# Patient Record
Sex: Female | Born: 1960 | Race: Black or African American | Hispanic: No | Marital: Married | Smoking: Current some day smoker
Health system: Southern US, Community
[De-identification: ages and names within clinical notes are randomized; demographics above are authoritative.]

## PROBLEM LIST (undated history)

## (undated) DIAGNOSIS — N200 Calculus of kidney: Secondary | ICD-10-CM

## (undated) DIAGNOSIS — I639 Cerebral infarction, unspecified: Secondary | ICD-10-CM

## (undated) DIAGNOSIS — E039 Hypothyroidism, unspecified: Secondary | ICD-10-CM

## (undated) DIAGNOSIS — I509 Heart failure, unspecified: Secondary | ICD-10-CM

## (undated) DIAGNOSIS — Z8489 Family history of other specified conditions: Secondary | ICD-10-CM

## (undated) DIAGNOSIS — F329 Major depressive disorder, single episode, unspecified: Secondary | ICD-10-CM

## (undated) DIAGNOSIS — G43909 Migraine, unspecified, not intractable, without status migrainosus: Secondary | ICD-10-CM

## (undated) DIAGNOSIS — F3289 Other specified depressive episodes: Secondary | ICD-10-CM

## (undated) DIAGNOSIS — M329 Systemic lupus erythematosus, unspecified: Secondary | ICD-10-CM

## (undated) DIAGNOSIS — E7800 Pure hypercholesterolemia, unspecified: Secondary | ICD-10-CM

## (undated) DIAGNOSIS — I10 Essential (primary) hypertension: Secondary | ICD-10-CM

## (undated) HISTORY — DX: Pure hypercholesterolemia, unspecified: E78.00

## (undated) HISTORY — DX: Migraine, unspecified, not intractable, without status migrainosus: G43.909

## (undated) HISTORY — DX: Essential (primary) hypertension: I10

## (undated) HISTORY — DX: Hypothyroidism, unspecified: E03.9

## (undated) HISTORY — DX: Other specified depressive episodes: F32.89

## (undated) HISTORY — DX: Systemic lupus erythematosus, unspecified: M32.9

## (undated) HISTORY — DX: Calculus of kidney: N20.0

## (undated) HISTORY — PX: JOINT REPLACEMENT: SHX530

## (undated) HISTORY — DX: Cerebral infarction, unspecified: I63.9

## (undated) HISTORY — PX: ECTOPIC PREGNANCY SURGERY: SHX613

## (undated) HISTORY — PX: LAPAROSCOPY: SHX197

## (undated) HISTORY — DX: Major depressive disorder, single episode, unspecified: F32.9

## (undated) HISTORY — PX: TONSILLECTOMY: SUR1361

---

## 1983-07-08 HISTORY — PX: TUBAL LIGATION: SHX77

## 1999-01-25 ENCOUNTER — Ambulatory Visit (HOSPITAL_COMMUNITY): Admission: RE | Admit: 1999-01-25 | Discharge: 1999-01-25 | Payer: Self-pay | Admitting: *Deleted

## 1999-09-09 ENCOUNTER — Ambulatory Visit (HOSPITAL_COMMUNITY): Admission: RE | Admit: 1999-09-09 | Discharge: 1999-09-09 | Payer: Self-pay | Admitting: Family Medicine

## 1999-10-06 ENCOUNTER — Emergency Department (HOSPITAL_COMMUNITY): Admission: EM | Admit: 1999-10-06 | Discharge: 1999-10-06 | Payer: Self-pay | Admitting: Emergency Medicine

## 2000-04-27 ENCOUNTER — Encounter: Payer: Self-pay | Admitting: Family Medicine

## 2000-04-27 ENCOUNTER — Ambulatory Visit (HOSPITAL_COMMUNITY): Admission: RE | Admit: 2000-04-27 | Discharge: 2000-04-27 | Payer: Self-pay | Admitting: Family Medicine

## 2000-08-14 ENCOUNTER — Emergency Department (HOSPITAL_COMMUNITY): Admission: EM | Admit: 2000-08-14 | Discharge: 2000-08-14 | Payer: Self-pay | Admitting: Emergency Medicine

## 2000-09-22 ENCOUNTER — Observation Stay (HOSPITAL_COMMUNITY): Admission: RE | Admit: 2000-09-22 | Discharge: 2000-09-22 | Payer: Self-pay | Admitting: Neurological Surgery

## 2000-09-22 ENCOUNTER — Encounter: Payer: Self-pay | Admitting: Neurological Surgery

## 2001-02-23 ENCOUNTER — Encounter: Payer: Self-pay | Admitting: Allergy and Immunology

## 2001-02-23 ENCOUNTER — Encounter: Admission: RE | Admit: 2001-02-23 | Discharge: 2001-02-23 | Payer: Self-pay | Admitting: *Deleted

## 2001-06-25 ENCOUNTER — Other Ambulatory Visit: Admission: RE | Admit: 2001-06-25 | Discharge: 2001-06-25 | Payer: Self-pay | Admitting: Gynecology

## 2001-07-07 HISTORY — PX: LUMBAR DISC SURGERY: SHX700

## 2001-07-09 ENCOUNTER — Encounter: Admission: RE | Admit: 2001-07-09 | Discharge: 2001-07-09 | Payer: Self-pay | Admitting: Endocrinology

## 2001-07-09 ENCOUNTER — Encounter: Payer: Self-pay | Admitting: Endocrinology

## 2002-06-27 ENCOUNTER — Other Ambulatory Visit: Admission: RE | Admit: 2002-06-27 | Discharge: 2002-06-27 | Payer: Self-pay | Admitting: Gynecology

## 2002-07-07 HISTORY — PX: TOTAL ABDOMINAL HYSTERECTOMY: SHX209

## 2002-07-18 ENCOUNTER — Inpatient Hospital Stay (HOSPITAL_COMMUNITY): Admission: RE | Admit: 2002-07-18 | Discharge: 2002-07-20 | Payer: Self-pay | Admitting: Gynecology

## 2002-07-18 ENCOUNTER — Encounter (INDEPENDENT_AMBULATORY_CARE_PROVIDER_SITE_OTHER): Payer: Self-pay | Admitting: Specialist

## 2003-01-08 ENCOUNTER — Emergency Department (HOSPITAL_COMMUNITY): Admission: EM | Admit: 2003-01-08 | Discharge: 2003-01-08 | Payer: Self-pay | Admitting: Emergency Medicine

## 2004-01-11 ENCOUNTER — Emergency Department (HOSPITAL_COMMUNITY): Admission: EM | Admit: 2004-01-11 | Discharge: 2004-01-11 | Payer: Self-pay | Admitting: Emergency Medicine

## 2004-01-17 ENCOUNTER — Encounter: Admission: RE | Admit: 2004-01-17 | Discharge: 2004-04-16 | Payer: Self-pay | Admitting: Neurology

## 2005-11-04 ENCOUNTER — Other Ambulatory Visit: Admission: RE | Admit: 2005-11-04 | Discharge: 2005-11-04 | Payer: Self-pay | Admitting: Family Medicine

## 2006-01-15 ENCOUNTER — Ambulatory Visit: Payer: Self-pay | Admitting: Family Medicine

## 2006-02-02 ENCOUNTER — Ambulatory Visit: Payer: Self-pay | Admitting: Family Medicine

## 2006-03-12 ENCOUNTER — Ambulatory Visit: Payer: Self-pay | Admitting: Family Medicine

## 2006-04-10 ENCOUNTER — Ambulatory Visit: Payer: Self-pay | Admitting: Family Medicine

## 2006-04-23 ENCOUNTER — Ambulatory Visit: Payer: Self-pay | Admitting: Family Medicine

## 2006-04-27 ENCOUNTER — Encounter: Admission: RE | Admit: 2006-04-27 | Discharge: 2006-04-27 | Payer: Self-pay | Admitting: Family Medicine

## 2006-05-27 ENCOUNTER — Encounter (INDEPENDENT_AMBULATORY_CARE_PROVIDER_SITE_OTHER): Payer: Self-pay | Admitting: Specialist

## 2006-05-27 ENCOUNTER — Ambulatory Visit (HOSPITAL_COMMUNITY): Admission: RE | Admit: 2006-05-27 | Discharge: 2006-05-27 | Payer: Self-pay | Admitting: *Deleted

## 2006-06-02 ENCOUNTER — Ambulatory Visit: Payer: Self-pay | Admitting: Family Medicine

## 2006-06-09 ENCOUNTER — Ambulatory Visit: Payer: Self-pay | Admitting: Family Medicine

## 2006-07-20 ENCOUNTER — Encounter: Admission: RE | Admit: 2006-07-20 | Discharge: 2006-07-20 | Payer: Self-pay | Admitting: Family Medicine

## 2006-07-20 ENCOUNTER — Ambulatory Visit: Payer: Self-pay | Admitting: Family Medicine

## 2006-07-28 ENCOUNTER — Ambulatory Visit: Payer: Self-pay | Admitting: Family Medicine

## 2006-08-06 ENCOUNTER — Ambulatory Visit: Payer: Self-pay | Admitting: Family Medicine

## 2006-09-22 ENCOUNTER — Ambulatory Visit: Payer: Self-pay | Admitting: Family Medicine

## 2006-10-01 ENCOUNTER — Ambulatory Visit: Payer: Self-pay | Admitting: Family Medicine

## 2006-11-19 ENCOUNTER — Ambulatory Visit: Payer: Self-pay | Admitting: Family Medicine

## 2007-03-04 ENCOUNTER — Emergency Department (HOSPITAL_COMMUNITY): Admission: EM | Admit: 2007-03-04 | Discharge: 2007-03-04 | Payer: Self-pay | Admitting: Emergency Medicine

## 2007-03-09 ENCOUNTER — Ambulatory Visit: Payer: Self-pay | Admitting: Family Medicine

## 2007-04-13 ENCOUNTER — Ambulatory Visit: Payer: Self-pay | Admitting: Family Medicine

## 2007-04-14 ENCOUNTER — Encounter: Admission: RE | Admit: 2007-04-14 | Discharge: 2007-04-14 | Payer: Self-pay | Admitting: Family Medicine

## 2007-05-31 ENCOUNTER — Ambulatory Visit: Payer: Self-pay | Admitting: Family Medicine

## 2007-10-06 ENCOUNTER — Ambulatory Visit: Payer: Self-pay | Admitting: Family Medicine

## 2007-11-22 ENCOUNTER — Emergency Department (HOSPITAL_COMMUNITY): Admission: EM | Admit: 2007-11-22 | Discharge: 2007-11-23 | Payer: Self-pay | Admitting: Emergency Medicine

## 2007-12-06 ENCOUNTER — Ambulatory Visit: Payer: Self-pay | Admitting: Family Medicine

## 2008-02-15 ENCOUNTER — Ambulatory Visit: Payer: Self-pay | Admitting: Family Medicine

## 2008-05-01 ENCOUNTER — Ambulatory Visit: Payer: Self-pay | Admitting: Family Medicine

## 2008-05-31 ENCOUNTER — Other Ambulatory Visit: Admission: RE | Admit: 2008-05-31 | Discharge: 2008-05-31 | Payer: Self-pay | Admitting: Obstetrics and Gynecology

## 2008-06-09 ENCOUNTER — Encounter: Admission: RE | Admit: 2008-06-09 | Discharge: 2008-06-09 | Payer: Self-pay | Admitting: Obstetrics and Gynecology

## 2008-08-08 ENCOUNTER — Ambulatory Visit: Payer: Self-pay | Admitting: Family Medicine

## 2008-10-03 ENCOUNTER — Ambulatory Visit: Payer: Self-pay | Admitting: Family Medicine

## 2008-10-31 ENCOUNTER — Ambulatory Visit: Payer: Self-pay | Admitting: Family Medicine

## 2009-03-20 ENCOUNTER — Ambulatory Visit: Payer: Self-pay | Admitting: Family Medicine

## 2009-03-22 ENCOUNTER — Encounter: Admission: RE | Admit: 2009-03-22 | Discharge: 2009-03-22 | Payer: Self-pay | Admitting: Family Medicine

## 2009-03-27 LAB — HM COLONOSCOPY: HM Colonoscopy: NEGATIVE

## 2009-04-02 ENCOUNTER — Encounter: Admission: RE | Admit: 2009-04-02 | Discharge: 2009-04-02 | Payer: Self-pay | Admitting: Endocrinology

## 2009-04-17 ENCOUNTER — Ambulatory Visit: Payer: Self-pay | Admitting: Family Medicine

## 2009-04-24 ENCOUNTER — Ambulatory Visit: Payer: Self-pay | Admitting: Family Medicine

## 2009-05-30 ENCOUNTER — Ambulatory Visit: Payer: Self-pay | Admitting: Family Medicine

## 2009-06-11 ENCOUNTER — Encounter: Admission: RE | Admit: 2009-06-11 | Discharge: 2009-06-11 | Payer: Self-pay | Admitting: Endocrinology

## 2009-06-21 ENCOUNTER — Ambulatory Visit (HOSPITAL_COMMUNITY): Admission: RE | Admit: 2009-06-21 | Discharge: 2009-06-21 | Payer: Self-pay | Admitting: Endocrinology

## 2009-07-16 ENCOUNTER — Encounter: Admission: RE | Admit: 2009-07-16 | Discharge: 2009-07-16 | Payer: Self-pay | Admitting: Obstetrics and Gynecology

## 2009-07-16 LAB — HM MAMMOGRAPHY: HM Mammogram: NEGATIVE

## 2009-09-15 ENCOUNTER — Emergency Department (HOSPITAL_COMMUNITY): Admission: EM | Admit: 2009-09-15 | Discharge: 2009-09-16 | Payer: Self-pay | Admitting: Emergency Medicine

## 2010-05-02 ENCOUNTER — Ambulatory Visit: Payer: Self-pay | Admitting: Family Medicine

## 2010-07-19 ENCOUNTER — Encounter
Admission: RE | Admit: 2010-07-19 | Discharge: 2010-07-19 | Payer: Self-pay | Source: Home / Self Care | Attending: Endocrinology | Admitting: Endocrinology

## 2010-10-26 ENCOUNTER — Encounter: Payer: Self-pay | Admitting: Family Medicine

## 2010-11-22 NOTE — H&P (Signed)
pNAMEANIAS, CUTCHIN                         ACCOUNT NO.:  [id number]   MEDICAL RECORD NO.:  [id number]                   PATIENT TYPE:   LOCATION:                                       FACILITY:  [facility]   PHYSICIAN:  Juan H. Toney Rakes, M.D.             DATE OF BIRTH:   DATE OF ADMISSION:  07/18/2002  DATE OF DISCHARGE:                                HISTORY & PHYSICAL   CHIEF COMPLAINT:  Menorrhagia and pelvic pain.   HISTORY:  The patient is a 50 year old gravida 61, para 1, Ab3, who was seen  in the office for annual gynecological examination on December 22nd, but she  had been complaining of and seen before on numerous occasions secondary to  complaints of longstanding history of dysmenorrhea and menorrhagia with  periods lasting up to 10 days.  She has had a trial of different  nonsteroidals in the past as well as narcotics without resolution of her  symptoms.  She frequently feels bloated and at times, she has complained of  dyspareunia, but no postcoital bleeding was reported.  She has had one  cesarean section and has had two ectopic pregnancies and one D&C; one of the  ectopics had resulted in a right cornual resection with salpingectomy and  she had a sterilization procedure of the left remaining tube.  She had a  recent endometrial biopsy with no evidence of hyperplasia and she has been  under the care of Dr. Viona Gilmore. Fabiola Backer for her hypothyroidism.  When she was seen  on December 22nd, she was complaining of yellowish-green discharge which  turned out to be trichomoniasis and subsequently was treated with Flagyl;  she also had GC and Chlamydia cultures which were negative.  On followup  visit for her preop appointment on January 5th, she had a test of cure with  a wet prep essentially unremarkable.   ALLERGIES:  She denies any allergies with the exception of questionable IVP  DYE, BETADINE.   PAST MEDICAL HISTORY:  She has had two ectopic pregnancies, both on the  right,  and had one D&C.  One of the ectopics resulted in the right cornual  resection with salpingectomy.  She also has had a tubal sterilization on the  left.  She has had a prior cesarean section.  She has history of  hypothyroidism for which she is under the care Dr. Wilson Singer.  She is on  Synthroid 0.1 mg q.d., recently tested by Dr. Wilson Singer; according to patient,  it was normal.  She also had an HIV and RPR at the time that she had the  positive Trichomonas and they were negative.   REVIEW OF SYSTEMS:  Nothing unusual with the exception of the items that  were described above.  She did have some form of back surgery in 2001.   PHYSICAL EXAMINATION:  VITAL SIGNS:  The patient weighs 154 pounds.  Blood  pressure 120/76.  HEENT:  Unremarkable.  NECK:  Neck supple.  Trachea midline.  No carotid bruits.  No thyromegaly.  LUNGS:  Lungs are clear to auscultation without rhonchi or wheezes.  HEART:  Regular rate and rhythm.  No murmurs or gallops.  BREASTS:  Exam done at the time of the annual exam was reported to be  normal.  ABDOMEN:  Abdomen was soft and nontender without rebound or guarding.  PELVIC:  Bartholin's, urethra and Skene glands are within normal limits.  Vagina and cervix:  No gross lesions on inspection.  On followup visit,  uterus upper limits of normal, approximately 10 weeks' size.  No palpable  adnexal masses.  RECTAL:  Patient flat out refused.   ACCESSORY CLINICAL DATA:  Last Pap smear in December of 2003 was reported to  be normal.  The patient has been very adamant on screening mammogram.   ASSESSMENT:  [age]year-old gravida 4, para 1, abortus 3, with  progressive dysmenorrhea and menorrhagia unresponsive to medical therapy,  requesting permanent corrective treatment.  We discussed abdominal  hysterectomy with ovarian conservation.  This approach was due to the fact  that she has had a cornual resection in the past and also due to her two  ectopic pregnancies and  cesarean section.  Risks, benefits, pros and cons of  the operation discussed are the following:  The risk of infection for which  she will receive prophylactic antibiotic, the risk of hemorrhage with  possible need for blood and blood products with its potential risk of  anaphylactic reaction, hepatitis and acquired immunodeficiency syndrome were  discussed, also a potential risk of deep venous thrombosis and a subsequent  pulmonary embolism; also intraoperatively, complications such as bladder  laceration, trauma to internal organs or blood vessels with need for  corrective surgery were discussed.  In the event of any suspicious lesion or  involvement of an adnexal structure, we may remove one or both ovaries,  depending on findings at time of surgery.  In the event of a malignancy, a  staging procedure may need to be performed at a later date.  All of this  above were discussed with the patient.  She is also aware that although the  dysmenorrhea and menorrhagia may improve, the lower abdominal bloating and  pain may still persist despite her surgery.  She is fully aware of this and  consciously wants to proceed with the above procedure.  All risks were  outlined and all questions were answered.   PLAN:  Patient is scheduled for total abdominal hysterectomy on Monday,  January 12th, at 7:30 a.m. at [facility].  Please have  history and physical available.                                               Juan H. Toney Rakes, M.D.    JHF/MEDQ  D:  07/17/2002  T:  07/18/2002  Job:  [id number]

## 2010-11-22 NOTE — Op Note (Signed)
[place]  Patient:    Anna [name], Anna [name]                       MRN: [id number] Proc. Date: 09/22/00 Adm. Date:  [id number] Attending:  Clearnce Sorrel                           Operative Report  PREOPERATIVE DIAGNOSIS:  Herniated nucleus pulposus at L4-5 right with right lumbar radiculopathy.  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  PROCEDURE:  Right lumbar microendoscopic diskectomy, L4-5, with operating microscope and microdissection technique.  SURGEON:  Earleen Newport, M.D.  FIRST ASSISTANT:  Hosie Spangle, M.D.  ANESTHESIA:  General endotracheal.  INDICATIONS:  The patient is a 50 year old individual who has had significant back and right lower extremity pain.  She has an extruded fragment of disk on the right side at L4-5 that has been refractory to conservative management for a period of six weeks.  She was advised regarding surgical intervention.  DESCRIPTION OF PROCEDURE:  The patient was brought to the operating room supine on the stretcher.  After the smooth induction of general endotracheal anesthesia, she was placed prone and the back was shaved, prepped with Duraprep, and draped in a sterile fashion.  The fluoroscopy unit was then brought into the field to mark the L4-5 interspace in the AP plane, and then the lateral projection a similar space was identified.  The area of the skin over the right side was infiltrated with 1% lidocaine and epinephrine for a total of 3 cc.  A small vertical incision was made in this area measuring 15 mm in length and then a K-wire was passed through the laminar arch of L4. Then using a wanding technique, a series of dilators was placed over the K-wire to the 18 mm diameter.  An 18 mm x 5 cm deep cannula was then inserted over the L4-5 interspace on the right side and locked to the table frame.  The operating microscope was  then draped and brought into the field, and the soft tissues overlying the laminar arch were cleared with the monopolar cautery and Anspach drill, and a 2.8 mm dissecting tool was used to remove the inferior margin of the lamina of L4 out to the mesial wall of the facet.  Redundant yellow ligament in this area was taken up with 2 and 3 mm Kerrison punch.  The common dural tube was exposed, and the takeoff of the L5 nerve root was noted to be bowed dorsally.  The epidural veins in this area were dissected and cauterized using microdissection technique and then by gently mobilizing the sac and the L5 nerve root, the underlying mass was uncovered.  This was found to be a rather large, singular extruded fragment of disk, which was removed. Once this was removed, there was immediate decompression of the area of the nerve root.  The nerve was sounded out distally out into the foramen, and no other fragments of disk were identified.  The annular ligament was felt to be intact, and there was a small rent medially.  This was not explored, and the area around this area was cauterized with the bipolar cautery.  The decompression thus being completed, the area was irrigated copiously with antibiotic irrigating solution.  Then the endoscopic cannula was removed and fascia was closed with 3-0 Vicryl in interrupted fashion,  with 3-0 Vicryl in the subcuticular and skin.  The patient tolerated the procedure well and returned to the recovery room in stable condition. DD:  09/22/00 TD:  09/22/00 Job: [id number]

## 2010-11-22 NOTE — Op Note (Signed)
Anna [name], Anna [name]                        ACCOUNT NO.:  [id number]   MEDICAL RECORD NO.:  [id number]                   PATIENT TYPE:  INP   LOCATION:  9399                                 FACILITY:  [place]   PHYSICIAN:  [place]. Toney Rakes, M.D.             DATE OF BIRTH:  September 04, 1960   DATE OF PROCEDURE:  07/18/2002  DATE OF DISCHARGE:                                 OPERATIVE REPORT   INDICATIONS FOR PROCEDURE:  A 50 year old female with previous history of  two ectopic pregnancies resulting in formal resection of the right fallopian  tube and also a transection of the left fallopian tube, previous cesarean  section and one D&C. A patient with chronic dysmenorrhea, menorrhagia and  dyspareunia.   PREOPERATIVE DIAGNOSES:  1. Chronic pelvic pain.  2. Dysmenorrhea.  3. Menorrhagia.   POSTOPERATIVE DIAGNOSES:  1. Chronic pelvic pain.  2. Dysmenorrhea.  3. Menorrhagia.   ANESTHESIA:  General endotracheal anesthesia.   SURGEON:  Juan H. Toney Rakes, M.D.   FIRST ASSISTANT:  Rodolph Bong, M.D.   PROCEDURE:  Total abdominal hysterectomy with lysis of pelvic adhesions.   FINDINGS:  1. Previous right salpingectomy.  2. Previous segmental resection of left fallopian tube.  3. Normal appearing ovaries.  4. Pelvic adhesions.   DESCRIPTION OF PROCEDURE:  After the patient was adequately counseled, she  was taken to the operating room where she underwent a successful general  endotracheal anesthesia. Her abdomen and vagina were prepped and draped in  the usual sterile fashion. The patient received a gram of Cefotan  prophylactically and she had pneumatic compression stockings for DVT  prophylaxis. After the abdomen was prepped and draped in the usual sterile  fashion and the drapes were in place, a Pfannenstiel skin incision was made  over the area of previous Pfannenstiel incision. The incision was carried  down from the skin and subcutaneous tissue down to the rectus fascia  and  divided in the midline. A nick was made, the midline raphe was entered and  the peritoneal cavity was entered cautiously. The patient was placed in  slight Trendelenburg. O'Connor-O'Sullivan retractors were in place. Pelvic  inspection demonstrated some pelvic adhesions from the previous right  salpingectomy which were meticulously lysed otherwise no evidence of  endometriosis or any other abnormality. The right round ligament was  identified and was suture ligated with #0 Vicryl suture at its distal  portion and it was transected whereby the anterior broad ligament was  incised to the level of the anterior cervical os. The right ureter was  identified with the surgeon's finger and posterior broad ligament was  penetrated and the utero-ovarian ligament was clamped, cut and suture  ligated with #0 suture thus leaving the right ovary in place. After  meticulous dissection of the perimetrium a similar procedure was carried on  the contralateral side. The cardinal and broad ligament was serially  clamped, cut and suture ligated with #  0 Vicryl suture to the level both  lateral fornices which were cut and suture ligated with #0 Vicryl suture and  the angles were kept under tension with Kelly clamps. Compression with  hemostats. The remainder of the cervix was then removed from the vagina and  passed off the operative fields, weight was 200 grams.  Both angles were  secured with #0 Vicryl suture and the remaining cuff was secured with a  transfixation #0 suture. Both ovaries were then suspended with #0 Vicryl  sutures to the round ligament. The pelvic cavity was copiously irrigated  with normal saline solution. The sponge, needle and instrument counts were  correct. The O'Connor-O'Sullivan retractors removed. The visceral peritoneum  was not reapproximated and the rectus fascia was closed with #0 Vicryl  suture. The subcutaneous bleeders were both cauterized and the skin was  reapproximated  with staples. A Xeroform gauze was placed and the patient was  extubated and transferred to the recovery room with stable vital signs.  Blood loss for the procedure was 20 cc, IV fluid was 2 liters of lactated  Ringer's, urine output was 120 cc.                                               Juan H. Toney Rakes, M.D.    JHF/MEDQ  D:  07/18/2002  T:  07/18/2002  Job:  [id number]

## 2010-11-22 NOTE — Discharge Summary (Signed)
   Anna [name], Anna [name]                        ACCOUNT NO.:  [id number]   MEDICAL RECORD NO.:  [id number]                   PATIENT TYPE:  INP   LOCATION:  9309                                 FACILITY:  [place]   PHYSICIAN:  Arne Cleveland, P.A.              DATE OF BIRTH:  Aug 17, 1960   DATE OF ADMISSION:  07/18/2002  DATE OF DISCHARGE:  07/20/2002                                 DISCHARGE SUMMARY   DISCHARGE DIAGNOSES:  1. Chronic pelvic pain.  2. Dysmenorrhea.  3. Menorrhagia.  4. Status post total abdominal hysterectomy with lysis of pelvic adhesions     by Dr. Uvaldo Rising on July 18, 2002.   HISTORY:  A 41-years-of-age female gravida 4 para 1 aborta 3 who complained  of increasing amount of chronic pelvic pain, dysmenorrhea, and menorrhagia.  She had had a trial of different nonsteroidals in the past as well as  narcotics without resolution of her symptoms.  She had a history of one  prior cesarean section, two ectopic pregnancies, and one D&C, and a prior  tubal ligation.   HOSPITAL COURSE:  On July 18, 2002 the patient was admitted and underwent  a total abdominal hysterectomy with lysis of adhesions by Dr. Uvaldo Rising.  There were no complications.  Postoperatively the patient  remained afebrile, voiding, in stable condition, and on July 20, 2002 was  felt in satisfactory condition and was discharged home.   DISCHARGE INSTRUCTIONS:  Given [facility] Gynecology instructions.   ACCESSORY CLINICAL FINDINGS/LABORATORY DATA:  On July 19, 2002 hemoglobin  11.4.   DISPOSITION:  The patient is discharged to home on July 20, 2002.   MEDICATIONS:  Prescription for Tylox p.r.n. pain.   FOLLOW-UP:  She was to follow up in the next week to remove her staples.  If  she had any problem prior to that time to be seen in the office.                                               Arne Cleveland, P.A.    TSG/MEDQ  D:  08/16/2002  T:  08/16/2002  Job:   [id number]

## 2010-11-22 NOTE — Op Note (Signed)
NAMETEE, NEVE              ACCOUNT NO.:  [id number]   MEDICAL RECORD NO.:  [id number]          PATIENT TYPE:  AMB   LOCATION:  ENDO                         FACILITY:  [place]   PHYSICIAN:  Waverly Ferrari, M.D.    DATE OF BIRTH:  May 08, 1961   DATE OF PROCEDURE:  DATE OF DISCHARGE:                                 OPERATIVE REPORT   PROCEDURE:  Upper endoscopy.   INDICATIONS:  See previous clinical notes dictated.   ANESTHESIA:  Fentanyl 75 mcg, Versed 6 mg.   PROCEDURE:  With the patient mildly sedated in the left lateral decubitus  position, the Olympus videoscopic endoscope was inserted in the mouth and  passed under direct vision through the esophagus which appeared normal.  There was no evidence of Barrett's esophagus.  We entered into the stomach,  and the fundus, body, antrum, duodenal bulb, second portion of the duodenum  were visualized.  From this point the endoscope was slowly withdrawn taking  circumferential views of duodenal mucosa until the endoscope had been pulled  back into the stomach,  placed in retroflexion to view the stomach from  below.  The endoscope was then straightened and withdrawn taking  circumferential views of the remaining gastric and esophageal mucosa,  stopping in the antrum where a fairly mild minimal erythema was seen,  photographed and biopsied.  The patient's vital signs, pulse oximetry  remained stable.  The patient tolerated the procedure well without apparent  complications.   FINDINGS:  Minimal erythema of antrum, biopsied.  Await biopsy report.  The  patient will call me for results and follow-up with me as an outpatient.   ASSESSMENT:  Assessment at this point this is essentially a very benign-  appearing endoscopic examination with certainly no evidence of cause for the  patient to have hematemesis as the patient reports and clearly and no  evidence of any disabling illness.           ______________________________  Waverly Ferrari, M.D.     GMO/MEDQ  D:  05/27/2006  T:  05/27/2006  Job:  [id number]   cc:   Jill Alexanders, M.D.

## 2010-11-27 ENCOUNTER — Encounter: Payer: Self-pay | Admitting: Family Medicine

## 2010-11-27 ENCOUNTER — Ambulatory Visit (INDEPENDENT_AMBULATORY_CARE_PROVIDER_SITE_OTHER): Payer: BC Managed Care – PPO | Admitting: Family Medicine

## 2010-11-27 VITALS — BP 110/70 | HR 72 | Wt 158.0 lb

## 2010-11-27 DIAGNOSIS — E039 Hypothyroidism, unspecified: Secondary | ICD-10-CM | POA: Insufficient documentation

## 2010-11-27 DIAGNOSIS — M329 Systemic lupus erythematosus, unspecified: Secondary | ICD-10-CM | POA: Insufficient documentation

## 2010-11-27 DIAGNOSIS — F32A Depression, unspecified: Secondary | ICD-10-CM | POA: Insufficient documentation

## 2010-11-27 DIAGNOSIS — E785 Hyperlipidemia, unspecified: Secondary | ICD-10-CM | POA: Insufficient documentation

## 2010-11-27 DIAGNOSIS — Z79899 Other long term (current) drug therapy: Secondary | ICD-10-CM | POA: Insufficient documentation

## 2010-11-27 DIAGNOSIS — F329 Major depressive disorder, single episode, unspecified: Secondary | ICD-10-CM

## 2010-11-27 LAB — CBC WITH DIFFERENTIAL/PLATELET
Basophils Absolute: 0 10*3/uL (ref 0.0–0.1)
Eosinophils Relative: 2 % (ref 0–5)
Lymphocytes Relative: 44 % (ref 12–46)
Neutro Abs: 2.1 10*3/uL (ref 1.7–7.7)
Neutrophils Relative %: 44 % (ref 43–77)
Platelets: 329 10*3/uL (ref 150–400)
RDW: 15.3 % (ref 11.5–15.5)
WBC: 4.7 10*3/uL (ref 4.0–10.5)

## 2010-11-27 LAB — COMPREHENSIVE METABOLIC PANEL
CO2: 22 mEq/L (ref 19–32)
Glucose, Bld: 92 mg/dL (ref 70–99)
Sodium: 141 mEq/L (ref 135–145)
Total Bilirubin: 0.2 mg/dL — ABNORMAL LOW (ref 0.3–1.2)
Total Protein: 6.6 g/dL (ref 6.0–8.3)

## 2010-11-27 LAB — LIPID PANEL
Cholesterol: 200 mg/dL (ref 0–200)
HDL: 41 mg/dL (ref >39)
Triglycerides: 90 mg/dL (ref <150)

## 2010-11-27 NOTE — Patient Instructions (Addendum)
Call your rheumatologist concerning your lupus flare. We'll work on smoking the next time you come in. Stay on your other medications

## 2010-11-27 NOTE — Progress Notes (Signed)
  Subjective:    Patient ID: Anna [name], female    DOB: 1960/11/25, 50 y.o.   MRN: [id number]  HPI she is here for a recheck. She continues to be followed by her psychiatrist, Dr. Candis Schatz who is monitoring her psychotropic medications. She has had difficulty recently with SLE and presently is on 5 mg of prednisone. She still is having some swelling especially in her face. She continues on her thyroid medication. She has been under a lot of stress dealing with her underlying medical conditions as well as trying to apply for disability.  Her husband also recently lost his job. She does continue to smoke and is not interested in quitting.    Review of Systems Negative except as above    Objective:   Physical Exam alert and in no distress but tearful. The left side of her face just lateral to the eye is slightly swollen.        Assessment & Plan:  See chronic problem list Encouraged her to call her rheumatologist concerning her SLE. She will continue to be followed by her psychiatrist. Routine blood screening. Followup here in several months.

## 2010-11-28 ENCOUNTER — Telehealth: Payer: Self-pay

## 2010-11-28 LAB — TSH: TSH: 0.732 u[IU]/mL (ref 0.350–4.500)

## 2010-11-28 NOTE — Telephone Encounter (Signed)
Called pt to let her know THS is good but cholesterol isnt sending diet info

## 2010-12-12 ENCOUNTER — Emergency Department (HOSPITAL_COMMUNITY): Payer: BC Managed Care – PPO

## 2010-12-12 ENCOUNTER — Emergency Department (HOSPITAL_COMMUNITY)
Admission: EM | Admit: 2010-12-12 | Discharge: 2010-12-12 | Disposition: A | Discharge status: Home / Self Care | Payer: BC Managed Care – PPO | Attending: Emergency Medicine | Admitting: Emergency Medicine

## 2010-12-12 DIAGNOSIS — R300 Dysuria: Secondary | ICD-10-CM | POA: Insufficient documentation

## 2010-12-12 DIAGNOSIS — R4182 Altered mental status, unspecified: Secondary | ICD-10-CM | POA: Insufficient documentation

## 2010-12-12 DIAGNOSIS — I10 Essential (primary) hypertension: Secondary | ICD-10-CM | POA: Insufficient documentation

## 2010-12-12 DIAGNOSIS — R109 Unspecified abdominal pain: Secondary | ICD-10-CM | POA: Insufficient documentation

## 2010-12-12 DIAGNOSIS — Z8673 Personal history of transient ischemic attack (TIA), and cerebral infarction without residual deficits: Secondary | ICD-10-CM | POA: Insufficient documentation

## 2010-12-12 DIAGNOSIS — R404 Transient alteration of awareness: Secondary | ICD-10-CM | POA: Insufficient documentation

## 2010-12-12 DIAGNOSIS — Z79899 Other long term (current) drug therapy: Secondary | ICD-10-CM | POA: Insufficient documentation

## 2010-12-12 DIAGNOSIS — M329 Systemic lupus erythematosus, unspecified: Secondary | ICD-10-CM | POA: Insufficient documentation

## 2010-12-12 LAB — POCT I-STAT, CHEM 8
BUN: 18 mg/dL (ref 6–23)
Potassium: 4.2 mEq/L (ref 3.5–5.1)
Sodium: 138 mEq/L (ref 135–145)
TCO2: 24 mmol/L (ref 0–100)

## 2010-12-12 LAB — ACETAMINOPHEN LEVEL: Acetaminophen (Tylenol), Serum: <15 ug/mL (ref 10–30)

## 2010-12-12 LAB — DIFFERENTIAL
Basophils Absolute: 0 10*3/uL (ref 0.0–0.1)
Basophils Relative: 0 % (ref 0–1)
Lymphocytes Relative: 21 % (ref 12–46)
Monocytes Relative: 7 % (ref 3–12)
Neutro Abs: 6.7 10*3/uL (ref 1.7–7.7)
Neutrophils Relative %: 71 % (ref 43–77)

## 2010-12-12 LAB — SALICYLATE LEVEL
Salicylate Lvl: <2 mg/dL — ABNORMAL LOW (ref 2.8–20.0)
Salicylate Lvl: <2 mg/dL — ABNORMAL LOW (ref 2.8–20.0)

## 2010-12-12 LAB — RAPID URINE DRUG SCREEN, HOSP PERFORMED: Barbiturates: NOT DETECTED

## 2010-12-12 LAB — URINALYSIS, ROUTINE W REFLEX MICROSCOPIC
Glucose, UA: NEGATIVE mg/dL
pH: 5.5 (ref 5.0–8.0)

## 2010-12-12 LAB — CBC
Hemoglobin: 14.7 g/dL (ref 12.0–15.0)
MCH: 34.9 pg — ABNORMAL HIGH (ref 26.0–34.0)
RBC: 4.21 MIL/uL (ref 3.87–5.11)

## 2010-12-13 LAB — URINE CULTURE
Colony Count: NO GROWTH
Culture  Setup Time: 201206071228

## 2010-12-25 ENCOUNTER — Ambulatory Visit (INDEPENDENT_AMBULATORY_CARE_PROVIDER_SITE_OTHER): Payer: BC Managed Care – PPO | Admitting: Family Medicine

## 2010-12-25 ENCOUNTER — Encounter: Payer: Self-pay | Admitting: Family Medicine

## 2010-12-25 VITALS — BP 104/70 | HR 84 | Ht 66.5 in | Wt 156.0 lb

## 2010-12-25 DIAGNOSIS — R42 Dizziness and giddiness: Secondary | ICD-10-CM

## 2010-12-25 DIAGNOSIS — I10 Essential (primary) hypertension: Secondary | ICD-10-CM | POA: Insufficient documentation

## 2010-12-25 NOTE — Patient Instructions (Signed)
Decrease your Benicar HCT to 1/2 tablet every day (instead of taking a full tablet).  Make sure you are drinking at least 8 eight ounce glasses of fluid (ie water) daily.  Try and check your blood pressure at pharmacy a few times between now and your next visit (in 2 weeks)

## 2010-12-25 NOTE — Progress Notes (Signed)
Subjective:    Patient ID: Anna [name], female    DOB: October 07, 1960, 50 y.o.   MRN: [id number]  HPI Patient walks in today with BP concerns.  Has been feeling foggy, woozy, like she's going to pass out.  Did faint a few weeks ago while in [employer].  She had ER visit a couple of weeks ago related to somnolence (noted at her rheumatologist visit).  Work-up was normal (other than benzo's on tox screen, she takes chronically), and she was sent home.  Patient states that BP was low (80/50).  She stopped taking the Benicar HCT for 2 days after ER visit, and when she checked BP at pharmacy she recalls that the diastolic was [id number].  She restarted the Benicar HCT at that time, realizing that her BP was too high off the medication.  BP at Dr. Trudie Reed office this morning was 98/66.  She comes directly from Dr. Trudie Reed office.  She states that perhaps she isn't drinking enough fluids.  She's been sweating heavily due to hot flashes/menopausal symptoms.  Past Medical History  Diagnosis Date  . Hypertension   . Hypothyroid   . Lupus (systemic lupus erythematosus)   . Depressive disorder, not elsewhere classified   . Pure hypercholesterolemia   . Kidney stone     Past Surgical History  Procedure Date  . Abdominal hysterectomy   . Cesarean section   . Tonsillectomy   . Lumbar disc surgery 2003  . Ectopic pregnancy surgery     History   Social History  . Marital Status: Married    Spouse Name: N/A    Number of Children: N/A  . Years of Education: N/A   Occupational History  . Not on file.   Social History Main Topics  . Smoking status: Current Everyday Smoker -- 0.5 packs/day for 35 years    Types: Cigarettes  . Smokeless tobacco: Never Used  . Alcohol Use: Yes     maybe 5 drinks per year  . Drug Use: No  . Sexually Active: Not on file   Other Topics Concern  . Not on file   Social History Narrative  . No narrative on file    Family History  Problem Relation Age of Onset  .  Autoimmune disease Neg Hx     Current outpatient prescriptions:carbamazepine (TEGRETOL) 200 MG tablet, Take 200 mg by mouth 3 (three) times daily.  , Disp: , Rfl: ;  diazepam (VALIUM) 5 MG tablet, Take 5 mg by mouth every 6 (six) hours as needed.  , Disp: , Rfl: ;  escitalopram (LEXAPRO) 20 MG tablet, Take 20 mg by mouth daily.  , Disp: , Rfl: ;  hydroxychloroquine (PLAQUENIL) 200 MG tablet, Take 200 mg by mouth 2 (two) times daily.  , Disp: , Rfl:  hydrOXYzine (ATARAX) 10 MG tablet, Take 10 mg by mouth 3 (three) times daily as needed. Once in the am 2 pills at night, Disp: , Rfl: ;  levothyroxine (SYNTHROID, LEVOTHROID) 50 MCG tablet, Take 75 mcg by mouth daily. , Disp: , Rfl: ;  olmesartan-hydrochlorothiazide (BENICAR HCT) 20-12.5 MG per tablet, Take 1 tablet by mouth daily.  , Disp: , Rfl: ;  predniSONE (DELTASONE) 5 MG tablet, Take 5 mg by mouth daily.  , Disp: , Rfl:  QUEtiapine (SEROQUEL) 100 MG tablet, Take 100-150 mg by mouth at bedtime.  , Disp: , Rfl:   No Known Allergies  Review of Systems + hot flashes.  Denies fevers.  Denies URI symptoms,  slight cough from smoking.  Denies any feet swelling.  No chest pain or shortness of breath    Objective:   Physical Exam BP 104/70  Pulse 84  Ht 5' 6.5" (1.689 m)  Wt 156 lb (70.761 kg)  BMI 24.80 kg/m2 Well developed African American female in no distress HEENT: PERRL, EOMI, mucus membranes moist, but lips appear dry Neck:  No lymphadenopathy or masses Heart:  Regular rate and rhythm without murmurs Lungs:  Clear bilaterally Extremities:  No clubbing, cyanosis or edema     Assessment & Plan:   1. Essential hypertension, benign   2. Dizziness    Her symptoms are likely related to low blood pressures, partially related to mild dehydration.  This may be related to the heat of summer, plus her menopausal hot flashes, along with inadequate fluid intake.  She drank 2 large glasses of water prior to leaving the office and felt much  better.  She is to decrease her Benicar HCT to 1/2 tablet daily, increase her fluid intake, and follow-up in 2 weeks for re-check of blood pressure.

## 2011-01-09 ENCOUNTER — Encounter: Payer: Self-pay | Admitting: Family Medicine

## 2011-01-09 ENCOUNTER — Ambulatory Visit (INDEPENDENT_AMBULATORY_CARE_PROVIDER_SITE_OTHER): Payer: BC Managed Care – PPO | Admitting: Family Medicine

## 2011-01-09 VITALS — BP 112/74 | HR 76 | Ht 66.6 in | Wt 159.0 lb

## 2011-01-09 DIAGNOSIS — I10 Essential (primary) hypertension: Secondary | ICD-10-CM

## 2011-01-09 NOTE — Progress Notes (Signed)
Subjective:    Patient ID: Anna [name], female    DOB: 1960/12/24, 50 y.o.   MRN: [id number]  HPI Patient presents for follow up on her blood pressure.  At last visit, she was complaining of a lot of dizziness, and syncope.  We decreased her Benicar HCT to just 1/2 pill daily.  She doesn't check her blood pressure elsewhere, but feels like her blood pressure is doing better.  She still feels "foggy", lightheaded.  She recently saw Dr. Tressia Danas assistant (PA? NP?), and was started on Neurontin. She was told this might help with her hot flashes. She feels like all of her symptoms are related to her menopause.  Has been drinking a lot of water.  Suffers from constipation, but finally had a bowel movement, which wore her out (needed MOM, prune juice--is out of Amitiza until she gets her check)  Has a lot of stress--son's illness (in hospital in [place], with no money for gas to go visit him), financial and marital stress, along with the 100 degree weather and hot flashes.  Drinking plenty of fluids.  Past Medical History  Diagnosis Date  . Hypertension   . Hypothyroid   . Lupus (systemic lupus erythematosus)   . Depressive disorder, not elsewhere classified   . Pure hypercholesterolemia   . Kidney stone     Past Surgical History  Procedure Date  . Abdominal hysterectomy   . Cesarean section   . Tonsillectomy   . Lumbar disc surgery 2003  . Ectopic pregnancy surgery     History   Social History  . Marital Status: Married    Spouse Name: N/A    Number of Children: N/A  . Years of Education: N/A   Occupational History  . Not on file.   Social History Main Topics  . Smoking status: Current Everyday Smoker -- 0.5 packs/day for 35 years    Types: Cigarettes  . Smokeless tobacco: Never Used  . Alcohol Use: Yes     maybe 5 drinks per year  . Drug Use: No  . Sexually Active: Not on file   Other Topics Concern  . Not on file   Social History Narrative  . No narrative on file     Family History  Problem Relation Age of Onset  . Autoimmune disease Neg Hx     Current outpatient prescriptions:AMITIZA 24 MCG capsule, Take 48 mcg by mouth daily with breakfast. , Disp: , Rfl: ;  carbamazepine (TEGRETOL) 200 MG tablet, Take 200 mg by mouth 3 (three) times daily.  , Disp: , Rfl: ;  diazepam (VALIUM) 5 MG tablet, Take 5 mg by mouth every 6 (six) hours as needed.  , Disp: , Rfl: ;  escitalopram (LEXAPRO) 20 MG tablet, Take 20 mg by mouth daily.  , Disp: , Rfl:  gabapentin (NEURONTIN) 300 MG capsule, Take 300 mg by mouth at bedtime. , Disp: , Rfl: ;  hydroxychloroquine (PLAQUENIL) 200 MG tablet, Take 200 mg by mouth 2 (two) times daily.  , Disp: , Rfl: ;  levothyroxine (SYNTHROID) 75 MCG tablet, Take 75 mcg by mouth daily.  , Disp: , Rfl: ;  olmesartan-hydrochlorothiazide (BENICAR HCT) 20-12.5 MG per tablet, Take 0.5 tablets by mouth daily. , Disp: , Rfl:  predniSONE (DELTASONE) 5 MG tablet, Take 5 mg by mouth daily.  , Disp: , Rfl: ;  QUEtiapine (SEROQUEL) 100 MG tablet, Take 100-150 mg by mouth at bedtime.  , Disp: , Rfl: ;  DISCONTD: levothyroxine (SYNTHROID, LEVOTHROID)  50 MCG tablet, Take 75 mcg by mouth daily. , Disp: , Rfl: ;  hydrOXYzine (ATARAX) 10 MG tablet, Take 10 mg by mouth 3 (three) times daily as needed. Once in the am 2 pills at night, Disp: , Rfl:   No Known Allergies  Review of Systems Goiter--sometimes has choking, but doing better as long as she drinks slower.  Has appt scheduled with Dr. Wilson Singer next week.  Slight headache today.  Denies URI symptoms, just slight nasal congestion.  +smoker's cough.  Denies swelling, rash, or other complaints    Objective:   Physical Exam BP 112/74  Pulse 76  Ht 5' 6.6" (1.692 m)  Wt 159 lb (72.122 kg)  BMI 25.20 kg/m2 Well developed, pleasant african [place] female who is clearly distraught today.  She unloaded about all of her stressors and what's going on in her life, and stated she felt better after speaking with me.   She appears somewhat down, but exhibits full range of affect today as well.  Normal eye contact, speech. Neck: Goiter (diffusely, mildly enlarged thyroid without discrete mass), no lymphadenopathy Heart:  Regular rate and rhythm without murmur Lungs: clear bilaterally Extremities: no edema Skin: no rash       Assessment & Plan:   1. Essential hypertension, benign    Continue Benicar HCT at 1/2 tablet.  Encouraged to quit smoking  Continue with counseling/seeing psychiatrist.  Has f/u with Dr. Wilson Singer 7/10 to f/u goiter.  F/u here in 6 months on HTN, sooner prn

## 2011-01-09 NOTE — Patient Instructions (Addendum)
Make sure to let your psychiatrist know that you have been started on the new medication from your neurologist (the Gabapentin).  Continue taking Benicar HCT at HALF-TABLET (not whole pill).  Your blood pressure was very good today.  Continue to drink lots of fluids.   Return here in 6 months, sooner if you continue to have problems/concerns

## 2011-01-14 ENCOUNTER — Other Ambulatory Visit: Payer: Self-pay | Admitting: Endocrinology

## 2011-01-14 DIAGNOSIS — E042 Nontoxic multinodular goiter: Secondary | ICD-10-CM

## 2011-01-18 ENCOUNTER — Other Ambulatory Visit: Payer: Self-pay | Admitting: Family Medicine

## 2011-04-02 LAB — CBC
MCHC: 34.4
MCV: 96.6
Platelets: 280

## 2011-04-02 LAB — POCT I-STAT, CHEM 8
BUN: 14
Creatinine, Ser: 1.1
Glucose, Bld: 98
Hemoglobin: 12.6
Potassium: 3.6

## 2011-04-02 LAB — POCT CARDIAC MARKERS: Myoglobin, poc: 35.7

## 2011-04-02 LAB — DIFFERENTIAL
Basophils Relative: 1
Eosinophils Absolute: 0.1
Monocytes Relative: 7
Neutrophils Relative %: 58

## 2011-04-15 DIAGNOSIS — Z0271 Encounter for disability determination: Secondary | ICD-10-CM

## 2011-04-17 ENCOUNTER — Encounter: Payer: Self-pay | Admitting: Family Medicine

## 2011-04-17 ENCOUNTER — Ambulatory Visit (INDEPENDENT_AMBULATORY_CARE_PROVIDER_SITE_OTHER): Payer: BC Managed Care – PPO | Admitting: Family Medicine

## 2011-04-17 VITALS — BP 132/86 | HR 72 | Temp 97.7°F | Ht 66.0 in | Wt 168.0 lb

## 2011-04-17 DIAGNOSIS — I10 Essential (primary) hypertension: Secondary | ICD-10-CM

## 2011-04-17 DIAGNOSIS — M329 Systemic lupus erythematosus, unspecified: Secondary | ICD-10-CM

## 2011-04-17 DIAGNOSIS — J309 Allergic rhinitis, unspecified: Secondary | ICD-10-CM

## 2011-04-17 DIAGNOSIS — J329 Chronic sinusitis, unspecified: Secondary | ICD-10-CM

## 2011-04-17 MED ORDER — FLUTICASONE PROPIONATE 50 MCG/ACT NA SUSP: 2.0000 | Freq: Every day | NASAL | Status: DC

## 2011-04-17 MED ORDER — OLMESARTAN MEDOXOMIL-HCTZ 20-12.5 MG PO TABS: ORAL_TABLET | ORAL | Status: DC

## 2011-04-17 MED ORDER — AMOXICILLIN 875 MG PO TABS: 875.0000 mg | ORAL_TABLET | Freq: Two times a day (BID) | ORAL | Status: AC

## 2011-04-17 NOTE — Patient Instructions (Signed)
Take all the antibiotics for your sinus infection. Quitting smoking will help prevent future sinus infections Sinus rinses will help flush the sinuses Restart nasal steroids to treat allergies (new prescription sent for Flonase)  Cut back on alcohol intake Start walking every day Talk with Dr. Trudie Reed about pain control

## 2011-04-17 NOTE — Progress Notes (Signed)
Patient presents for f/u hypertension.  BP's at [facility] running 140's/90's.  Was even higher at [facility].  Wondering if machines are accurate. Feeling "fuzzy".  Denies headaches or chest pain.  Has metallic taste in her mouth and coughing up some yellow phlegm for a few weeks.  Some sinus pressure (frontal) at night, and also some postnasal drip. + sick contacts.  Using Robitussin DM with some improvement.  She continues to smoke.  Increased alcohol intake since the weather changed, about 2-3 weeks ago.  Admits to having 2 drinks once daily or every other day, depending on who visits.  Seems to help keep her joint pain under control  Got flu shot at [facility] Son is doing much better Had corneal abrasion and infection (cut self with fingernail)--treated by Dr. Zenia Resides partner, and infection resolved.  Past Medical History  Diagnosis Date  . Hypertension   . Hypothyroid   . Lupus (systemic lupus erythematosus)   . Depressive disorder, not elsewhere classified   . Pure hypercholesterolemia   . Kidney stone     Past Surgical History  Procedure Date  . Abdominal hysterectomy   . Cesarean section   . Tonsillectomy   . Lumbar disc surgery 2003  . Ectopic pregnancy surgery     History   Social History  . Marital Status: Married    Spouse Name: N/A    Number of Children: N/A  . Years of Education: N/A   Occupational History  . Not on file.   Social History Main Topics  . Smoking status: Current Everyday Smoker -- 0.5 packs/day for 35 years    Types: Cigarettes  . Smokeless tobacco: Never Used  . Alcohol Use: Yes     2 drinks, every day or every other day--admits to using to help with her pain  . Drug Use: No  . Sexually Active: Not on file   Other Topics Concern  . Not on file   Social History Narrative  . No narrative on file    Family History  Problem Relation Age of Onset  . Autoimmune disease Neg Hx    Current Outpatient Prescriptions on File Prior to Visit  Medication  Sig Dispense Refill  . BENICAR HCT 20-12.5 MG per tablet TAKE 1 TABLET EVERY DAY  30 tablet  1  . carbamazepine (TEGRETOL) 200 MG tablet Take 200 mg by mouth 3 (three) times daily.        . diazepam (VALIUM) 5 MG tablet Take 5 mg by mouth every 6 (six) hours as needed.        . gabapentin (NEURONTIN) 300 MG capsule Take 300 mg by mouth at bedtime.       . hydroxychloroquine (PLAQUENIL) 200 MG tablet Take 200 mg by mouth 2 (two) times daily.        [name] levothyroxine (SYNTHROID) 75 MCG tablet Take 75 mcg by mouth daily.        . predniSONE (DELTASONE) 5 MG tablet Take 5 mg by mouth daily.        . QUEtiapine (SEROQUEL) 100 MG tablet Take 100-150 mg by mouth at bedtime.        . AMITIZA 24 MCG capsule Take 48 mcg by mouth daily with breakfast.       . hydrOXYzine (ATARAX) 10 MG tablet Take 10 mg by mouth 3 (three) times daily as needed. Once in the am 2 pills at night       No Known Allergies  ROS:  +headaches (just  the sinus, with weather changes); no fevers.  No GI complaints, numbness, tingling, weakness. +joint aches with colder weather.  Denies chest pain, shortness of breath.  9 pound weight gain Having some thyroid issues--is having studies done in December (ultrasound?)  PHYSICAL EXAM: BP 132/86  Pulse 72  Temp(Src) 97.7 F (36.5 C) (Oral)  Ht 5\' 6"  (1.676 m)  Wt 168 lb (76.204 kg)  BMI 27.12 kg/m2 Well developed, pleasant female, in no distress HEENT: PERRL, EOMI, conjunctiva clear.  Mod-severe nasal mucosa edema, with + erythema and yellow crusting.  Sinuses nontender.  Op clear. TM's normal Neck: +goiter, no lymphadenopathy Heart: regular rate and rhythm Lungs: clear bilaterally Abdomen: soft, nontender, no organomegaly or mass Extremities: no edema Skin: no rash  ASSESSMENT/PLAN:  1. Sinusitis  amoxicillin (AMOXIL) 875 MG tablet  2. Allergic rhinitis, cause unspecified  fluticasone (FLONASE) 50 MCG/ACT nasal spray  3. Essential hypertension, benign    4. SLE (systemic  lupus erythematosus)     Sinusitis--encouraged her to quit smoking.  Discussed sinus rinses. HTN--adequately controlled on current regimen Weight gain--Start walking, cut back on alcohol Increasing alcohol use as way of self medicating her increased joint pains related to colder weather.  Discussed risks of smoking, and encouraged her to cut back. F/u with Dr. Trudie Reed next week, and discuss pain control with her

## 2011-04-18 LAB — CBC
HCT: 37.2
MCHC: 34.5
MCV: 96.4
Platelets: 247
RDW: 14.6 — ABNORMAL HIGH

## 2011-04-18 LAB — BASIC METABOLIC PANEL
BUN: 11
CO2: 26
Chloride: 105
GFR calc non Af Amer: >60
Glucose, Bld: 124 — ABNORMAL HIGH
Potassium: 3.4 — ABNORMAL LOW

## 2011-04-18 LAB — URINALYSIS, ROUTINE W REFLEX MICROSCOPIC
Bilirubin Urine: NEGATIVE
Ketones, ur: 15 — AB
Leukocytes, UA: NEGATIVE
Nitrite: NEGATIVE
Protein, ur: 30 — AB

## 2011-04-18 LAB — DIFFERENTIAL
Basophils Absolute: 0
Basophils Relative: 0
Eosinophils Absolute: 0
Eosinophils Relative: 0
Monocytes Absolute: 0.2

## 2011-05-30 ENCOUNTER — Ambulatory Visit: Payer: BC Managed Care – PPO | Admitting: Family Medicine

## 2011-06-04 ENCOUNTER — Telehealth: Payer: Self-pay | Admitting: Family Medicine

## 2011-06-04 NOTE — Telephone Encounter (Signed)
FAXED

## 2011-07-04 ENCOUNTER — Ambulatory Visit
Admission: RE | Admit: 2011-07-04 | Discharge: 2011-07-04 | Disposition: A | Discharge status: Home / Self Care | Payer: BC Managed Care – PPO | Source: Ambulatory Visit | Attending: Endocrinology | Admitting: Endocrinology

## 2011-07-04 DIAGNOSIS — E042 Nontoxic multinodular goiter: Secondary | ICD-10-CM

## 2011-07-14 ENCOUNTER — Ambulatory Visit: Payer: BC Managed Care – PPO | Admitting: Family Medicine

## 2011-07-15 ENCOUNTER — Other Ambulatory Visit: Payer: Self-pay | Admitting: Obstetrics and Gynecology

## 2011-07-15 DIAGNOSIS — N63 Unspecified lump in unspecified breast: Secondary | ICD-10-CM

## 2011-07-25 ENCOUNTER — Other Ambulatory Visit: Payer: BC Managed Care – PPO

## 2011-07-31 ENCOUNTER — Other Ambulatory Visit: Payer: BC Managed Care – PPO

## 2011-08-08 ENCOUNTER — Ambulatory Visit
Admission: RE | Admit: 2011-08-08 | Discharge: 2011-08-08 | Disposition: A | Discharge status: Home / Self Care | Payer: BC Managed Care – PPO | Source: Ambulatory Visit | Attending: Obstetrics and Gynecology | Admitting: Obstetrics and Gynecology

## 2011-08-08 DIAGNOSIS — N63 Unspecified lump in unspecified breast: Secondary | ICD-10-CM

## 2011-08-14 ENCOUNTER — Encounter: Payer: Self-pay | Admitting: Internal Medicine

## 2011-08-20 ENCOUNTER — Encounter: Payer: Self-pay | Admitting: Family Medicine

## 2011-08-20 ENCOUNTER — Ambulatory Visit (INDEPENDENT_AMBULATORY_CARE_PROVIDER_SITE_OTHER): Payer: BC Managed Care – PPO | Admitting: Family Medicine

## 2011-08-20 DIAGNOSIS — M329 Systemic lupus erythematosus, unspecified: Secondary | ICD-10-CM

## 2011-08-20 DIAGNOSIS — I10 Essential (primary) hypertension: Secondary | ICD-10-CM

## 2011-08-20 DIAGNOSIS — E042 Nontoxic multinodular goiter: Secondary | ICD-10-CM

## 2011-08-20 DIAGNOSIS — Z79899 Other long term (current) drug therapy: Secondary | ICD-10-CM

## 2011-08-20 NOTE — Patient Instructions (Signed)
Use Tylenol 2 pills 4 times a day for the pain.

## 2011-08-20 NOTE — Progress Notes (Signed)
  Subjective:    Patient ID: Anna [name], female    DOB: 1961-05-21, 51 y.o.   MRN: [id number]  HPI She is here for an interval evaluation. She saw her rheumatologist today because of increased difficulty from her SLE causing various aches and pains specifically in her shoulders and neck area. Her prednisone was increased. She has been using 2 Tylenol twice per day for the pain and is asking for more pain medication. She continues on other medications listed in the chart. Earlier this year she did have an ultrasound done which did show stable multinodular goiter. She continues on Synthroid. She was seen by neurology in October. That note was reviewed. She continues to be followed by her psychiatrist. She states she has cut back on her alcohol consumption having only one drink per month. She continues to smoke.   Review of Systems Negative except as above    Objective:   Physical Exam alert and in no distress. Tympanic membranes and canals are normal. Throat is clear. Tonsils are normal. Neck is supple without adenopathy or thyromegaly. Cardiac exam shows a regular sinus rhythm without murmurs or gallops. Lungs are clear to auscultation. DTRs are normal.       Assessment & Plan:   1. SLE (systemic lupus erythematosus)   2. Essential hypertension, benign   3. Encounter for long-term (current) use of other medications   4. Multinodular goiter    encouraged her to increase her Tylenol to 2 tablets 4 times per day and continued difficulty with pain, call her rheumatologist. I will order TSH as the other blood work was ordered by her rheumatologist

## 2011-08-21 LAB — TSH: TSH: 0.278 u[IU]/mL — ABNORMAL LOW (ref 0.350–4.500)

## 2011-11-04 ENCOUNTER — Other Ambulatory Visit: Payer: Self-pay | Admitting: Family Medicine

## 2011-12-03 ENCOUNTER — Ambulatory Visit (INDEPENDENT_AMBULATORY_CARE_PROVIDER_SITE_OTHER): Payer: BC Managed Care – PPO | Admitting: Family Medicine

## 2011-12-03 ENCOUNTER — Encounter: Payer: Self-pay | Admitting: Family Medicine

## 2011-12-03 VITALS — BP 122/76 | HR 72 | Temp 98.4°F | Ht 66.0 in | Wt 169.0 lb

## 2011-12-03 DIAGNOSIS — M79602 Pain in left arm: Secondary | ICD-10-CM

## 2011-12-03 DIAGNOSIS — W19XXXA Unspecified fall, initial encounter: Secondary | ICD-10-CM

## 2011-12-03 DIAGNOSIS — M79609 Pain in unspecified limb: Secondary | ICD-10-CM

## 2011-12-03 NOTE — Progress Notes (Signed)
Chief Complaint  Patient presents with  . Arm Pain    fell in yard yesterday while watering rose bushes and trying to spray a feral cat. She fell and rolled down a very steep hill and her left arm from shoulder to wrist is injured. It "clicks" when she tries to turn it.    HPI:  See above--she rolled down the hill and landed on top of L arm.  Has pain shooting down from the L elbow all the way to the hand.  She reports that the forearm felt very warm yesterday.  She made a makeshift sling which she used yesterday.  Has a pulling sensation down the radial aspect of her arm from elbow.  Denies any numbness or tingling.  Denies weakness, just pain.  Took Bayer back and body yesterday and this morning, but it didn't help.  Past Medical History  Diagnosis Date  . Hypertension   . Hypothyroid   . Lupus (systemic lupus erythematosus)   . Depressive disorder, not elsewhere classified   . Pure hypercholesterolemia   . Kidney stone    Past Surgical History  Procedure Date  . Abdominal hysterectomy   . Cesarean section   . Tonsillectomy   . Lumbar disc surgery 2003  . Ectopic pregnancy surgery    History   Social History  . Marital Status: Married    Spouse Name: N/A    Number of Children: N/A  . Years of Education: N/A   Occupational History  . Not on file.   Social History Main Topics  . Smoking status: Current Everyday Smoker -- 1.0 packs/day for 35 years    Types: Cigarettes  . Smokeless tobacco: Never Used  . Alcohol Use: Yes     wine once a month.  . Drug Use: No  . Sexually Active: Not on file   Other Topics Concern  . Not on file   Social History Narrative  . No narrative on file   Current Outpatient Prescriptions on File Prior to Visit  Medication Sig Dispense Refill  . BENICAR HCT 20-12.5 MG per tablet TAKE 1 TABLET EVERY DAY  30 tablet  prn  . carbamazepine (TEGRETOL) 200 MG tablet Take 200 mg by mouth 3 (three) times daily.        . hydroxychloroquine  (PLAQUENIL) 200 MG tablet Take 200 mg by mouth 2 (two) times daily.        . hydrOXYzine (ATARAX) 10 MG tablet Take 10 mg by mouth 3 (three) times daily as needed. Once in the am 2 pills at night      . levothyroxine (SYNTHROID) 75 MCG tablet Take 75 mcg by mouth daily.        . predniSONE (DELTASONE) 5 MG tablet Take 10 mg by mouth daily.       . QUEtiapine (SEROQUEL) 100 MG tablet Take 100-150 mg by mouth at bedtime.        . gabapentin (NEURONTIN) 300 MG capsule Take 300 mg by mouth at bedtime.       Also takes another medication--she didn't bring it and doesn't remember the name, takes it for pain, three times daily.  No Known Allergies  ROS:  Denies fevers, bleeding/bruising, other pain or injuries.  PHYSICAL EXAM: BP 122/76  Pulse 72  Temp(Src) 98.4 F (36.9 C) (Oral)  Ht 5\' 6"  (1.676 m)  Wt 169 lb (76.658 kg)  BMI 27.28 kg/m2 Patient appears in moderate discomfort, holding her left arm with her right.  Very  dramatic and in pain during exam Affect is unusual (using a lot of sarcasm).  Very tender at distal L radius.  No swelling, warmth.  Some pain in medial forearm. nontender at elbow.  Limited ROM of pronation supination, mainly due to pain. 2+ pulse, brisk capillary refill  X-rays--normal without evidence of fracture.  ASSESSMENT/PLAN: 1. Fall  DG Forearm Left  2. Arm pain, left  DG Forearm Left   L wrist and arm pain s/p fall yesterday. Negative x-ray, so likely just bruised/contusion.  Recommend sling--for just short-term use. Follow up here or with ortho if ongoing/worsening pain.  Pt states she has a pain med at home (not on her med list), which she hasn't been able to take today (couldn't open bottle).  Take for pain, and if ineffective, call with name of medication so we can determine what else to use for pain.  Encouraged to quit smoking.

## 2011-12-03 NOTE — Patient Instructions (Signed)
Your x-ray does not show any fracture of your forearm or wrist. I recommend using ice and/or heat to the arm over the next few days.  Don't use anti-inflammatories since you are on prednisone (the combination could give you ulcers).  Tylenol products are okay.  You said you had another pain medication at home.  If that isn't working, call us with the name, and if needed we can change your pain medications.  You may use a sling--but do not use it all day long.  Try and do some range of motion exercises to prevent stiffness.

## 2011-12-12 ENCOUNTER — Ambulatory Visit (INDEPENDENT_AMBULATORY_CARE_PROVIDER_SITE_OTHER): Payer: BC Managed Care – PPO | Admitting: Family Medicine

## 2011-12-12 ENCOUNTER — Encounter: Payer: Self-pay | Admitting: Family Medicine

## 2011-12-12 VITALS — BP 124/80 | HR 81 | Wt 168.0 lb

## 2011-12-12 DIAGNOSIS — M25539 Pain in unspecified wrist: Secondary | ICD-10-CM

## 2011-12-12 DIAGNOSIS — M25531 Pain in right wrist: Secondary | ICD-10-CM

## 2011-12-12 NOTE — Progress Notes (Signed)
  Subjective:    Patient ID: Anna [name], female    DOB: 05/01/1961, 51 y.o.   MRN: [id number]  HPI For recheck. She fell several days ago and continues to have difficulty with left hand discomfort. She states that she has difficulty turning a a steering wheel as well as with wrist rotation. Difficult to get a good history from her.   Review of Systems     Objective:   Physical Exam Exam of the left wrist shows full motion of the wrist. No swelling or deformity noted of the fingers. Good strength. She does complain of pain with palpation between the third and fourth MCP but no lesions were palpable.      Assessment & Plan:  Wrist pain. Recommend Tylenol regularly and is still having difficulty in one or 2 weeks, call me.

## 2012-02-24 ENCOUNTER — Other Ambulatory Visit: Payer: Self-pay | Admitting: Endocrinology

## 2012-02-24 DIAGNOSIS — E041 Nontoxic single thyroid nodule: Secondary | ICD-10-CM

## 2012-03-25 ENCOUNTER — Telehealth: Payer: Self-pay | Admitting: Family Medicine

## 2012-03-25 ENCOUNTER — Other Ambulatory Visit: Payer: Self-pay | Admitting: *Deleted

## 2012-03-25 DIAGNOSIS — E039 Hypothyroidism, unspecified: Secondary | ICD-10-CM

## 2012-03-25 DIAGNOSIS — Z79899 Other long term (current) drug therapy: Secondary | ICD-10-CM

## 2012-03-25 DIAGNOSIS — I10 Essential (primary) hypertension: Secondary | ICD-10-CM

## 2012-03-25 DIAGNOSIS — E785 Hyperlipidemia, unspecified: Secondary | ICD-10-CM

## 2012-03-25 MED ORDER — OLMESARTAN MEDOXOMIL-HCTZ 20-12.5 MG PO TABS: 1.0000 | ORAL_TABLET | Freq: Every day | ORAL | Status: DC

## 2012-03-25 NOTE — Telephone Encounter (Signed)
Left message for patient letting her know that her samples are up front and ready for pick up. Also asked her to please call office and scheduled fasting med check or med check with labs prior(cmet,lipids,TSH) she can schedule with either Dr.Knapp or Dr.Lalonde.

## 2012-03-25 NOTE — Telephone Encounter (Signed)
Okay for samples. Needs to schedule fasting med check (or med check and come for labs prior)--due for c-met, lipids, TSH (labs last done 11/2010). Looks like she has seen both Dr. Redmond School and myself for med checks; can schedule with either

## 2012-03-26 ENCOUNTER — Other Ambulatory Visit: Payer: BC Managed Care – PPO

## 2012-03-26 DIAGNOSIS — I10 Essential (primary) hypertension: Secondary | ICD-10-CM

## 2012-03-26 DIAGNOSIS — E785 Hyperlipidemia, unspecified: Secondary | ICD-10-CM

## 2012-03-26 DIAGNOSIS — E039 Hypothyroidism, unspecified: Secondary | ICD-10-CM

## 2012-03-26 DIAGNOSIS — Z79899 Other long term (current) drug therapy: Secondary | ICD-10-CM

## 2012-03-26 LAB — LIPID PANEL
Cholesterol: 233 mg/dL — ABNORMAL HIGH (ref 0–200)
Total CHOL/HDL Ratio: 7.3 Ratio
VLDL: 25 mg/dL (ref 0–40)

## 2012-03-26 LAB — COMPREHENSIVE METABOLIC PANEL
ALT: 10 U/L (ref 0–35)
AST: 14 U/L (ref 0–37)
Creat: 1.06 mg/dL (ref 0.50–1.10)
Total Bilirubin: 0.4 mg/dL (ref 0.3–1.2)

## 2012-04-05 ENCOUNTER — Ambulatory Visit (INDEPENDENT_AMBULATORY_CARE_PROVIDER_SITE_OTHER): Payer: BC Managed Care – PPO | Admitting: Family Medicine

## 2012-04-05 VITALS — BP 110/72 | HR 84 | Temp 97.9°F | Ht 66.0 in | Wt 165.0 lb

## 2012-04-05 DIAGNOSIS — E039 Hypothyroidism, unspecified: Secondary | ICD-10-CM

## 2012-04-05 DIAGNOSIS — H6120 Impacted cerumen, unspecified ear: Secondary | ICD-10-CM

## 2012-04-05 DIAGNOSIS — I10 Essential (primary) hypertension: Secondary | ICD-10-CM

## 2012-04-05 DIAGNOSIS — E785 Hyperlipidemia, unspecified: Secondary | ICD-10-CM

## 2012-04-05 DIAGNOSIS — Z23 Encounter for immunization: Secondary | ICD-10-CM

## 2012-04-05 NOTE — Patient Instructions (Addendum)
For constipation, I recommend that you take Miralax every day.  Once you start having regular bowel movements, especially if they become frequent and/or too loose, then cut back on the miralax to just 3x/week. Continue to take stool softeners daily (such as Colace, 2 tablets daily) Make sure you drink plenty of water every day (at least 6-8 glasses daily).  Cholesterol Control Diet Cholesterol levels in your body are determined significantly by your diet. Cholesterol levels may also be related to heart disease. The following material helps to explain this relationship and discusses what you can do to help keep your heart healthy. Not all cholesterol is bad. Low-density lipoprotein (LDL) cholesterol is the "bad" cholesterol. It may cause fatty deposits to build up inside your arteries. High-density lipoprotein (HDL) cholesterol is "good." It helps to remove the "bad" LDL cholesterol from your blood. Cholesterol is a very important risk factor for heart disease. Other risk factors are high blood pressure, smoking, stress, heredity, and weight. The heart muscle gets its supply of blood through the coronary arteries. If your LDL cholesterol is high and your HDL cholesterol is low, you are at risk for having fatty deposits build up in your coronary arteries. This leaves less room through which blood can flow. Without sufficient blood and oxygen, the heart muscle cannot function properly and you may feel chest pains (angina pectoris). When a coronary artery closes up entirely, a part of the heart muscle may die, causing a heart attack (myocardial infarction). CHECKING CHOLESTEROL When your caregiver sends your blood to a lab to be analyzed for cholesterol, a complete lipid (fat) profile may be done. With this test, the total amount of cholesterol and levels of LDL and HDL are determined. Triglycerides are a type of fat that circulates in the blood and can also be used to determine heart disease risk. The list  below describes what the numbers should be: Test: Total Cholesterol.  Less than 200 mg/dl.  Test: LDL "bad cholesterol."  Less than 100 mg/dl.   Less than 70 mg/dl if you are at very high risk of a heart attack or sudden cardiac death.  Test: HDL "good cholesterol."  Greater than 50 mg/dl for women.   Greater than 40 mg/dl for men.  Test: Triglycerides.  Less than 150 mg/dl.  CONTROLLING CHOLESTEROL WITH DIET Although exercise and lifestyle factors are important, your diet is key. That is because certain foods are known to raise cholesterol and others to lower it. The goal is to balance foods for their effect on cholesterol and more importantly, to replace saturated and trans fat with other types of fat, such as monounsaturated fat, polyunsaturated fat, and omega-3 fatty acids. On average, a person should consume no more than 15 to 17 g of saturated fat daily. Saturated and trans fats are considered "bad" fats, and they will raise LDL cholesterol. Saturated fats are primarily found in animal products such as meats, butter, and cream. However, that does not mean you need to sacrifice all your favorite foods. Today, there are good tasting, low-fat, low-cholesterol substitutes for most of the things you like to eat. Choose low-fat or nonfat alternatives. Choose round or loin cuts of red meat, since these types of cuts are lowest in fat and cholesterol. Chicken (without the skin), fish, veal, and ground [place] breast are excellent choices. Eliminate fatty meats, such as hot dogs and salami. Even shellfish have little or no saturated fat. Have a 3 oz (85 g) portion when you eat lean meat,  poultry, or fish. Trans fats are also called "partially hydrogenated oils." They are oils that have been scientifically manipulated so that they are solid at room temperature resulting in a longer shelf life and improved taste and texture of foods in which they are added. Trans fats are found in stick margarine, some  tub margarines, cookies, crackers, and baked goods.  When baking and cooking, oils are an excellent substitute for butter. The monounsaturated oils are especially beneficial since it is believed they lower LDL and raise HDL. The oils you should avoid entirely are saturated tropical oils, such as coconut and palm.  Remember to eat liberally from food groups that are naturally free of saturated and trans fat, including fish, fruit, vegetables, beans, grains (barley, rice, couscous, bulgur wheat), and pasta (without cream sauces).  IDENTIFYING FOODS THAT LOWER CHOLESTEROL  Soluble fiber may lower your cholesterol. This type of fiber is found in fruits such as apples, vegetables such as broccoli, potatoes, and carrots, legumes such as beans, peas, and lentils, and grains such as barley. Foods fortified with plant sterols (phytosterol) may also lower cholesterol. You should eat at least 2 g per day of these foods for a cholesterol lowering effect.  Read package labels to identify low-saturated fats, trans fats free, and low-fat foods at the supermarket. Select cheeses that have only 2 to 3 g saturated fat per ounce. Use a heart-healthy tub margarine that is free of trans fats or partially hydrogenated oil. When buying baked goods (cookies, crackers), avoid partially hydrogenated oils. Breads and muffins should be made from whole grains (whole-wheat or whole oat flour, instead of "flour" or "enriched flour"). Buy non-creamy canned soups with reduced salt and no added fats.  FOOD PREPARATION TECHNIQUES  Never deep-fry. If you must fry, either stir-fry, which uses very little fat, or use non-stick cooking sprays. When possible, broil, bake, or roast meats, and steam vegetables. Instead of dressing vegetables with butter or margarine, use lemon and herbs, applesauce and cinnamon (for squash and sweet potatoes), nonfat yogurt, salsa, and low-fat dressings for salads.  LOW-SATURATED FAT / LOW-FAT FOOD  SUBSTITUTES Meats / Saturated Fat (g)  Avoid: Steak, marbled (3 oz/85 g) / 11 g   Choose: Steak, lean (3 oz/85 g) / 4 g   Avoid: Hamburger (3 oz/85 g) / 7 g   Choose: Hamburger, lean (3 oz/85 g) / 5 g   Avoid: Ham (3 oz/85 g) / 6 g   Choose: Ham, lean cut (3 oz/85 g) / 2.4 g   Avoid: Chicken, with skin, dark meat (3 oz/85 g) / 4 g   Choose: Chicken, skin removed, dark meat (3 oz/85 g) / 2 g   Avoid: Chicken, with skin, light meat (3 oz/85 g) / 2.5 g   Choose: Chicken, skin removed, light meat (3 oz/85 g) / 1 g  Dairy / Saturated Fat (g)  Avoid: Whole milk (1 cup) / 5 g   Choose: Low-fat milk, 2% (1 cup) / 3 g   Choose: Low-fat milk, 1% (1 cup) / 1.5 g   Choose: Skim milk (1 cup) / 0.3 g   Avoid: Hard cheese (1 oz/28 g) / 6 g   Choose: Skim milk cheese (1 oz/28 g) / 2 to 3 g   Avoid: Cottage cheese, 4% fat (1 cup) / 6.5 g   Choose: Low-fat cottage cheese, 1% fat (1 cup) / 1.5 g   Avoid: Ice cream (1 cup) / 9 g   Choose: Sherbet (1 cup) /  2.5 g   Choose: Nonfat frozen yogurt (1 cup) / 0.3 g   Choose: Frozen fruit bar / trace   Avoid: Whipped cream (1 tbs) / 3.5 g   Choose: Nondairy whipped topping (1 tbs) / 1 g  Condiments / Saturated Fat (g)  Avoid: Mayonnaise (1 tbs) / 2 g   Choose: Low-fat mayonnaise (1 tbs) / 1 g   Avoid: Butter (1 tbs) / 7 g   Choose: Extra light margarine (1 tbs) / 1 g   Avoid: Coconut oil (1 tbs) / 11.8 g   Choose: Olive oil (1 tbs) / 1.8 g   Choose: Corn oil (1 tbs) / 1.7 g   Choose: Safflower oil (1 tbs) / 1.2 g   Choose: Sunflower oil (1 tbs) / 1.4 g   Choose: Soybean oil (1 tbs) / 2.4 g   Choose: Canola oil (1 tbs) / 1 g  Document Released: 06/23/2005 Document Revised: 06/12/2011 Document Reviewed: 12/12/2010 [facility] Patient Information 2012 [place], [place].  Quitting smoking and getting exercise daily will help raise the HDL  Try Claritin (loratidine) for your allergy symptoms--this might help with some of the  vertigo/dizziness.

## 2012-04-05 NOTE — Progress Notes (Signed)
Chief Complaint  Patient presents with  . Hypertension    med check-labs done last week.   HPI:  HTN:  Patient reports that her BP's have been running low.  It was low when she last saw Dr. Trudie Reed last week.  She reports being on 1/2 tablet of Benicar HCTZ ever since 12/2010 when she was seen for dizziness. (chart apparently never updated with this correction).  Having ongoing dizziness for about 2 weeks, which she describes as more like vertigo than light-headedness.  Has some swelling off and on in her feet.  Feels like something is stuck in her right ear.  Feels is popping.  Cleaned it with peroxide, wondering if there is wax in there. Some intermittent decreased hearing R ear.  Hyperlipidemia--eating a lot of ice cream, burgers  Past Medical History  Diagnosis Date  . Hypertension   . Hypothyroid   . Lupus (systemic lupus erythematosus)   . Depressive disorder, not elsewhere classified   . Pure hypercholesterolemia   . Kidney stone    Past Surgical History  Procedure Date  . Abdominal hysterectomy   . Cesarean section   . Tonsillectomy   . Lumbar disc surgery 2003  . Ectopic pregnancy surgery    History   Social History  . Marital Status: Married    Spouse Name: N/A    Number of Children: N/A  . Years of Education: N/A   Occupational History  . Not on file.   Social History Main Topics  . Smoking status: Current Every Day Smoker -- 1.0 packs/day for 35 years    Types: Cigarettes  . Smokeless tobacco: Never Used  . Alcohol Use: Yes     wine once a month.  . Drug Use: No  . Sexually Active: Not on file   Other Topics Concern  . Not on file   Social History Narrative  . No narrative on file   Current outpatient prescriptions:carbamazepine (TEGRETOL) 200 MG tablet, Take 200 mg by mouth 3 (three) times daily.  , Disp: , Rfl: ;  diazepam (VALIUM) 10 MG tablet, Take 10 mg by mouth 3 (three) times daily., Disp: , Rfl: ;  escitalopram (LEXAPRO) 20 MG tablet, Take 20  mg by mouth daily., Disp: , Rfl: ;  hydroxychloroquine (PLAQUENIL) 200 MG tablet, Take 200 mg by mouth 2 (two) times daily.  , Disp: , Rfl:  levothyroxine (SYNTHROID) 75 MCG tablet, Take 75 mcg by mouth daily. , Disp: , Rfl: ;  methocarbamol (ROBAXIN) 500 MG tablet, Take 500 mg by mouth 3 (three) times daily., Disp: , Rfl: ;  olmesartan-hydrochlorothiazide (BENICAR HCT) 20-12.5 MG per tablet, Take 1 tablet by mouth daily., Disp: 28 tablet, Rfl: 0;  predniSONE (DELTASONE) 5 MG tablet, Take 10 mg by mouth daily. , Disp: , Rfl:  QUEtiapine (SEROQUEL) 100 MG tablet, Take 100-150 mg by mouth at bedtime.  , Disp: , Rfl:   No Known Allergies  ROS:  Denies fevers, sore throat.  +sinus congestion, sneezing. + cough (smoker's cough), phlegm is sometimes clear, other times yellow.  Denies shortness of breath.  Denies nausea or vomiting.  "I can't poop", last about 4-5 days ago, hard stools, despite MOM, prune juice.  She recalls having pneumovax in 2008.  Declines today--will do next year.  Doesn't recall last tetanus, and declines today.  Willing to get flu shot today   PHYSICAL EXAM: BP 98/60  Pulse 84  Temp 97.9 F (36.6 C) (Oral)  Ht 5\' 6"  (1.676 m)  Wt 165 lb (74.844 kg)  BMI 26.63 kg/m2  118/76 RA 110/72 LA HEENT:  PERRL, EOMI, conjunctiva clear.   Cerumen impaction bilaterally--normal TM's after lavage, EAC's normal OP clear Neck: no lymphadenopathy. +thyromegaly Heart: regular rate and rhythm without murmur Lungs: clear bilaterally Abdomen: soft, nontender, no mass Extremities: No edema Skin: Dry skin throughout. No rash/lesions Psych: somewhat unusual behavior.  Normal mood; strange affect--sometimes joking, other times almost threatening  Recent labs:   Chemistry      Component Value Date/Time   NA 134* 03/26/2012 0823   K 4.5 03/26/2012 0823   CL 102 03/26/2012 0823   CO2 26 03/26/2012 0823   BUN 23 03/26/2012 0823   CREATININE 1.06 03/26/2012 0823   CREATININE 1.10 12/12/2010 1136        Component Value Date/Time   CALCIUM 9.5 03/26/2012 0823   ALKPHOS 82 03/26/2012 0823   AST 14 03/26/2012 0823   ALT 10 03/26/2012 0823   BILITOT 0.4 03/26/2012 0823     Glucose 80  Lab Results  Component Value Date   CHOL 233* 03/26/2012   HDL 32* 03/26/2012   LDLCALC 176* 03/26/2012   TRIG 123 03/26/2012   CHOLHDL 7.3 03/26/2012   Lab Results  Component Value Date   TSH 0.438 03/26/2012   ASSESSMENT/PLAN: 1. Need for prophylactic vaccination and inoculation against influenza  Flu vaccine greater than or equal to 3yo preservative free IM  2. Essential hypertension, benign    3. Hypothyroid    4. Hyperlipidemia    5. Cerumen impaction  Ear cerumen removal   bilateral   HTN--BP a little low, normal on repeat.  Not likely the cause of her symptoms, which seem to be more vertigo than lightheadedness. Dizziness--mostly related to vertigo, not hypotension.  I recommended changing over to Benicar (without the HCT) due to constipation and dry skin,(and slightly low sodium) but she reports intermittent swelling and declines changing medication. Repeat BP was okay--continue on 1/2 tablet daily.  Vertigo--having some allergy symptoms.  Recommended trial of claritin. Cerumen impaction bilaterally--s/p lavage with improvement in impaction.  Still had some decreased hearing related to residual fluid after procedure.  Hyperlipidemia--diet reviewed in detail--patient not very receptive to recommendations.  Discussed that dietary changes were just one way of lowering cholesterol, that medications could also be used, if she is unable to make these changes.  Dietary trial for 6 months.  If LDL significantly >130, consider statin.  Smoking--pt not interested in quitting  Hypothyroidism with goiter--adequately replaced.  Continue current meds  Constipation--increase fiber, fluids, use miralax and stool softeners daily   Denies needing any rx's today.  Recently picked up benicar HCT samples  F/u  6 months (fasting med check, or labs prior--lipids needed)

## 2012-06-02 ENCOUNTER — Other Ambulatory Visit: Payer: Self-pay | Admitting: *Deleted

## 2012-06-02 ENCOUNTER — Telehealth: Payer: Self-pay | Admitting: *Deleted

## 2012-06-02 NOTE — Telephone Encounter (Signed)
Dr.Knapp wrote letter and it was given to pt.

## 2012-06-02 NOTE — Telephone Encounter (Signed)
I cannot write a note stating this, as I haven't addressed her limitations from her lupus at any visit.  I can simply write a note stating that she is under the care of Dr. Trudie Reed for her SLE.  (who will be back Monday, to write note for her, if appropriate)

## 2012-06-02 NOTE — Telephone Encounter (Signed)
Patient called and stated that she needs you to write her a note stating that she cannot work due to her lupus. Her lawyer is requesting this letter by end of today. Her neurologist and her therapist, Dustin Flock have written her letters as well and she is picking up today. Her rheumatologist, Gavin Pound is out of the office until December 2nd, that is why she said she is asking you. She states that this letter is for her court date for her disability. I asked when the court date was and she could not give me an answer. I explained to her that you may not be able to provide this letter by the end of the day as she just called this morning and it is the day before a holiday. I told her I would call her and let her know. Thanks.

## 2012-06-16 ENCOUNTER — Ambulatory Visit
Admission: RE | Admit: 2012-06-16 | Discharge: 2012-06-16 | Disposition: A | Discharge status: Home / Self Care | Payer: BC Managed Care – PPO | Source: Ambulatory Visit | Attending: Endocrinology | Admitting: Endocrinology

## 2012-06-16 DIAGNOSIS — E041 Nontoxic single thyroid nodule: Secondary | ICD-10-CM

## 2012-07-16 ENCOUNTER — Other Ambulatory Visit: Payer: Self-pay | Admitting: Obstetrics and Gynecology

## 2012-07-16 DIAGNOSIS — N6320 Unspecified lump in the left breast, unspecified quadrant: Secondary | ICD-10-CM

## 2012-07-26 ENCOUNTER — Ambulatory Visit
Admission: RE | Admit: 2012-07-26 | Discharge: 2012-07-26 | Disposition: A | Discharge status: Home / Self Care | Payer: BC Managed Care – PPO | Source: Ambulatory Visit | Attending: Obstetrics and Gynecology | Admitting: Obstetrics and Gynecology

## 2012-07-26 DIAGNOSIS — N6320 Unspecified lump in the left breast, unspecified quadrant: Secondary | ICD-10-CM

## 2012-09-08 ENCOUNTER — Telehealth: Payer: Self-pay | Admitting: Internal Medicine

## 2012-09-08 NOTE — Telephone Encounter (Signed)
Faxed over medical records to office of disability  Adjudication and review [address]

## 2012-09-16 ENCOUNTER — Other Ambulatory Visit: Payer: Self-pay | Admitting: Family Medicine

## 2012-09-27 ENCOUNTER — Other Ambulatory Visit: Payer: BC Managed Care – PPO

## 2012-09-28 ENCOUNTER — Telehealth: Payer: Self-pay | Admitting: Family Medicine

## 2012-09-28 ENCOUNTER — Other Ambulatory Visit: Payer: BC Managed Care – PPO

## 2012-09-28 DIAGNOSIS — E785 Hyperlipidemia, unspecified: Secondary | ICD-10-CM

## 2012-09-28 NOTE — Telephone Encounter (Signed)
Dr. Tomi Bamberger this patient came in this morning for lab work and there was not any order's in the system. I had Denice Paradise draw the blood. She has extra tubes. There was a note in the computer about Lipids but I just want to make sure you didn't need any other labs on her. Thanks

## 2012-09-28 NOTE — Telephone Encounter (Signed)
Cheri also sent me messaage--just lipids are due.  Thanks for checking

## 2012-09-29 ENCOUNTER — Telehealth: Payer: Self-pay | Admitting: *Deleted

## 2012-09-29 LAB — LIPID PANEL
Cholesterol: 213 mg/dL — ABNORMAL HIGH (ref 0–200)
VLDL: 21 mg/dL (ref 0–40)

## 2012-09-30 ENCOUNTER — Ambulatory Visit (INDEPENDENT_AMBULATORY_CARE_PROVIDER_SITE_OTHER): Payer: BC Managed Care – PPO | Admitting: Family Medicine

## 2012-09-30 ENCOUNTER — Encounter: Payer: Self-pay | Admitting: Family Medicine

## 2012-09-30 VITALS — BP 138/88 | HR 84 | Ht 66.0 in | Wt 171.0 lb

## 2012-09-30 DIAGNOSIS — F17200 Nicotine dependence, unspecified, uncomplicated: Secondary | ICD-10-CM

## 2012-09-30 DIAGNOSIS — E785 Hyperlipidemia, unspecified: Secondary | ICD-10-CM

## 2012-09-30 DIAGNOSIS — E039 Hypothyroidism, unspecified: Secondary | ICD-10-CM

## 2012-09-30 DIAGNOSIS — Z9181 History of falling: Secondary | ICD-10-CM

## 2012-09-30 DIAGNOSIS — I10 Essential (primary) hypertension: Secondary | ICD-10-CM

## 2012-09-30 DIAGNOSIS — R296 Repeated falls: Secondary | ICD-10-CM

## 2012-09-30 NOTE — Patient Instructions (Addendum)
We are referring you to physical therapy to help with balance and reduce falls. I would like to refer you to nutritionist to help with your blood pressure and cholesterol.  Call and let me know if you change your mind--I STRONGLY encourage it.  Here is some information to help you make adjustments in your diet if you are trying it alone.  You need to cut back on fast food, red meats, creamy sauces, dressings, soups, ice cream, butter and cheese.  Eat more fish, chicken/poultry, cook with olive oil instead of butter.  Eat more vegetables.  Use lowfat or nonfat dairy products (milk); try sherbert instead of ice cream (or at least a low fat ice cream, and only small portions).  Cut back on the salt in your diet (sodium--in fast foods, canned foods, etc--see below).  This contributes to elevated blood pressure.  Check blood pressure at pharmacy, and keep a list--bring list to your next visit.  Fat and Cholesterol Control Diet Cholesterol levels in your body are determined significantly by your diet. Cholesterol levels may also be related to heart disease. The following material helps to explain this relationship and discusses what you can do to help keep your heart healthy. Not all cholesterol is bad. Low-density lipoprotein (LDL) cholesterol is the "bad" cholesterol. It may cause fatty deposits to build up inside your arteries. High-density lipoprotein (HDL) cholesterol is "good." It helps to remove the "bad" LDL cholesterol from your blood. Cholesterol is a very important risk factor for heart disease. Other risk factors are high blood pressure, smoking, stress, heredity, and weight. The heart muscle gets its supply of blood through the coronary arteries. If your LDL cholesterol is high and your HDL cholesterol is low, you are at risk for having fatty deposits build up in your coronary arteries. This leaves less room through which blood can flow. Without sufficient blood and oxygen, the heart muscle cannot  function properly and you may feel chest pains (angina pectoris). When a coronary artery closes up entirely, a part of the heart muscle may die causing a heart attack (myocardial infarction). CHECKING CHOLESTEROL When your caregiver sends your blood to a lab to be examined for cholesterol, a complete lipid (fat) profile may be done. With this test, the total amount of cholesterol and levels of LDL and HDL are determined. Triglycerides are a type of fat that circulates in the blood. They can also be used to determine heart disease risk. The list below describes what the numbers should be: Test: Total Cholesterol.  Less than 200 mg/dl. Test: LDL "bad cholesterol."  Less than 100 mg/dl.  Less than 70 mg/dl if you are at very high risk of a heart attack or sudden cardiac death. Test: HDL "good cholesterol."  Greater than 50 mg/dl for women.  Greater than 40 mg/dl for men. Test: Triglycerides.  Less than 150 mg/dl. CONTROLLING CHOLESTEROL WITH DIET Although exercise and lifestyle factors are important, your diet is key. That is because certain foods are known to raise cholesterol and others to lower it. The goal is to balance foods for their effect on cholesterol and more importantly, to replace saturated and trans fat with other types of fat, such as monounsaturated fat, polyunsaturated fat, and omega-3 fatty acids. On average, a person should consume no more than 15 to 17 g of saturated fat daily. Saturated and trans fats are considered "bad" fats, and they will raise LDL cholesterol. Saturated fats are primarily found in animal products such as meats, butter,  and cream. However, that does not mean you need to give up all your favorite foods. Today, there are good tasting, low-fat, low-cholesterol substitutes for most of the things you like to eat. Choose low-fat or nonfat alternatives. Choose round or loin cuts of red meat. These types of cuts are lowest in fat and cholesterol. Chicken (without  the skin), fish, veal, and ground [place] breast are great choices. Eliminate fatty meats, such as hot dogs and salami. Even shellfish have little or no saturated fat. Have a 3 oz (85 g) portion when you eat lean meat, poultry, or fish. Trans fats are also called "partially hydrogenated oils." They are oils that have been scientifically manipulated so that they are solid at room temperature resulting in a longer shelf life and improved taste and texture of foods in which they are added. Trans fats are found in stick margarine, some tub margarines, cookies, crackers, and baked goods.  When baking and cooking, oils are a great substitute for butter. The monounsaturated oils are especially beneficial since it is believed they lower LDL and raise HDL. The oils you should avoid entirely are saturated tropical oils, such as coconut and palm.  Remember to eat a lot from food groups that are naturally free of saturated and trans fat, including fish, fruit, vegetables, beans, grains (barley, rice, couscous, bulgur wheat), and pasta (without cream sauces).  IDENTIFYING FOODS THAT LOWER CHOLESTEROL  Soluble fiber may lower your cholesterol. This type of fiber is found in fruits such as apples, vegetables such as broccoli, potatoes, and carrots, legumes such as beans, peas, and lentils, and grains such as barley. Foods fortified with plant sterols (phytosterol) may also lower cholesterol. You should eat at least 2 g per day of these foods for a cholesterol lowering effect.  Read package labels to identify low-saturated fats, trans fat free, and low-fat foods at the supermarket. Select cheeses that have only 2 to 3 g saturated fat per ounce. Use a heart-healthy tub margarine that is free of trans fats or partially hydrogenated oil. When buying baked goods (cookies, crackers), avoid partially hydrogenated oils. Breads and muffins should be made from whole grains (whole-wheat or whole oat flour, instead of "flour" or  "enriched flour"). Buy non-creamy canned soups with reduced salt and no added fats.  FOOD PREPARATION TECHNIQUES  Never deep-fry. If you must fry, either stir-fry, which uses very little fat, or use non-stick cooking sprays. When possible, broil, bake, or roast meats, and steam vegetables. Instead of putting butter or margarine on vegetables, use lemon and herbs, applesauce, and cinnamon (for squash and sweet potatoes), nonfat yogurt, salsa, and low-fat dressings for salads.  LOW-SATURATED FAT / LOW-FAT FOOD SUBSTITUTES Meats / Saturated Fat (g)  Avoid: Steak, marbled (3 oz/85 g) / 11 g  Choose: Steak, lean (3 oz/85 g) / 4 g  Avoid: Hamburger (3 oz/85 g) / 7 g  Choose: Hamburger, lean (3 oz/85 g) / 5 g  Avoid: Ham (3 oz/85 g) / 6 g  Choose: Ham, lean cut (3 oz/85 g) / 2.4 g  Avoid: Chicken, with skin, dark meat (3 oz/85 g) / 4 g  Choose: Chicken, skin removed, dark meat (3 oz/85 g) / 2 g  Avoid: Chicken, with skin, light meat (3 oz/85 g) / 2.5 g  Choose: Chicken, skin removed, light meat (3 oz/85 g) / 1 g Dairy / Saturated Fat (g)  Avoid: Whole milk (1 cup) / 5 g  Choose: Low-fat milk, 2% (1 cup) / 3  g  Choose: Low-fat milk, 1% (1 cup) / 1.5 g  Choose: Skim milk (1 cup) / 0.3 g  Avoid: Hard cheese (1 oz/28 g) / 6 g  Choose: Skim milk cheese (1 oz/28 g) / 2 to 3 g  Avoid: Cottage cheese, 4% fat (1 cup) / 6.5 g  Choose: Low-fat cottage cheese, 1% fat (1 cup) / 1.5 g  Avoid: Ice cream (1 cup) / 9 g  Choose: Sherbet (1 cup) / 2.5 g  Choose: Nonfat frozen yogurt (1 cup) / 0.3 g  Choose: Frozen fruit bar / trace  Avoid: Whipped cream (1 tbs) / 3.5 g  Choose: Nondairy whipped topping (1 tbs) / 1 g Condiments / Saturated Fat (g)  Avoid: Mayonnaise (1 tbs) / 2 g  Choose: Low-fat mayonnaise (1 tbs) / 1 g  Avoid: Butter (1 tbs) / 7 g  Choose: Extra light margarine (1 tbs) / 1 g  Avoid: Coconut oil (1 tbs) / 11.8 g  Choose: Olive oil (1 tbs) / 1.8 g  Choose:  Corn oil (1 tbs) / 1.7 g  Choose: Safflower oil (1 tbs) / 1.2 g  Choose: Sunflower oil (1 tbs) / 1.4 g  Choose: Soybean oil (1 tbs) / 2.4 g  Choose: Canola oil (1 tbs) / 1 g Document Released: 06/23/2005 Document Revised: 09/15/2011 Document Reviewed: 12/12/2010 [facility] Patient Information 2013 [place], [place].  Sodium-Controlled Diet Sodium is a mineral. It is found in many foods. Sodium may be found naturally or added during the making of a food. The most common form of sodium is salt, which is made up of sodium and chloride. Reducing your sodium intake involves changing your eating habits. The following guidelines will help you reduce the sodium in your diet:  Stop using the salt shaker.  Use salt sparingly in cooking and baking.  Substitute with sodium-free seasonings and spices.  Do not use a salt substitute (potassium chloride) without your caregiver's permission.  Include a variety of fresh, unprocessed foods in your diet.  Limit the use of processed and convenience foods that are high in sodium. USE THE FOLLOWING FOODS SPARINGLY: Breads/Starches  Commercial bread stuffing, commercial pancake or waffle mixes, coating mixes. Waffles. Croutons. Prepared (boxed or frozen) potato, rice, or noodle mixes that contain salt or sodium. Salted [place] fries or hash browns. Salted popcorn, breads, crackers, chips, or snack foods. Vegetables  Vegetables canned with salt or prepared in cream, butter, or cheese sauces. Sauerkraut. Tomato or vegetable juices canned with salt.  Fresh vegetables are allowed if rinsed thoroughly. Fruit  Fruit is okay to eat. Meat and Meat Substitutes  Salted or smoked meats, such as bacon or Canadian bacon, chipped or corned beef, hot dogs, salt pork, luncheon meats, pastrami, ham, or sausage. Canned or smoked fish, poultry, or meat. Processed cheese or cheese spreads, blue or [place] cheese. Battered or frozen fish products. Prepared spaghetti sauce.  Baked beans. Reuben sandwiches. Salted nuts. Caviar. Milk  Limit buttermilk to 1 cup per week. Soups and Combination Foods  Bouillon cubes, canned or dried soups, broth, consomm. Convenience (frozen or packaged) dinners with more than 600 mg sodium. Pot pies, pizza, Asian food, fast food cheeseburgers, and specialty sandwiches. Desserts and Sweets  Regular (salted) desserts, pie, commercial fruit snack pies, commercial snack cakes, canned puddings.  Eat desserts and sweets in moderation. Fats and Oils  Gravy mixes or canned gravy. No more than 1 to 2 tbs of salad dressing. Chip dips.  Eat fats and oils in  moderation. Beverages  See those listed under the vegetables and milk groups. Condiments  Ketchup, mustard, meat sauces, salsa, regular (salted) and lite soy sauce or mustard. Dill pickles, olives, meat tenderizer. Prepared horseradish or pickle relish. Dutch-processed cocoa. Baking powder or baking soda used medicinally. Worcestershire sauce. "Light" salt. Salt substitute, unless approved by your caregiver. Document Released: 12/13/2001 Document Revised: 09/15/2011 Document Reviewed: 07/16/2009 [facility] Patient Information 2013 [place].   Please try and quit smoking--start thinking about why/when you smoke (habit, boredom, stress) in order to come up with effective strategies to cut back or quit. Available resources to help you quit include free counseling through [facility] ([facility]Quitline.[facility] or 1-800-QUITNOW), smoking cessation classes through [facility] (call to find out schedule), over-the-counter nicotine replacements, and e-cigarettes (although this may not help break the hand-mouth habit).  Many insurance companies also have smoking cessation programs (which may decrease the cost of patches, meds if enrolled).  If these methods are not effective for you, and you are motivated to quit, return to discuss the possibility of prescription medications.

## 2012-09-30 NOTE — Progress Notes (Signed)
Chief Complaint  Patient presents with  . Hypertension    med check, labs already done.   HTN:  She has only been taking 1/2 tablet daily since l6/2012.  Doesn't check blood pressure elsewhere.  +headaches.  Denies exertional chest pain.  She had some pain in chest which she felt was related to reflux, and zantac 150mg  helps.  Denies alcohol.  +caffeine 3-4/day or more. Drinks regular sodas, sweet tea.  Gaining weight.  Diet reviewed: Eats [employer] (quarter pounder with cheese and fries) 3-4x/week.  Uses ranch and thousand island dressings; butter on grits; +ice cream Not getting regular exercise, due to issues with falls.  Feels like her balance is off, feeling more wobbly, some vertigo, falling more.  Has a cane, but doesn't always prevent her from falling.  Hypothyroidism--reports compliance with taking meds.  She reports some chronic fatigue (no recent change), constipation (prune juice and colace helps).  Denies hair/skin changes. Some vertigo.  A lot of her meds say that can make her dizzy  Past Medical History  Diagnosis Date  . Hypertension   . Hypothyroid   . Lupus (systemic lupus erythematosus)   . Depressive disorder, not elsewhere classified   . Pure hypercholesterolemia   . Kidney stone    Past Surgical History  Procedure Laterality Date  . Abdominal hysterectomy    . Cesarean section    . Tonsillectomy    . Lumbar disc surgery  2003  . Ectopic pregnancy surgery     History   Social History  . Marital Status: Married    Spouse Name: N/A    Number of Children: N/A  . Years of Education: N/A   Occupational History  . Not on file.   Social History Main Topics  . Smoking status: Current Every Day Smoker -- 1.00 packs/day for 35 years    Types: Cigarettes  . Smokeless tobacco: Never Used  . Alcohol Use: No  . Drug Use: No  . Sexually Active: Not on file   Other Topics Concern  . Not on file   Social History Narrative  . No narrative on file   Current  outpatient prescriptions:BENICAR HCT 20-12.5 MG per tablet, TAKE 1 TABLET EVERY DAY, Disp: 30 tablet, Rfl: 5;  carbamazepine (TEGRETOL) 200 MG tablet, Take 200 mg by mouth 3 (three) times daily.  , Disp: , Rfl: ;  diazepam (VALIUM) 10 MG tablet, Take 10 mg by mouth 3 (three) times daily., Disp: , Rfl: ;  escitalopram (LEXAPRO) 20 MG tablet, Take 20 mg by mouth daily., Disp: , Rfl:  hydroxychloroquine (PLAQUENIL) 200 MG tablet, Take 200 mg by mouth 2 (two) times daily.  , Disp: , Rfl: ;  levothyroxine (SYNTHROID) 75 MCG tablet, Take 75 mcg by mouth daily. , Disp: , Rfl: ;  olmesartan-hydrochlorothiazide (BENICAR HCT) 20-12.5 MG per tablet, Take 1 tablet by mouth daily., Disp: 28 tablet, Rfl: 0;  QUEtiapine (SEROQUEL) 100 MG tablet, Take 100 mg by mouth 3 (three) times daily. , Disp: , Rfl:  methocarbamol (ROBAXIN) 500 MG tablet, Take 500 mg by mouth 3 (three) times daily., Disp: , Rfl: ;  predniSONE (DELTASONE) 5 MG tablet, Take 10 mg by mouth daily. , Disp: , Rfl:  No Known Allergies  ROS:  Denies fevers, URI symptoms, chest pain, shortness of breath, edema, skin rash, bleeding/bruising, nausea, vomiting. +constipation.  Occasional "pull" in her back. Meds have been adjusted for her depression recently, doing okay overall.  Reports not taking prednisone due to weight gain,  only uses as needed for flares of lupus.  Denies current flare  PHYSICAL EXAM: BP 140/100  Pulse 84  Ht 5\' 6"  (1.676 m)  Wt 171 lb (77.565 kg)  BMI 27.61 kg/m2 138/88 on repeat Well developed, pleasant female in no distress HEENT:  PERRL, conjunctiva clear. Neck: no lymphadenopathy. +thyromegaly  Heart: regular rate and rhythm without murmur  Lungs: clear bilaterally  Abdomen: soft, nontender, no mass  Extremities: No edema  Skin: Dry skin throughout. No rash/lesions Psych: normal mood, affect, hygiene, grooming, eye contact.  Lab Results  Component Value Date   CHOL 213* 09/28/2012   HDL 34* 09/28/2012   LDLCALC 158*  09/28/2012   TRIG 106 09/28/2012   CHOLHDL 6.3 09/28/2012   ASSESSMENT/PLAN:  Hyperlipidemia  Essential hypertension, benign  Tobacco use disorder  Falls frequently - feels off balance, some vertigo, and some back pain.  refer to PT - Plan: Ambulatory Referral to [facility]  Hyperlipidemia:  Poor diet. Encouraged nutrition referral. Briefly discussed meds--that they would be needed if significant changes in diet aren't made. She is hesitant about seeing nutritionist, and about meds, wants to "try it on her own".  That is what the last 6 months were about, and although numbers were improved, they are still far above goal.  She declines referral, but will call if she changes her mind.  Discussed the possibility of needing meds to lower cholesterol, discussed diet vs hereditary component (and that even with perfect diet, she may not achieve goal).  HTN--borderline BP today, has been normal on same dose in past.  High sodium in diet--encouraged cutting back on sodium in diet, daily exercise, weight loss.  Falls--willing to get PT  PT REFERRAL--to help with balance, strength, decrease falls. Some vertigo  Smoking--not interested in quitting. Resources provided.  F/u in 3 months with labs prior (lipid, also c-met and TSH, since will be due in September, will do a little early at next visit when fasting) F/u on lipids and blood pressure.  Adjust meds (vs refer to nutritionist if willing at that point) if still not at goal  Declines pneumovax (recommended due to smoking)--readdress at f/u.

## 2012-10-05 NOTE — Telephone Encounter (Signed)
Patient scheduled.

## 2012-12-01 ENCOUNTER — Ambulatory Visit: Payer: BC Managed Care – PPO | Attending: Family Medicine | Admitting: Physical Therapy

## 2012-12-01 ENCOUNTER — Emergency Department (HOSPITAL_COMMUNITY)
Admission: EM | Admit: 2012-12-01 | Discharge: 2012-12-01 | Disposition: A | Discharge status: Home / Self Care | Payer: BC Managed Care – PPO | Attending: Emergency Medicine | Admitting: Emergency Medicine

## 2012-12-01 ENCOUNTER — Telehealth: Payer: Self-pay | Admitting: Family Medicine

## 2012-12-01 ENCOUNTER — Emergency Department (HOSPITAL_COMMUNITY): Payer: BC Managed Care – PPO

## 2012-12-01 DIAGNOSIS — Z7982 Long term (current) use of aspirin: Secondary | ICD-10-CM | POA: Insufficient documentation

## 2012-12-01 DIAGNOSIS — F3289 Other specified depressive episodes: Secondary | ICD-10-CM | POA: Insufficient documentation

## 2012-12-01 DIAGNOSIS — F17200 Nicotine dependence, unspecified, uncomplicated: Secondary | ICD-10-CM | POA: Insufficient documentation

## 2012-12-01 DIAGNOSIS — Z862 Personal history of diseases of the blood and blood-forming organs and certain disorders involving the immune mechanism: Secondary | ICD-10-CM | POA: Insufficient documentation

## 2012-12-01 DIAGNOSIS — Z79899 Other long term (current) drug therapy: Secondary | ICD-10-CM | POA: Insufficient documentation

## 2012-12-01 DIAGNOSIS — Z8639 Personal history of other endocrine, nutritional and metabolic disease: Secondary | ICD-10-CM | POA: Insufficient documentation

## 2012-12-01 DIAGNOSIS — E039 Hypothyroidism, unspecified: Secondary | ICD-10-CM | POA: Insufficient documentation

## 2012-12-01 DIAGNOSIS — F329 Major depressive disorder, single episode, unspecified: Secondary | ICD-10-CM | POA: Insufficient documentation

## 2012-12-01 DIAGNOSIS — R42 Dizziness and giddiness: Secondary | ICD-10-CM | POA: Insufficient documentation

## 2012-12-01 LAB — RAPID URINE DRUG SCREEN, HOSP PERFORMED
Amphetamines: NOT DETECTED
Barbiturates: NOT DETECTED
Benzodiazepines: POSITIVE — AB
Cocaine: NOT DETECTED
Opiates: NOT DETECTED
Tetrahydrocannabinol: NOT DETECTED

## 2012-12-01 LAB — URINALYSIS, ROUTINE W REFLEX MICROSCOPIC
Bilirubin Urine: NEGATIVE
Glucose, UA: NEGATIVE mg/dL
Hgb urine dipstick: NEGATIVE
Ketones, ur: NEGATIVE mg/dL
Leukocytes, UA: NEGATIVE
Nitrite: NEGATIVE
Protein, ur: NEGATIVE mg/dL
Specific Gravity, Urine: 1.019 (ref 1.005–1.030)
Urobilinogen, UA: 0.2 mg/dL (ref 0.0–1.0)
pH: 5 (ref 5.0–8.0)

## 2012-12-01 LAB — CBC WITH DIFFERENTIAL/PLATELET
Basophils Absolute: 0 10*3/uL (ref 0.0–0.1)
Basophils Relative: 0 % (ref 0–1)
Eosinophils Absolute: 0.1 10*3/uL (ref 0.0–0.7)
Eosinophils Relative: 2 % (ref 0–5)
HCT: 33.5 % — ABNORMAL LOW (ref 36.0–46.0)
Hemoglobin: 12.1 g/dL (ref 12.0–15.0)
Lymphocytes Relative: 42 % (ref 12–46)
Lymphs Abs: 2.4 10*3/uL (ref 0.7–4.0)
MCH: 35 pg — ABNORMAL HIGH (ref 26.0–34.0)
MCHC: 36.1 g/dL — ABNORMAL HIGH (ref 30.0–36.0)
MCV: 96.8 fL (ref 78.0–100.0)
Monocytes Absolute: 0.5 10*3/uL (ref 0.1–1.0)
Monocytes Relative: 9 % (ref 3–12)
Neutro Abs: 2.6 10*3/uL (ref 1.7–7.7)
Neutrophils Relative %: 46 % (ref 43–77)
Platelets: 323 10*3/uL (ref 150–400)
RBC: 3.46 MIL/uL — ABNORMAL LOW (ref 3.87–5.11)
RDW: 16 % — ABNORMAL HIGH (ref 11.5–15.5)
WBC: 5.7 10*3/uL (ref 4.0–10.5)

## 2012-12-01 LAB — COMPREHENSIVE METABOLIC PANEL
ALT: 7 U/L (ref 0–35)
AST: 16 U/L (ref 0–37)
Albumin: 3.5 g/dL (ref 3.5–5.2)
Alkaline Phosphatase: 85 U/L (ref 39–117)
BUN: 20 mg/dL (ref 6–23)
CO2: 18 mEq/L — ABNORMAL LOW (ref 19–32)
Calcium: 9.1 mg/dL (ref 8.4–10.5)
Chloride: 106 mEq/L (ref 96–112)
Creatinine, Ser: 1.1 mg/dL (ref 0.50–1.10)
GFR calc Af Amer: 65 mL/min — ABNORMAL LOW (ref >90)
GFR calc non Af Amer: 56 mL/min — ABNORMAL LOW (ref >90)
Glucose, Bld: 84 mg/dL (ref 70–99)
Potassium: 4.1 mEq/L (ref 3.5–5.1)
Sodium: 138 mEq/L (ref 135–145)
Total Bilirubin: 0.1 mg/dL — ABNORMAL LOW (ref 0.3–1.2)
Total Protein: 7.2 g/dL (ref 6.0–8.3)

## 2012-12-01 LAB — CARBAMAZEPINE LEVEL, TOTAL: Carbamazepine Lvl: 7.5 ug/mL (ref 4.0–12.0)

## 2012-12-01 LAB — ETHANOL: Alcohol, Ethyl (B): <11 mg/dL (ref 0–11)

## 2012-12-01 MED ORDER — IBUPROFEN 800 MG PO TABS
800.0000 mg | ORAL_TABLET | Freq: Once | ORAL | Status: AC
  Administered 2012-12-01: 800 mg via ORAL
  Filled 2012-12-01: qty 1

## 2012-12-01 NOTE — ED Provider Notes (Signed)
Medical screening examination/treatment/procedure(s) were performed by non-physician practitioner and as supervising physician I was immediately available for consultation/collaboration.  Leota Jacobsen, MD 12/01/12 [id number]

## 2012-12-01 NOTE — ED Notes (Addendum)
Pt. Ambulated to bathroom and to nurses station by self with no problems. Talked with patient about discharge. Pt. Verbalized understanding. States "I just want to go home and get in my own bed. I feel better".  Pt. Alert and oriented x4. Answers questions appropriately.

## 2012-12-01 NOTE — ED Notes (Signed)
Spoke with rehab nurse from [facility] where pt was and was told that pt arrived at facility for physical therapy due to frequent falls and balance problems.  RN walked pt back who stated she was "feeling fuzzy but fine". RN checked pressure manually.  Initially was 150/104; 5 minutes later 159/114.  Pt suddenly became lethargic with slurred speech and reported seeing double.  Per facility pt has a baseline altered mental status.

## 2012-12-01 NOTE — ED Notes (Signed)
Spoke with Dr. 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  Per MD, pt "acts like she is bat sh*t crazy".  She has been seen at [facility] for her psych hx which includes bipolar disorder.

## 2012-12-01 NOTE — ED Notes (Signed)
Spoke with Hoyle Sauer, NP who is now seeing pt at [facility] after previous MD retired.  States pt "is crazy, does not take her medication for bipolar disorder, does not followup with recommendations regarding health treatment".  States pt is bipolar and is attempting to get disability.

## 2012-12-01 NOTE — ED Provider Notes (Signed)
History     CSN: [id number]  Arrival date & time 12/01/12  1558   First MD Initiated Contact with Patient 12/01/12 1609      Chief Complaint  Patient presents with  . Altered Mental Status    (Consider location/radiation/quality/duration/timing/severity/associated sxs/prior treatment) HPI Patient presents to the emergency department with altered mental status, and lightheadedness that began today. Patient states, that she did not actually pass out.  The patient denies chest pain, shortness of breath, nausea, vomiting, diarrhea, dysuria, fever, blurred vision weakness or neck pain.  The patient, states, that she is had similar episodes in the past.  Patient denies anything makes her condition, better or worse Past Medical History  Diagnosis Date  . Hypertension   . Hypothyroid   . Lupus (systemic lupus erythematosus)   . Depressive disorder, not elsewhere classified   . Pure hypercholesterolemia   . Kidney stone     Past Surgical History  Procedure Laterality Date  . Abdominal hysterectomy    . Cesarean section    . Tonsillectomy    . Lumbar disc surgery  2003  . Ectopic pregnancy surgery      Family History  Problem Relation Age of Onset  . Autoimmune disease Neg Hx     History  Substance Use Topics  . Smoking status: Current Every Day Smoker -- 1.00 packs/day for 35 years    Types: Cigarettes  . Smokeless tobacco: Never Used  . Alcohol Use: No    OB History   Grav Para Term Preterm Abortions TAB SAB Ect Mult Living                  Review of Systems All other systems negative except as documented in the HPI. All pertinent positives and negatives as reviewed in the HPI. Allergies  Review of patient's allergies indicates no known allergies.  Home Medications   Current Outpatient Rx  Name  Route  Sig  Dispense  Refill  . Aspirin-Acetaminophen-Caffeine (GOODYS EXTRA STRENGTH PO)   Oral   Take by mouth.         . carbamazepine (TEGRETOL) 200 MG  tablet   Oral   Take 200 mg by mouth 3 (three) times daily.           . diazepam (VALIUM) 10 MG tablet   Oral   Take 10 mg by mouth every 8 (eight) hours as needed for anxiety or sleep.          [name] docusate sodium (COLACE) 100 MG capsule   Oral   Take 100 mg by mouth 3 (three) times daily as needed for constipation.         [name] escitalopram (LEXAPRO) 20 MG tablet   Oral   Take 20 mg by mouth daily.         . hydroxychloroquine (PLAQUENIL) 200 MG tablet   Oral   Take 200 mg by mouth 2 (two) times daily.           [name] levothyroxine (SYNTHROID) 75 MCG tablet   Oral   Take 75 mcg by mouth daily.          . methocarbamol (ROBAXIN) 500 MG tablet   Oral   Take 500 mg by mouth 3 (three) times daily.         [name] olmesartan-hydrochlorothiazide (BENICAR HCT) 20-12.5 MG per tablet   Oral   Take 0.5 tablets by mouth daily.         . predniSONE (DELTASONE) 5 MG  tablet   Oral   Take 10 mg by mouth daily as needed (for flares).          . QUEtiapine (SEROQUEL) 100 MG tablet   Oral   Take 300 mg by mouth at bedtime.            BP 138/97  Pulse 82  Temp(Src) 98.4 F (36.9 C) (Oral)  Resp 13  SpO2 99%  Physical Exam  Nursing note and vitals reviewed. Constitutional: She appears well-developed and well-nourished. No distress.  HENT:  Head: Normocephalic and atraumatic.  Eyes: Pupils are equal, round, and reactive to light.  Neck: Normal range of motion. Neck supple. No JVD present. No tracheal deviation present. No thyromegaly present.  Cardiovascular: Normal rate, regular rhythm and normal heart sounds.  Exam reveals no gallop and no friction rub.   No murmur heard. Pulmonary/Chest: Effort normal and breath sounds normal. No stridor.  Lymphadenopathy:    She has no cervical adenopathy.  Neurological: She is alert. She has normal strength. No sensory deficit. She exhibits normal muscle tone. Coordination and gait normal. GCS eye subscore is 4. GCS verbal subscore  is 5. GCS motor subscore is 6.  Skin: Skin is warm and dry. No rash noted.    ED Course  Procedures (including critical care time)  Labs Reviewed  CBC WITH DIFFERENTIAL - Abnormal; Notable for the following:    RBC 3.46 (*)    HCT 33.5 (*)    MCH 35.0 (*)    MCHC 36.1 (*)    RDW 16.0 (*)    All other components within normal limits  COMPREHENSIVE METABOLIC PANEL - Abnormal; Notable for the following:    CO2 18 (*)    Total Bilirubin 0.1 (*)    GFR calc non Af Amer 56 (*)    GFR calc Af Amer 65 (*)    All other components within normal limits  URINE RAPID DRUG SCREEN (HOSP PERFORMED) - Abnormal; Notable for the following:    Benzodiazepines POSITIVE (*)    All other components within normal limits  URINALYSIS, ROUTINE W REFLEX MICROSCOPIC  ETHANOL  CARBAMAZEPINE LEVEL, TOTAL   Ct Head Wo Contrast  12/01/2012   *RADIOLOGY REPORT*  Clinical Data: Syncopal episode, altered mental status, history of lupus  CT HEAD WITHOUT CONTRAST  Technique:  Contiguous axial images were obtained from the base of the skull through the vertex without contrast.  Comparison: None.  Findings: The ventricular system is normal in size and configuration, and the septum is in a normal midline position.  The fourth ventricle and basilar cisterns are unremarkable.  No hemorrhage, mass lesion, or acute infarction is seen benign appearing basal ganglial calcifications are present bilaterally. On bone window images no calvarial abnormality is noted.  The paranasal sinuses that are visualized are clear.  IMPRESSION: Negative unenhanced CT of the brain.   Original Report Authenticated By: Ivar Drape, M.D.    We spoke with the patient's physician, who states, that the patient has had similar episodes due to her mental illness in the past.  He states, that she is been known to have these types of issues.  The patient does not take her medicines appropriately.  He states, that this does not seem any different than her  previous episodes.patient has ambulated to the bathroom without difficulty.  Patient, states she has not eaten much today. Patient is advised to return here for any worsening in her condition   MDM  MDM Reviewed: nursing note  and vitals Interpretation: CT scan and labs Consults: primary care provider            Brent General, PA-C 12/01/12 2035

## 2012-12-01 NOTE — Telephone Encounter (Signed)
Anna [name] with [facility] out pt called and states pt bp is 159/114, fuzzy headed and head ache.  I spoke with Dr. Tomi Bamberger and she inquired is pt have mental status changes more than usual.  Therapist said this was the first time she had ever met this patient.  I asked her to ask the patient and the pt said some, yes.  Also I could hear that the patients speech was slurred and the therapist said the pt speech is slurred.  I advised go to ER.

## 2012-12-01 NOTE — ED Notes (Signed)
Pt reports to the ED from [facility] and was lethargic and disoriented. Pt responsive to repeated verbal stimuli. Pt able to follow some but not all commands. Pt able to vebalized where she is usually stuck for IVs and remembered she brought her purse however she would not answer any questions. Pt reports generalized pain. PERRLA intact. Grips are equal and no drift or facial asymmetry noted. Pt has hx of DM and CBG 77 mg/dl. V/S stable en route. 12 lead showed NRS en route.

## 2012-12-08 ENCOUNTER — Encounter: Payer: Self-pay | Admitting: Family Medicine

## 2012-12-08 ENCOUNTER — Ambulatory Visit (INDEPENDENT_AMBULATORY_CARE_PROVIDER_SITE_OTHER): Payer: BC Managed Care – PPO | Admitting: Family Medicine

## 2012-12-08 VITALS — BP 130/100 | HR 80 | Ht 69.0 in | Wt 171.0 lb

## 2012-12-08 DIAGNOSIS — K5900 Constipation, unspecified: Secondary | ICD-10-CM

## 2012-12-08 DIAGNOSIS — R42 Dizziness and giddiness: Secondary | ICD-10-CM

## 2012-12-08 DIAGNOSIS — K219 Gastro-esophageal reflux disease without esophagitis: Secondary | ICD-10-CM

## 2012-12-08 DIAGNOSIS — E039 Hypothyroidism, unspecified: Secondary | ICD-10-CM

## 2012-12-08 DIAGNOSIS — I10 Essential (primary) hypertension: Secondary | ICD-10-CM

## 2012-12-08 NOTE — Progress Notes (Signed)
Chief Complaint  Patient presents with  . Hospitalization Follow-up    she was at the vestibular rehab and was feeling lethargic and lightheaded. Also states that her bp was high.    May 28th Stephanie with [facility] out[facility] called and stated pt bp was 159/114, pt felt fuzzy headed and had a headache. The therapist didn't know pt to know her baseline, but was concerned about her mental status.  Pt felt like it was different, and person taking telephone call here felt like her speech was slurred, so she was sent to ER.  In ER, BP wasn't as high (138/97).  Labs were done, and CT was negative; full ER note was reviewed.  She stayed fuzzyheaded for a couple of days after ER visit, then started feeling back to her normal self.  She has f/u scheduled tomorrow with neuro. Currently feels back to baseline, denying headache, mental status changes.    She has cut back on fast food and salt in diet.  She is eating healthier, grilling more, less canned foods.  She is complaining of constipation, and swelling in her feet.  She is currently on prednisone 10mg  daily for lupus flare. Last BM was 2 days ago, passing hard rocks.  Taking stool softeners TID, took some mineral oil.  She has been using Miralax daily since last visit. Having a lot of gas, but complaining of feeling bloated.  She had been eating more fruit, got weight down to 159.  Eating oranges, apples, puts bananas on her cheerios.  Eats bananas most days, to help with her potassium.  Vomits sometimes at night.  She reports coughing, but doesn't feel like she is gagging from coughing.  Has some heartburn, taking zantac once daily in the morning which helps.,   Past Medical History  Diagnosis Date  . Hypertension   . Hypothyroid   . Lupus (systemic lupus erythematosus)   . Depressive disorder, not elsewhere classified   . Pure hypercholesterolemia   . Kidney stone    Past Surgical History  Procedure Laterality Date  . Abdominal  hysterectomy    . Cesarean section    . Tonsillectomy    . Lumbar disc surgery  2003  . Ectopic pregnancy surgery     History   Social History  . Marital Status: Married    Spouse Name: N/A    Number of Children: N/A  . Years of Education: N/A   Occupational History  . Not on file.   Social History Main Topics  . Smoking status: Current Every Day Smoker -- 1.00 packs/day for 35 years    Types: Cigarettes  . Smokeless tobacco: Never Used  . Alcohol Use: No  . Drug Use: No  . Sexually Active: Not on file   Other Topics Concern  . Not on file   Social History Narrative  . No narrative on file   Current outpatient prescriptions:carbamazepine (TEGRETOL) 200 MG tablet, Take 200 mg by mouth 3 (three) times daily.  , Disp: , Rfl: ;  diazepam (VALIUM) 10 MG tablet, Take 10 mg by mouth every 8 (eight) hours as needed for anxiety or sleep. , Disp: , Rfl: ;  docusate sodium (COLACE) 100 MG capsule, Take 100 mg by mouth 3 (three) times daily as needed for constipation., Disp: , Rfl:  escitalopram (LEXAPRO) 20 MG tablet, Take 20 mg by mouth daily., Disp: , Rfl: ;  hydroxychloroquine (PLAQUENIL) 200 MG tablet, Take 200 mg by mouth 2 (two) times daily.  ,  Disp: , Rfl: ;  levothyroxine (SYNTHROID) 75 MCG tablet, Take 75 mcg by mouth daily. , Disp: , Rfl: ;  methocarbamol (ROBAXIN) 500 MG tablet, Take 500 mg by mouth 3 (three) times daily., Disp: , Rfl:  olmesartan-hydrochlorothiazide (BENICAR HCT) 20-12.5 MG per tablet, Take 0.5 tablets by mouth daily., Disp: , Rfl: ;  polyethylene glycol (MIRALAX / GLYCOLAX) packet, Take 17 g by mouth daily., Disp: , Rfl: ;  predniSONE (DELTASONE) 5 MG tablet, Take 10 mg by mouth daily as needed (for flares). , Disp: , Rfl: ;  QUEtiapine (SEROQUEL) 300 MG tablet, Take 300-600 mg by mouth at bedtime., Disp: , Rfl:  ranitidine (ZANTAC) 75 MG tablet, Take 75 mg by mouth 2 (two) times daily., Disp: , Rfl: ;  Aspirin-Acetaminophen-Caffeine (GOODYS EXTRA STRENGTH PO),  Take by mouth., Disp: , Rfl:  (was taking 1/2 tablet of Benicar HCT prior to appt)  No Known Allergies  ROS:  No fevers. Some congestion/sinus problems, mucus is clear, occasionally yellow.  No shortness of breath.  No blood in stool, skin rashes or other concerns except per HPI.  PHYSICAL EXAM: BP 130/100  Pulse 80  Ht 5\' 9"  (1.753 m)  Wt 171 lb (77.565 kg)  BMI 25.24 kg/m2 130/92 on repeat by MD, right arm Well developed female, somewhat unusual, slightly flattened affect, slow speech HEENT:  Conjunctiva clear. PERRL, OP clear, sinuses nontender Neck: no lymphadenopathy Heart: regular rate and rhythm without murmur Lungs: clear bilaterally Abdomen: soft, nontender Extremities: no edema Skin: no rash  ASSESSMENT/PLAN:  Essential hypertension, benign  GERD (gastroesophageal reflux disease)  Unspecified hypothyroidism  Unspecified constipation  Vertigo   Constipation: Discussed foods high in potassium; avoid bananas.  Continue Miralax--may increase to BID for a week.  If ongoing constipation, may need to try other medications.  Await labs.  Scheduled for later this month (including TSH). Increase fluid intake.  HTN:  Increase Benicar HCT to full tablet. Monitor BP's elsewhere.  Continue exercise, low sodium diet. ?altered mental status--?related to high BP's, resolved now Vertigo--has f/u with neuro tomorrow.  Only had that 1 session of vestibular rehab. Vomiting--most likely from reflux. Increase Zantac to BID dosing.  Can further increase dose or change to PPI if needed.  F/u as scheduled later this month, with labs prior.

## 2012-12-08 NOTE — Patient Instructions (Addendum)
Try and drink more water.  Cut back on fruit punches. Cut back on bananas; other fruits are fine to eat.  Continue to get potassium from your diet (orange juice, potatoes, etc).  You may increase miralax to twice daily for up to a week.  Your bloodwork scheduled for later this month will let us recheck your thyroid (if underactive, this contributes to constipation), and your potassium  Increase your benicar HCT to the full tablet, as your blood pressure has been running high. Continue low sodium, high fiber diet. If you buy a blood pressure monitor, check your blood pressure daily and write it down.  Bring your machine and your list of blood pressures to your next visit, later this month.  Let us know if your blood pressures drop below 100/50, or having more problems with feeling lightheaded (not vertigo/wooziness)   Increase your zantac to twice daily.

## 2012-12-09 ENCOUNTER — Encounter: Payer: Self-pay | Admitting: Nurse Practitioner

## 2012-12-09 ENCOUNTER — Ambulatory Visit (INDEPENDENT_AMBULATORY_CARE_PROVIDER_SITE_OTHER): Payer: BC Managed Care – PPO | Admitting: Nurse Practitioner

## 2012-12-09 VITALS — BP 140/91 | HR 110 | Ht 67.0 in | Wt 172.0 lb

## 2012-12-09 DIAGNOSIS — G43009 Migraine without aura, not intractable, without status migrainosus: Secondary | ICD-10-CM

## 2012-12-09 NOTE — Progress Notes (Signed)
I reviewed note and agree with plan.   Penni Bombard, MD [id number], [id number] PM Certified in Neurology, Neurophysiology and Neuroimaging  [facility] [address], [place], [place] [address] [phone]

## 2012-12-09 NOTE — Progress Notes (Signed)
HPI: Pt returns for followup. Originally evaluated by Dr. Erling Cruz 6/ 03/2004 for headaches, dizziness, and visual disturbance. MRI study of the brain 12/07/2003 and intracranial MRA 12/11/2003  were normal. She had a history of dizziness that sounded as if it was true vertigo. She had double vision on red lens testing  and was seen by Dr. Jolyn Nap at [facility] and Dr. 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 TSH was low at 0.012, sedimentation rate was 4, CPK 88, serum for acetylcholine receptor antibodies was normal, and hemoglobin A1c was 5.6. Dr Erling Cruz  saw her again 04/29/2007 for double vision. She was seen by Dr. Knute Neu and diagnosed with systemic lupus erythematosus in 2008. 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 She was seen again 08/12/2007 given a trial of clonazepam for convergence spasm. Her double vision  is side-by-side and intermittent. She complains of intermittent lightheaded  or dizziness worse with standing up or when walking lasting seconds to a few minutes. She has noted headaches that are vague in location. She states she has a lot of stress in her life.  Her MRI of the brain in December 2010  was normal.  TODAY: 12/09/12: Recent admission to the ER for dizziness and lightheadedness. Blood pressure noted to be extremely elevated. Her Benicar was increased by Dr. Tomi Bamberger.  No recent headaches.  No change in convergence spasm,  she says it is worse when she is stressed.  Has not followed up with Dr. Katy Fitch she was given  bifocal glasses but she does not wear them. She is trying to get disability. She has  Lupus. She recently was diagnosed with asthma. She sees Dr. Candis Schatz for bipolar disorder.She has been referred to vestibular rehab. She is driving.     ROS:  Follow up for headaches, lightheadedness  Physical Exam General: well developed, well nourished, seated, in no evident  distress Head: head normocephalic and atraumatic. Oropharynx benign Neck: supple with no carotid  bruits Cardiovascular: regular rate and rhythm, no murmurs  Neurologic Exam Mental Status: Awake and fully alert. Follows all commands. Speech and language normal.   Cranial Nerves: Fundoscopic exam reveals flat discs.Pupils equal, briskly reactive to light. Extraocular movements full without nystagmus. Visual fields full to confrontation. Hearing intact and symmetric to finger snap. Facial sensation intact. Face, tongue, palate move normally and symmetrically. Neck flexion and extension normal. Has evidence of accommodative  spasm. Motor: Normal bulk and tone. Normal strength in all tested extremity muscles.No focal weakness Sensory.: intact to touch and pinprick and vibratory.  Coordination: Rapid alternating movements normal in all extremities. Finger-to-nose and heel-to-shin performed accurately bilaterally. Gait and Station: Arises from chair without difficulty. Stance is normal. . Able to heel, toe and unsteady with tandem walk.   Reflexes: 2+ and symmetric. Toes downgoing.     ASSESSMENT: History of headaches that may represent migraines, patient is more concerned about her hypertension today. She also has a history of lupus,  bipolar disorder, and hyperlipidemia.Chronic constipation. Recent ER adm for elevated B/P and lightheadedness.     PLAN: Check with vestibular rehab to start back Keep B/P in good control todays reading 141/90 F/u in 6 months  For Constipation, try  1 cup of bran, 1 cup of applesauce in 1 cup of prune juice, mix together, makes a paste,  1 to 2 tsp daily 2.  Increase fiber intake (fruits,vegetables) 3.  Regular, moderate exercise can  be beneficial. 4.  Avoid medications causing constipation, such as medications like antacids with calcium or magnesium 5.  Laxative overuse should be avoided. 6.  Stool softeners (Colace) can help with chronic  constipation.   Dennie Bible, GNP-BC APRN

## 2012-12-09 NOTE — Patient Instructions (Addendum)
Check with vestibular rehab to start back Keep B/P in good control todays reading 141/90 F/u in 6 months Constipation 1 cup of bran, 1 cup of applesauce in 1 cup of prune juice, mix together, makes a paste 2.  Increase fiber intake (fruits,vegetables) 3.  Regular, moderate exercise can be beneficial. 4.  Avoid medications causing constipation, such as medications like antacids with calcium or magnesium 5.  Laxative overuse should be avoided. 6.  Stool softeners (Colace) can help with chronic constipation.

## 2012-12-10 ENCOUNTER — Ambulatory Visit: Payer: BC Managed Care – PPO | Attending: Family Medicine | Admitting: Physical Therapy

## 2012-12-10 ENCOUNTER — Telehealth: Payer: Self-pay | Admitting: Internal Medicine

## 2012-12-10 DIAGNOSIS — IMO0001 Reserved for inherently not codable concepts without codable children: Secondary | ICD-10-CM | POA: Insufficient documentation

## 2012-12-10 DIAGNOSIS — M6281 Muscle weakness (generalized): Secondary | ICD-10-CM | POA: Insufficient documentation

## 2012-12-10 NOTE — Telephone Encounter (Signed)
Refill request for benicar hct 20-12.5 #90 to [facility] [address]

## 2012-12-13 ENCOUNTER — Other Ambulatory Visit: Payer: Self-pay | Admitting: *Deleted

## 2012-12-13 DIAGNOSIS — I10 Essential (primary) hypertension: Secondary | ICD-10-CM

## 2012-12-13 MED ORDER — OLMESARTAN MEDOXOMIL-HCTZ 20-12.5 MG PO TABS: 1.0000 | ORAL_TABLET | Freq: Every day | ORAL | Status: DC

## 2012-12-13 NOTE — Telephone Encounter (Signed)
Done

## 2012-12-15 ENCOUNTER — Ambulatory Visit: Payer: BC Managed Care – PPO | Admitting: Physical Therapy

## 2012-12-17 ENCOUNTER — Ambulatory Visit: Payer: BC Managed Care – PPO | Admitting: Physical Therapy

## 2012-12-21 ENCOUNTER — Encounter: Payer: Self-pay | Admitting: Medical

## 2012-12-21 ENCOUNTER — Ambulatory Visit (INDEPENDENT_AMBULATORY_CARE_PROVIDER_SITE_OTHER): Payer: BC Managed Care – PPO | Admitting: Medical

## 2012-12-21 ENCOUNTER — Ambulatory Visit: Payer: BC Managed Care – PPO | Admitting: Physical Therapy

## 2012-12-21 VITALS — BP 138/88 | HR 88 | Temp 97.9°F | Resp 16 | Wt 173.0 lb

## 2012-12-21 DIAGNOSIS — F17200 Nicotine dependence, unspecified, uncomplicated: Secondary | ICD-10-CM

## 2012-12-21 DIAGNOSIS — W19XXXS Unspecified fall, sequela: Secondary | ICD-10-CM

## 2012-12-21 DIAGNOSIS — R42 Dizziness and giddiness: Secondary | ICD-10-CM

## 2012-12-21 DIAGNOSIS — I10 Essential (primary) hypertension: Secondary | ICD-10-CM

## 2012-12-21 NOTE — Patient Instructions (Signed)
Reasons to call 911 or go to the emergency department:  1 sided weakness, numbness, or tingling  Sudden loss of vision  Confusion, slurred speech  Facial muscles being abnormal, asymmetry to face  Chest pain  BP with systolic (top number) over [id number], or diastolic (bottom number) over 120  Not your usual self

## 2012-12-21 NOTE — Progress Notes (Signed)
Subjective: Here today, sent from rehab.   She normally sees Dr. Tomi Bamberger or Dr. Redmond School here.   She was seen recently by Dr. Tomi Bamberger for hypertension, falls, and has subsequently seen neurology for the falls and vertigo she has been having.   Was started on vestibular rehab.  Few weeks ago had confusion and elevated BP while in rehab, was sent to [facility] ED for evaluation. Had labs, CT head, other test, and by the time she left BP was fine.  She notes today was at rehab doing the usual exercises and after discussing her leg feeling like it was sliding this morning or weak, they checked her BP.  It was apparently high, but we are not sure what the measurement was.    She was told to come in here for evaluation.  She doesn't check her BPs in general.  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   Past Medical History  Diagnosis Date  . Hypertension   . Hypothyroid   . Lupus (systemic lupus erythematosus)   . Depressive disorder, not elsewhere classified   . Pure hypercholesterolemia   . Kidney stone    ROS as in subjective   Objective:   Physical Exam  Filed Vitals:   12/21/12 1136  BP: 138/88  Pulse: 88  Temp: 97.9 F (36.6 C)  Resp: 16    General appearance: alert, no distress, WD/WN, seated with walker HEENT: normocephalic, sclerae anicteric, PERRLA, EOMi, nares patent, no discharge or erythema, pharynx normal Oral cavity: MMM, no lesions Neck: supple, no lymphadenopathy, no thyromegaly, no masses, no bruits Heart: RRR, normal S1, S2, no murmurs Lungs: CTA bilaterally, no wheezes, rhonchi, or rales Extremities: no edema, no cyanosis, no clubbing Pulses: 2+ symmetric, upper and lower extremities, normal cap refill Neurological: alert, oriented x 3, eyes deviated with accomodation and EOMi, otherwise CN2-12 intact, strength normal upper  extremities and lower extremities, sensation normal throughout, DTRs 2+ throughout, no cerebellar signs Psychiatric: normal affect, behavior appropriate, pleasant    Assessment and Plan :     Encounter Diagnoses  Name Primary?  . Essential hypertension, benign Yes  . Falls, sequela   . Vertigo   . Tobacco use disorder    Reviewed recent ED visit, recent office notes from Dr. Tomi Bamberger, neurology, and discussed case with supervising physician Dr. Redmond School.   There is no worrisome symptoms at current, BP is ok here, and she has no current c/o.   At this point advised she f/u with neurology as scheduled, c/t vestibular rehab, c/t present BP medication.  Discussed signs/symptoms of TIA/CVA or acute coronary syndrome that would prompt 911 call.   Follow-up prn.

## 2012-12-23 ENCOUNTER — Ambulatory Visit: Payer: BC Managed Care – PPO | Admitting: Physical Therapy

## 2012-12-28 ENCOUNTER — Ambulatory Visit: Payer: BC Managed Care – PPO | Admitting: Physical Therapy

## 2012-12-30 ENCOUNTER — Ambulatory Visit: Payer: BC Managed Care – PPO | Admitting: Physical Therapy

## 2013-01-04 ENCOUNTER — Ambulatory Visit: Payer: BC Managed Care – PPO | Attending: Family Medicine | Admitting: Physical Therapy

## 2013-01-04 DIAGNOSIS — M6281 Muscle weakness (generalized): Secondary | ICD-10-CM | POA: Insufficient documentation

## 2013-01-04 DIAGNOSIS — IMO0001 Reserved for inherently not codable concepts without codable children: Secondary | ICD-10-CM | POA: Insufficient documentation

## 2013-01-06 ENCOUNTER — Ambulatory Visit: Payer: BC Managed Care – PPO | Admitting: Physical Therapy

## 2013-01-10 ENCOUNTER — Other Ambulatory Visit: Payer: BC Managed Care – PPO

## 2013-01-10 DIAGNOSIS — E039 Hypothyroidism, unspecified: Secondary | ICD-10-CM

## 2013-01-10 DIAGNOSIS — E785 Hyperlipidemia, unspecified: Secondary | ICD-10-CM

## 2013-01-10 DIAGNOSIS — I10 Essential (primary) hypertension: Secondary | ICD-10-CM

## 2013-01-10 LAB — LIPID PANEL
Cholesterol: 186 mg/dL (ref 0–200)
HDL: 35 mg/dL — ABNORMAL LOW (ref >39)
LDL Cholesterol: 112 mg/dL — ABNORMAL HIGH (ref 0–99)
Triglycerides: 194 mg/dL — ABNORMAL HIGH (ref <150)
VLDL: 39 mg/dL (ref 0–40)

## 2013-01-10 LAB — TSH: TSH: 0.729 u[IU]/mL (ref 0.350–4.500)

## 2013-01-10 LAB — COMPREHENSIVE METABOLIC PANEL
ALT: <8 U/L (ref 0–35)
AST: 14 U/L (ref 0–37)
BUN: 23 mg/dL (ref 6–23)
Calcium: 9 mg/dL (ref 8.4–10.5)
Creat: 1.17 mg/dL — ABNORMAL HIGH (ref 0.50–1.10)
Total Bilirubin: 0.2 mg/dL — ABNORMAL LOW (ref 0.3–1.2)

## 2013-01-11 ENCOUNTER — Ambulatory Visit: Payer: BC Managed Care – PPO | Admitting: Physical Therapy

## 2013-01-12 ENCOUNTER — Encounter: Payer: BC Managed Care – PPO | Admitting: Family Medicine

## 2013-01-14 ENCOUNTER — Ambulatory Visit: Payer: BC Managed Care – PPO | Admitting: Physical Therapy

## 2013-01-18 ENCOUNTER — Encounter: Payer: BC Managed Care – PPO | Admitting: Family Medicine

## 2013-01-18 ENCOUNTER — Ambulatory Visit: Payer: BC Managed Care – PPO | Admitting: Physical Therapy

## 2013-01-20 ENCOUNTER — Ambulatory Visit: Payer: BC Managed Care – PPO | Admitting: Physical Therapy

## 2013-01-25 ENCOUNTER — Ambulatory Visit: Payer: BC Managed Care – PPO | Admitting: Physical Therapy

## 2013-01-28 ENCOUNTER — Ambulatory Visit: Payer: BC Managed Care – PPO | Admitting: Physical Therapy

## 2013-02-01 ENCOUNTER — Ambulatory Visit: Payer: BC Managed Care – PPO | Admitting: Physical Therapy

## 2013-02-03 ENCOUNTER — Ambulatory Visit: Payer: BC Managed Care – PPO | Admitting: Physical Therapy

## 2013-02-08 ENCOUNTER — Telehealth: Payer: Self-pay | Admitting: Family Medicine

## 2013-02-08 NOTE — Telephone Encounter (Signed)
Please call Patient wants to know status of disability forms she left to be completed

## 2013-02-08 NOTE — Telephone Encounter (Signed)
Let pt know that I'm working on them and they will be ready when I return to the office tomorrow.  It also is a request for records to be sent

## 2013-02-21 ENCOUNTER — Telehealth: Payer: Self-pay | Admitting: Internal Medicine

## 2013-02-21 NOTE — Telephone Encounter (Signed)
Faxed over medical records to [employer] @ [phone]

## 2013-02-22 DIAGNOSIS — Z0289 Encounter for other administrative examinations: Secondary | ICD-10-CM

## 2013-03-10 ENCOUNTER — Ambulatory Visit (INDEPENDENT_AMBULATORY_CARE_PROVIDER_SITE_OTHER): Payer: BC Managed Care – PPO | Admitting: Family Medicine

## 2013-03-10 ENCOUNTER — Encounter: Payer: Self-pay | Admitting: Family Medicine

## 2013-03-10 VITALS — BP 112/70 | HR 80 | Ht 69.0 in | Wt 166.0 lb

## 2013-03-10 DIAGNOSIS — J309 Allergic rhinitis, unspecified: Secondary | ICD-10-CM

## 2013-03-10 DIAGNOSIS — F17200 Nicotine dependence, unspecified, uncomplicated: Secondary | ICD-10-CM

## 2013-03-10 DIAGNOSIS — Z23 Encounter for immunization: Secondary | ICD-10-CM

## 2013-03-10 DIAGNOSIS — E785 Hyperlipidemia, unspecified: Secondary | ICD-10-CM

## 2013-03-10 DIAGNOSIS — I10 Essential (primary) hypertension: Secondary | ICD-10-CM

## 2013-03-10 DIAGNOSIS — E039 Hypothyroidism, unspecified: Secondary | ICD-10-CM

## 2013-03-10 DIAGNOSIS — K5900 Constipation, unspecified: Secondary | ICD-10-CM

## 2013-03-10 MED ORDER — LOSARTAN POTASSIUM-HCTZ 100-12.5 MG PO TABS: 1.0000 | ORAL_TABLET | Freq: Every day | ORAL | Status: DC

## 2013-03-10 NOTE — Patient Instructions (Addendum)
Continue stool softener daily (colace, 2-3 tablets every day). Miralax--can increase to twice daily if once daily isn't enough. Use Ex-lax sparingly (2x/month, or less, if you are able to get by without it).  Start claritin once daily.  Start taking mucinex at bedtime.  Try using sinus rinses or neti-pot at  Bedtime (up to twice daily as needed, to help clear your sinuses so they don't bother you in the middle of the night).  Start taking omega-3 fish oil, 3000-4000 mg daily.  This will help lower the triglycerides, maybe help raise the HDL (good cholesterol).  Increasing exercise and quitting smoking also helps raise the HDL.  Lab Results  Component Value Date   CHOL 186 01/10/2013   HDL 35* 01/10/2013   LDLCALC 112* 01/10/2013   TRIG 194* 01/10/2013   CHOLHDL 5.3 01/10/2013   I'm sending Dr. Wilson Singer your lab results.  Here is the [facility] for your records.  Lab Results  Component Value Date   TSH 0.729 01/10/2013   We replaced your Benicar HCT with losartan HCTZ for cost purposes.  Continue to monitor your blood pressure at home.  Bring your machine to your next visit, along with your list of blood pressures.

## 2013-03-10 NOTE — Progress Notes (Signed)
Chief Complaint  Patient presents with  . Hypertension    follow up on bp.   Patient presents for follow up on hypertension.  She is also here to follow up on labs she had done about 2 months ago (she no-showed her appt).   She is noted to frequently have elevated BP's while at PT.  She went to [facility] on [facility] a few weeks ago, when BP was high at PT, and she doesn't feeling very well (headache, somewhat weak, weird).  She was started on amlodipine 5mg , as well as was given tramadol for pain, which she has been taking twice daily.  She reports she is due to follow up over there sometime next week.  Prior to this, her dose of Benicar HCT had been increased from 1/2 tablet to full tablet, due to her elevated BP's.  She denies any side effects from amlodipine.  She has some chronic constipation, not worse since starting the amlodipine.  She needs to use a laxative periodically, as stool softener and miralax weren't effective.  She last used laxative on Sunday, no stool since then.  She will have small stools with the stool softener/miralax regimen, but feels like she isn't emptying well, and that she empties much better when using stimulant laxative.  BP's at home have been running 138/90, since she has been on the amlodipine.  She has a monitor--didn't bring it to have it checked/verified.  She now reports that she is unable to continue to afford the Benicar HCT.    She has pain ranging from 8-9/10 in her shoulders and in her low back.  She stopped using tylenol, and is using Bayer back and body instead (instead of tylenol, as well as in place of [place] powder).  She was also put on Ultram by the docs at [facility], and that has been helpful, using it BID.  She is waking up 3 am with postnasal drainage and choking.  She has a lot of congestion and postnasal drip.  Drinking Simply Lemon helps it come up "in chunks".  Only has nasal congestion and sinus pain during the night; minimal symptoms during the day.   Occurs every night at 3 am.  She has been taking benadryl at bedtime, and sometimes mucinex, but doesn't seem to help.  She continues to smoke, and admits that she isn't ready at all to quit.  Past Medical History  Diagnosis Date  . Hypertension   . Hypothyroid   . Lupus (systemic lupus erythematosus)   . Depressive disorder, not elsewhere classified   . Pure hypercholesterolemia   . Kidney stone    Past Surgical History  Procedure Laterality Date  . Abdominal hysterectomy    . Cesarean section    . Tonsillectomy    . Lumbar disc surgery  2003  . Ectopic pregnancy surgery     History   Social History  . Marital Status: Married    Spouse Name: N/A    Number of Children: N/A  . Years of Education: N/A   Occupational History  . Not on file.   Social History Main Topics  . Smoking status: Current Every Day Smoker -- 1.00 packs/day for 35 years    Types: Cigarettes  . Smokeless tobacco: Never Used  . Alcohol Use: No  . Drug Use: No  . Sexual Activity: Not on file   Other Topics Concern  . Not on file   Social History Narrative  . No narrative on file  Current outpatient prescriptions:amLODipine (NORVASC) 5 MG tablet, Take 1 tablet by mouth daily., Disp: , Rfl: ;  Aspirin-Caffeine (BAYER BACK & BODY PAIN EX ST PO), Take 2 tablets by mouth 2 (two) times daily., Disp: , Rfl: ;  carbamazepine (TEGRETOL) 200 MG tablet, Take 200 mg by mouth 3 (three) times daily.  , Disp: , Rfl: ;  diazepam (VALIUM) 10 MG tablet, Take 10 mg by mouth every 8 (eight) hours as needed for anxiety or sleep. , Disp: , Rfl:  docusate sodium (COLACE) 100 MG capsule, Take 100 mg by mouth 3 (three) times daily as needed for constipation., Disp: , Rfl: ;  escitalopram (LEXAPRO) 20 MG tablet, Take 20 mg by mouth daily., Disp: , Rfl: ;  hydroxychloroquine (PLAQUENIL) 200 MG tablet, Take 200 mg by mouth 2 (two) times daily.  , Disp: , Rfl: ;  levothyroxine (SYNTHROID) 75 MCG tablet, Take 75 mcg by mouth daily.  , Disp: , Rfl:  QUEtiapine (SEROQUEL) 300 MG tablet, Take 300-600 mg by mouth at bedtime., Disp: , Rfl: ;  ranitidine (ZANTAC) 75 MG tablet, Take 75 mg by mouth 2 (two) times daily., Disp: , Rfl: ;  traMADol (ULTRAM) 50 MG tablet, Take 50 mg by mouth 2 (two) times daily., Disp: , Rfl: ;  Aspirin-Acetaminophen-Caffeine (GOODYS EXTRA STRENGTH PO), Take by mouth., Disp: , Rfl:  losartan-hydrochlorothiazide (HYZAAR) 100-12.5 MG per tablet, Take 1 tablet by mouth daily., Disp: 30 tablet, Rfl: 2;  methocarbamol (ROBAXIN) 500 MG tablet, Take 500 mg by mouth 3 (three) times daily., Disp: , Rfl: ;  predniSONE (DELTASONE) 5 MG tablet, Take 10 mg by mouth daily as needed (for flares). , Disp: , Rfl:   No Known Allergies  ROS:  Denies fevers, chills, nausea, vomiting (just gagging/choking related to cough in middle of the night). Currently doesn't have headache, denies dizziness.  +8/10 pain currently.  Denies bleeding/bruising, rashes, shortness of breath or other complaints currently, except those noted above.  PHYSICAL EXAM: BP 112/70  Pulse 80  Ht 5\' 9"  (1.753 m)  Wt 166 lb (75.297 kg)  BMI 24.5 kg/m2 112/70 Well developed, pleasant female, who is talkative, sometimes will jump from topics; intermittently slightly irritable Neck: no lymphadenopathy or mass Heart: regular rate and rhythm without murmur Lungs: clear bilaterally HEENT:  PERRL, EOMI, conjunctiva clear.  TM's and EAC's normal.  Nasal mucosa mildly edematous, no erythema or purulence.  Sinuses nontender. Abdomen: soft, nontender, no mass Extremities: no edema  Lab Results  Component Value Date   CHOL 186 01/10/2013   HDL 35* 01/10/2013   LDLCALC 112* 01/10/2013   TRIG 194* 01/10/2013   CHOLHDL 5.3 01/10/2013     Chemistry      Component Value Date/Time   NA 136 01/10/2013 1234   K 4.7 01/10/2013 1234   CL 106 01/10/2013 1234   CO2 25 01/10/2013 1234   BUN 23 01/10/2013 1234   CREATININE 1.17* 01/10/2013 1234   CREATININE 1.10 12/01/2012 1641       Component Value Date/Time   CALCIUM 9.0 01/10/2013 1234   ALKPHOS 83 01/10/2013 1234   AST 14 01/10/2013 1234   ALT <8 01/10/2013 1234   BILITOT 0.2* 01/10/2013 1234     Glucose 87  Lab Results  Component Value Date   TSH 0.729 01/10/2013     ASSESSMENT/PLAN:  Essential hypertension, benign - well controlled on Benicar HCT and amlodipine.  Can't afford Benicar HCT, change to generic Hyzaar.   Hyperlipidemia - improved with dietary changes.  HDL still low--encouraged daily exercise and quitting smoking.  - Plan: losartan-hydrochlorothiazide (HYZAAR) 100-12.5 MG per tablet  Unspecified hypothyroidism - adequately replaced.  will send copies of labs to Dr. Wilson Singer  Unspecified constipation - use stimulant laxatives sparingly.  increase miralax to BID if needed; continue stool softeners  Need for prophylactic vaccination against Streptococcus pneumoniae (pneumococcus) - Plan: Pneumococcal conjugate vaccine 13-valent less than 5yo IM  Tobacco use disorder - not interested in quitting  Need for prophylactic vaccination and inoculation against influenza - Plan: Flu Vaccine QUAD 36+ mos IM  Allergic rhinitis, cause unspecified - with postnasal drip and nocturnal symptoms.  no infection per exam.  at risk due to smoking  Continue stool softener daily (colace, 2-3 tablets every day). Miralax--can increase to twice daily if once daily isn't enough. Use Ex-lax sparingly (2x/month, or less, if you are able to get by without it).  Start claritin once daily.  Start taking mucinex at bedtime.  Try using sinus rinses or neti-pot at bedtime (up to twice daily as needed, to help clear your sinuses so they don't bother you in the middle of the night).  Lipids improved.  TG elevated, but HDL still low.  Advised to quit smoking.  Recommended omega-3 fish oil 3000mg  daily.  Send Dr. Wilson Singer copies of labs--she is due to see him for her thyroid.  prevnar 13 and flu shot today. Encouraged to quit  smoking--admits she isn't ready.  HTN--controlled on current regimen, but not affordable. Change to losartan HCT--will start with higher dose.  If BP's too low, can cut back some on amlodipine.  Continue to monitor BP's and keep list.  Bring list (and BP machine) to f/u appt. She also plans to f/u with [facility] on her BP, overlapping her care.  She is a tough historian, so I prefer her to NOT continue to see both of [place] for the same problem.  F/u 3 months, sooner prn (high or low BP's or other problems).

## 2013-03-28 ENCOUNTER — Telehealth: Payer: Self-pay | Admitting: Internal Medicine

## 2013-03-28 NOTE — Telephone Encounter (Signed)
i have faxed medical records to [facility] branch @ [phone]

## 2013-04-28 ENCOUNTER — Telehealth: Payer: Self-pay

## 2013-04-28 NOTE — Telephone Encounter (Signed)
Should this be with an MD?  Just change recall.

## 2013-04-28 NOTE — Telephone Encounter (Signed)
Pt was due to come in for 65mth recheck of breast that was due for 4/14. Pt never came in & never got screening mammo due 2/14. Pt states she didn't get mammo done because it hurts. Pt scheduled to come in on 05-17-13 for debbie to recheck her breast. Pt aware of importance of getting her mammo done

## 2013-04-28 NOTE — Telephone Encounter (Signed)
New recall for 05-21-13 entered.  Should we send letter anyway or just wait till patient sees Debbi on 05-17-13.

## 2013-05-16 ENCOUNTER — Encounter: Payer: Self-pay | Admitting: Obstetrics and Gynecology

## 2013-05-17 ENCOUNTER — Encounter: Payer: Self-pay | Admitting: Nurse Practitioner

## 2013-05-17 ENCOUNTER — Ambulatory Visit: Payer: BC Managed Care – PPO | Admitting: Certified Nurse Midwife

## 2013-05-17 ENCOUNTER — Other Ambulatory Visit: Payer: Self-pay | Admitting: Nurse Practitioner

## 2013-05-17 ENCOUNTER — Ambulatory Visit (INDEPENDENT_AMBULATORY_CARE_PROVIDER_SITE_OTHER): Payer: BC Managed Care – PPO | Admitting: Nurse Practitioner

## 2013-05-17 VITALS — BP 126/84 | HR 88 | Ht 66.5 in | Wt 167.0 lb

## 2013-05-17 DIAGNOSIS — N6320 Unspecified lump in the left breast, unspecified quadrant: Secondary | ICD-10-CM

## 2013-05-17 DIAGNOSIS — N6321 Unspecified lump in the left breast, upper outer quadrant: Secondary | ICD-10-CM

## 2013-05-17 DIAGNOSIS — N63 Unspecified lump in unspecified breast: Secondary | ICD-10-CM

## 2013-05-17 NOTE — Progress Notes (Signed)
Encounter reviewed by Dr. Brook Silva.  

## 2013-05-17 NOTE — Progress Notes (Signed)
Appointment made for Mammogram. Appointment made with patient in office for  11/28 at 10:00 am patient agreeable to time/date.

## 2013-05-17 NOTE — Progress Notes (Signed)
Subjective:     Patient ID: Anna [name], female   DOB: 02-09-1961, 52 y.o.   MRN: [id number]  HPI  This  52 yo AA M Fe presents for a breast recheck on left.  At last AEX 07/2012 she had a pea size mobile mass at 1:00 position on left breast 1 finger   She then went for a diagnostic Mammo and ultrasound on left and results showed no mass. She is due for AEX with MS. Hollice Espy in January and follow up screening mammogram in Feb. 2015. She continues to feel the same area and was to return for follow up to reexamine this area.  So comes back today for a recheck.   Review of Systems  Constitutional: Negative for fever, chills, diaphoresis and fatigue.  HENT: Negative.   Respiratory: Negative.        Continues to smoke.  Cardiovascular: Negative.   Gastrointestinal: Negative.   Genitourinary: Negative.        S/P hysterectomy and having vaso symptoms daily.  Musculoskeletal: Negative.   Skin: Negative.   Neurological: Negative.   Psychiatric/Behavioral: Positive for sleep disturbance, dysphoric mood and decreased concentration. The patient is nervous/anxious.        Some marital and emotional problems from discord.       Objective:   Physical Exam  Constitutional: She is oriented to person, place, and time. She appears well-developed and well-nourished. No distress.  Genitourinary:  Bilateral breast exam with FCB changes.  Again is noted to have a pea size mass left breast at 1:00 position at 1 finger breath from the areola.  No pain. No nodes or nipple discharge.  No skin lesions.  Neurological: She is alert and oriented to person, place, and time.  Psychiatric: She has a normal mood and affect. Her behavior is normal. Judgment and thought content normal.       Assessment:     Persistent mass left breast at 1:00 position without changes.    Plan:     Since patient original exam was with Ms. Hollice Espy she was asked to feel this area as well.  There was no change from original size but  still present.  Patient is rescheduled for a diagnostic Mammo and [place]. Will follow with results.   She requested not to see Dr. Melanee Spry whom she saw at the last time at the [place].

## 2013-05-17 NOTE — Patient Instructions (Signed)
To have diagnostic mammogram on 06/03/13

## 2013-05-30 NOTE — Telephone Encounter (Signed)
Patient had AEX with patty an 05-17-13 and is scheduled for MMG and ultrasound on 06-01-13. Ok to complete current recall and enter for 06-05-13?

## 2013-05-31 NOTE — Telephone Encounter (Signed)
[place] recall completed and new recall in EPIC for 06-05-13 entered.

## 2013-05-31 NOTE — Telephone Encounter (Signed)
yes

## 2013-06-01 ENCOUNTER — Ambulatory Visit
Admission: RE | Admit: 2013-06-01 | Discharge: 2013-06-01 | Disposition: A | Discharge status: Home / Self Care | Payer: BC Managed Care – PPO | Source: Ambulatory Visit | Attending: Nurse Practitioner | Admitting: Nurse Practitioner

## 2013-06-01 DIAGNOSIS — N6321 Unspecified lump in the left breast, upper outer quadrant: Secondary | ICD-10-CM

## 2013-06-01 DIAGNOSIS — N6320 Unspecified lump in the left breast, unspecified quadrant: Secondary | ICD-10-CM

## 2013-06-03 ENCOUNTER — Other Ambulatory Visit: Payer: BC Managed Care – PPO

## 2013-06-17 ENCOUNTER — Encounter: Payer: Self-pay | Admitting: Nurse Practitioner

## 2013-06-17 ENCOUNTER — Ambulatory Visit (INDEPENDENT_AMBULATORY_CARE_PROVIDER_SITE_OTHER): Payer: BC Managed Care – PPO | Admitting: Nurse Practitioner

## 2013-06-17 VITALS — BP 133/91 | HR 102 | Ht 66.5 in | Wt 174.0 lb

## 2013-06-17 DIAGNOSIS — G43009 Migraine without aura, not intractable, without status migrainosus: Secondary | ICD-10-CM

## 2013-06-17 NOTE — Patient Instructions (Signed)
Continue current meds Increase water intake F/U yearly and prn

## 2013-06-17 NOTE — Progress Notes (Signed)
[facility]  PATIENT: Anna [name] DOB: 11-18-1960   REASON FOR VISIT: Followup for migraine    HISTORY OF PRESENT ILLNESS: Anna [name] is a 52 year old black female returns for followup. She has a history of migraines and has been on gabapentin and Robaxin in the past both that were beneficial however she is no longer taking his medications. Her headaches seem to be in fairly good control. Her biggest complaint is constipation for which she takes Colace. Her hypertension is under better control with the change in her blood pressure medicines. She also has history of lupus, bipolar disorder and hyperlipidemia. She is stable from a neurologic standpoint.   HISTORY: headaches, dizziness, and visual disturbance. MRI study of the brain 12/07/2003 and intracranial MRA 12/11/2003 were normal. She had a history of dizziness that sounded as if it was true vertigo. She had double vision on red lens testing and was seen by Dr. Jolyn Nap at [facility] and Dr. 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 TSH was low at 0.012, sedimentation rate was 4, CPK 88, serum for acetylcholine receptor antibodies was normal, and hemoglobin A1c was 5.6. Dr Erling Cruz saw her again 04/29/2007 for double vision. She was seen by Dr. Knute Neu and diagnosed with systemic lupus erythematosus in 2008. 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 She was seen again 08/12/2007 given a trial of clonazepam for convergence spasm. Her double vision is side-by-side and intermittent. She complains of intermittent lightheaded or dizziness worse with standing up or when walking lasting seconds to a few minutes. She has noted headaches that are vague in location. She states she has a lot of stress in her life. Her MRI of the brain in December 2010 was normal.  TODAY: 12/09/12: Recent admission to the ER for dizziness and  lightheadedness. Blood pressure noted to be extremely elevated. Her Benicar was increased by Dr. Tomi Bamberger. No recent headaches. 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 Has not followed up with Dr. Katy Fitch she was given bifocal glasses but she does not wear them. She is trying to get disability. She has Lupus. She recently was diagnosed with asthma. She sees Dr. Candis Schatz for bipolar disorder.She has been referred to vestibular rehab. She is driving.    REVIEW OF SYSTEMS: Full 14 system review of systems performed and notable only for those listed, all others are neg:  Constitutional: N/A  Cardiovascular: N/A  Ear/Nose/Throat: N/A  Skin: N/A  Eyes: N/A  Respiratory: N/A  Gastroitestinal: N/A  Hematology/Lymphatic: N/A  Endocrine: N/A Musculoskeletal:N/A  Allergy/Immunology: N/A  Neurological: N/A Psychiatric: N/A   ALLERGIES: No Known Allergies  HOME MEDICATIONS: Outpatient Prescriptions Prior to Visit  Medication Sig Dispense Refill  . amLODipine (NORVASC) 5 MG tablet Take 1 tablet by mouth daily.      . Aspirin-Acetaminophen-Caffeine (GOODYS EXTRA STRENGTH PO) Take by mouth.      . Aspirin-Caffeine (BAYER BACK & BODY PAIN EX ST PO) Take 2 tablets by mouth 2 (two) times daily.      . carbamazepine (TEGRETOL) 200 MG tablet Take 200 mg by mouth 3 (three) times daily.        . diazepam (VALIUM) 10 MG tablet Take 10 mg by mouth every 8 (eight) hours as needed for anxiety or sleep.       [name] docusate sodium (COLACE) 100 MG capsule Take 100 mg by mouth  3 (three) times daily as needed for constipation.      [name] escitalopram (LEXAPRO) 20 MG tablet Take 20 mg by mouth daily.      . hydroxychloroquine (PLAQUENIL) 200 MG tablet Take 200 mg by mouth 2 (two) times daily.        [name] levothyroxine (SYNTHROID) 75 MCG tablet Take 75 mcg by mouth daily.       [name] losartan-hydrochlorothiazide (HYZAAR) 100-12.5 MG per tablet Take 1 tablet by mouth daily.  30 tablet  2  .  methocarbamol (ROBAXIN) 500 MG tablet Take 500 mg by mouth 3 (three) times daily.      . predniSONE (DELTASONE) 5 MG tablet Take 10 mg by mouth daily as needed (for flares).       . QUEtiapine (SEROQUEL) 300 MG tablet Take 300-600 mg by mouth at bedtime.      . ranitidine (ZANTAC) 75 MG tablet Take 75 mg by mouth 2 (two) times daily.      . traMADol (ULTRAM) 50 MG tablet Take 50 mg by mouth 2 (two) times daily.      . Vitamin D, Ergocalciferol, (DRISDOL) 50000 UNITS CAPS capsule Take 1 capsule by mouth once a week.       No facility-administered medications prior to visit.    PAST MEDICAL HISTORY: Past Medical History  Diagnosis Date  . Hypertension   . Hypothyroid   . Lupus (systemic lupus erythematosus)   . Depressive disorder, not elsewhere classified   . Pure hypercholesterolemia   . Kidney stone   . Migraine     PAST SURGICAL HISTORY: Past Surgical History  Procedure Laterality Date  . Total abdominal hysterectomy      ovaries retained, DUB  . Cesarean section    . Tonsillectomy    . Lumbar disc surgery  2003  . Ectopic pregnancy surgery      FAMILY HISTORY: Family History  Problem Relation Age of Onset  . Autoimmune disease Neg Hx     SOCIAL HISTORY: History   Social History  . Marital Status: Married    Spouse Name: Eddie    Number of Children: 1  . Years of Education: 15   Occupational History  .     Social History Main Topics  . Smoking status: Current Every Day Smoker -- 1.00 packs/day for 35 years    Types: Cigarettes  . Smokeless tobacco: Never Used  . Alcohol Use: No  . Drug Use: No  . Sexual Activity: Not on file   Other Topics Concern  . 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   Patient has one son, lives in [place]   Patient is disabled.   Patient has a college education.   Patient is right-handed.   Patient drinks some caffeine.     PHYSICAL EXAM  Filed Vitals:    06/17/13 1032  BP: 133/91  Pulse: 102  Height: 5' 6.5" (1.689 m)  Weight: 174 lb (78.926 kg)   Body mass index is 27.67 kg/(m^2).  Generalized: Well developed, in no acute distress   Neurological examination   Mentation: Alert oriented to time, place, history taking. Follows all commands speech and language fluent  Cranial nerve II-XII: Pupils were equal round reactive to light extraocular movements were full, visual field were full on confrontational test. Facial sensation and strength were normal. hearing was intact to finger rubbing bilaterally. Uvula tongue midline. head turning and shoulder  shrug were normal and symmetric.Tongue protrusion into cheek strength was normal.Has evidence of accommodative spasm.  Motor: normal bulk and tone, full strength in the BUE, BLE, fine finger movements normal, no pronator drift. No focal weakness Coordination: finger-nose-finger, heel-to-shin bilaterally, no dysmetria Reflexes: Brachioradialis 2/2, biceps 2/2, triceps 2/2, patellar 2/2, Achilles 2/2, plantar responses were flexor bilaterally. Gait and Station: Rising up from seated position without assistance, normal stance,  moderate stride, good arm swing, smooth turning, able to perform tiptoe, and heel walking without difficulty. Tandem gait is steady  DIAGNOSTIC DATA (LABS, IMAGING, TESTING) - I reviewed patient records, labs, notes, testing and imaging myself where available.  Lab Results  Component Value Date   WBC 5.7 12/01/2012   HGB 12.1 12/01/2012   HCT 33.5* 12/01/2012   MCV 96.8 12/01/2012   PLT 323 12/01/2012      Component Value Date/Time   NA 136 01/10/2013 1234   K 4.7 01/10/2013 1234   CL 106 01/10/2013 1234   CO2 25 01/10/2013 1234   GLUCOSE 87 01/10/2013 1234   BUN 23 01/10/2013 1234   CREATININE 1.17* 01/10/2013 1234   CREATININE 1.10 12/01/2012 1641   CALCIUM 9.0 01/10/2013 1234   PROT 6.9 01/10/2013 1234   ALBUMIN 4.1 01/10/2013 1234   AST 14 01/10/2013 1234   ALT <8 01/10/2013 1234    ALKPHOS 83 01/10/2013 1234   BILITOT 0.2* 01/10/2013 1234   GFRNONAA 56* 12/01/2012 1641   GFRAA 65* 12/01/2012 1641   Lab Results  Component Value Date   CHOL 186 01/10/2013   HDL 35* 01/10/2013   LDLCALC 112* 01/10/2013   TRIG 194* 01/10/2013   CHOLHDL 5.3 01/10/2013     Lab Results  Component Value Date   TSH 0.729 01/10/2013      ASSESSMENT AND PLAN  52 y.o. year old female  has a past medical history of Hypertension; Hypothyroid; Lupus (systemic lupus erythematosus); Depressive disorder, not elsewhere classified; Pure hypercholesterolemia; Kidney stone; and Migraine. here to followup. Her migraines are in good control and she is currently not taking any medication as she has stopped her Robaxin and her gabapentin in the past.  Continue current meds, none prescribed from this office.  Increase water intake F/U yearly and prn Dennie Bible, [facility], [facility], APRN  [facility] [address] [place], [place] [address] [phone]

## 2013-06-19 ENCOUNTER — Other Ambulatory Visit: Payer: Self-pay | Admitting: Family Medicine

## 2013-06-21 NOTE — Progress Notes (Signed)
I reviewed note and agree with plan.   Penni Bombard, MD [id number], [id number] AM Certified in Neurology, Neurophysiology and Neuroimaging  [facility] [address], [place], [place] [address] [phone]

## 2013-07-17 ENCOUNTER — Other Ambulatory Visit: Payer: Self-pay | Admitting: Family Medicine

## 2013-07-18 ENCOUNTER — Other Ambulatory Visit: Payer: Self-pay | Admitting: *Deleted

## 2013-07-18 MED ORDER — AMLODIPINE BESYLATE 5 MG PO TABS: 5.0000 mg | ORAL_TABLET | Freq: Every day | ORAL | Status: DC

## 2013-07-18 NOTE — Telephone Encounter (Signed)
Called patient to get med check scheduled within the next 30 days, she stated that she will call back when she gets home.

## 2013-07-20 ENCOUNTER — Encounter: Payer: Self-pay | Admitting: Certified Nurse Midwife

## 2013-07-20 ENCOUNTER — Ambulatory Visit (INDEPENDENT_AMBULATORY_CARE_PROVIDER_SITE_OTHER): Payer: BC Managed Care – PPO | Admitting: Certified Nurse Midwife

## 2013-07-20 VITALS — BP 104/64 | HR 68 | Resp 16 | Ht 66.25 in | Wt 171.0 lb

## 2013-07-20 DIAGNOSIS — Z01419 Encounter for gynecological examination (general) (routine) without abnormal findings: Secondary | ICD-10-CM

## 2013-07-20 DIAGNOSIS — Z0000 Encounter for general adult medical examination without abnormal findings: Secondary | ICD-10-CM

## 2013-07-20 LAB — POCT URINALYSIS DIPSTICK
Bilirubin, UA: NEGATIVE
Blood, UA: NEGATIVE
Glucose, UA: NEGATIVE
KETONES UA: NEGATIVE
LEUKOCYTES UA: NEGATIVE
Nitrite, UA: NEGATIVE
PH UA: 5
PROTEIN UA: NEGATIVE
Urobilinogen, UA: NEGATIVE

## 2013-07-20 NOTE — Progress Notes (Signed)
53 y.o. G68P1020 Married African American Fe here for annual exam. Menopausal no HRT. Denies vaginal bleeding. Patient having some vaginal dryness, had used vaseline, but stopped.Spouse supportive. Patient sees PCP for hypertension management and medication management.Sees Dr Lavone Neri now. No medication changes in past year. Has decreased smoking to less than 1/4 pack! Feels better, no coughing now. No health issues today.   Patient's last menstrual period was 07/07/2002.          Sexually active: yes  The current method of family planning is status post hysterectomy.    Exercising: yes  walking Smoker:  yes  Health Maintenance: Pap:  07-12-10 neg MMG:  05/17/13 & left breast u/s Colonoscopy: 2010 normal 10 years BMD:   2011 TDaP:  2007 Labs: Poct urine-neg Self breast exam: done monthly   reports that she has been smoking Cigarettes.  She has a 17.5 pack-year smoking history. She has never used smokeless tobacco. She reports that she does not drink alcohol or use illicit drugs.  Past Medical History  Diagnosis Date  . Hypertension   . Hypothyroid   . Lupus (systemic lupus erythematosus)   . Depressive disorder, not elsewhere classified   . Pure hypercholesterolemia   . Kidney stone   . Migraine   . Stroke     Past Surgical History  Procedure Laterality Date  . Cesarean section    . Tonsillectomy    . Lumbar disc surgery  2003  . Ectopic pregnancy surgery      times 2  . Total abdominal hysterectomy  2004    ovaries retained, DUB  . Laparoscopy      with rt salpingectomy  . Tubal ligation  1985    Current Outpatient Prescriptions  Medication Sig Dispense Refill  . amLODipine (NORVASC) 5 MG tablet Take 1 tablet (5 mg total) by mouth daily.  30 tablet  0  . Aspirin-Acetaminophen-Caffeine (GOODYS EXTRA STRENGTH PO) Take by mouth.      . Aspirin-Caffeine (BAYER BACK & BODY PAIN EX ST PO) Take 2 tablets by mouth 2 (two) times daily.      . carbamazepine (TEGRETOL) 200 MG  tablet Take 200 mg by mouth 3 (three) times daily.        . diazepam (VALIUM) 10 MG tablet Take 10 mg by mouth every 8 (eight) hours as needed for anxiety or sleep.       [name] escitalopram (LEXAPRO) 20 MG tablet Take 20 mg by mouth daily.      . hydroxychloroquine (PLAQUENIL) 200 MG tablet Take 200 mg by mouth 2 (two) times daily.        [name] levothyroxine (SYNTHROID) 75 MCG tablet Take 75 mcg by mouth daily.       [name] losartan-hydrochlorothiazide (HYZAAR) 100-12.5 MG per tablet TAKE 1 TABLET BY MOUTH DAILY.  30 tablet  0  . methocarbamol (ROBAXIN) 500 MG tablet Take 500 mg by mouth 3 (three) times daily.      . predniSONE (DELTASONE) 5 MG tablet Take 10 mg by mouth daily as needed (for flares).       . QUEtiapine (SEROQUEL) 300 MG tablet Take 300-600 mg by mouth at bedtime.      . ranitidine (ZANTAC) 75 MG tablet Take 75 mg by mouth 2 (two) times daily.      . traMADol (ULTRAM) 50 MG tablet Take 50 mg by mouth 2 (two) times daily.       No current facility-administered medications for this visit.    Family  History  Problem Relation Age of Onset  . Autoimmune disease Neg Hx   . Diabetes Mother   . Hypertension Sister   . Diabetes Maternal Grandmother     ROS:  Pertinent items are noted in HPI.  Otherwise, a comprehensive ROS was negative.  Exam:   BP 104/64  Pulse 68  Resp 16  Ht 5' 6.25" (1.683 m)  Wt 171 lb (77.565 kg)  BMI 27.38 kg/m2  LMP 07/07/2002 Height: 5' 6.25" (168.3 cm)  Ht Readings from Last 3 Encounters:  07/20/13 5' 6.25" (1.683 m)  06/17/13 5' 6.5" (1.689 m)  05/17/13 5' 6.5" (1.689 m)    General appearance: alert, cooperative and appears stated age Head: Normocephalic, without obvious abnormality, atraumatic Neck: no adenopathy, supple, symmetrical, trachea midline and thyroid normal to inspection and palpation Lungs: clear to auscultation bilaterally Breasts: normal appearance, no masses or tenderness, No nipple retraction or dimpling, No nipple discharge or  bleeding, No axillary or supraclavicular adenopathy Heart: regular rate and rhythm Abdomen: soft, non-tender; no masses,  no organomegaly Extremities: extremities normal, atraumatic, no cyanosis or edema Skin: Skin color, texture, turgor normal. No rashes or lesions Lymph nodes: Cervical, supraclavicular, and axillary nodes normal. No abnormal inguinal nodes palpated Neurologic: Grossly normal   Pelvic: External genitalia:  no lesions              Urethra:  normal appearing urethra with no masses, tenderness or lesions              Bartholin's and Skene's: normal                 Vagina: normal appearing vagina with normal color and discharge, no lesions              Cervix: absent              Pap taken: no Bimanual Exam:  Uterus:  uterus absent              Adnexa: normal adnexa and no mass, fullness, tenderness               Rectovaginal: Confirms               Anus:  normal sphincter tone, no lesions  A:  Well Woman with normal exam  Menopausal no HRT s/p TAH with ovaries retained due to DUB,chronic pain  Vaginal dryness  Hypertension/Hypothyroid/Depression stable on medication with PCP  Smoking cessation program in progress   P:   Reviewed health and wellness pertinent to exam  Discussed options for treatment, estrogen, OTC options. Patient feels Olive Oil good choice. Discussed should not use vaseline in vagina, but external use OK. Questions addressed.  Continue follow up with PCP.  Congratulated on her choice to stop smoking and encouraged to continue!  Pap smear as per guidelines   Mammogram yearly pap smear  Not taken today counseled on breast self exam, mammography screening, osteoporosis, adequate intake of calcium and vitamin D, diet and exercise, BMD due patient will call to schedule her preference.  return annually or prn  An After Visit Summary was printed and given to the patient.

## 2013-07-20 NOTE — Patient Instructions (Signed)

## 2013-07-22 NOTE — Progress Notes (Signed)
Reviewed personally.  M. Suzanne Aftin Lye, MD.  

## 2013-08-16 ENCOUNTER — Other Ambulatory Visit: Payer: Self-pay | Admitting: Family Medicine

## 2013-08-17 ENCOUNTER — Telehealth: Payer: Self-pay | Admitting: *Deleted

## 2013-08-17 NOTE — Telephone Encounter (Signed)
Called patient again this month as I did last month and asked her to schedule OV with Dr.Knapp as she was supposed to be seen in December for her 3 month follow up. I refilled her bp meds again for 30 days and told her she needs to schedule an appt within the next 30 days. She stated that she will call back.

## 2013-09-14 ENCOUNTER — Other Ambulatory Visit: Payer: Self-pay | Admitting: Family Medicine

## 2013-09-15 ENCOUNTER — Ambulatory Visit (INDEPENDENT_AMBULATORY_CARE_PROVIDER_SITE_OTHER): Payer: BC Managed Care – PPO | Admitting: Family Medicine

## 2013-09-15 ENCOUNTER — Encounter: Payer: Self-pay | Admitting: Family Medicine

## 2013-09-15 VITALS — BP 128/82 | HR 76 | Ht 66.5 in | Wt 168.0 lb

## 2013-09-15 DIAGNOSIS — I10 Essential (primary) hypertension: Secondary | ICD-10-CM

## 2013-09-15 NOTE — Progress Notes (Signed)
Chief Complaint  Patient presents with  . Hypertension    follow up, med check.   She has seen Dr. Lavone Neri (family med)--located off of [address] when her BP's were fluctuating, around the time she was having high BP's while at PT.  Dr. Lavone Neri changed her BP meds.  She changed her from Benicar to amlodipine and hyzaar. She was on this regimen at her last visit in September.  She transferred her records there, and plans to f/u there with Dr. Lavone Neri as her PCP.  She states that Dr. Lavone Neri told her she should have been sent to ER on another day that rehab called with high BP.   Her sister was upset that we didn't change her meds sooner or send her to the ER, and she wants patient to stay with Dr. Lavone Neri.  She is only here today, because apparently the pharmacy made an error and called [place] for BP med refills yesterday, rather than Dr. Lavone Neri.  Verdene Lennert tried to clarify this with the patient on the phone, but we refilled her med and asked her to come for med check (since she wasn't clear that she was no longer coming here or to be getting refills from [place]).  She states that overall she is doing just okay.  She has lupus and has pain in her arms, legs.  Has "good days and bad days".  She has been more depressed lately.  She has recently seen Dr. Candis Schatz for management of her depression  Past Medical History  Diagnosis Date  . Hypertension   . Hypothyroid     Dr. Wilson Singer  . Lupus (systemic lupus erythematosus)   . Depressive disorder, not elsewhere classified   . Pure hypercholesterolemia   . Kidney stone   . Migraine   . Stroke    Past Surgical History  Procedure Laterality Date  . Cesarean section    . Tonsillectomy    . Lumbar disc surgery  2003  . Ectopic pregnancy surgery      times 2  . Total abdominal hysterectomy  2004    ovaries retained, DUB  . Laparoscopy      with rt salpingectomy  . Tubal ligation  1985   History   Social History  . Marital Status: Married    Spouse Name: Eddie     Number of Children: 1  . Years of Education: 15   Occupational History  .     Social History Main Topics  . Smoking status: Current Every Day Smoker -- 0.50 packs/day for 35 years    Types: Cigarettes  . Smokeless tobacco: Never Used  . Alcohol Use: Yes     Comment: 1 drink every 3-4 months.  . Drug Use: No  . Sexual Activity: Yes    Partners: Male    Birth Control/ Protection: Surgical     Comment: TAH   Other Topics Concern  . 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   Patient has one son, lives in [place].   Patient is disabled.   Patient has a college education.   Patient is right-handed.   Patient drinks some caffeine.   Outpatient Encounter Prescriptions as of 09/15/2013  Medication Sig  . amLODipine (NORVASC) 5 MG tablet TAKE 1 TABLET (5 MG TOTAL) BY MOUTH DAILY.  . carbamazepine (TEGRETOL) 200 MG tablet Take 200 mg by mouth 3 (three) times daily.    [name]  diazepam (VALIUM) 10 MG tablet Take 10 mg by mouth every 8 (eight) hours as needed for anxiety or sleep.   [name] escitalopram (LEXAPRO) 20 MG tablet Take 20 mg by mouth daily.  . hydroxychloroquine (PLAQUENIL) 200 MG tablet Take 200 mg by mouth 2 (two) times daily.    [name] levothyroxine (SYNTHROID) 75 MCG tablet Take 75 mcg by mouth daily.   [name] losartan-hydrochlorothiazide (HYZAAR) 100-12.5 MG per tablet TAKE 1 TABLET BY MOUTH DAILY.  . methocarbamol (ROBAXIN) 500 MG tablet Take 500 mg by mouth as needed.   [name] QUEtiapine (SEROQUEL) 300 MG tablet Take 300-600 mg by mouth at bedtime.  . traMADol (ULTRAM) 50 MG tablet Take 50 mg by mouth 2 (two) times daily.  . Aspirin-Acetaminophen-Caffeine (GOODYS EXTRA STRENGTH PO) Take by mouth.  . Aspirin-Caffeine (BAYER BACK & BODY PAIN EX ST PO) Take 2 tablets by mouth 2 (two) times daily.  . predniSONE (DELTASONE) 5 MG tablet Take 10 mg by mouth daily as needed (for flares).   . ranitidine (ZANTAC) 75 MG tablet Take 75 mg  by mouth 2 (two) times daily.   No Known Allergies  ROS:  Denies fevers, chills, chest pain.  Denies headaches, occasionally feels dizzy.  No shortness of breath.  +Nausea, some vomiting.  +constipation--has been eating prunes.  +depression, +joint pains.  PHYSICAL EXAM: BP 128/82  Pulse 76  Ht 5' 6.5" (1.689 m)  Wt 168 lb (76.204 kg)  BMI 26.71 kg/m2  LMP 07/07/2002  Well developed female in no distress Normal, somewhat slowed speech.  Normal eye contact, hygiene and grooming Flat affect. Heart: regular rate and rhythm Lungs: clear  ASSESSMENT/PLAN:  Essential hypertension, benign - well controlled on current meds.  She prefers to see Dr. Lavone Neri as her PCP.  meds were r/f yest x 30 days.    I did review her vital sign flowsheet with her, showing her the fluctuations that are seen.  That her BP was fine in May, just prior to the BP lability, on the same meds, and she had recent higher/borderline BP (December) in the flowsheet on her current meds.  We reviewed causes of fluctuations in BP (pain, stress, salt intake, etc).  She (and her sister) feel more comfortable with Dr. Lavone Neri as her PCP.

## 2013-09-15 NOTE — Patient Instructions (Signed)
Your blood pressure medication was refilled yesterday for a 30 days supply of both medications. I will let Dr. Lavone Neri refill it in the future, and do all appropriate bloodwork, as she is now your primary care doctor.  You will need to be in touch with the pharmacy and let them know to ask Dr. Lavone Neri for all refills

## 2013-09-22 DIAGNOSIS — Z029 Encounter for administrative examinations, unspecified: Secondary | ICD-10-CM

## 2013-09-28 ENCOUNTER — Telehealth: Payer: Self-pay | Admitting: Nurse Practitioner

## 2013-09-28 MED ORDER — METHOCARBAMOL 500 MG PO TABS: ORAL_TABLET | ORAL | Status: DC

## 2013-09-28 NOTE — Telephone Encounter (Signed)
Patient walked in asking for a refill of her headache medicine

## 2013-09-28 NOTE — Telephone Encounter (Signed)
Last OV Says:  Her migraines are in good control and she is currently not taking any medication as she has stopped her Robaxin and her gabapentin in the past.  Continue current meds, none prescribed from this office.   I called the patient.  She says her headaches are back and she would like Hoyle Sauer to start prescribing Robaxin again.  Please advise.  Thank you.

## 2013-09-28 NOTE — Telephone Encounter (Signed)
OK with me, I think Dr. Erling Cruz had put her on that

## 2013-09-28 NOTE — Telephone Encounter (Signed)
Rx has been sent.  I called the patient.  She is aware.  

## 2013-09-28 NOTE — Telephone Encounter (Signed)
Patient walked into the lobby asking for refills of Robaxin - [facility] [address].

## 2013-10-11 ENCOUNTER — Other Ambulatory Visit: Payer: Self-pay | Admitting: Family Medicine

## 2014-01-09 ENCOUNTER — Telehealth: Payer: Self-pay | Admitting: Internal Medicine

## 2014-01-09 NOTE — Telephone Encounter (Signed)
Faxed over medical records to [facility] @ [phone]

## 2014-01-10 DIAGNOSIS — Z029 Encounter for administrative examinations, unspecified: Secondary | ICD-10-CM

## 2014-04-13 ENCOUNTER — Other Ambulatory Visit: Payer: Self-pay | Admitting: Family Medicine

## 2014-05-06 ENCOUNTER — Other Ambulatory Visit: Payer: Self-pay | Admitting: Family Medicine

## 2014-05-08 ENCOUNTER — Encounter: Payer: Self-pay | Admitting: Family Medicine

## 2014-05-15 ENCOUNTER — Other Ambulatory Visit: Payer: Self-pay | Admitting: Family Medicine

## 2014-05-19 ENCOUNTER — Other Ambulatory Visit: Payer: Self-pay | Admitting: Family Medicine

## 2014-05-26 ENCOUNTER — Telehealth: Payer: Self-pay | Admitting: *Deleted

## 2014-05-26 ENCOUNTER — Other Ambulatory Visit: Payer: Self-pay | Admitting: Nurse Practitioner

## 2014-05-26 DIAGNOSIS — R921 Mammographic calcification found on diagnostic imaging of breast: Secondary | ICD-10-CM

## 2014-05-26 NOTE — Telephone Encounter (Addendum)
Called pt to schedule MMG.  In Recall for: Palpable abnormality Left Breast. Last MMG 05/2013 BIRADS3: Probably Benign. RECOMMENDATION: A six-month followup diagnostic left mammogram with magnification views is recommended for the probably benign left breast Calcifications.  Patient agrees for me to schedule appt. [facility].  Patient prefers beginign of the year, any day around 10am.

## 2014-05-26 NOTE — Telephone Encounter (Signed)
[facility] Diagnostic Bilateral Appt Tuesday 07/11/2014 @10 :00 am. Please arrive 15 minutes early.  -Recall not in system. - New recall entered for 08/06/14. - LM for pt to call back and go over appt details.  - Routed to Dr. Sabra Heck for final review.  CC: Gay Filler

## 2014-05-27 NOTE — Telephone Encounter (Signed)
Agree.  Can close encounter closed.

## 2014-06-05 NOTE — Telephone Encounter (Signed)
Called pt was notified of appt info.

## 2014-06-19 ENCOUNTER — Encounter: Payer: Self-pay | Admitting: Diagnostic Neuroimaging

## 2014-06-19 ENCOUNTER — Ambulatory Visit (INDEPENDENT_AMBULATORY_CARE_PROVIDER_SITE_OTHER): Payer: BC Managed Care – PPO | Admitting: Diagnostic Neuroimaging

## 2014-06-19 VITALS — BP 153/103 | HR 86 | Ht 66.5 in | Wt 168.6 lb

## 2014-06-19 DIAGNOSIS — G43009 Migraine without aura, not intractable, without status migrainosus: Secondary | ICD-10-CM

## 2014-06-19 NOTE — Progress Notes (Signed)
[facility]  PATIENT: Anna [name] DOB: 03-Jun-1961   REASON FOR VISIT: Followup for migraine    HISTORY OF PRESENT ILLNESS:   UPDATE 06/19/14: Since last visit, has had more migraines: 2-3 per week, throbbing, with nausea, photo/phonophobia. She uses robaxin and goody's back/body powder for relief. Still with diffuse arthritis pain. Feels like her lupus is flaring up. Sees Dr. Trudie Reed for lupus. Sees Dr. Lavone Neri and Dr. Tomi Bamberger for PCP.   UPDATE 06/17/13 (CM): History of migraines and has been on gabapentin and Robaxin in the past both that were beneficial however she is no longer taking his medications. Her headaches seem to be in fairly good control. Her biggest complaint is constipation for which she takes Colace. Her hypertension is under better control with the change in her blood pressure medicines. She also has history of lupus, bipolar disorder and hyperlipidemia. She is stable from a neurologic standpoint.  UPDATE 12/09/12 (CM): Recent admission to the ER for dizziness and lightheadedness. Blood pressure noted to be extremely elevated. Her Benicar was increased by Dr. Tomi Bamberger. No recent headaches. 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 Has not followed up with Dr. Katy Fitch she was given bifocal glasses but she does not wear them. She is trying to get disability. She has Lupus. She recently was diagnosed with asthma. She sees Dr. Candis Schatz for bipolar disorder.She has been referred to vestibular rehab. She is driving.   PRIOR HPI (Dr. Erling Cruz): Long history of headaches, dizziness, and visual disturbance. MRI study of the brain 12/07/2003 and intracranial MRA 12/11/2003 were normal. She had a history of dizziness that sounded as if it was true vertigo. She had double vision on red lens testing and was seen by Dr. Jolyn Nap at [facility] and Dr. Philis Kendall in [place] with evidence of convergent spasm  on examination. TSH was low at 0.012, sedimentation rate was 4, CPK 88, serum for acetylcholine receptor antibodies was normal, and hemoglobin A1c was 5.6. Dr Erling Cruz saw her again 04/29/2007 for double vision. She was seen by Dr. Knute Neu and diagnosed with systemic lupus erythematosus in 2008. 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 She was seen again 08/12/2007 given a trial of clonazepam for convergence spasm. Her double vision is side-by-side and intermittent. She complains of intermittent lightheaded or dizziness worse with standing up or when walking lasting seconds to a few minutes. She has noted headaches that are vague in location. She states she has a lot of stress in her life. Her MRI of the brain in December 2010 was normal.     REVIEW OF SYSTEMS: Full 14 system review of systems performed and notable only for double vision, wheezing restless legs dizziness confusion numbness depression anxiety aching muscles joint pain constipation rash itching.     ALLERGIES: No Known Allergies  HOME MEDICATIONS: Outpatient Prescriptions Prior to Visit  Medication Sig Dispense Refill  . amLODipine (NORVASC) 5 MG tablet TAKE 1 TABLET (5 MG TOTAL) BY MOUTH DAILY. 30 tablet 5  . Aspirin-Acetaminophen-Caffeine (GOODYS EXTRA STRENGTH PO) Take by mouth.    . Aspirin-Caffeine (BAYER BACK & BODY PAIN EX ST PO) Take 2 tablets by mouth 2 (two) times daily.    . carbamazepine (TEGRETOL) 200 MG tablet Take 200 mg by mouth 3 (three) times daily.      . diazepam (VALIUM) 10 MG tablet Take 10 mg by mouth every 8 (eight)  hours as needed for anxiety or sleep.     [name] escitalopram (LEXAPRO) 20 MG tablet Take 20 mg by mouth daily.    . hydroxychloroquine (PLAQUENIL) 200 MG tablet Take 200 mg by mouth 2 (two) times daily.      [name] levothyroxine (SYNTHROID) 75 MCG tablet Take 75 mcg by mouth daily.     . methocarbamol (ROBAXIN) 500 MG tablet One tablet up to three times daily as  needed 90 tablet 6  . QUEtiapine (SEROQUEL) 300 MG tablet Take 300-600 mg by mouth at bedtime.    . traMADol (ULTRAM) 50 MG tablet Take 50 mg by mouth 2 (two) times daily.    [name] losartan-hydrochlorothiazide (HYZAAR) 100-12.5 MG per tablet TAKE 1 TABLET BY MOUTH DAILY. (Patient not taking: Reported on 06/19/2014) 30 tablet 5  . predniSONE (DELTASONE) 5 MG tablet Take 10 mg by mouth daily as needed (for flares).     . ranitidine (ZANTAC) 75 MG tablet Take 75 mg by mouth 2 (two) times daily.     No facility-administered medications prior to visit.    PAST MEDICAL HISTORY: Past Medical History  Diagnosis Date  . Hypertension   . Hypothyroid     Dr. Wilson Singer  . Lupus (systemic lupus erythematosus)   . Depressive disorder, not elsewhere classified   . Pure hypercholesterolemia   . Kidney stone   . Migraine   . Stroke     PAST SURGICAL HISTORY: Past Surgical History  Procedure Laterality Date  . Cesarean section    . Tonsillectomy    . Lumbar disc surgery  2003  . Ectopic pregnancy surgery      times 2  . Total abdominal hysterectomy  2004    ovaries retained, DUB  . Laparoscopy      with rt salpingectomy  . Tubal ligation  1985    FAMILY HISTORY: Family History  Problem Relation Age of Onset  . Autoimmune disease Neg Hx   . Diabetes Mother   . Hypertension Sister   . Diabetes Maternal Grandmother     SOCIAL HISTORY: History   Social History  . Marital Status: Married    Spouse Name: Eddie    Number of Children: 1  . Years of Education: 15   Occupational History  .     Social History Main Topics  . Smoking status: Current Every Day Smoker -- 0.50 packs/day for 35 years    Types: Cigarettes  . Smokeless tobacco: Never Used  . Alcohol Use: Yes     Comment: 1 drink every 3-4 months.  . Drug Use: No  . Sexual Activity:    Partners: Male    Birth Control/ Protection: Surgical     Comment: TAH   Other Topics Concern  . 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   Patient has one son, lives in [place].   Patient is disabled.   Patient has a college education.   Patient is right-handed.   Patient drinks some caffeine.     PHYSICAL EXAM  Filed Vitals:   06/19/14 0920  BP: 153/103  Pulse: 86  Height: 5' 6.5" (1.689 m)  Weight: 168 lb 9.6 oz (76.476 kg)   Body mass index is 26.81 kg/(m^2).  GENERAL EXAM: Patient is in MODERATE DISTRESS, EYES CLOSED, ROCKING, RESTLESS. 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 MOTOR: normal bulk and tone, DIFFUSE 4/5 STRENGHT, LIMITED BY PAIN SENSORY: normal and symmetric to light touch COORDINATION: finger-nose-finger, fine finger movements SLOW REFLEXES: deep tendon reflexes 1+ and symmetric GAIT/STATION: narrow based gait; ANTALGIC, SLOW    DIAGNOSTIC DATA (LABS, IMAGING, TESTING) - I reviewed patient records, labs, notes, testing and imaging myself where available.  Lab Results  Component Value Date   WBC 5.7 12/01/2012   HGB 12.1 12/01/2012   HCT 33.5* 12/01/2012   MCV 96.8 12/01/2012   PLT 323 12/01/2012      Component Value Date/Time   NA 136 01/10/2013 1234   K 4.7 01/10/2013 1234   CL 106 01/10/2013 1234   CO2 25 01/10/2013 1234   GLUCOSE 87 01/10/2013 1234   BUN 23 01/10/2013 1234   CREATININE 1.17* 01/10/2013 1234    CREATININE 1.10 12/01/2012 1641   CALCIUM 9.0 01/10/2013 1234   PROT 6.9 01/10/2013 1234   ALBUMIN 4.1 01/10/2013 1234   AST 14 01/10/2013 1234   ALT <8 01/10/2013 1234   ALKPHOS 83 01/10/2013 1234   BILITOT 0.2* 01/10/2013 1234   GFRNONAA 56* 12/01/2012 1641   GFRAA 65* 12/01/2012 1641   Lab Results  Component Value Date   CHOL 186 01/10/2013   HDL 35* 01/10/2013   LDLCALC 112* 01/10/2013   TRIG 194* 01/10/2013   CHOLHDL 5.3 01/10/2013     Lab Results  Component Value Date   TSH 0.729 01/10/2013      ASSESSMENT AND PLAN  53 y.o. year old female  has a past medical history of Hypertension; Hypothyroid; Lupus (systemic lupus erythematosus); Depressive disorder, not elsewhere classified; Pure hypercholesterolemia; Kidney stone; and Migraine. here to followup.   Her migraines are active again and has been using robaxin prn with fair results.   PLAN: - continue robaxin prn migraine - may need to establish with pain mgmt  Return in about 1 year (around 06/20/2015) for with C Hassell Done.   Penni Bombard, MD [id number], [id number] Certified in Neurology, Neurophysiology and Neuroimaging  [facility] [address], [place], [place] [address] [phone]

## 2014-06-19 NOTE — Patient Instructions (Signed)
Continue current medications. 

## 2014-07-11 ENCOUNTER — Ambulatory Visit
Admission: RE | Admit: 2014-07-11 | Discharge: 2014-07-11 | Disposition: A | Discharge status: Home / Self Care | Payer: BLUE CROSS/BLUE SHIELD | Source: Ambulatory Visit | Attending: Nurse Practitioner | Admitting: Nurse Practitioner

## 2014-07-11 DIAGNOSIS — R921 Mammographic calcification found on diagnostic imaging of breast: Secondary | ICD-10-CM

## 2014-07-14 ENCOUNTER — Telehealth: Payer: Self-pay

## 2014-07-14 NOTE — Telephone Encounter (Signed)
Rec'd from [facility] forward 12 pages to Historical Provider

## 2014-07-25 ENCOUNTER — Encounter: Payer: Self-pay | Admitting: Certified Nurse Midwife

## 2014-07-25 ENCOUNTER — Ambulatory Visit: Payer: BC Managed Care – PPO | Admitting: Certified Nurse Midwife

## 2015-01-30 ENCOUNTER — Encounter: Payer: Self-pay | Admitting: Certified Nurse Midwife

## 2015-05-11 ENCOUNTER — Encounter (HOSPITAL_COMMUNITY): Payer: Self-pay | Admitting: Emergency Medicine

## 2015-05-11 ENCOUNTER — Emergency Department (HOSPITAL_COMMUNITY)
Admission: EM | Admit: 2015-05-11 | Discharge: 2015-05-11 | Disposition: A | Discharge status: Home / Self Care | Payer: BLUE CROSS/BLUE SHIELD | Attending: Emergency Medicine | Admitting: Emergency Medicine

## 2015-05-11 ENCOUNTER — Emergency Department (HOSPITAL_COMMUNITY): Payer: BLUE CROSS/BLUE SHIELD

## 2015-05-11 DIAGNOSIS — Z87442 Personal history of urinary calculi: Secondary | ICD-10-CM | POA: Insufficient documentation

## 2015-05-11 DIAGNOSIS — E871 Hypo-osmolality and hyponatremia: Secondary | ICD-10-CM | POA: Insufficient documentation

## 2015-05-11 DIAGNOSIS — G43909 Migraine, unspecified, not intractable, without status migrainosus: Secondary | ICD-10-CM | POA: Insufficient documentation

## 2015-05-11 DIAGNOSIS — Z8673 Personal history of transient ischemic attack (TIA), and cerebral infarction without residual deficits: Secondary | ICD-10-CM | POA: Insufficient documentation

## 2015-05-11 DIAGNOSIS — D649 Anemia, unspecified: Secondary | ICD-10-CM | POA: Diagnosis not present

## 2015-05-11 DIAGNOSIS — Z720 Tobacco use: Secondary | ICD-10-CM | POA: Insufficient documentation

## 2015-05-11 DIAGNOSIS — E039 Hypothyroidism, unspecified: Secondary | ICD-10-CM | POA: Insufficient documentation

## 2015-05-11 DIAGNOSIS — M329 Systemic lupus erythematosus, unspecified: Secondary | ICD-10-CM | POA: Diagnosis not present

## 2015-05-11 DIAGNOSIS — R079 Chest pain, unspecified: Secondary | ICD-10-CM | POA: Diagnosis not present

## 2015-05-11 DIAGNOSIS — R05 Cough: Secondary | ICD-10-CM | POA: Diagnosis not present

## 2015-05-11 DIAGNOSIS — Z79899 Other long term (current) drug therapy: Secondary | ICD-10-CM | POA: Diagnosis not present

## 2015-05-11 DIAGNOSIS — I10 Essential (primary) hypertension: Secondary | ICD-10-CM | POA: Diagnosis present

## 2015-05-11 LAB — CBC
HCT: 32.5 % — ABNORMAL LOW (ref 36.0–46.0)
Hemoglobin: 11 g/dL — ABNORMAL LOW (ref 12.0–15.0)
MCH: 28.9 pg (ref 26.0–34.0)
MCHC: 33.8 g/dL (ref 30.0–36.0)
MCV: 85.5 fL (ref 78.0–100.0)
Platelets: 446 10*3/uL — ABNORMAL HIGH (ref 150–400)
RBC: 3.8 MIL/uL — ABNORMAL LOW (ref 3.87–5.11)
RDW: 16.8 % — ABNORMAL HIGH (ref 11.5–15.5)
WBC: 6.2 10*3/uL (ref 4.0–10.5)

## 2015-05-11 LAB — URINALYSIS, ROUTINE W REFLEX MICROSCOPIC
BILIRUBIN URINE: NEGATIVE
Glucose, UA: NEGATIVE mg/dL
HGB URINE DIPSTICK: NEGATIVE
Ketones, ur: NEGATIVE mg/dL
Leukocytes, UA: NEGATIVE
NITRITE: NEGATIVE
PROTEIN: 30 mg/dL — AB
Specific Gravity, Urine: 1.002 — ABNORMAL LOW (ref 1.005–1.030)
UROBILINOGEN UA: 0.2 mg/dL (ref 0.0–1.0)
pH: 6 (ref 5.0–8.0)

## 2015-05-11 LAB — URINE MICROSCOPIC-ADD ON

## 2015-05-11 LAB — I-STAT TROPONIN, ED: Troponin i, poc: 0 ng/mL (ref 0.00–0.08)

## 2015-05-11 LAB — BASIC METABOLIC PANEL
Anion gap: 9 (ref 5–15)
BUN: 9 mg/dL (ref 6–20)
CO2: 22 mmol/L (ref 22–32)
Calcium: 8.8 mg/dL — ABNORMAL LOW (ref 8.9–10.3)
Chloride: 99 mmol/L — ABNORMAL LOW (ref 101–111)
Creatinine, Ser: 1.01 mg/dL — ABNORMAL HIGH (ref 0.44–1.00)
GFR calc Af Amer: >60 mL/min (ref >60)
GFR calc non Af Amer: >60 mL/min (ref >60)
Glucose, Bld: 99 mg/dL (ref 65–99)
Potassium: 4.2 mmol/L (ref 3.5–5.1)
Sodium: 130 mmol/L — ABNORMAL LOW (ref 135–145)

## 2015-05-11 NOTE — ED Notes (Signed)
Notified RN,Anna, pt. Blood pressure elevated 202/104 and pt. States has headache.

## 2015-05-11 NOTE — ED Notes (Signed)
Per pt, states she saw PCP yesterday-states BP elevated, on prednisone

## 2015-05-11 NOTE — ED Notes (Signed)
Pt states she feels much better and she just wants to go home and get in her bed,

## 2015-05-11 NOTE — ED Notes (Signed)
No lab draw in triage, pt enroute to back room

## 2015-05-11 NOTE — ED Notes (Signed)
Nurse now starting IV

## 2015-05-11 NOTE — ED Provider Notes (Signed)
CSN: [id number]     Arrival date & time 05/11/15  1814 History   First MD Initiated Contact with Patient 05/11/15 1927     Chief Complaint  Patient presents with  . Hypertension     (Consider location/radiation/quality/duration/timing/severity/associated sxs/prior Treatment) HPI Comments: Patient seen at her PCP yesterday with hypertension. Lab work obtained in the office.  Patient contacted today and told she needed to come to the ED.  Patient states she is compliant with her anti-hypertensive, but has been under more stress recently.  Reporting intermittent chest pain, shortness of breath, visual disturbance.  Patient is a 54 y.o. female presenting with hypertension. The history is provided by the patient.  Hypertension This is a chronic problem. The current episode started in the past 7 days. Associated symptoms include chest pain, coughing, headaches and a visual change. Pertinent negatives include no nausea or numbness.    Past Medical History  Diagnosis Date  . Hypertension   . Hypothyroid     Dr. Wilson Singer  . Lupus (systemic lupus erythematosus) ([place])   . Depressive disorder, not elsewhere classified   . Pure hypercholesterolemia   . Kidney stone   . Migraine   . Stroke [facility])    Past Surgical History  Procedure Laterality Date  . Cesarean section    . Tonsillectomy    . Lumbar disc surgery  2003  . Ectopic pregnancy surgery      times 2  . Total abdominal hysterectomy  2004    ovaries retained, DUB  . Laparoscopy      with rt salpingectomy  . Tubal ligation  1985   Family History  Problem Relation Age of Onset  . Autoimmune disease Neg Hx   . Diabetes Mother   . Hypertension Sister   . Diabetes Maternal Grandmother    Social History  Substance Use Topics  . Smoking status: Current Every Day Smoker -- 0.50 packs/day for 35 years    Types: Cigarettes  . Smokeless tobacco: Never Used  . Alcohol Use: Yes     Comment: 1 drink every 3-4 months.   OB History    Gravida Para Term Preterm AB TAB SAB Ectopic Multiple Living   5 1 1  4 2  2  1      Review of Systems  Respiratory: Positive for cough.   Cardiovascular: Positive for chest pain.  Gastrointestinal: Negative for nausea.  Neurological: Positive for headaches. Negative for numbness.  All other systems reviewed and are negative.     Allergies  Review of patient's allergies indicates no known allergies.  Home Medications   Prior to Admission medications   Medication Sig Start Date End Date Taking? Authorizing Provider  amLODipine (NORVASC) 5 MG tablet TAKE 1 TABLET (5 MG TOTAL) BY MOUTH DAILY. 10/11/13   Rita Ohara, MD  Aspirin-Acetaminophen-Caffeine (GOODYS EXTRA STRENGTH PO) Take by mouth.    Historical Provider, MD  Aspirin-Caffeine (BAYER BACK & BODY PAIN EX ST PO) Take 2 tablets by mouth 2 (two) times daily.    Historical Provider, MD  carbamazepine (TEGRETOL) 200 MG tablet Take 200 mg by mouth 3 (three) times daily.      Historical Provider, MD  diazepam (VALIUM) 10 MG tablet Take 10 mg by mouth every 8 (eight) hours as needed for anxiety or sleep.     Historical Provider, MD  escitalopram (LEXAPRO) 20 MG tablet Take 20 mg by mouth daily.    Historical Provider, MD  hydroxychloroquine (PLAQUENIL) 200 MG tablet Take 200 mg  by mouth 2 (two) times daily.      Historical Provider, MD  levothyroxine (SYNTHROID) 75 MCG tablet Take 75 mcg by mouth daily.     Historical Provider, MD  losartan-hydrochlorothiazide (HYZAAR) 100-12.5 MG per tablet TAKE 1 TABLET BY MOUTH DAILY. Patient not taking: Reported on 06/19/2014 10/11/13   Rita Ohara, MD  methocarbamol (ROBAXIN) 500 MG tablet One tablet up to three times daily as needed 09/28/13   Dennie Bible, NP  predniSONE (DELTASONE) 5 MG tablet Take 10 mg by mouth daily as needed (for flares).     Historical Provider, MD  QUEtiapine (SEROQUEL) 300 MG tablet Take 300-600 mg by mouth at bedtime.    Historical Provider, MD  ranitidine (ZANTAC) 75 MG  tablet Take 75 mg by mouth 2 (two) times daily.    Historical Provider, MD  traMADol (ULTRAM) 50 MG tablet Take 50 mg by mouth 2 (two) times daily. 02/14/13   Historical Provider, MD   BP 189/121 mmHg  Pulse 88  Temp(Src) 98.2 F (36.8 C) (Oral)  Resp 16  SpO2 98%  LMP 07/07/2002 Physical Exam  Constitutional: She is oriented to person, place, and time. She appears well-developed and well-nourished.  HENT:  Head: Normocephalic.  Eyes: Right eye exhibits abnormal extraocular motion. Left eye exhibits abnormal extraocular motion.  Neck: Neck supple.  Cardiovascular: Normal rate and regular rhythm.   Pulmonary/Chest: Effort normal.  Abdominal: Soft.  Musculoskeletal: She exhibits no edema or tenderness.  Neurological: She is alert and oriented to person, place, and time. GCS eye subscore is 4. GCS verbal subscore is 5. GCS motor subscore is 6.  Upper extremity strength normal, no arm drift, no facial ptosis, symmetric smile.  Mild decrease in LE strength, R<L.     ED Course  Procedures (including critical care time) Labs Review Labs Reviewed  BASIC METABOLIC PANEL - Abnormal; Notable for the following:    Sodium 130 (*)    Chloride 99 (*)    Creatinine, Ser 1.01 (*)    Calcium 8.8 (*)    All other components within normal limits  CBC - Abnormal; Notable for the following:    RBC 3.80 (*)    Hemoglobin 11.0 (*)    HCT 32.5 (*)    RDW 16.8 (*)    Platelets 446 (*)    All other components within normal limits  URINALYSIS, ROUTINE W REFLEX MICROSCOPIC (NOT AT [facility]) - Abnormal; Notable for the following:    APPearance CLOUDY (*)    Specific Gravity, Urine 1.002 (*)    Protein, ur 30 (*)    All other components within normal limits  URINE MICROSCOPIC-ADD ON - Abnormal; Notable for the following:    Squamous Epithelial / LPF FEW (*)    All other components within normal limits  I-STAT TROPOININ, ED    Imaging Review Dg Chest 2 View  05/11/2015  CLINICAL DATA:  Hypertension.  Patient states she was told to come to the emergency room because of abnormal labs. EXAM: CHEST  2 VIEW COMPARISON:  12/12/2010 FINDINGS: There is unchanged borderline cardiomegaly. The lungs are clear. There are no pleural effusions. Hilar and mediastinal contours are unremarkable and unchanged IMPRESSION: Stable borderline cardiomegaly.  No acute cardiopulmonary findings. Electronically Signed   By: Andreas Newport M.D.   On: 05/11/2015 21:34   I have personally reviewed and evaluated these images and lab results as part of my medical decision-making.   EKG Interpretation   Date/Time:  Friday May 11 2015  20:09:59 EDT Ventricular Rate:  77 PR Interval:  147 QRS Duration: 85 QT Interval:  406 QTC Calculation: 459 R Axis:   51 Text Interpretation:  Sinus rhythm No significant change since last  tracing Confirmed by Wilson Singer  MD, STEPHEN [id number]) on 05/11/2015 8:18:16 PM     Patient discussed with Dr. Wilson Singer. Lab and radiology results reviewed and shared with patient. Mild anemia and hyponatremia. No acute findings on radiology study. ECG without ischemia, negative troponin. No indication of hypertensive urgency or emergency. Discharged home with close follow-up with PCP.   MDM   Final diagnoses:  None    Hypertension.    Etta Quill, NP 05/12/15 [id number]  Virgel Manifold, MD 05/12/15 2316

## 2015-05-11 NOTE — ED Notes (Signed)
Delay in lab draw, pt in bathroom 

## 2015-05-11 NOTE — ED Notes (Signed)
EKG given to EDP,Knot,MD., for review.

## 2015-05-11 NOTE — Discharge Instructions (Signed)
Hypertension Hypertension, commonly called high blood pressure, is when the force of blood pumping through your arteries is too strong. Your arteries are the blood vessels that carry blood from your heart throughout your body. A blood pressure reading consists of a higher number over a lower number, such as 110/72. The higher number (systolic) is the pressure inside your arteries when your heart pumps. The lower number (diastolic) is the pressure inside your arteries when your heart relaxes. Ideally you want your blood pressure below 120/80. Hypertension forces your heart to work harder to pump blood. Your arteries may become narrow or stiff. Having untreated or uncontrolled hypertension can cause heart attack, stroke, kidney disease, and other problems. RISK FACTORS Some risk factors for high blood pressure are controllable. Others are not.  Risk factors you cannot control include:   Race. You may be at higher risk if you are African American.  Age. Risk increases with age.  Gender. Men are at higher risk than women before age 45 years. After age 65, women are at higher risk than men. Risk factors you can control include:  Not getting enough exercise or physical activity.  Being overweight.  Getting too much fat, sugar, calories, or salt in your diet.  Drinking too much alcohol. SIGNS AND SYMPTOMS Hypertension does not usually cause signs or symptoms. Extremely high blood pressure (hypertensive crisis) may cause headache, anxiety, shortness of breath, and nosebleed. DIAGNOSIS To check if you have hypertension, your health care provider will measure your blood pressure while you are seated, with your arm held at the level of your heart. It should be measured at least twice using the same arm. Certain conditions can cause a difference in blood pressure between your right and left arms. A blood pressure reading that is higher than normal on one occasion does not mean that you need treatment. If  it is not clear whether you have high blood pressure, you may be asked to return on a different day to have your blood pressure checked again. Or, you may be asked to monitor your blood pressure at home for 1 or more weeks. TREATMENT Treating high blood pressure includes making lifestyle changes and possibly taking medicine. Living a healthy lifestyle can help lower high blood pressure. You may need to change some of your habits. Lifestyle changes may include:  Following the DASH diet. This diet is high in fruits, vegetables, and whole grains. It is low in salt, red meat, and added sugars.  Keep your sodium intake below 2,300 mg per day.  Getting at least 30-45 minutes of aerobic exercise at least 4 times per week.  Losing weight if necessary.  Not smoking.  Limiting alcoholic beverages.  Learning ways to reduce stress. Your health care provider may prescribe medicine if lifestyle changes are not enough to get your blood pressure under control, and if one of the following is true:  You are 18-59 years of age and your systolic blood pressure is above 140.  You are 60 years of age or older, and your systolic blood pressure is above 150.  Your diastolic blood pressure is above 90.  You have diabetes, and your systolic blood pressure is over 140 or your diastolic blood pressure is over 90.  You have kidney disease and your blood pressure is above 140/90.  You have heart disease and your blood pressure is above 140/90. Your personal target blood pressure may vary depending on your medical conditions, your age, and other factors. HOME CARE INSTRUCTIONS    Have your blood pressure rechecked as directed by your health care provider.   Take medicines only as directed by your health care provider. Follow the directions carefully. Blood pressure medicines must be taken as prescribed. The medicine does not work as well when you skip doses. Skipping doses also puts you at risk for  problems.  Do not smoke.   Monitor your blood pressure at home as directed by your health care provider. SEEK MEDICAL CARE IF:   You think you are having a reaction to medicines taken.  You have recurrent headaches or feel dizzy.  You have swelling in your ankles.  You have trouble with your vision. SEEK IMMEDIATE MEDICAL CARE IF:  You develop a severe headache or confusion.  You have unusual weakness, numbness, or feel faint.  You have severe chest or abdominal pain.  You vomit repeatedly.  You have trouble breathing. MAKE SURE YOU:   Understand these instructions.  Will watch your condition.  Will get help right away if you are not doing well or get worse.   This information is not intended to replace advice given to you by your health care provider. Make sure you discuss any questions you have with your health care provider.   Document Released: 06/23/2005 Document Revised: 11/07/2014 Document Reviewed: 04/15/2013 Elsevier Interactive Patient Education 2016 Elsevier Inc.  

## 2015-05-11 NOTE — ED Notes (Signed)
Pt states that she went to her PCP yesterday and was told to come to ED today because of lab work and hypertension,  Pt is poor historian, so hard to get a timeline of events,  Pt has significant history of medical problems   States she take her medication daily as prescribed,  Admits to heavy smoking

## 2015-06-20 ENCOUNTER — Ambulatory Visit (INDEPENDENT_AMBULATORY_CARE_PROVIDER_SITE_OTHER): Payer: BLUE CROSS/BLUE SHIELD | Admitting: Nurse Practitioner

## 2015-06-20 ENCOUNTER — Encounter (INDEPENDENT_AMBULATORY_CARE_PROVIDER_SITE_OTHER): Payer: Self-pay

## 2015-06-20 ENCOUNTER — Encounter: Payer: Self-pay | Admitting: Nurse Practitioner

## 2015-06-20 VITALS — BP 148/96 | HR 93 | Ht 66.5 in | Wt 187.8 lb

## 2015-06-20 DIAGNOSIS — G43009 Migraine without aura, not intractable, without status migrainosus: Secondary | ICD-10-CM | POA: Diagnosis not present

## 2015-06-20 DIAGNOSIS — I10 Essential (primary) hypertension: Secondary | ICD-10-CM | POA: Diagnosis not present

## 2015-06-20 MED ORDER — GABAPENTIN 100 MG PO CAPS: 100.0000 mg | ORAL_CAPSULE | Freq: Three times a day (TID) | ORAL | Status: DC

## 2015-06-20 NOTE — Progress Notes (Signed)
[facility]  PATIENT: Anna [name] DOB: [birth year]   REASON FOR VISIT: Follow-up for migraines HISTORY FROM: Patient    HISTORY OF PRESENT ILLNESS: Ms. Schiavi, 54 year old female returns for follow-up. She was last seen in the office by Dr. Leta Baptist 06/21/2014. She has a long history of migraines and is currently taking gabapentin and Robaxin. She has a flare of her lupus at present and is on prednisone taper. Migraines are in fairly good control. She had admission to the ER last month for hypertension. Blood pressure in the office today 148/96. She claims "this is better than it has been." She continues to have diffuse arthritic pain particularly in the knees and ankles. 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 She continues to see psychiatry for her bipolar disorder. She returns for reevaluation   UPDATE 06/19/14: VPSince last visit, has had more migraines: 2-3 per week, throbbing, with nausea, photo/phonophobia. She uses robaxin and goody's back/body powder for relief. Still with diffuse arthritis pain. Feels like her lupus is flaring up. Sees Dr. Trudie Reed for lupus. Sees Dr. Lavone Neri and Dr. Tomi Bamberger for PCP.   UPDATE 06/17/13 (CM): History of migraines and has been on gabapentin and Robaxin in the past both that were beneficial however she is no longer taking this medications. Her headaches seem to be in fairly good control. Her biggest complaint is constipation for which she takes Colace. Her hypertension is under better control with the change in her blood pressure medicines. She also has history of lupus, bipolar disorder and hyperlipidemia. She is stable from a neurologic standpoint.  UPDATE 12/09/12 (CM): Recent admission to the ER for dizziness and lightheadedness. Blood pressure noted to be extremely elevated. Her Benicar was increased by Dr. Tomi Bamberger. No recent headaches. No change in convergence spasm, she says it is worse when  she is stressed. Has not followed up with Dr. Katy Fitch she was given bifocal glasses but she does not wear them. She is trying to get disability. She has Lupus. She recently was diagnosed with asthma. She sees Dr. Candis Schatz for bipolar disorder.She has been referred to vestibular rehab. She is driving.   PRIOR HPI (Dr. Erling Cruz): Long history of headaches, dizziness, and visual disturbance. MRI study of the brain 12/07/2003 and intracranial MRA 12/11/2003 were normal. She had a history of dizziness that sounded as if it was true vertigo. She had double vision on red lens testing and was seen by Dr. Jolyn Nap at [facility] and Dr. Philis Kendall in [employer] with evidence of convergent spasm on examination. TSH was low at 0.012, sedimentation rate was 4, CPK 88, serum for acetylcholine receptor antibodies was normal, and hemoglobin A1c was 5.6. Dr Erling Cruz saw her again 04/29/2007 for double vision. She was seen by Dr. Knute Neu and diagnosed with systemic lupus erythematosus in 2008. 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 She was seen again 08/12/2007 given a trial of clonazepam for convergence spasm. Her double vision is side-by-side and intermittent. She complains of intermittent lightheaded or dizziness worse with standing up or when walking lasting seconds to a few minutes. She has noted headaches that are vague in location. She states she has a lot of stress in her life. Her MRI of the brain in December 2010 was normal.      REVIEW OF SYSTEMS: Full 14 system review of systems performed and notable only for those listed, all  others are neg:  Constitutional: neg  Cardiovascular: neg Ear/Nose/Throat: neg  Skin: neg Eyes: neg Respiratory: neg Gastroitestinal: neg  Hematology/Lymphatic: neg  Endocrine: neg Musculoskeletal: Joint pain in the knees and ankles Allergy/Immunology: neg Neurological: Occasional migraine Psychiatric:  neg Sleep : neg   ALLERGIES: No Known Allergies  HOME MEDICATIONS: Outpatient Prescriptions Prior to Visit  Medication Sig Dispense Refill  . amLODipine (NORVASC) 5 MG tablet TAKE 1 TABLET (5 MG TOTAL) BY MOUTH DAILY. (Patient taking differently: 10mg  po daily) 30 tablet 5  . Aspirin-Acetaminophen (GOODY BODY PAIN) 500-325 MG PACK Take 1 Package by mouth daily as needed (pain).    . carbamazepine (TEGRETOL) 200 MG tablet Take 200-400 mg by mouth 2 (two) times daily. 200 mg in the morning 400 mg at night    . diazepam (VALIUM) 10 MG tablet Take 10 mg by mouth every 8 (eight) hours as needed for anxiety or sleep.     . diphenhydrAMINE (BENADRYL) 25 MG tablet Take 25 mg by mouth every 6 (six) hours as needed for allergies.    [name] escitalopram (LEXAPRO) 10 MG tablet Take 30 mg by mouth daily.    [name] gabapentin (NEURONTIN) 100 MG capsule Take 100 mg by mouth 3 (three) times daily.    . hydroxychloroquine (PLAQUENIL) 200 MG tablet Take 200 mg by mouth 2 (two) times daily.      [name] levothyroxine (SYNTHROID, LEVOTHROID) 50 MCG tablet Take 50 mcg by mouth daily before breakfast.    . losartan-hydrochlorothiazide (HYZAAR) 100-12.5 MG per tablet TAKE 1 TABLET BY MOUTH DAILY. 30 tablet 5  . methocarbamol (ROBAXIN) 500 MG tablet One tablet up to three times daily as needed (Patient taking differently: Take 500 mg by mouth every 8 (eight) hours as needed (headache). ) 90 tablet 6  . metoprolol succinate (TOPROL-XL) 50 MG 24 hr tablet Take 50 mg by mouth daily. Take with or immediately following a meal.    . predniSONE (DELTASONE) 5 MG tablet Take 5 mg by mouth daily as needed (for flares).     . QUEtiapine (SEROQUEL) 300 MG tablet Take 300 mg by mouth at bedtime.     . ranitidine (ZANTAC) 75 MG tablet Take 75 mg by mouth 2 (two) times daily as needed for heartburn.      No facility-administered medications prior to visit.    PAST MEDICAL HISTORY: Past Medical History  Diagnosis Date  . Hypertension   .  Hypothyroid     Dr. Wilson Singer  . Lupus (systemic lupus erythematosus) ([place])   . Depressive disorder, not elsewhere classified   . Pure hypercholesterolemia   . Kidney stone   . Migraine   . Stroke [facility])     PAST SURGICAL HISTORY: Past Surgical History  Procedure Laterality Date  . Cesarean section    . Tonsillectomy    . Lumbar disc surgery  2003  . Ectopic pregnancy surgery      times 2  . Total abdominal hysterectomy  2004    ovaries retained, DUB  . Laparoscopy      with rt salpingectomy  . Tubal ligation  1985    FAMILY HISTORY: Family History  Problem Relation Age of Onset  . Autoimmune disease Neg Hx   . Diabetes Mother   . Hypertension Sister   . Diabetes Maternal Grandmother     SOCIAL HISTORY: Social History   Social History  . Marital Status: Married    Spouse Name: Ludwig Clarks  . Number of Children: 1  . Years  of Education: 15   Occupational History  .     Social History Main Topics  . Smoking status: Current Every Day Smoker -- 0.50 packs/day for 35 years    Types: Cigarettes  . Smokeless tobacco: Never Used  . Alcohol Use: Yes     Comment: 1 drink every 3-4 months.  . Drug Use: No  . Sexual Activity:    Partners: Male    Birth Control/ Protection: Surgical     Comment: TAH   Other Topics Concern  . 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   Patient has one son, lives in [place].   Patient is disabled.   Patient has a college education.   Patient is right-handed.   Patient drinks some caffeine.     PHYSICAL EXAM  Filed Vitals:   06/20/15 0859  BP: 148/96  Pulse: 93  Height: 5' 6.5" (1.689 m)  Weight: 187 lb 12.8 oz (85.186 kg)   Body mass index is 29.86 kg/(m^2). Generalized: Well developed, in no acute distress  Head: normocephalic and atraumatic,. Oropharynx benign  Neck: Supple, no carotid bruits  Cardiac: Regular rate rhythm, no murmur  Neurological examination     Mentation: Alert oriented to time, place, history taking. Follows all commands speech and language fluent  Cranial nerve II-XII: Pupils were equal round reactive to light extraocular movements with intermittent convergence spasm and subjective double vision no nystagmus  visual field were full on confrontational test. Facial sensation and strength were normal. hearing was intact to finger rubbing bilaterally. Uvula tongue midline. head turning and shoulder shrug were normal and symmetric.Tongue protrusion into cheek strength was normal.  Motor: normal bulk and tone, full strength in the BUE, BLE, 4/5 due to knee and ankle pain  Coordination: finger-nose-finger, heel-to-shin bilaterally, no dysmetria, movements are slow Reflexes: 1+ upper lower and symmetric Gait and Station: Antalgic slow steady narrow based gait ambulates with a single-point cane  DIAGNOSTIC DATA (LABS, IMAGING, TESTING) - I reviewed patient records, labs, notes, testing and imaging myself where available.  Lab Results  Component Value Date   WBC 6.2 05/11/2015   HGB 11.0* 05/11/2015   HCT 32.5* 05/11/2015   MCV 85.5 05/11/2015   PLT 446* 05/11/2015      Component Value Date/Time   NA 130* 05/11/2015 2012   K 4.2 05/11/2015 2012   CL 99* 05/11/2015 2012   CO2 22 05/11/2015 2012   GLUCOSE 99 05/11/2015 2012   BUN 9 05/11/2015 2012   CREATININE 1.01* 05/11/2015 2012   CREATININE 1.17* 01/10/2013 1234   CALCIUM 8.8* 05/11/2015 2012   PROT 6.9 01/10/2013 1234   ALBUMIN 4.1 01/10/2013 1234   AST 14 01/10/2013 1234   ALT <8 01/10/2013 1234   ALKPHOS 83 01/10/2013 1234   BILITOT 0.2* 01/10/2013 1234   GFRNONAA >60 05/11/2015 2012   GFRAA >60 05/11/2015 2012       ASSESSMENT AND PLAN  54 y.o. year old female  has a past medical history of Hypertension;  Lupus (systemic lupus erythematosus) ([place]); Depressive disorder, not elsewhere classified;  Migraine; and bipolar disorder here to  follow-up.  PLAN:Continue Robaxin when necessary headache Continue Neurontin  Will refill Blood pressure 148/96, make sure you're taking your blood pressure medications and follow up with primary care provider Follow-up yearly and when necessary Dennie Bible, [facility], [facility], APRN  [facility] [address] [place], [place]  [address] [phone]  

## 2015-06-20 NOTE — Patient Instructions (Addendum)
Continue Robaxin when necessary headache Continue Neurontin is working refill Blood pressure 148/96, make sure you're taking your blood pressure medications and follow up with primary care provider Follow-up yearly and when necessary

## 2015-06-26 NOTE — Progress Notes (Signed)
I reviewed note and agree with plan.   Penni Bombard, MD [id number], [id number] PM Certified in Neurology, Neurophysiology and Neuroimaging  [facility] [address] [place], [place] [address] [phone]

## 2015-08-29 ENCOUNTER — Ambulatory Visit: Payer: BLUE CROSS/BLUE SHIELD | Attending: Rheumatology

## 2015-08-29 DIAGNOSIS — M545 Low back pain, unspecified: Secondary | ICD-10-CM

## 2015-08-29 DIAGNOSIS — R6889 Other general symptoms and signs: Secondary | ICD-10-CM | POA: Diagnosis present

## 2015-08-29 NOTE — Therapy (Signed)
[place], [place], [address] Phone: [phone]   Fax:  [phone]  Physical Therapy Evaluation  Patient Details  Name: Anna [name] MRN: [id number] Date of Birth: 21-Nov-1960 Referring Provider: Maryruth Eve , MD  Encounter Date: 08/29/2015      PT End of Session - 08/29/15 1621    Visit Number 1   Number of Visits 1   Authorization Type BC[employer]   PT Start Time 0130   PT Stop Time 0345   PT Time Calculation (min) 135 min   Activity Tolerance Patient limited by pain;Patient limited by fatigue   Behavior During Therapy --  Varied with emotional swings from crying  and  laughing.       Past Medical History  Diagnosis Date  . Hypertension   . Hypothyroid     Dr. Wilson Singer  . Lupus (systemic lupus erythematosus) ([place])   . Depressive disorder, not elsewhere classified   . Pure hypercholesterolemia   . Kidney stone   . Migraine   . Stroke [place])     Past Surgical History  Procedure Laterality Date  . Cesarean section    . Tonsillectomy    . Lumbar disc surgery  2003  . Ectopic pregnancy surgery      times 2  . Total abdominal hysterectomy  2004    ovaries retained, DUB  . Laparoscopy      with rt salpingectomy  . Tubal ligation  1985    There were no vitals filed for this visit.  Visit Diagnosis:  Bilateral low back pain without sciatica - Plan: PT plan of care cert/re-cert  Activity intolerance - Plan: PT plan of care cert/re-cert      Subjective Assessment - 08/29/15 1618    Subjective See FCE report scanned into EPIC            [facility] PT Assessment - 08/29/15 1619    Assessment   Medical Diagnosis Lupus   Referring Provider Maryruth Eve , MD   Precautions   Precautions None   Restrictions   Weight Bearing Restrictions No   Prior Function   Level of Independence Requires assistive device for independence;Needs assistance with ADLs;Needs assistance with homemaking   Cognition   Overall Cognitive Status Within Functional Limits for tasks assessed  She was emotional though during and after  FCE   Ambulation/Gait   Gait Comments Slow with SPC with decreased stride      See FCE report scanned into EPIC .  FCE report will be faxed to Dr Cleatis Polka.                                  Plan - 08/29/15 1636    Clinical Impression Statement Rannow was not able to complete the FCE testing due to high levels of pain.  Per protocol the testing was stopped as 10/10 level of pain stops testing . Her pain level did  not lower after a brief rest period.    PT Next Visit Plan If needed we can retest if information is not sufficient for disability judgement.    Consulted and Agree with Plan of Care Patient         Problem List Patient Active Problem List   Diagnosis Date Noted  . Migraine without aura 12/09/2012  . Unspecified constipation 12/08/2012  . Unspecified hypothyroidism 12/08/2012  . GERD (gastroesophageal reflux disease)  12/08/2012  . Multinodular goiter 08/20/2011  . Essential hypertension, benign 12/25/2010  . SLE (systemic lupus erythematosus) ([place]) 11/27/2010  . Depression 11/27/2010  . Hyperlipidemia 11/27/2010  . Hypothyroid 11/27/2010  . Encounter for long-term (current) use of other medications 11/27/2010    Darrel Hoover PT 08/29/2015, 4:38 PM  [facility] [address] [place], [place], [address] Phone: [phone]   Fax:  [phone]  Name: NIKYLA PRIEM MRN: [id number] Date of Birth: [birth year]

## 2016-01-22 ENCOUNTER — Ambulatory Visit: Payer: Self-pay | Admitting: Certified Nurse Midwife

## 2016-01-22 ENCOUNTER — Encounter: Payer: Self-pay | Admitting: Certified Nurse Midwife

## 2016-02-21 ENCOUNTER — Ambulatory Visit (INDEPENDENT_AMBULATORY_CARE_PROVIDER_SITE_OTHER): Payer: BLUE CROSS/BLUE SHIELD | Admitting: Nurse Practitioner

## 2016-02-21 ENCOUNTER — Encounter: Payer: Self-pay | Admitting: Nurse Practitioner

## 2016-02-21 VITALS — BP 150/96 | HR 80 | Ht 66.5 in | Wt 189.0 lb

## 2016-02-21 DIAGNOSIS — I10 Essential (primary) hypertension: Secondary | ICD-10-CM

## 2016-02-21 DIAGNOSIS — G43009 Migraine without aura, not intractable, without status migrainosus: Secondary | ICD-10-CM | POA: Diagnosis not present

## 2016-02-21 MED ORDER — METHOCARBAMOL 500 MG PO TABS: ORAL_TABLET | ORAL | 6 refills | Status: DC

## 2016-02-21 NOTE — Patient Instructions (Signed)
Continue Robaxin when necessary headache will refill Continue Neurontin  Blood pressure 150/96, make sure you're taking your blood pressure medications and follow up with primary care provider Follow-up yearly and when necessary

## 2016-02-21 NOTE — Progress Notes (Signed)
[facility]  PATIENT: Anna [name] DOB: 08/17/1960   REASON FOR VISIT: Follow-up for migraines HISTORY FROM: Patient    HISTORY OF PRESENT ILLNESS: Anna [name], 55 year old female returns for follow-up. She was last seen in the office by Dr. Leta [name] 06/21/2014. She has a long history of migraines and is currently taking gabapentin and Robaxin. She has lupus.Migraines are in fairly good control.  Blood pressure in the office today 150/96.  She has just had  changes to her blood pressure medicine. She claims "this is better than it has been." She continues to have diffuse arthritic pain particularly in the knees and ankles. 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 She continues to see psychiatry for her bipolar disorder. She returns for reevaluation   UPDATE 06/19/14: VPSince last visit, has had more migraines: 2-3 per week, throbbing, with nausea, photo/phonophobia. She uses robaxin and goody's back/body powder for relief. Still with diffuse arthritis pain. Feels like her lupus is flaring up. Sees Dr. Trudie [name] for lupus. Sees Dr. Lavone [name] and Dr. Tomi [name] for PCP.   UPDATE 06/17/13 (CM): History of migraines and has been on gabapentin and Robaxin in the past both that were beneficial however she is no longer taking this medications. Her headaches seem to be in fairly good control. Her biggest complaint is constipation for which she takes Colace. Her hypertension is under better control with the change in her blood pressure medicines. She also has history of lupus, bipolar disorder and hyperlipidemia. She is stable from a neurologic standpoint.  UPDATE 12/09/12 (CM): Recent admission to the ER for dizziness and lightheadedness. Blood pressure noted to be extremely elevated. Her Benicar was increased by Dr. Tomi [name]. No recent headaches. 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 Has not followed up with Dr. Katy [name]  she was given bifocal glasses but she does not wear them. She is trying to get disability. She has Lupus. She recently was diagnosed with asthma. She sees Dr. Candis [name] for bipolar disorder.She has been referred to vestibular rehab. She is driving.   PRIOR HPI (Dr. Erling [name]): Long history of headaches, dizziness, and visual disturbance. MRI study of the brain 12/07/2003 and intracranial MRA 12/11/2003 were normal. She had a history of dizziness that sounded as if it was true vertigo. She had double vision on red lens testing and was seen by Dr. Jolyn [name] at [facility] and Dr. Philis [name] in [place] with evidence of convergent spasm on examination. TSH was low at 0.012, sedimentation rate was 4, CPK 88, serum for acetylcholine receptor antibodies was normal, and hemoglobin A1c was 5.6. Dr Anna [name] saw her again 04/29/2007 for double vision. She was seen by Dr. Knute [name] and diagnosed with systemic lupus erythematosus in 2008. 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 She was seen again 08/12/2007 given a trial of clonazepam for convergence spasm. Her double vision is side-by-side and intermittent. She complains of intermittent lightheaded or dizziness worse with standing up or when walking lasting seconds to a few minutes. She has noted headaches that are vague in location. She states she has a lot of stress in her life. Her MRI of the brain in December 2010 was normal.      REVIEW OF SYSTEMS: Full 14 system review of systems performed and notable only for those listed, all others are neg:  Constitutional: neg  Cardiovascular: neg  Ear/Nose/Throat: neg  Skin: neg Eyes: blurred vision Respiratory: neg Gastroitestinal: neg  Hematology/Lymphatic: neg  Endocrine: neg Musculoskeletal: Joint pain in the knees and ankles Allergy/Immunology: neg Neurological: Occasional migraine Psychiatric: depression and anxiety Sleep :  neg   ALLERGIES: No Known Allergies  HOME MEDICATIONS: Outpatient Medications Prior to Visit  Medication Sig Dispense Refill  . Aspirin-Acetaminophen (GOODY BODY PAIN) 500-325 MG PACK Take 1 Package by mouth daily as needed (pain).    . carbamazepine (TEGRETOL) 200 MG tablet Take 200-400 mg by mouth 2 (two) times daily. 200 mg in the morning 400 mg at night    . diazepam (VALIUM) 10 MG tablet Take 10 mg by mouth every 8 (eight) hours as needed for anxiety or sleep.     . diphenhydrAMINE (BENADRYL) 25 MG tablet Take 25 mg by mouth every 6 (six) hours as needed for allergies.    [name] escitalopram (LEXAPRO) 10 MG tablet Take 30 mg by mouth daily.    [name] gabapentin (NEURONTIN) 100 MG capsule Take 1 capsule (100 mg total) by mouth 3 (three) times daily. 90 capsule 11  . hydroxychloroquine (PLAQUENIL) 200 MG tablet Take 200 mg by mouth 2 (two) times daily.      [name] levothyroxine (SYNTHROID, LEVOTHROID) 50 MCG tablet Take 50 mcg by mouth daily before breakfast.    . metoprolol succinate (TOPROL-XL) 50 MG 24 hr tablet Take 50 mg by mouth daily. Take with or immediately following a meal.    . QUEtiapine (SEROQUEL) 300 MG tablet Take 300 mg by mouth at bedtime.     . ranitidine (ZANTAC) 75 MG tablet Take 75 mg by mouth 2 (two) times daily as needed for heartburn.     . losartan-hydrochlorothiazide (HYZAAR) 100-12.5 MG per tablet TAKE 1 TABLET BY MOUTH DAILY. (Patient not taking: Reported on 02/21/2016) 30 tablet 5  . methocarbamol (ROBAXIN) 500 MG tablet One tablet up to three times daily as needed (Patient not taking: Reported on 02/21/2016) 90 tablet 6  . amLODipine (NORVASC) 5 MG tablet TAKE 1 TABLET (5 MG TOTAL) BY MOUTH DAILY. (Patient taking differently: 10mg  po daily) 30 tablet 5  . predniSONE (DELTASONE) 5 MG tablet Take 5 mg by mouth daily as needed (for flares).      No facility-administered medications prior to visit.     PAST MEDICAL HISTORY: Past Medical History:  Diagnosis Date  .  Depressive disorder, not elsewhere classified   . Hypertension   . Hypothyroid    Dr. Wilson [name]  . Kidney stone   . Lupus (systemic lupus erythematosus) ([place])   . Migraine   . Pure hypercholesterolemia   . Stroke [facility])     PAST SURGICAL HISTORY: Past Surgical History:  Procedure Laterality Date  . CESAREAN SECTION    . ECTOPIC PREGNANCY SURGERY     times 2  . LAPAROSCOPY     with rt salpingectomy  . [place] SURGERY  2003  . TONSILLECTOMY    . TOTAL ABDOMINAL HYSTERECTOMY  2004   ovaries retained, DUB  . TUBAL LIGATION  1985    FAMILY HISTORY: Family History  Problem Relation Age of Onset  . Diabetes Mother   . Hypertension Sister   . Diabetes Maternal Grandmother   . Autoimmune disease Neg Hx     SOCIAL HISTORY: Social History   Social History  . Marital status: Married    Spouse name: Ludwig Clarks  . Number of children: 1  . Years of education: 20   Occupational History  .  Unemployed   Social History Main Topics  . Smoking status: Current Every Day Smoker    Packs/day: 0.50    Years: 35.00    Types: Cigarettes  . Smokeless tobacco: Never Used  . Alcohol use Yes     Comment: 1 drink every 3-4 months.  . Drug use: No  . Sexual activity: Yes    Partners: Male    Birth control/ protection: Surgical     Comment: TAH   Other Topics Concern  . 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   Patient has one son, lives in [place].   Patient is disabled.   Patient has a college education.   Patient is right-handed.   Patient drinks some caffeine.     PHYSICAL EXAM  Vitals:   02/21/16 1422  BP: (!) 150/96  Pulse: 80  Weight: 189 lb (85.7 kg)  Height: 5' 6.5" (1.689 m)   Body mass index is 30.05 kg/m. Generalized: Well developed, in no acute distress  Head: normocephalic and atraumatic,. Oropharynx benign  Neck: Supple, no carotid bruits  Cardiac: Regular rate rhythm, no murmur   Neurological examination   Mentation: Alert oriented to time, place, history taking. Follows all commands speech and language fluent  Cranial nerve II-XII: Pupils were equal round reactive to light extraocular movements with intermittent convergence spasm and subjective double vision no nystagmus  visual field were full on confrontational test. Facial sensation and strength were normal. hearing was intact to finger rubbing bilaterally. Uvula tongue midline. head turning and shoulder shrug were normal and symmetric.Tongue protrusion into cheek strength was normal.  Motor: normal bulk and tone, full strength in the BUE, BLE,  Coordination: finger-nose-finger, heel-to-shin bilaterally, no dysmetria, movements are slow Reflexes: 1+ upper lower and symmetric Gait and Station: Antalgic slow steady narrow based gait  No assistive device  DIAGNOSTIC DATA (LABS, IMAGING, TESTING) - I reviewed patient records, labs, notes, testing and imaging myself where available.  Lab Results  Component Value Date   WBC 6.2 05/11/2015   HGB 11.0 (L) 05/11/2015   HCT 32.5 (L) 05/11/2015   MCV 85.5 05/11/2015   PLT 446 (H) 05/11/2015      Component Value Date/Time   NA 130 (L) 05/11/2015 2012   K 4.2 05/11/2015 2012   CL 99 (L) 05/11/2015 2012   CO2 22 05/11/2015 2012   GLUCOSE 99 05/11/2015 2012   BUN 9 05/11/2015 2012   CREATININE 1.01 (H) 05/11/2015 2012   CREATININE 1.17 (H) 01/10/2013 1234   CALCIUM 8.8 (L) 05/11/2015 2012   PROT 6.9 01/10/2013 1234   ALBUMIN 4.1 01/10/2013 1234   AST 14 01/10/2013 1234   ALT <8 01/10/2013 1234   ALKPHOS 83 01/10/2013 1234   BILITOT 0.2 (L) 01/10/2013 1234   GFRNONAA >60 05/11/2015 2012   GFRAA >60 05/11/2015 2012       ASSESSMENT AND PLAN  55 y.o. year old female  has a past medical history of Hypertension;  Lupus (systemic lupus erythematosus) ([place]); Depressive disorder, not elsewhere classified;  Migraine; and bipolar disorder here to  follow-up.  PLAN:Continue Robaxin when necessary for headache will refill Continue Neurontin  Will refill Blood pressure 150/96, make sure you're taking your blood pressure medications and follow up with primary care provider Follow-up yearly and when necessary Dennie Bible, [facility], [facility], [facility] [address] [place], [place] [address] [phone]

## 2016-06-18 ENCOUNTER — Ambulatory Visit: Payer: BLUE CROSS/BLUE SHIELD | Admitting: Nurse Practitioner

## 2016-07-03 ENCOUNTER — Encounter: Payer: Self-pay | Admitting: Obstetrics & Gynecology

## 2016-07-07 DIAGNOSIS — I509 Heart failure, unspecified: Secondary | ICD-10-CM

## 2016-07-07 HISTORY — DX: Heart failure, unspecified: I50.9

## 2016-08-04 ENCOUNTER — Inpatient Hospital Stay (HOSPITAL_COMMUNITY)
Admission: EM | Admit: 2016-08-04 | Discharge: 2016-08-16 | DRG: 291 | Disposition: A | Discharge status: Home Health | Payer: BLUE CROSS/BLUE SHIELD | Attending: Internal Medicine | Admitting: Internal Medicine

## 2016-08-04 ENCOUNTER — Encounter (HOSPITAL_COMMUNITY): Payer: Self-pay

## 2016-08-04 ENCOUNTER — Emergency Department (HOSPITAL_COMMUNITY): Payer: BLUE CROSS/BLUE SHIELD

## 2016-08-04 DIAGNOSIS — J9601 Acute respiratory failure with hypoxia: Secondary | ICD-10-CM | POA: Diagnosis present

## 2016-08-04 DIAGNOSIS — D649 Anemia, unspecified: Secondary | ICD-10-CM | POA: Diagnosis present

## 2016-08-04 DIAGNOSIS — Z6830 Body mass index (BMI) 30.0-30.9, adult: Secondary | ICD-10-CM | POA: Diagnosis not present

## 2016-08-04 DIAGNOSIS — E669 Obesity, unspecified: Secondary | ICD-10-CM | POA: Diagnosis present

## 2016-08-04 DIAGNOSIS — T50901A Poisoning by unspecified drugs, medicaments and biological substances, accidental (unintentional), initial encounter: Secondary | ICD-10-CM | POA: Diagnosis not present

## 2016-08-04 DIAGNOSIS — R410 Disorientation, unspecified: Secondary | ICD-10-CM

## 2016-08-04 DIAGNOSIS — N179 Acute kidney failure, unspecified: Secondary | ICD-10-CM | POA: Diagnosis present

## 2016-08-04 DIAGNOSIS — R0600 Dyspnea, unspecified: Secondary | ICD-10-CM | POA: Diagnosis present

## 2016-08-04 DIAGNOSIS — I5031 Acute diastolic (congestive) heart failure: Secondary | ICD-10-CM | POA: Diagnosis present

## 2016-08-04 DIAGNOSIS — G9340 Encephalopathy, unspecified: Secondary | ICD-10-CM | POA: Diagnosis present

## 2016-08-04 DIAGNOSIS — R4182 Altered mental status, unspecified: Secondary | ICD-10-CM | POA: Insufficient documentation

## 2016-08-04 DIAGNOSIS — I5033 Acute on chronic diastolic (congestive) heart failure: Secondary | ICD-10-CM | POA: Diagnosis present

## 2016-08-04 DIAGNOSIS — J90 Pleural effusion, not elsewhere classified: Secondary | ICD-10-CM | POA: Diagnosis present

## 2016-08-04 DIAGNOSIS — K219 Gastro-esophageal reflux disease without esophagitis: Secondary | ICD-10-CM | POA: Diagnosis present

## 2016-08-04 DIAGNOSIS — J189 Pneumonia, unspecified organism: Secondary | ICD-10-CM | POA: Diagnosis present

## 2016-08-04 DIAGNOSIS — IMO0002 Reserved for concepts with insufficient information to code with codable children: Secondary | ICD-10-CM

## 2016-08-04 DIAGNOSIS — R0603 Acute respiratory distress: Secondary | ICD-10-CM

## 2016-08-04 DIAGNOSIS — E039 Hypothyroidism, unspecified: Secondary | ICD-10-CM | POA: Diagnosis present

## 2016-08-04 DIAGNOSIS — M3219 Other organ or system involvement in systemic lupus erythematosus: Secondary | ICD-10-CM | POA: Diagnosis present

## 2016-08-04 DIAGNOSIS — M3213 Lung involvement in systemic lupus erythematosus: Secondary | ICD-10-CM | POA: Diagnosis not present

## 2016-08-04 DIAGNOSIS — Z79899 Other long term (current) drug therapy: Secondary | ICD-10-CM

## 2016-08-04 DIAGNOSIS — R451 Restlessness and agitation: Secondary | ICD-10-CM | POA: Diagnosis not present

## 2016-08-04 DIAGNOSIS — F329 Major depressive disorder, single episode, unspecified: Secondary | ICD-10-CM | POA: Diagnosis present

## 2016-08-04 DIAGNOSIS — Z8673 Personal history of transient ischemic attack (TIA), and cerebral infarction without residual deficits: Secondary | ICD-10-CM

## 2016-08-04 DIAGNOSIS — E8779 Other fluid overload: Secondary | ICD-10-CM

## 2016-08-04 DIAGNOSIS — I110 Hypertensive heart disease with heart failure: Principal | ICD-10-CM | POA: Diagnosis present

## 2016-08-04 DIAGNOSIS — R0602 Shortness of breath: Secondary | ICD-10-CM

## 2016-08-04 DIAGNOSIS — I509 Heart failure, unspecified: Secondary | ICD-10-CM | POA: Insufficient documentation

## 2016-08-04 DIAGNOSIS — R6510 Systemic inflammatory response syndrome (SIRS) of non-infectious origin without acute organ dysfunction: Secondary | ICD-10-CM | POA: Diagnosis present

## 2016-08-04 DIAGNOSIS — I10 Essential (primary) hypertension: Secondary | ICD-10-CM | POA: Diagnosis not present

## 2016-08-04 DIAGNOSIS — Z8739 Personal history of other diseases of the musculoskeletal system and connective tissue: Secondary | ICD-10-CM

## 2016-08-04 DIAGNOSIS — F17210 Nicotine dependence, cigarettes, uncomplicated: Secondary | ICD-10-CM | POA: Diagnosis present

## 2016-08-04 DIAGNOSIS — R0902 Hypoxemia: Secondary | ICD-10-CM

## 2016-08-04 DIAGNOSIS — F32A Depression, unspecified: Secondary | ICD-10-CM | POA: Diagnosis present

## 2016-08-04 DIAGNOSIS — G0490 Encephalitis and encephalomyelitis, unspecified: Secondary | ICD-10-CM

## 2016-08-04 DIAGNOSIS — Y95 Nosocomial condition: Secondary | ICD-10-CM | POA: Diagnosis present

## 2016-08-04 DIAGNOSIS — Z7952 Long term (current) use of systemic steroids: Secondary | ICD-10-CM

## 2016-08-04 DIAGNOSIS — J811 Chronic pulmonary edema: Secondary | ICD-10-CM

## 2016-08-04 DIAGNOSIS — R0682 Tachypnea, not elsewhere classified: Secondary | ICD-10-CM

## 2016-08-04 DIAGNOSIS — M329 Systemic lupus erythematosus, unspecified: Secondary | ICD-10-CM | POA: Diagnosis present

## 2016-08-04 DIAGNOSIS — E7800 Pure hypercholesterolemia, unspecified: Secondary | ICD-10-CM | POA: Diagnosis present

## 2016-08-04 DIAGNOSIS — D62 Acute posthemorrhagic anemia: Secondary | ICD-10-CM

## 2016-08-04 DIAGNOSIS — Z9889 Other specified postprocedural states: Secondary | ICD-10-CM

## 2016-08-04 HISTORY — DX: Family history of other specified conditions: Z84.89

## 2016-08-04 HISTORY — DX: Heart failure, unspecified: I50.9

## 2016-08-04 LAB — RAPID URINE DRUG SCREEN, HOSP PERFORMED
AMPHETAMINES: NOT DETECTED
BARBITURATES: NOT DETECTED
Benzodiazepines: POSITIVE — AB
COCAINE: NOT DETECTED
OPIATES: NOT DETECTED
TETRAHYDROCANNABINOL: NOT DETECTED

## 2016-08-04 LAB — COMPREHENSIVE METABOLIC PANEL
ALBUMIN: 2.5 g/dL — AB (ref 3.5–5.0)
ALK PHOS: 87 U/L (ref 38–126)
ALT: 11 U/L — ABNORMAL LOW (ref 14–54)
ANION GAP: 13 (ref 5–15)
AST: 18 U/L (ref 15–41)
BILIRUBIN TOTAL: 0.5 mg/dL (ref 0.3–1.2)
BUN: 20 mg/dL (ref 6–20)
CALCIUM: 7.9 mg/dL — AB (ref 8.9–10.3)
CO2: 15 mmol/L — ABNORMAL LOW (ref 22–32)
Chloride: 110 mmol/L (ref 101–111)
Creatinine, Ser: 1.58 mg/dL — ABNORMAL HIGH (ref 0.44–1.00)
GFR calc non Af Amer: 35 mL/min — ABNORMAL LOW (ref >60)
GFR, EST AFRICAN AMERICAN: 41 mL/min — AB (ref >60)
Glucose, Bld: 100 mg/dL — ABNORMAL HIGH (ref 65–99)
POTASSIUM: 3.6 mmol/L (ref 3.5–5.1)
Sodium: 138 mmol/L (ref 135–145)
TOTAL PROTEIN: 6.9 g/dL (ref 6.5–8.1)

## 2016-08-04 LAB — TSH: TSH: 0.056 u[IU]/mL — ABNORMAL LOW (ref 0.350–4.500)

## 2016-08-04 LAB — URINALYSIS, ROUTINE W REFLEX MICROSCOPIC
Bacteria, UA: NONE SEEN
Bilirubin Urine: NEGATIVE
GLUCOSE, UA: NEGATIVE mg/dL
Hgb urine dipstick: NEGATIVE
Ketones, ur: NEGATIVE mg/dL
Leukocytes, UA: NEGATIVE
NITRITE: NEGATIVE
PH: 5 (ref 5.0–8.0)
Protein, ur: 100 mg/dL — AB
SPECIFIC GRAVITY, URINE: 1.02 (ref 1.005–1.030)

## 2016-08-04 LAB — CBC WITH DIFFERENTIAL/PLATELET
BASOS ABS: 0 10*3/uL (ref 0.0–0.1)
BASOS PCT: 0 %
EOS ABS: 0 10*3/uL (ref 0.0–0.7)
Eosinophils Relative: 0 %
HEMATOCRIT: 21.3 % — AB (ref 36.0–46.0)
Hemoglobin: 7.7 g/dL — ABNORMAL LOW (ref 12.0–15.0)
Lymphocytes Relative: 10 %
Lymphs Abs: 1.3 10*3/uL (ref 0.7–4.0)
MCH: 32.1 pg (ref 26.0–34.0)
MCHC: 36.2 g/dL — AB (ref 30.0–36.0)
MCV: 88.8 fL (ref 78.0–100.0)
Monocytes Absolute: 1.2 10*3/uL — ABNORMAL HIGH (ref 0.1–1.0)
Monocytes Relative: 9 %
NEUTROS ABS: 10.5 10*3/uL — AB (ref 1.7–7.7)
NEUTROS PCT: 81 %
Platelets: 426 10*3/uL — ABNORMAL HIGH (ref 150–400)
RBC: 2.4 MIL/uL — AB (ref 3.87–5.11)
RDW: 18.1 % — ABNORMAL HIGH (ref 11.5–15.5)
WBC: 13 10*3/uL — AB (ref 4.0–10.5)

## 2016-08-04 LAB — RETICULOCYTES
RBC.: 2.39 MIL/uL — AB (ref 3.87–5.11)
RETIC CT PCT: 1.8 % (ref 0.4–3.1)
Retic Count, Absolute: 43 10*3/uL (ref 19.0–186.0)

## 2016-08-04 LAB — PROTIME-INR
INR: 1.47
PROTHROMBIN TIME: 18 s — AB (ref 11.4–15.2)

## 2016-08-04 LAB — CBG MONITORING, ED: Glucose-Capillary: 100 mg/dL — ABNORMAL HIGH (ref 65–99)

## 2016-08-04 LAB — I-STAT CG4 LACTIC ACID, ED: LACTIC ACID, VENOUS: 1.06 mmol/L (ref 0.5–1.9)

## 2016-08-04 MED ORDER — ACETAMINOPHEN 325 MG PO TABS
650.0000 mg | ORAL_TABLET | ORAL | Status: DC | PRN
Start: 2016-08-04 — End: 2016-08-13
  Administered 2016-08-08: 650 mg via ORAL
  Filled 2016-08-04 (×2): qty 2

## 2016-08-04 MED ORDER — SODIUM CHLORIDE 0.9 % IV SOLN: 250.0000 mL | INTRAVENOUS | Status: DC | PRN

## 2016-08-04 MED ORDER — LEVOTHYROXINE SODIUM 75 MCG PO TABS
75.0000 ug | ORAL_TABLET | Freq: Every day | ORAL | Status: DC
  Administered 2016-08-05 – 2016-08-06 (×2): 75 ug via ORAL
  Filled 2016-08-04 (×3): qty 1

## 2016-08-04 MED ORDER — DEXTROSE 5 % IV SOLN
1.0000 g | INTRAVENOUS | Status: DC
  Administered 2016-08-06 (×2): 1 g via INTRAVENOUS
  Filled 2016-08-04 (×3): qty 10

## 2016-08-04 MED ORDER — AMLODIPINE BESYLATE 10 MG PO TABS
10.0000 mg | ORAL_TABLET | Freq: Every day | ORAL | Status: DC
  Administered 2016-08-05 – 2016-08-10 (×6): 10 mg via ORAL
  Filled 2016-08-04 (×6): qty 1

## 2016-08-04 MED ORDER — DIPHENHYDRAMINE HCL 25 MG PO CAPS
25.0000 mg | ORAL_CAPSULE | Freq: Four times a day (QID) | ORAL | Status: DC | PRN
  Filled 2016-08-04: qty 1

## 2016-08-04 MED ORDER — DEXTROSE 5 % IV SOLN
500.0000 mg | INTRAVENOUS | Status: DC
  Administered 2016-08-05: 500 mg via INTRAVENOUS
  Filled 2016-08-04: qty 500

## 2016-08-04 MED ORDER — GABAPENTIN 100 MG PO CAPS: 100.0000 mg | ORAL_CAPSULE | Freq: Three times a day (TID) | ORAL | Status: DC

## 2016-08-04 MED ORDER — FAMOTIDINE IN NACL 20-0.9 MG/50ML-% IV SOLN
20.0000 mg | Freq: Two times a day (BID) | INTRAVENOUS | Status: DC
  Administered 2016-08-05 (×3): 20 mg via INTRAVENOUS
  Filled 2016-08-04 (×5): qty 50

## 2016-08-04 MED ORDER — ATORVASTATIN CALCIUM 20 MG PO TABS
20.0000 mg | ORAL_TABLET | Freq: Every day | ORAL | Status: DC
  Administered 2016-08-05 – 2016-08-15 (×11): 20 mg via ORAL
  Filled 2016-08-04 (×11): qty 1

## 2016-08-04 MED ORDER — CARBAMAZEPINE 200 MG PO TABS
200.0000 mg | ORAL_TABLET | Freq: Every morning | ORAL | Status: DC
  Administered 2016-08-05 – 2016-08-16 (×12): 200 mg via ORAL
  Filled 2016-08-04 (×12): qty 1

## 2016-08-04 MED ORDER — ESCITALOPRAM OXALATE 20 MG PO TABS
30.0000 mg | ORAL_TABLET | Freq: Every day | ORAL | Status: DC
  Administered 2016-08-06 – 2016-08-16 (×11): 30 mg via ORAL
  Filled 2016-08-04: qty 3
  Filled 2016-08-04 (×7): qty 1
  Filled 2016-08-04 (×3): qty 3

## 2016-08-04 MED ORDER — FUROSEMIDE 10 MG/ML IJ SOLN
20.0000 mg | Freq: Once | INTRAMUSCULAR | Status: AC
  Administered 2016-08-04: 20 mg via INTRAVENOUS
  Filled 2016-08-04: qty 2

## 2016-08-04 MED ORDER — CEFTRIAXONE SODIUM 1 G IJ SOLR
1.0000 g | Freq: Once | INTRAMUSCULAR | Status: AC
  Administered 2016-08-04: 1 g via INTRAVENOUS
  Filled 2016-08-04: qty 10

## 2016-08-04 MED ORDER — SODIUM CHLORIDE 0.9% FLUSH
3.0000 mL | Freq: Two times a day (BID) | INTRAVENOUS | Status: DC
  Administered 2016-08-05 – 2016-08-16 (×24): 3 mL via INTRAVENOUS

## 2016-08-04 MED ORDER — SODIUM CHLORIDE 0.9% FLUSH: 3.0000 mL | INTRAVENOUS | Status: DC | PRN

## 2016-08-04 MED ORDER — CARBAMAZEPINE 200 MG PO TABS: 200.0000 mg | ORAL_TABLET | ORAL | Status: DC

## 2016-08-04 MED ORDER — METOPROLOL SUCCINATE ER 50 MG PO TB24
50.0000 mg | ORAL_TABLET | Freq: Every day | ORAL | Status: DC
  Administered 2016-08-05 – 2016-08-16 (×12): 50 mg via ORAL
  Filled 2016-08-04 (×13): qty 1

## 2016-08-04 MED ORDER — METHOCARBAMOL 500 MG PO TABS: 500.0000 mg | ORAL_TABLET | Freq: Two times a day (BID) | ORAL | Status: DC | PRN

## 2016-08-04 MED ORDER — AZITHROMYCIN 500 MG IV SOLR
500.0000 mg | Freq: Once | INTRAVENOUS | Status: AC
  Administered 2016-08-04: 500 mg via INTRAVENOUS
  Filled 2016-08-04: qty 500

## 2016-08-04 MED ORDER — CARBAMAZEPINE 200 MG PO TABS
400.0000 mg | ORAL_TABLET | Freq: Every day | ORAL | Status: DC
  Administered 2016-08-05 – 2016-08-15 (×10): 400 mg via ORAL
  Filled 2016-08-04 (×11): qty 2

## 2016-08-04 MED ORDER — DIAZEPAM 5 MG PO TABS: 5.0000 mg | ORAL_TABLET | Freq: Three times a day (TID) | ORAL | Status: DC | PRN

## 2016-08-04 MED ORDER — HYDROCODONE-ACETAMINOPHEN 5-325 MG PO TABS: 1.0000 | ORAL_TABLET | Freq: Four times a day (QID) | ORAL | Status: DC | PRN

## 2016-08-04 MED ORDER — FUROSEMIDE 10 MG/ML IJ SOLN
20.0000 mg | Freq: Two times a day (BID) | INTRAMUSCULAR | Status: DC
  Administered 2016-08-05: 20 mg via INTRAVENOUS
  Filled 2016-08-04: qty 2

## 2016-08-04 MED ORDER — ONDANSETRON HCL 4 MG/2ML IJ SOLN: 4.0000 mg | Freq: Four times a day (QID) | INTRAMUSCULAR | Status: DC | PRN

## 2016-08-04 MED ORDER — QUETIAPINE FUMARATE 300 MG PO TABS
300.0000 mg | ORAL_TABLET | Freq: Every day | ORAL | Status: DC
  Filled 2016-08-04: qty 1

## 2016-08-04 NOTE — ED Provider Notes (Signed)
[facility] DEPT Provider Note  CSN: [id number] Arrival date & time: 08/04/16  1617  History   Chief Complaint Chief Complaint  Patient presents with  . Altered Mental Status  . Shortness of Breath   HPI Anna [name] is a 56 y.o. female.  The history is provided by the patient and medical records. The history is limited by the condition of the patient. No language interpreter was used.  Illness  This is a new problem. The current episode started more than 1 week ago. The problem occurs constantly. The problem has been gradually worsening. Associated symptoms include shortness of breath. The symptoms are aggravated by coughing and exertion. Nothing relieves the symptoms.   Past Medical History:  Diagnosis Date  . Depressive disorder, not elsewhere classified   . Hypertension   . Hypothyroid    Dr. Wilson Singer  . Kidney stone   . Lupus (systemic lupus erythematosus) ([place])   . Migraine   . Pure hypercholesterolemia   . Stroke [facility])    Patient Active Problem List   Diagnosis Date Noted  . Altered mental status 08/04/2016  . AKI (acute kidney injury) ([place]) 08/04/2016  . Acute CHF (congestive heart failure) ([place]) 08/04/2016  . Anemia 08/04/2016  . Acute respiratory failure with hypoxia ([place]) 08/04/2016  . Acute encephalopathy 08/04/2016  . Migraine without aura 12/09/2012  . Unspecified constipation 12/08/2012  . Unspecified hypothyroidism 12/08/2012  . GERD (gastroesophageal reflux disease) 12/08/2012  . Multinodular goiter 08/20/2011  . Essential hypertension, benign 12/25/2010  . SLE (systemic lupus erythematosus) ([place]) 11/27/2010  . Depression 11/27/2010  . Hyperlipidemia 11/27/2010  . Hypothyroid 11/27/2010  . Encounter for long-term (current) use of other medications 11/27/2010   Past Surgical History:  Procedure Laterality Date  . CESAREAN SECTION    . ECTOPIC PREGNANCY SURGERY     times 2  . LAPAROSCOPY     with rt salpingectomy  . [place] SURGERY  2003   . TONSILLECTOMY    . TOTAL ABDOMINAL HYSTERECTOMY  2004   ovaries retained, DUB  . TUBAL LIGATION  1985   OB History    Gravida Para Term Preterm AB Living   5 1 1   4 1    SAB TAB Ectopic Multiple Live Births     2 2   1      Home Medications    Prior to Admission medications   Medication Sig Start Date End Date Taking? Authorizing Provider  amLODipine (NORVASC) 10 MG tablet Take 10 mg by mouth daily.   Yes Historical Provider, MD  atorvastatin (LIPITOR) 20 MG tablet Take 20 mg by mouth daily.   Yes Historical Provider, MD  carbamazepine (TEGRETOL) 200 MG tablet Take 200-400 mg by mouth See admin instructions. Take 1 tablet (200 mg) by mouth every morning and 2 tablets (400 mg) at night   Yes Historical Provider, MD  diazepam (VALIUM) 10 MG tablet Take 5-10 mg by mouth 3 (three) times daily as needed for anxiety.    Yes Historical Provider, MD  escitalopram (LEXAPRO) 10 MG tablet Take 30 mg by mouth daily.   Yes Historical Provider, MD  HYDROcodone-acetaminophen (NORCO/VICODIN) 5-325 MG tablet Take 1 tablet by mouth 2 (two) times daily. 06/18/16  Yes Historical Provider, MD  hydroxychloroquine (PLAQUENIL) 200 MG tablet Take 200 mg by mouth 2 (two) times daily. Take with food or milk   Yes Historical Provider, MD  levothyroxine (SYNTHROID, LEVOTHROID) 50 MCG tablet Take 50 mcg by mouth daily before breakfast.  Yes Historical Provider, MD  losartan (COZAAR) 100 MG tablet Take 100 mg by mouth daily.   Yes Historical Provider, MD  methocarbamol (ROBAXIN) 500 MG tablet One tablet up to two times daily as needed Patient taking differently: Take 500 mg by mouth 2 (two) times daily as needed for muscle spasms.  02/21/16  Yes Dennie Bible, NP  methotrexate (RHEUMATREX) 2.5 MG tablet Take 7.5 mg by mouth once a week. 08/01/16  Yes Historical Provider, MD  metoprolol succinate (TOPROL-XL) 50 MG 24 hr tablet Take 50 mg by mouth daily. Take with or immediately following a meal.   Yes  Historical Provider, MD  QUEtiapine (SEROQUEL) 300 MG tablet Take 300 mg by mouth at bedtime.    Yes Historical Provider, MD  Aspirin-Acetaminophen (GOODY BODY PAIN) 500-325 MG PACK Take 1 Package by mouth daily as needed (pain).    Historical Provider, MD  diphenhydrAMINE (BENADRYL) 25 MG tablet Take 25 mg by mouth every 6 (six) hours as needed for allergies.    Historical Provider, MD  furosemide (LASIX) 20 MG tablet Take 20 mg by mouth 2 (two) times daily. 01/31/16   Historical Provider, MD  gabapentin (NEURONTIN) 100 MG capsule Take 1 capsule (100 mg total) by mouth 3 (three) times daily. 06/20/15   Dennie Bible, NP  ranitidine (ZANTAC) 75 MG tablet Take 75 mg by mouth 2 (two) times daily as needed for heartburn.     Historical Provider, MD   Family History Family History  Problem Relation Age of Onset  . Diabetes Mother   . Hypertension Sister   . Diabetes Maternal Grandmother   . Autoimmune disease Neg Hx    Social History Social History  Substance Use Topics  . Smoking status: Current Every Day Smoker    Packs/day: 0.50    Years: 35.00    Types: Cigarettes  . Smokeless tobacco: Never Used  . Alcohol use Yes     Comment: 1 drink every 3-4 months.   Allergies   Patient has no known allergies.  Review of Systems Review of Systems  Unable to perform ROS: Mental status change  Respiratory: Positive for cough and shortness of breath.    Physical Exam Updated Vital Signs BP (!) 137/115   Pulse 100   Temp 99 F (37.2 C) (Oral)   Resp 24   LMP 07/07/2002   SpO2 92%   Physical Exam  Constitutional: No distress.  Overweight middle-aged 19 female  HENT:  Head: Normocephalic and atraumatic.  Eyes: EOM are normal. Pupils are equal, round, and reactive to light.  Neck: Normal range of motion. Neck supple.  Cardiovascular: Regular rhythm and normal heart sounds.  Tachycardia present.   Pulmonary/Chest: Effort normal. She has rales.  Mildly tachypneic w/  HR to low 20s, and originally hypoxic to high 80s with improvement on nasal cannula, decreased breath sounds w/ rales  Abdominal: Soft. Bowel sounds are normal. She exhibits no distension. There is no tenderness.  Musculoskeletal: Normal range of motion. She exhibits edema.  Neurological: She is alert.  Patient alert, difficult to assess orientation given significant dysarthria, following commands in all 4 extremities, strength 5/5 throughout, sensation grossly normal  Skin: Skin is warm and dry. Capillary refill takes less than 2 seconds. She is not diaphoretic.  Nursing note and vitals reviewed.  ED Treatments / Results  Labs (all labs ordered are listed, but only abnormal results are displayed) Labs Reviewed  COMPREHENSIVE METABOLIC PANEL - Abnormal; Notable for the following:  Result Value   CO2 15 (*)    Glucose, Bld 100 (*)    Creatinine, Ser 1.58 (*)    Calcium 7.9 (*)    Albumin 2.5 (*)    ALT 11 (*)    GFR calc non Af Amer 35 (*)    GFR calc Af Amer 41 (*)    All other components within normal limits  CBC WITH DIFFERENTIAL/PLATELET - Abnormal; Notable for the following:    WBC 13.0 (*)    RBC 2.40 (*)    Hemoglobin 7.7 (*)    HCT 21.3 (*)    MCHC 36.2 (*)    RDW 18.1 (*)    Platelets 426 (*)    Neutro Abs 10.5 (*)    Monocytes Absolute 1.2 (*)    All other components within normal limits  PROTIME-INR - Abnormal; Notable for the following:    Prothrombin Time 18.0 (*)    All other components within normal limits  URINALYSIS, ROUTINE W REFLEX MICROSCOPIC - Abnormal; Notable for the following:    APPearance HAZY (*)    Protein, ur 100 (*)    Squamous Epithelial / LPF 0-5 (*)    All other components within normal limits  TSH - Abnormal; Notable for the following:    TSH 0.056 (*)    All other components within normal limits  RAPID URINE DRUG SCREEN, HOSP PERFORMED - Abnormal; Notable for the following:    Benzodiazepines POSITIVE (*)    All other components  within normal limits  CBG MONITORING, ED - Abnormal; Notable for the following:    Glucose-Capillary 100 (*)    All other components within normal limits  CULTURE, BLOOD (ROUTINE X 2)  CULTURE, BLOOD (ROUTINE X 2)  URINE CULTURE  CULTURE, EXPECTORATED SPUTUM-ASSESSMENT  GRAM STAIN  INFLUENZA PANEL BY PCR (TYPE A & B)  CARBAMAZEPINE, FREE AND TOTAL  BASIC METABOLIC PANEL  HIV ANTIBODY (ROUTINE TESTING)  STREP PNEUMONIAE URINARY ANTIGEN  MAGNESIUM  TROPONIN I  TROPONIN I  TROPONIN I  BRAIN NATRIURETIC PEPTIDE  VITAMIN B12  FOLATE  IRON AND TIBC  FERRITIN  RETICULOCYTES  SEDIMENTATION RATE  CORTISOL  I-STAT CG4 LACTIC ACID, ED  TYPE AND SCREEN   EKG  EKG Interpretation None      Radiology Dg Chest 2 View  Result Date: 08/04/2016 CLINICAL DATA:  Altered mental status. EXAM: CHEST  2 VIEW COMPARISON:  05/11/2015. FINDINGS: Cardiomegaly with bilateral from interstitial prominence and bilateral pleural effusions consistent with congestive heart failure. Small bilateral pleural effusions cannot be excluded. No pneumothorax . Interposition of the colon under the right hemidiaphragm patch that noted. Abdominal series is suggested to exclude free intraperitoneal air. IMPRESSION: 1. Congestive heart failure bilateral from interstitial edema. 2. Interposition of the colon under the right hemidiaphragm. Abdominal series can be obtained for further evaluation to exclude free intraperitoneal air. Critical Value/emergent results were called by telephone at the time of interpretation on 08/04/2016 at 5:38 pm to Dr. Davonna Belling , who verbally acknowledged these results. Electronically Signed   By: Marcello Moores  Register   On: 08/04/2016 17:41   Ct Head Wo Contrast  Result Date: 08/04/2016 CLINICAL DATA:  Acute onset of slurred speech.  Initial encounter. EXAM: CT HEAD WITHOUT CONTRAST TECHNIQUE: Contiguous axial images were obtained from the base of the skull through the vertex without  intravenous contrast. COMPARISON:  CT of the head performed 12/01/2012 FINDINGS: Brain: No evidence of acute infarction, hemorrhage, hydrocephalus, extra-axial collection or mass lesion/mass effect. Mild  periventricular white matter change likely reflects small vessel ischemic microangiopathy. The posterior fossa, including the cerebellum, brainstem and fourth ventricle, is within normal limits. The third and lateral ventricles, and basal ganglia are unremarkable in appearance. The cerebral hemispheres are symmetric in appearance, with normal gray-white differentiation. No mass effect or midline shift is seen. Vascular: No hyperdense vessel or unexpected calcification. Skull: There is no evidence of fracture; visualized osseous structures are unremarkable in appearance. Sinuses/Orbits: The orbits are within normal limits. The paranasal sinuses and mastoid air cells are well-aerated. Other: No significant soft tissue abnormalities are seen. IMPRESSION: 1. No acute intracranial pathology seen on CT. 2. Mild small vessel ischemic microangiopathy. Electronically Signed   By: Garald Balding M.D.   On: 08/04/2016 17:25   Dg Abd 2 Views  Result Date: 08/04/2016 CLINICAL DATA:  Question pneumoperitoneum on recent chest radiography EXAM: ABDOMEN - 2 VIEW COMPARISON:  08/04/2016 FINDINGS: Supine and left lateral decubitus imaging is negative for pneumoperitoneum. There is moderate gaseous distention of the colon. IMPRESSION: No evidence of pneumoperitoneum. Electronically Signed   By: Andreas Newport M.D.   On: 08/04/2016 19:37   Procedures Procedures (including critical care time)  Medications Ordered in ED Medications  azithromycin (ZITHROMAX) 500 mg in dextrose 5 % 250 mL IVPB (500 mg Intravenous New Bag/Given 08/04/16 2118)  furosemide (LASIX) injection 20 mg (not administered)  amLODipine (NORVASC) tablet 10 mg (not administered)  atorvastatin (LIPITOR) tablet 20 mg (not administered)    HYDROcodone-acetaminophen (NORCO/VICODIN) 5-325 MG per tablet 1-2 tablet (not administered)  methocarbamol (ROBAXIN) tablet 500 mg (not administered)  gabapentin (NEURONTIN) capsule 100 mg (not administered)  diphenhydrAMINE (BENADRYL) tablet 25 mg (not administered)  escitalopram (LEXAPRO) tablet 30 mg (not administered)  levothyroxine (SYNTHROID, LEVOTHROID) tablet 75 mcg (not administered)  metoprolol succinate (TOPROL-XL) 24 hr tablet 50 mg (not administered)  QUEtiapine (SEROQUEL) tablet 300 mg (not administered)  famotidine (PEPCID) IVPB 20 mg premix (not administered)  diazepam (VALIUM) tablet 5 mg (not administered)  carbamazepine (TEGRETOL) tablet 200-400 mg (not administered)  sodium chloride flush (NS) 0.9 % injection 3 mL (not administered)  sodium chloride flush (NS) 0.9 % injection 3 mL (not administered)  0.9 %  sodium chloride infusion (not administered)  acetaminophen (TYLENOL) tablet 650 mg (not administered)  ondansetron (ZOFRAN) injection 4 mg (not administered)  furosemide (LASIX) injection 20 mg (not administered)  cefTRIAXone (ROCEPHIN) 1 g in dextrose 5 % 50 mL IVPB (not administered)  azithromycin (ZITHROMAX) 500 mg in dextrose 5 % 250 mL IVPB (not administered)  cefTRIAXone (ROCEPHIN) 1 g in dextrose 5 % 50 mL IVPB (0 g Intravenous Stopped 08/04/16 2116)   Initial Impression / Assessment and Plan / ED Course  I have reviewed the triage vital signs and the nursing notes.  56 y.o. female with above stated PMHx, HPI, and physical. PMHx of HTN, HLD, hypothyroidism, SLE (taking Prednisone & Plaquenil), & migraines (taking tegretol). Also taking Valium, benadryl, Neurontin, & Seroquel at home. Per husband, patient has been slowly declining over the past several months with worsening fatigue and generalized weakness. Patient has shortness of breath & cough at baseline with worsening her this past week and rapid decline today. EMS was and patient was tachycardic to 1teens  and hypoxic to mid 80s.  Patient placed on oxygen. Chest x-ray showing bilateral interstitial edema and loop of bowel in RUQ - acute abdominal series showing no pneumoperitoneum. CBC w/ leukocytosis and anemia w/ Hgb of 7 - no melena or BRBPR. Type and  screen added. Given patient's recent cough, increased short requirement, and leukocytosis - given IV abx for CAP coverage. Lasix IV given for fluid component. CT head showing no acute intracranial abnormality. Tegretol level & influenza swab added.  Laboratory and imaging results were personally reviewed by myself and used in the medical decision making of this patient's treatment and disposition.  Pt admitted to medicine for further evaluation and management of AMS in setting of possible CAP in immunocompromised state & hypervolemia. Pt understands and agrees with the plan and has no further questions or concerns.   Pt care discussed with and followed by my attending, Dr. Davonna Belling  Mayer Camel, MD Pager [phone]  Final Clinical Impressions(s) / ED Diagnoses   Final diagnoses:  SOB (shortness of breath)  Community acquired pneumonia  Altered mental status  History of lupus  Long-term use of Plaquenil  Long term systemic steroid user  Hypervolemia  AKI (acute kidney injury) ([place])  Acute anemia   New Prescriptions New Prescriptions   No medications on file     Mayer Camel, MD 08/04/16 [place], MD 08/04/16 [phone]

## 2016-08-04 NOTE — ED Triage Notes (Signed)
Patient here from home for AMS and associated shortness of breath.  Patient has oral temp of 99. Tachycardia and O2 at 85% on room air.  EMS gave albuterol (patient had tachycardia before). Patient has no history of asthma or COPD.

## 2016-08-04 NOTE — H&P (Addendum)
History and Physical    Anna [name] JAS:[id number] DOB: [birth year] DOA: 08/04/2016  PCP: [name], MD   Patient coming from: Home  Chief Complaint: Acute encephalopathy, SOB   HPI: Anna [name] is a 56 y.o. female with medical history significant for lupus, hypothyroidism, hypertension, depression, and chronic CHF who presents to the emergency department with 2 months of progressive malaise and generalized weakness with acute worsening in dyspnea and acute encephalopathy. Patient is accompanied by her husband who provides much of the history. She has reportedly been increasingly fatigued and lethargic for the past 2 months and has begun complaining of shortness of breath for the past couple days she has become increasingly altered over the past couple days and was very confused today, prompting her presentation. There has been no known head trauma, use of alcohol or illicit substances, and no fevers. Patient has been coughing, but it does not seem to be productive. No leg swelling has been noted. There has been no recent changes in the patient's medications. No vomiting or diarrhea per report of the patient's husband. Appetite has been poor and the patient has been sleeping more.  ED Course: Upon arrival to the ED, patient is found to be afebrile, saturating high 80s on room air, tachypneic, mildly tachycardic, and with stable blood pressure. EKG features sinus tachycardia with rate 103 and flattening of the T waves in lateral leads. Chest x-ray is notable for cardiomegaly with bilateral interstitial prominence and bilateral pleural effusions consistent with CHF. Noncontrast head CT is negative for acute intracranial abnormality. Chemistry panel reveals a creatinine 1.58, up from an apparent baseline of 0.9. CBC is notable for a leukocytosis to 13,000 and a normocytic anemia with hemoglobin 7.7, down from 11.0 in April 2017. Lactic acid is reassuring at 1.06, INR is elevated to 1.47, and  urinalysis is unremarkable. Blood and urine cultures were obtained and the patient was treated with empiric Rocephin and azithromycin for a suspected community-acquired pneumonia. She was also given 20 mg IV Lasix in the ED and flu swab was sent, but remains pending. Tachypnea and tachycardia have resolved in the ED and blood pressure remained stable. Patient will be admitted to the telemetry unit for ongoing evaluation and management of acute encephalopathy with hypoxia, acute anemia, and acute kidney injury.  Review of Systems:  Unable to obtain ROS secondary to patient's clinical condition with acute encephalopathy.  Past Medical History:  Diagnosis Date  . Depressive disorder, not elsewhere classified   . Hypertension   . Hypothyroid    Dr. Wilson Singer  . Kidney stone   . Lupus (systemic lupus erythematosus) ([place])   . Migraine   . Pure hypercholesterolemia   . Stroke [facility])     Past Surgical History:  Procedure Laterality Date  . CESAREAN SECTION    . ECTOPIC PREGNANCY SURGERY     times 2  . LAPAROSCOPY     with rt salpingectomy  . [place] SURGERY  2003  . TONSILLECTOMY    . TOTAL ABDOMINAL HYSTERECTOMY  2004   ovaries retained, DUB  . TUBAL LIGATION  1985     reports that she has been smoking Cigarettes.  She has a 17.50 pack-year smoking history. She has never used smokeless tobacco. She reports that she drinks alcohol. She reports that she does not use drugs.  No Known Allergies  Family History  Problem Relation Age of Onset  . Diabetes Mother   . Hypertension Sister   . Diabetes Maternal Grandmother   .  Autoimmune disease Neg Hx      Prior to Admission medications   Medication Sig Start Date End Date Taking? Authorizing Provider  amLODipine (NORVASC) 10 MG tablet Take 10 mg by mouth daily.   Yes Historical Provider, MD  atorvastatin (LIPITOR) 20 MG tablet Take 20 mg by mouth daily.   Yes Historical Provider, MD  carbamazepine (TEGRETOL) 200 MG tablet Take 200-400  mg by mouth See admin instructions. Take 1 tablet (200 mg) by mouth every morning and 2 tablets (400 mg) at night   Yes Historical Provider, MD  diazepam (VALIUM) 10 MG tablet Take 5-10 mg by mouth 3 (three) times daily as needed for anxiety.    Yes Historical Provider, MD  escitalopram (LEXAPRO) 10 MG tablet Take 30 mg by mouth daily.   Yes Historical Provider, MD  HYDROcodone-acetaminophen (NORCO/VICODIN) 5-325 MG tablet Take 1 tablet by mouth 2 (two) times daily. 06/18/16  Yes Historical Provider, MD  hydroxychloroquine (PLAQUENIL) 200 MG tablet Take 200 mg by mouth 2 (two) times daily. Take with food or milk   Yes Historical Provider, MD  levothyroxine (SYNTHROID, LEVOTHROID) 50 MCG tablet Take 50 mcg by mouth daily before breakfast.   Yes Historical Provider, MD  losartan (COZAAR) 100 MG tablet Take 100 mg by mouth daily.   Yes Historical Provider, MD  methocarbamol (ROBAXIN) 500 MG tablet One tablet up to two times daily as needed Patient taking differently: Take 500 mg by mouth 2 (two) times daily as needed for muscle spasms.  02/21/16  Yes Dennie Bible, NP  methotrexate (RHEUMATREX) 2.5 MG tablet Take 7.5 mg by mouth once a week. 08/01/16  Yes Historical Provider, MD  metoprolol succinate (TOPROL-XL) 50 MG 24 hr tablet Take 50 mg by mouth daily. Take with or immediately following a meal.   Yes Historical Provider, MD  QUEtiapine (SEROQUEL) 300 MG tablet Take 300 mg by mouth at bedtime.    Yes Historical Provider, MD  Aspirin-Acetaminophen (GOODY BODY PAIN) 500-325 MG PACK Take 1 Package by mouth daily as needed (pain).    Historical Provider, MD  diphenhydrAMINE (BENADRYL) 25 MG tablet Take 25 mg by mouth every 6 (six) hours as needed for allergies.    Historical Provider, MD  furosemide (LASIX) 20 MG tablet Take 20 mg by mouth 2 (two) times daily. 01/31/16   Historical Provider, MD  gabapentin (NEURONTIN) 100 MG capsule Take 1 capsule (100 mg total) by mouth 3 (three) times daily.  06/20/15   Dennie Bible, NP  ranitidine (ZANTAC) 75 MG tablet Take 75 mg by mouth 2 (two) times daily as needed for heartburn.     Historical Provider, MD    Physical Exam: Vitals:   08/04/16 1900 08/04/16 1915 08/04/16 2115 08/04/16 2200  BP: 127/66 117/64 129/72 (!) 137/115  Pulse:      Resp: (!) 28 24    Temp:      TempSrc:      SpO2:          Constitutional: No acute distress, appears uncomfortable, moaning unintelligibly  Eyes: PERTLA, lids and conjunctivae normal ENMT: Mucous membranes are moist. Posterior pharynx clear of any exudate or lesions.   Neck: normal, supple, no masses, no thyromegaly Respiratory: Crackles bilateral bases and mid-lung zones. Mild tachypnea. No accessory muscle use.  Cardiovascular: Rate ~100 and regular with soft systolic murmur at apex. 2+ pretibial edema bilaterally. JVP 9 cmH2O. Abdomen: No distension, no tenderness, no masses palpated. Bowel sounds normal.  Musculoskeletal: no clubbing /  cyanosis. No joint deformity upper and lower extremities. Normal muscle tone.  Skin: no significant rashes, lesions, ulcers. Warm, dry, well-perfused. Neurologic: No gross facial asymmetry, PERRL, EOMI. Moving all extremities equally. Patellar DTR's normal bilateral. Moaning unintelligibly, not following commands.  Psychiatric: Difficult to assess given the clinical situation.     Labs on Admission: I have personally reviewed following labs and imaging studies  CBC:  Recent Labs Lab 08/04/16 1623  WBC 13.0*  NEUTROABS 10.5*  HGB 7.7*  HCT 21.3*  MCV 88.8  PLT 836*   Basic Metabolic Panel:  Recent Labs Lab 08/04/16 1623  NA 138  K 3.6  CL 110  CO2 15*  GLUCOSE 100*  BUN 20  CREATININE 1.58*  CALCIUM 7.9*   GFR: CrCl cannot be calculated (Unknown ideal weight.). Liver Function Tests:  Recent Labs Lab 08/04/16 1623  AST 18  ALT 11*  ALKPHOS 87  BILITOT 0.5  PROT 6.9  ALBUMIN 2.5*   No results for input(s): LIPASE,  AMYLASE in the last 168 hours. No results for input(s): AMMONIA in the last 168 hours. Coagulation Profile:  Recent Labs Lab 08/04/16 1623  INR 1.47   Cardiac Enzymes: No results for input(s): CKTOTAL, CKMB, CKMBINDEX, TROPONINI in the last 168 hours. BNP (last 3 results) No results for input(s): PROBNP in the last 8760 hours. HbA1C: No results for input(s): HGBA1C in the last 72 hours. CBG:  Recent Labs Lab 08/04/16 1649  GLUCAP 100*   Lipid Profile: No results for input(s): CHOL, HDL, LDLCALC, TRIG, CHOLHDL, LDLDIRECT in the last 72 hours. Thyroid Function Tests:  Recent Labs  08/04/16 2014  TSH 0.056*   Anemia Panel: No results for input(s): VITAMINB12, FOLATE, FERRITIN, TIBC, IRON, RETICCTPCT in the last 72 hours. Urine analysis:    Component Value Date/Time   COLORURINE YELLOW 08/04/2016 2018   APPEARANCEUR HAZY (A) 08/04/2016 2018   LABSPEC 1.020 08/04/2016 2018   PHURINE 5.0 08/04/2016 2018   GLUCOSEU NEGATIVE 08/04/2016 2018   HGBUR NEGATIVE 08/04/2016 2018   BILIRUBINUR NEGATIVE 08/04/2016 2018   BILIRUBINUR n 07/20/2013 [place] 08/04/2016 2018   PROTEINUR 100 (A) 08/04/2016 2018   UROBILINOGEN 0.2 05/11/2015 2201   NITRITE NEGATIVE 08/04/2016 2018   LEUKOCYTESUR NEGATIVE 08/04/2016 2018   Sepsis Labs: @LABRCNTIP (procalcitonin:4,lacticidven:4) )No results found for this or any previous visit (from the past 240 hour(s)).   Radiological Exams on Admission: Dg Chest 2 View  Result Date: 08/04/2016 CLINICAL DATA:  Altered mental status. EXAM: CHEST  2 VIEW COMPARISON:  05/11/2015. FINDINGS: Cardiomegaly with bilateral from interstitial prominence and bilateral pleural effusions consistent with congestive heart failure. Small bilateral pleural effusions cannot be excluded. No pneumothorax . Interposition of the colon under the right hemidiaphragm patch that noted. Abdominal series is suggested to exclude free intraperitoneal air.  IMPRESSION: 1. Congestive heart failure bilateral from interstitial edema. 2. Interposition of the colon under the right hemidiaphragm. Abdominal series can be obtained for further evaluation to exclude free intraperitoneal air. Critical Value/emergent results were called by telephone at the time of interpretation on 08/04/2016 at 5:38 pm to Dr. Davonna Belling , who verbally acknowledged these results. Electronically Signed   By: Marcello Moores  Register   On: 08/04/2016 17:41   Ct Head Wo Contrast  Result Date: 08/04/2016 CLINICAL DATA:  Acute onset of slurred speech.  Initial encounter. EXAM: CT HEAD WITHOUT CONTRAST TECHNIQUE: Contiguous axial images were obtained from the base of the skull through the vertex without intravenous contrast. COMPARISON:  CT of the head performed 12/01/2012 FINDINGS: Brain: No evidence of acute infarction, hemorrhage, hydrocephalus, extra-axial collection or mass lesion/mass effect. Mild periventricular white matter change likely reflects small vessel ischemic microangiopathy. The posterior fossa, including the cerebellum, brainstem and fourth ventricle, is within normal limits. The third and lateral ventricles, and basal ganglia are unremarkable in appearance. The cerebral hemispheres are symmetric in appearance, with normal gray-white differentiation. No mass effect or midline shift is seen. Vascular: No hyperdense vessel or unexpected calcification. Skull: There is no evidence of fracture; visualized osseous structures are unremarkable in appearance. Sinuses/Orbits: The orbits are within normal limits. The paranasal sinuses and mastoid air cells are well-aerated. Other: No significant soft tissue abnormalities are seen. IMPRESSION: 1. No acute intracranial pathology seen on CT. 2. Mild small vessel ischemic microangiopathy. Electronically Signed   By: Garald Balding M.D.   On: 08/04/2016 17:25   Dg Abd 2 Views  Result Date: 08/04/2016 CLINICAL DATA:  Question pneumoperitoneum on  recent chest radiography EXAM: ABDOMEN - 2 VIEW COMPARISON:  08/04/2016 FINDINGS: Supine and left lateral decubitus imaging is negative for pneumoperitoneum. There is moderate gaseous distention of the colon. IMPRESSION: No evidence of pneumoperitoneum. Electronically Signed   By: Andreas Newport M.D.   On: 08/04/2016 19:37    EKG: Independently reviewed. Sinus tachycardia (rate 103), T-wave flattening laterally.   Assessment/Plan  1. 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 Acute hypoxic respiratory failure, acute CHF  - Saturating 80's on rm air initially, normalized with supplemental O2  - Suspected secondary to acute CHF; she was given 20 mg IV Lasix in ED and this will be continued q12h - Follow daily wts, I/O's, obtain TTE, monitor on telemetry, follow daily BMP - Check troponin and repeat EKG to exclude ischemic etiology for acute CHF    - Empiric Rocephin and azithromycin initiated in ED for possible CAP; given her chronic immunosuppression and difficulty excluding underlying PNA, will continue for now  3. Acute kidney injury  - SCr is 1.56 on admission, up from apparent baseline of 0.9  - Etiology uncertain; she is hypoxic and appears to have pulmonary edema, so Lasix is given  - Hold losartan, repeat chem panel in am    4. Normocytic anemia  - Hgb is 7.7 on admission, down from 11.0 in April 2017  - Pt is altered on arrival and unable to contribute to history; her husband does not know of any melena  or hematochezia  - Type and screen, anemia panel, and FOBT pending    5. Hypothyroid  - Check thyroid studies given the presentation with AMS  - Continue Synthroid    6. 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 Depression  - Difficult to assess on admission d/t AMS - Plan to resume Seroquel and Lexapro once she is appropriate for a diet   8. GERD - No  EGD report on file  - Managed with Zantac at home, will continue H2-blocker with IV Pepcid given NPO status     DVT prophylaxis: SCD's Code Status: Full  Family Communication: Husband updated at bedside Disposition Plan: Admit to telemetry Consults called: None Admission status: Inpatient    Vianne Bulls, MD [facility] Pager [phone]  If 7PM-7AM, please contact night-coverage [email or web address] Password [email or web address]  08/04/2016, 10:09 PM

## 2016-08-04 NOTE — ED Notes (Signed)
Patient is 89% O2 on room air with audible wheezes.  Patient placed on 3L O2 that came up to 94%

## 2016-08-05 ENCOUNTER — Inpatient Hospital Stay (HOSPITAL_COMMUNITY): Payer: BLUE CROSS/BLUE SHIELD

## 2016-08-05 DIAGNOSIS — E039 Hypothyroidism, unspecified: Secondary | ICD-10-CM

## 2016-08-05 DIAGNOSIS — K219 Gastro-esophageal reflux disease without esophagitis: Secondary | ICD-10-CM

## 2016-08-05 DIAGNOSIS — IMO0002 Reserved for concepts with insufficient information to code with codable children: Secondary | ICD-10-CM | POA: Insufficient documentation

## 2016-08-05 DIAGNOSIS — I509 Heart failure, unspecified: Secondary | ICD-10-CM

## 2016-08-05 DIAGNOSIS — M329 Systemic lupus erythematosus, unspecified: Secondary | ICD-10-CM

## 2016-08-05 LAB — IRON AND TIBC
IRON: 9 ug/dL — AB (ref 28–170)
Saturation Ratios: 3 % — ABNORMAL LOW (ref 10.4–31.8)
TIBC: 270 ug/dL (ref 250–450)
UIBC: 261 ug/dL

## 2016-08-05 LAB — CBC
HCT: 26 % — ABNORMAL LOW (ref 36.0–46.0)
HEMOGLOBIN: 9 g/dL — AB (ref 12.0–15.0)
MCH: 28.9 pg (ref 26.0–34.0)
MCHC: 34.6 g/dL (ref 30.0–36.0)
MCV: 83.6 fL (ref 78.0–100.0)
Platelets: 336 10*3/uL (ref 150–400)
RBC: 3.11 MIL/uL — AB (ref 3.87–5.11)
RDW: 18.7 % — ABNORMAL HIGH (ref 11.5–15.5)
WBC: 8.8 10*3/uL (ref 4.0–10.5)

## 2016-08-05 LAB — BASIC METABOLIC PANEL
Anion gap: 13 (ref 5–15)
Anion gap: 14 (ref 5–15)
BUN: 22 mg/dL — AB (ref 6–20)
BUN: 25 mg/dL — AB (ref 6–20)
CHLORIDE: 113 mmol/L — AB (ref 101–111)
CHLORIDE: 108 mmol/L (ref 101–111)
CO2: 17 mmol/L — AB (ref 22–32)
CO2: 18 mmol/L — ABNORMAL LOW (ref 22–32)
CREATININE: 1.54 mg/dL — AB (ref 0.44–1.00)
Calcium: 8.4 mg/dL — ABNORMAL LOW (ref 8.9–10.3)
Calcium: 8.9 mg/dL (ref 8.9–10.3)
Creatinine, Ser: 1.4 mg/dL — ABNORMAL HIGH (ref 0.44–1.00)
GFR calc Af Amer: 42 mL/min — ABNORMAL LOW (ref >60)
GFR calc Af Amer: 47 mL/min — ABNORMAL LOW (ref >60)
GFR calc non Af Amer: 36 mL/min — ABNORMAL LOW (ref >60)
GFR calc non Af Amer: 41 mL/min — ABNORMAL LOW (ref >60)
GLUCOSE: 109 mg/dL — AB (ref 65–99)
Glucose, Bld: 121 mg/dL — ABNORMAL HIGH (ref 65–99)
POTASSIUM: 5.2 mmol/L — AB (ref 3.5–5.1)
Potassium: 4 mmol/L (ref 3.5–5.1)
SODIUM: 143 mmol/L (ref 135–145)
SODIUM: 140 mmol/L (ref 135–145)

## 2016-08-05 LAB — BLOOD GAS, ARTERIAL
Acid-base deficit: 6.7 mmol/L — ABNORMAL HIGH (ref 0.0–2.0)
Acid-base deficit: 5.1 mmol/L — ABNORMAL HIGH (ref 0.0–2.0)
Bicarbonate: 17.3 mmol/L — ABNORMAL LOW (ref 20.0–28.0)
Bicarbonate: 19 mmol/L — ABNORMAL LOW (ref 20.0–28.0)
Drawn by: 312971
Drawn by: 311011
O2 CONTENT: 3 L/min
O2 CONTENT: 5 L/min
O2 SAT: 77.3 %
O2 Saturation: 83.9 %
PCO2 ART: 32.9 mmHg (ref 32.0–48.0)
PH ART: 7.388 (ref 7.350–7.450)
PH ART: 7.381 (ref 7.350–7.450)
PO2 ART: 55.2 mmHg — AB (ref 83.0–108.0)
Patient temperature: 98.6
Patient temperature: 98.6
pCO2 arterial: 29.4 mmHg — ABNORMAL LOW (ref 32.0–48.0)
pO2, Arterial: 47.8 mmHg — ABNORMAL LOW (ref 83.0–108.0)

## 2016-08-05 LAB — INFLUENZA PANEL BY PCR (TYPE A & B)
INFLAPCR: NEGATIVE
INFLBPCR: NEGATIVE

## 2016-08-05 LAB — ABO/RH: ABO/RH(D): A NEG

## 2016-08-05 LAB — HIV ANTIBODY (ROUTINE TESTING W REFLEX): HIV SCREEN 4TH GENERATION: NONREACTIVE

## 2016-08-05 LAB — MAGNESIUM: Magnesium: 2.1 mg/dL (ref 1.7–2.4)

## 2016-08-05 LAB — AMMONIA: AMMONIA: 22 umol/L (ref 9–35)

## 2016-08-05 LAB — SEDIMENTATION RATE: Sed Rate: >140 mm/hr — ABNORMAL HIGH (ref 0–22)

## 2016-08-05 LAB — FOLATE: FOLATE: 32.6 ng/mL (ref >5.9)

## 2016-08-05 LAB — TYPE AND SCREEN
ABO/RH(D): A NEG
Antibody Screen: NEGATIVE

## 2016-08-05 LAB — VITAMIN B12: VITAMIN B 12: 313 pg/mL (ref 180–914)

## 2016-08-05 LAB — CORTISOL: CORTISOL PLASMA: 19.9 ug/dL

## 2016-08-05 LAB — TROPONIN I
TROPONIN I: 0.03 ng/mL — AB (ref <0.03)
Troponin I: 0.06 ng/mL (ref <0.03)
Troponin I: 0.03 ng/mL (ref <0.03)

## 2016-08-05 LAB — CARBAMAZEPINE LEVEL, TOTAL: Carbamazepine Lvl: 7.4 ug/mL (ref 4.0–12.0)

## 2016-08-05 LAB — FERRITIN: Ferritin: 75 ng/mL (ref 11–307)

## 2016-08-05 LAB — T4, FREE: FREE T4: 0.64 ng/dL (ref 0.61–1.12)

## 2016-08-05 LAB — BRAIN NATRIURETIC PEPTIDE: B Natriuretic Peptide: 436.6 pg/mL — ABNORMAL HIGH (ref 0.0–100.0)

## 2016-08-05 MED ORDER — ALBUTEROL SULFATE (2.5 MG/3ML) 0.083% IN NEBU
INHALATION_SOLUTION | RESPIRATORY_TRACT | Status: AC
  Administered 2016-08-05: 2.5 mg
  Filled 2016-08-05: qty 3

## 2016-08-05 MED ORDER — SODIUM CHLORIDE 0.9 % IV SOLN
25.0000 mg | Freq: Once | INTRAVENOUS | Status: AC
  Administered 2016-08-05: 25 mg via INTRAVENOUS
  Filled 2016-08-05: qty 0.5

## 2016-08-05 MED ORDER — LORAZEPAM 2 MG/ML IJ SOLN
1.0000 mg | Freq: Once | INTRAMUSCULAR | Status: AC
  Administered 2016-08-05: 1 mg via INTRAVENOUS

## 2016-08-05 MED ORDER — LORAZEPAM 2 MG/ML IJ SOLN
2.0000 mg | INTRAMUSCULAR | Status: DC | PRN
  Administered 2016-08-05: 2 mg via INTRAVENOUS
  Filled 2016-08-05 (×3): qty 1

## 2016-08-05 MED ORDER — METHYLPREDNISOLONE SODIUM SUCC 125 MG IJ SOLR
80.0000 mg | Freq: Two times a day (BID) | INTRAMUSCULAR | Status: DC
  Administered 2016-08-05 – 2016-08-07 (×5): 80 mg via INTRAVENOUS
  Filled 2016-08-05 (×5): qty 2

## 2016-08-05 MED ORDER — ASPIRIN 300 MG RE SUPP: 300.0000 mg | Freq: Once | RECTAL | Status: DC

## 2016-08-05 MED ORDER — SODIUM CHLORIDE 0.9 % IV SOLN
500.0000 mg | INTRAVENOUS | Status: AC
  Administered 2016-08-05 – 2016-08-06 (×2): 500 mg via INTRAVENOUS
  Filled 2016-08-05 (×4): qty 10

## 2016-08-05 MED ORDER — LORAZEPAM 2 MG/ML IJ SOLN: 0.5000 mg | Freq: Once | INTRAMUSCULAR | Status: DC

## 2016-08-05 MED ORDER — METHYLPREDNISOLONE SODIUM SUCC 40 MG IJ SOLR
40.0000 mg | INTRAMUSCULAR | Status: AC
  Administered 2016-08-05: 40 mg via INTRAVENOUS
  Filled 2016-08-05: qty 1

## 2016-08-05 MED ORDER — METHYLPREDNISOLONE SODIUM SUCC 125 MG IJ SOLR
125.0000 mg | Freq: Once | INTRAMUSCULAR | Status: AC
  Administered 2016-08-05: 125 mg via INTRAVENOUS
  Filled 2016-08-05: qty 2

## 2016-08-05 MED ORDER — FUROSEMIDE 10 MG/ML IJ SOLN
40.0000 mg | INTRAMUSCULAR | Status: AC
  Administered 2016-08-05: 40 mg via INTRAVENOUS
  Filled 2016-08-05: qty 4

## 2016-08-05 MED ORDER — MORPHINE SULFATE (PF) 2 MG/ML IV SOLN: 2.0000 mg | INTRAVENOUS | Status: DC

## 2016-08-05 MED ORDER — MAGNESIUM HYDROXIDE 400 MG/5ML PO SUSP
15.0000 mL | Freq: Every day | ORAL | Status: DC | PRN
Start: 2016-08-05 — End: 2016-08-05

## 2016-08-05 NOTE — Care Management Note (Signed)
Case Management Note  Patient Details  Name: Anna [name] MRN: [id number] Date of Birth: 10-17-1960  Subjective/Objective:     Admitted with CHF               Action/Plan: PCP: [name], MD; has private insurance with [employer] with prescription drug coverage; CM following for DCP  Expected Discharge Date:    possibly 08/09/2016              Expected Discharge Plan:  Home/Self Care  Discharge planning Services  CM Consult  Status of Service:   In progress  Sherrilyn Rist [phone] 08/05/2016, 7:50 AM

## 2016-08-05 NOTE — Consult Note (Signed)
NEURO HOSPITALIST CONSULT NOTE   Requestig physician: Dr. Barbaraann Rondo   Reason for Consult: AMS with question of Lupus cerebritis   History obtained from:   Chart     HPI:                                                                                                                                          Anna [name] is an 56 y.o. female with medical history significant for lupus, hypothyroidism, hypertension, depression, and chronic CHF who presents to the emergency department with 2 months of progressive malaise and generalized weakness with acute worsening in dyspnea and acute encephalopathy. Patient is accompanied by her husband who provides much of the history. She has reportedly been increasingly fatigued and lethargic for the past 2 months and has begun complaining of shortness of breath for the past couple days she has become increasingly altered over the past couple days and was very confused today, prompting her presentation. There has been no known head trauma, use of alcohol or illicit substances, and no fevers. Patient has been coughing, but it does not seem to be productive. Due to Question of possible lupus cerebritis neurology was asked to consult on patient and do lumbar puncture.  Currently patient is extremely agitated, does not follow commands, seems apprehensive, but moving all extremities.  Past Medical History:  Diagnosis Date  . Depressive disorder, not elsewhere classified   . Hypertension   . Hypothyroid    Dr. Wilson Singer  . Kidney stone   . Lupus (systemic lupus erythematosus) ([place])   . Migraine   . Pure hypercholesterolemia   . Stroke [facility]     Past Surgical History:  Procedure Laterality Date  . CESAREAN SECTION    . ECTOPIC PREGNANCY SURGERY     times 2  . LAPAROSCOPY     with rt salpingectomy  . [place] SURGERY  2003  . TONSILLECTOMY    . TOTAL ABDOMINAL HYSTERECTOMY  2004   ovaries retained, DUB  . TUBAL LIGATION  1985     Family History  Problem Relation Age of Onset  . Diabetes Mother   . Hypertension Sister   . Diabetes Maternal Grandmother   . Autoimmune disease Neg Hx      Social History:  reports that she has been smoking Cigarettes.  She has a 17.50 pack-year smoking history. She has never used smokeless tobacco. She reports that she drinks alcohol. She reports that she does not use drugs.  No Known Allergies  MEDICATIONS:  Prior to Admission:  Prescriptions Prior to Admission  Medication Sig Dispense Refill Last Dose  . amLODipine (NORVASC) 10 MG tablet Take 10 mg by mouth daily.   08/04/2016 at Unknown time  . aspirin 500 MG EC tablet Take 500 mg by mouth every 6 (six) hours as needed for pain.   Unknown at Unknown  . Aspirin-Acetaminophen (GOODY BODY PAIN) 500-325 MG PACK Take 1 Package by mouth daily as needed (pain).   Unknown at Unknown  . atorvastatin (LIPITOR) 20 MG tablet Take 20 mg by mouth daily.   08/04/2016 at Unknown time  . carbamazepine (TEGRETOL) 200 MG tablet Take 200-400 mg by mouth See admin instructions. Take 1 tablet (200 mg) by mouth every morning and 2 tablets (400 mg) at night   08/04/2016 at Unknown time  . diazepam (VALIUM) 10 MG tablet Take 5-10 mg by mouth 3 (three) times daily as needed for anxiety.    Unknown at Unknown  . diphenhydrAMINE (BENADRYL) 25 MG tablet Take 25 mg by mouth every 6 (six) hours as needed for allergies.   Unknown at Unknown  . escitalopram (LEXAPRO) 10 MG tablet Take 30 mg by mouth daily.   08/04/2016 at Unknown time  . folic acid (FOLVITE) 1 MG tablet Take 1 mg by mouth daily.   08/04/2016 at Unknown time  . furosemide (LASIX) 20 MG tablet Take 20 mg by mouth 2 (two) times daily.  0 08/04/2016 at Unknown time  . gabapentin (NEURONTIN) 100 MG capsule Take 1 capsule (100 mg total) by mouth 3 (three) times daily. (Patient taking  differently: Take 300 mg by mouth 3 (three) times daily. ) 90 capsule 11 Unknown at Unknown  . HYDROcodone-acetaminophen (NORCO/VICODIN) 5-325 MG tablet Take 1 tablet by mouth 2 (two) times daily.  0 Unknown at Unknown  . hydroxychloroquine (PLAQUENIL) 200 MG tablet Take 200 mg by mouth 2 (two) times daily. Take with food or milk   08/04/2016 at Unknown time  . levothyroxine (SYNTHROID, LEVOTHROID) 50 MCG tablet Take 50 mcg by mouth daily before breakfast.   08/04/2016 at Unknown time  . losartan (COZAAR) 100 MG tablet Take 100 mg by mouth daily.   08/04/2016 at Unknown time  . methocarbamol (ROBAXIN) 500 MG tablet One tablet up to two times daily as needed (Patient taking differently: Take 500 mg by mouth 2 (two) times daily as needed for muscle spasms. ) 60 tablet 6 Unknown at Unknown  . methotrexate (RHEUMATREX) 2.5 MG tablet Take 7.5 mg by mouth once a week.  5 Unknown at Unknown  . metoprolol succinate (TOPROL-XL) 50 MG 24 hr tablet Take 50 mg by mouth daily. Take with or immediately following a meal.   08/04/2016 at AM?  . Phenyleph-Diphenhyd-DM-APAP (THERAFLU SEVERE COLD & COUGH PO) Take 30 mLs by mouth as needed (for cough).   08/04/2016 at Unknown time  . QUEtiapine (SEROQUEL) 300 MG tablet Take 300 mg by mouth at bedtime.    Unknown at Unknown   Scheduled: . amLODipine  10 mg Oral Daily  . aspirin  300 mg Rectal Once  . atorvastatin  20 mg Oral q1800  . azithromycin  500 mg Intravenous Q24H  . carbamazepine  200 mg Oral q morning - 10a   And  . carbamazepine  400 mg Oral QHS  . cefTRIAXone (ROCEPHIN)  IV  1 g Intravenous Q24H  . escitalopram  30 mg Oral Daily  . famotidine (PEPCID) IV  20 mg Intravenous Q12H  . furosemide  20  mg Intravenous Q12H  . gabapentin  100 mg Oral TID  . iron dextran (INFED/DEXFERRUM) infusion  500 mg Intravenous Q24H  . levothyroxine  75 mcg Oral QAC breakfast  . LORazepam  0.5 mg Intravenous Once  . methylPREDNISolone (SOLU-MEDROL) injection  80 mg  Intravenous Q12H  . metoprolol succinate  50 mg Oral Daily  . sodium chloride flush  3 mL Intravenous Q12H     ROS:                                                                                                                                       History obtained from unobtainable from patient due to mental status  General ROS: negative for - chills, fatigue, fever, night sweats, weight gain or weight loss Psychological ROS: negative for - behavioral disorder, hallucinations, memory difficulties, mood swings or suicidal ideation Ophthalmic ROS: negative for - blurry vision, double vision, eye pain or loss of vision ENT ROS: negative for - epistaxis, nasal discharge, oral lesions, sore throat, tinnitus or vertigo Allergy and Immunology ROS: negative for - hives or itchy/watery eyes Hematological and Lymphatic ROS: negative for - bleeding problems, bruising or swollen lymph nodes Endocrine ROS: negative for - galactorrhea, hair pattern changes, polydipsia/polyuria or temperature intolerance Respiratory ROS: negative for - cough, hemoptysis, shortness of breath or wheezing Cardiovascular ROS: negative for - chest pain, dyspnea on exertion, edema or irregular heartbeat Gastrointestinal ROS: negative for - abdominal pain, diarrhea, hematemesis, nausea/vomiting or stool incontinence Genito-Urinary ROS: negative for - dysuria, hematuria, incontinence or urinary frequency/urgency Musculoskeletal ROS: negative for - joint swelling or muscular weakness Neurological ROS: as noted in HPI Dermatological ROS: negative for rash and skin lesion changes   Blood pressure 138/86, pulse 91, temperature 98.3 F (36.8 C), temperature source Oral, resp. rate 20, height 5\' 7"  (1.702 m), weight 90.6 kg (199 lb 12.8 oz), last menstrual period 07/07/2002, SpO2 91 %.   Neurologic Examination:                                                                                                      HEENT-   Normocephalic, no lesions, without obvious abnormality.  Normal external eye and conjunctiva.  Normal TM's bilaterally.  Normal auditory canals and external ears. Normal external nose, mucus membranes and septum.  Normal pharynx. Cardiovascular- S1, S2 normal, pulses palpable throughout   Lungs- chest clear, no wheezing, rales, normal symmetric air entry Abdomen- normal findings: bowel sounds normal Extremities- no edema Lymph-no adenopathy  palpable Musculoskeletal-no joint tenderness, deformity or swelling Skin-warm and dry, no hyperpigmentation, vitiligo, or suspicious lesions  Neurological Examination Mental Status: Patient is alert at this point she is not able to follow commands. She is not able to answer questions. She seems apprehensive and agitated at times. Cranial Nerves: II:  Blinks to threat bilaterally, round, reactive to light and accommodation III,IV, VI: ptosis not present, extra-ocular motions intact bilaterally V,VII: smile symmetric, facial light touch sensation normal bilaterally VIII: hearing normal bilaterally IX,X: uvula rises symmetrically XI: bilateral shoulder shrug XII: midline tongue extension Motor: Right : Upper extremity   5/5    Left:     Upper extremity   5/5  Lower extremity   5/5     Lower extremity   5/5 Tone and bulk:normal tone throughout; no atrophy noted Sensory: Pinprick and light touch intact throughout, bilaterally Deep Tendon Reflexes: 2+ and symmetric throughout Plantars: Right: downgoing   Left: downgoing Cerebellar: normal finger-to-nose,  Gait: Not tested      Lab Results: Basic Metabolic Panel:  Recent Labs Lab 08/04/16 1623 08/05/16 0405  NA 138 143  K 3.6 4.0  CL 110 113*  CO2 15* 17*  GLUCOSE 100* 109*  BUN 20 22*  CREATININE 1.58* 1.54*  CALCIUM 7.9* 8.4*  MG  --  2.1    Liver Function Tests:  Recent Labs Lab 08/04/16 1623  AST 18  ALT 11*  ALKPHOS 87  BILITOT 0.5  PROT 6.9  ALBUMIN 2.5*   No results  for input(s): LIPASE, AMYLASE in the last 168 hours.  Recent Labs Lab 08/05/16 0405  AMMONIA 22    CBC:  Recent Labs Lab 08/04/16 1623  WBC 13.0*  NEUTROABS 10.5*  HGB 7.7*  HCT 21.3*  MCV 88.8  PLT 426*    Cardiac Enzymes:  Recent Labs Lab 08/04/16 2315 08/05/16 0405 08/05/16 1102  TROPONINI 0.06* 0.03* 0.03*    Lipid Panel: No results for input(s): CHOL, TRIG, HDL, CHOLHDL, VLDL, LDLCALC in the last 168 hours.  CBG:  Recent Labs Lab 08/04/16 1649  GLUCAP 100*    Microbiology: Results for orders placed or performed during the hospital encounter of 12/12/10  Urine culture     Status: None   Collection Time: 12/12/10 11:07 AM  Result Value Ref Range Status   Specimen Description URINE, RANDOM  Final   Special Requests NONE  Final   Culture  Setup Time [id number]  Final   Colony Count NO GROWTH  Final   Culture NO GROWTH  Final   Report Status 12/13/2010 FINAL  Final    Coagulation Studies:  Recent Labs  08/04/16 1623  LABPROT 18.0*  INR 1.47    Imaging: Dg Chest 2 View  Result Date: 08/04/2016 CLINICAL DATA:  Altered mental status. EXAM: CHEST  2 VIEW COMPARISON:  05/11/2015. FINDINGS: Cardiomegaly with bilateral from interstitial prominence and bilateral pleural effusions consistent with congestive heart failure. Small bilateral pleural effusions cannot be excluded. No pneumothorax . Interposition of the colon under the right hemidiaphragm patch that noted. Abdominal series is suggested to exclude free intraperitoneal air. IMPRESSION: 1. Congestive heart failure bilateral from interstitial edema. 2. Interposition of the colon under the right hemidiaphragm. Abdominal series can be obtained for further evaluation to exclude free intraperitoneal air. Critical Value/emergent results were called by telephone at the time of interpretation on 08/04/2016 at 5:38 pm to Dr. Davonna Belling , who verbally acknowledged these results. Electronically Signed    By: Marcello Moores  Register  On: 08/04/2016 17:41   Ct Head Wo Contrast  Result Date: 08/04/2016 CLINICAL DATA:  Acute onset of slurred speech.  Initial encounter. EXAM: CT HEAD WITHOUT CONTRAST TECHNIQUE: Contiguous axial images were obtained from the base of the skull through the vertex without intravenous contrast. COMPARISON:  CT of the head performed 12/01/2012 FINDINGS: Brain: No evidence of acute infarction, hemorrhage, hydrocephalus, extra-axial collection or mass lesion/mass effect. Mild periventricular white matter change likely reflects small vessel ischemic microangiopathy. The posterior fossa, including the cerebellum, brainstem and fourth ventricle, is within normal limits. The third and lateral ventricles, and basal ganglia are unremarkable in appearance. The cerebral hemispheres are symmetric in appearance, with normal gray-white differentiation. No mass effect or midline shift is seen. Vascular: No hyperdense vessel or unexpected calcification. Skull: There is no evidence of fracture; visualized osseous structures are unremarkable in appearance. Sinuses/Orbits: The orbits are within normal limits. The paranasal sinuses and mastoid air cells are well-aerated. Other: No significant soft tissue abnormalities are seen. IMPRESSION: 1. No acute intracranial pathology seen on CT. 2. Mild small vessel ischemic microangiopathy. Electronically Signed   By: Garald Balding M.D.   On: 08/04/2016 17:25   Dg Chest Port 1 View  Result Date: 08/05/2016 CLINICAL DATA:  HYPOXIA.HX HTN,LUPUS EXAM: PORTABLE CHEST - 1 VIEW COMPARISON:  08/04/2016 FINDINGS: Significant progression of bilateral interstitial edema or infiltrates. Some airspace opacities in the lung bases are slightly more conspicuous. Heart size upper limits normal for technique. No definite effusion.  No pneumothorax. Visualized bones unremarkable. IMPRESSION: 1. Worsening bilateral edema/infiltrates. Electronically Signed   By: Lucrezia Europe M.D.   On:  08/05/2016 09:54   Dg Abd 2 Views  Result Date: 08/04/2016 CLINICAL DATA:  Question pneumoperitoneum on recent chest radiography EXAM: ABDOMEN - 2 VIEW COMPARISON:  08/04/2016 FINDINGS: Supine and left lateral decubitus imaging is negative for pneumoperitoneum. There is moderate gaseous distention of the colon. IMPRESSION: No evidence of pneumoperitoneum. Electronically Signed   By: Andreas Newport M.D.   On: 08/04/2016 19:37       Assessment and plan per attending neurologist  Etta Quill PA-C [facility] [phone]  08/05/2016, 2:43 PM  I reviewed the above note when I examined her, she was able to follow commands and answer some questions. She is confused, not oriented. She  Appears to have asterixis when attempting to hold a cup of ice she spills it on herself. She gave inconsistent answers to visual field questions.  I asked her if it was possible if she was taking more gabapentin than typical and she said "well I am in pain." But then denied taking more.   Assessment/Plan: 56 year old female with altered mental status in the setting of likely bronchitis or pneumonia. The presence of asterixis does make me think more toxic/metabolic etiology such as gabapentin toxicity. Seroquel toxicity I think that also be responsible. She has had a waxing/waning mental status in the past, and this could be due to medication effect.  With her history of lupus, however, this is considerably more complicated and I agree with obtaining an MRI as well as assessing for CNS inflammation with lumbar puncture. This is attempted at the bedside, but unfortunately due to her habitus was not completed.  One other possibility would be posterior reversible encephalopathy syndrome, given that she is chronically immunosuppressed.  1) hold gabapentin 2) hold Seroquel 3) agree with gabapentin level, carbamazepine level 4) LP under fluoroscopy for cells, glucose, protein. 5) agree with MRI  brain  Roland Rack, MD  [facility] [phone]  If 7pm- 7am, please page neurology on call as listed in [place].

## 2016-08-05 NOTE — Progress Notes (Signed)
Patient lethargic but  able to response to verbal stimuli. Patient  continuously having a respiratory distress using accessory muscle while rapidly breathing   [place]ider noted upon auscultation. Charge nurse, MD, Rapid response and Respiratory Therapist notified and immediately responded. Patient spouse at bedside. Solu medrol 40 mg IV  and Lasix  20 mg IV  given. Chest X-ray and ABG gas done.Awaiting for response.  Patient in venturi mask  5L / 92% O2 at this time. Marland KitchenMarland Kitchen

## 2016-08-05 NOTE — Progress Notes (Signed)
PROGRESS NOTE    Anna [name]  XFG:[id number] DOB: 09/25/1960 DOA: 08/04/2016 PCP: [name], MD   Chief Complaint  Patient presents with  . Altered Mental Status  . Shortness of Breath    Brief Narrative:  HPI on 08/04/2016 by Dr. Christia Reading Opyd Anna [name] is a 56 y.o. female with medical history significant for lupus, hypothyroidism, hypertension, depression, and chronic CHF who presents to the emergency department with 2 months of progressive malaise and generalized weakness with acute worsening in dyspnea and acute encephalopathy. Patient is accompanied by her husband who provides much of the history. She has reportedly been increasingly fatigued and lethargic for the past 2 months and has begun complaining of shortness of breath for the past couple days she has become increasingly altered over the past couple days and was very confused today, prompting her presentation. There has been no known head trauma, use of alcohol or illicit substances, and no fevers. Patient has been coughing, but it does not seem to be productive. No leg swelling has been noted. There has been no recent changes in the patient's medications. No vomiting or diarrhea per report of the patient's husband. Appetite has been poor and the patient has been sleeping more.  Interim history Rapid response called as patient was altered and hypoxic. Patient currently stable.  Continue to monitor mental status, neuro checks. MRI brain ordered.  Assessment & Plan   Acute encephalopathy  -Pt has been disoriented with alternating lethargy and agitation -Has been ongoing for a couple of months.  -Unclear etiology. Question of Seroquel OD? -Drug screen +benzo -Influenza negative -Blood and urine cultures pending  -TSH 0.056, will obtain FT4 -Vitamin B12 313 -Ammonia 22 -RPR pending -Started on solumedrol given her history of lupus  -CT head unremarkable -Will obtain MRI -Neurology consulted and  appreciated -Will obtain gabapentin and tegretol levels  Acute hypoxic respiratory failure/Possible Acute CHF  -Saturating 80's on rm air initially, normalized with supplemental O2  -CXR showed CHF with interstitial edema -Patient does take lasix at home -Continue IV lasix -Monitor intake/output, daily weights  -Echocardiogram ordered -ABG shows compensation (pH 7.388, pCO2 29.4, pO2 55.2) -continue supplemental oxygen  Possible Community Acquired Pneumonia -CXR today showed worsening B/L edema/ infiltrates -Continue azithromycin/ceftriaxone   Acute kidney injury  -Creatinine 1.58 on admission -Baseline 1.1 -Losartan held -Continue to monitor BMP closely given diuresis   Normocytic anemia  -Hemoglobin 7.7 on admission, down from 11.0 in April 2017  -FOBT pending -Anemia panel: Iron 9, Ferritin 75, folate 32.6, B12 313 -Will give dose of IV iron  Hypothyroid  -TSH 0.05, will obtain FT4 -Continue Synthroid    SLE  -Managed with Plaquenil and methotrexate at home; Plaquenil held in light of worsened anemia  -?Cerebitis -Continue IV solumedrol -Neurology consulted and appreciated  Depression  -Given possible seroquel OD, will hold for now -contiue lexapro  GERD -Continue pepcid  DVT Prophylaxis  SCDs  Code Status: Full  Family Communication: None at bedside  Disposition Plan: Admitted. Continue close monitoring.  If needed, will transfer to stepdown  Consultants None  Procedures  None  Antibiotics   Anti-infectives    Start     Dose/Rate Route Frequency Ordered Stop   08/05/16 1915  cefTRIAXone (ROCEPHIN) 1 g in dextrose 5 % 50 mL IVPB     1 g 100 mL/hr over 30 Minutes Intravenous Every 24 hours 08/04/16 2209 08/11/16 1914   08/05/16 1915  azithromycin (ZITHROMAX) 500 mg in dextrose 5 %  250 mL IVPB     500 mg 250 mL/hr over 60 Minutes Intravenous Every 24 hours 08/04/16 2209 08/11/16 1914   08/04/16 1915  cefTRIAXone (ROCEPHIN) 1 g in dextrose 5  % 50 mL IVPB     1 g 100 mL/hr over 30 Minutes Intravenous  Once 08/04/16 1901 08/04/16 2116   08/04/16 1915  azithromycin (ZITHROMAX) 500 mg in dextrose 5 % 250 mL IVPB     500 mg 250 mL/hr over 60 Minutes Intravenous  Once 08/04/16 1901 08/04/16 2218      Subjective:   Anna [name] seen and examined today.  Patient lethargic, can follow commands and answer simple questions.   States she feel dehydrated.   Objective:   Vitals:   08/05/16 0800 08/05/16 0819 08/05/16 0900 08/05/16 1159  BP:  (!) 154/85 132/82 138/86  Pulse:  (!) 104 (!) 102 91  Resp:  (!) 26 (!) 22 20  Temp:  98.5 F (36.9 C)  98.3 F (36.8 C)  TempSrc:    Oral  SpO2: 91% 90% 92% 91%  Weight:      Height:        Intake/Output Summary (Last 24 hours) at 08/05/16 1258 Last data filed at 08/05/16 1204  Gross per 24 hour  Intake              360 ml  Output             2050 ml  Net            -1690 ml   Filed Weights   08/05/16 0645  Weight: 90.6 kg (199 lb 12.8 oz)    Exam  General: Well developed, well nourished, NAD  HEENT: NCAT,  mucous membranes dry  Cardiovascular: S1 S2 auscultated, RRR, 1/6SEM  Respiratory: B/L crackles, difficult to auscultate given her current condition  Abdomen: Soft, nontender, nondistended, + bowel sounds  Extremities: warm dry without cyanosis clubbing. 2+ LE edema  Neuro: Awake, lethargic, moves extremities spontaneously. Speech slurred  Skin: Without rashes exudates or nodules  Psych: cannot assess   Data Reviewed: I have personally reviewed following labs and imaging studies  CBC:  Recent Labs Lab 08/04/16 1623  WBC 13.0*  NEUTROABS 10.5*  HGB 7.7*  HCT 21.3*  MCV 88.8  PLT 782*   Basic Metabolic Panel:  Recent Labs Lab 08/04/16 1623 08/05/16 0405  NA 138 143  K 3.6 4.0  CL 110 113*  CO2 15* 17*  GLUCOSE 100* 109*  BUN 20 22*  CREATININE 1.58* 1.54*  CALCIUM 7.9* 8.4*  MG  --  2.1   GFR: Estimated Creatinine Clearance: 46.6  mL/min (by C-G formula based on SCr of 1.54 mg/dL (H)). Liver Function Tests:  Recent Labs Lab 08/04/16 1623  AST 18  ALT 11*  ALKPHOS 87  BILITOT 0.5  PROT 6.9  ALBUMIN 2.5*   No results for input(s): LIPASE, AMYLASE in the last 168 hours.  Recent Labs Lab 08/05/16 0405  AMMONIA 22   Coagulation Profile:  Recent Labs Lab 08/04/16 1623  INR 1.47   Cardiac Enzymes:  Recent Labs Lab 08/04/16 2315 08/05/16 0405 08/05/16 1102  TROPONINI 0.06* 0.03* 0.03*   BNP (last 3 results) No results for input(s): PROBNP in the last 8760 hours. HbA1C: No results for input(s): HGBA1C in the last 72 hours. CBG:  Recent Labs Lab 08/04/16 1649  GLUCAP 100*   Lipid Profile: No results for input(s): CHOL, HDL, LDLCALC, TRIG, CHOLHDL, LDLDIRECT in the last  72 hours. Thyroid Function Tests:  Recent Labs  08/04/16 2014  TSH 0.056*   Anemia Panel:  Recent Labs  08/04/16 2315  VITAMINB12 313  FOLATE 32.6  FERRITIN 75  TIBC 270  IRON 9*  RETICCTPCT 1.8   Urine analysis:    Component Value Date/Time   COLORURINE YELLOW 08/04/2016 2018   APPEARANCEUR HAZY (A) 08/04/2016 2018   LABSPEC 1.020 08/04/2016 2018   PHURINE 5.0 08/04/2016 2018   GLUCOSEU NEGATIVE 08/04/2016 2018   HGBUR NEGATIVE 08/04/2016 2018   BILIRUBINUR NEGATIVE 08/04/2016 2018   BILIRUBINUR n 07/20/2013 0948   KETONESUR NEGATIVE 08/04/2016 2018   PROTEINUR 100 (A) 08/04/2016 2018   UROBILINOGEN 0.2 05/11/2015 2201   NITRITE NEGATIVE 08/04/2016 2018   LEUKOCYTESUR NEGATIVE 08/04/2016 2018   Sepsis Labs: @LABRCNTIP (procalcitonin:4,lacticidven:4)  )No results found for this or any previous visit (from the past 240 hour(s)).    Radiology Studies: Dg Chest 2 View  Result Date: 08/04/2016 CLINICAL DATA:  Altered mental status. EXAM: CHEST  2 VIEW COMPARISON:  05/11/2015. FINDINGS: Cardiomegaly with bilateral from interstitial prominence and bilateral pleural effusions consistent with congestive  heart failure. Small bilateral pleural effusions cannot be excluded. No pneumothorax . Interposition of the colon under the right hemidiaphragm patch that noted. Abdominal series is suggested to exclude free intraperitoneal air. IMPRESSION: 1. Congestive heart failure bilateral from interstitial edema. 2. Interposition of the colon under the right hemidiaphragm. Abdominal series can be obtained for further evaluation to exclude free intraperitoneal air. Critical Value/emergent results were called by telephone at the time of interpretation on 08/04/2016 at 5:38 pm to Dr. Davonna Belling , who verbally acknowledged these results. Electronically Signed   By: Marcello Moores  Register   On: 08/04/2016 17:41   Ct Head Wo Contrast  Result Date: 08/04/2016 CLINICAL DATA:  Acute onset of slurred speech.  Initial encounter. EXAM: CT HEAD WITHOUT CONTRAST TECHNIQUE: Contiguous axial images were obtained from the base of the skull through the vertex without intravenous contrast. COMPARISON:  CT of the head performed 12/01/2012 FINDINGS: Brain: No evidence of acute infarction, hemorrhage, hydrocephalus, extra-axial collection or mass lesion/mass effect. Mild periventricular white matter change likely reflects small vessel ischemic microangiopathy. The posterior fossa, including the cerebellum, brainstem and fourth ventricle, is within normal limits. The third and lateral ventricles, and basal ganglia are unremarkable in appearance. The cerebral hemispheres are symmetric in appearance, with normal gray-white differentiation. No mass effect or midline shift is seen. Vascular: No hyperdense vessel or unexpected calcification. Skull: There is no evidence of fracture; visualized osseous structures are unremarkable in appearance. Sinuses/Orbits: The orbits are within normal limits. The paranasal sinuses and mastoid air cells are well-aerated. Other: No significant soft tissue abnormalities are seen. IMPRESSION: 1. No acute intracranial  pathology seen on CT. 2. Mild small vessel ischemic microangiopathy. Electronically Signed   By: Garald Balding M.D.   On: 08/04/2016 17:25   Dg Chest Port 1 View  Result Date: 08/05/2016 CLINICAL DATA:  HYPOXIA.HX HTN,LUPUS EXAM: PORTABLE CHEST - 1 VIEW COMPARISON:  08/04/2016 FINDINGS: Significant progression of bilateral interstitial edema or infiltrates. Some airspace opacities in the lung bases are slightly more conspicuous. Heart size upper limits normal for technique. No definite effusion.  No pneumothorax. Visualized bones unremarkable. IMPRESSION: 1. Worsening bilateral edema/infiltrates. Electronically Signed   By: Lucrezia Europe M.D.   On: 08/05/2016 09:54   Dg Abd 2 Views  Result Date: 08/04/2016 CLINICAL DATA:  Question pneumoperitoneum on recent chest radiography EXAM: ABDOMEN - 2 VIEW COMPARISON:  08/04/2016 FINDINGS: Supine and left lateral decubitus imaging is negative for pneumoperitoneum. There is moderate gaseous distention of the colon. IMPRESSION: No evidence of pneumoperitoneum. Electronically Signed   By: Andreas Newport M.D.   On: 08/04/2016 19:37     Scheduled Meds: . amLODipine  10 mg Oral Daily  . aspirin  300 mg Rectal Once  . atorvastatin  20 mg Oral q1800  . azithromycin  500 mg Intravenous Q24H  . carbamazepine  200 mg Oral q morning - 10a   And  . carbamazepine  400 mg Oral QHS  . cefTRIAXone (ROCEPHIN)  IV  1 g Intravenous Q24H  . escitalopram  30 mg Oral Daily  . famotidine (PEPCID) IV  20 mg Intravenous Q12H  . furosemide  20 mg Intravenous Q12H  . gabapentin  100 mg Oral TID  . levothyroxine  75 mcg Oral QAC breakfast  . LORazepam  0.5 mg Intravenous Once  . methylPREDNISolone (SOLU-MEDROL) injection  80 mg Intravenous Q12H  . metoprolol succinate  50 mg Oral Daily  . QUEtiapine  300 mg Oral QHS  . sodium chloride flush  3 mL Intravenous Q12H   Continuous Infusions:   LOS: 1 day   Time Spent in minutes   30 minutes  Vung Kush D.O. on  08/05/2016 at 12:58 PM  Between 7am to 7pm - Pager - [phone]  After 7pm go to [email or web address] - password [email or web address]  And look for the night coverage person covering for me after hours  [facility] Office  [phone]

## 2016-08-05 NOTE — Evaluation (Signed)
Physical Therapy Evaluation Patient Details Name: Anna [name] MRN: [id number] DOB: 01/09/1961 Today's Date: 08/05/2016   History of Present Illness  56 yo female wtih onset of hypoxia, acute encephalopathy, flat troponins and pulm edema with SOB and AKI was admitted and noted to have acute CHF.  PMHx:  stroke, SLE,   Clinical Impression  Pt was notably struggling to get up to side of bed today, weak and with CNA encouraged her to help to sit up to [facility].  Her plan is to follow acutely to shorten her stay in SNF, and focus on standing and transfers as needed as pt is home alone when husband goes to work.      Follow Up Recommendations SNF    Equipment Recommendations  None recommended by PT    Recommendations for Other Services       Precautions / Restrictions Precautions Precautions: Fall (telemetry) Restrictions Weight Bearing Restrictions: No      Mobility  Bed Mobility Overal bed mobility: Needs Assistance Bed Mobility: Supine to Sit;Sit to Supine     Supine to sit: Mod assist;+2 for physical assistance;+2 for safety/equipment;HOB elevated Sit to supine: Mod assist;Max assist   General bed mobility comments: Pt ewas unsafe to sit up and tends to scoot too close to EOB  Transfers Overall transfer level: Needs assistance Equipment used: Rolling walker (2 wheeled);2 person hand held assist;1 person hand held assist Transfers: Sit to/from [facility] Sit to Stand: Mod assist;+2 physical assistance;+2 safety/equipment;From elevated surface Stand pivot transfers: Mod assist;+2 physical assistance;+2 safety/equipment;From elevated surface       General transfer comment: 1-2 person assist and is safer with 2  Ambulation/Gait             General Gait Details: unable due to her tendency to have muscle jerks, and slow to respond to vc's  Stairs            Wheelchair Mobility    Modified Rankin (Stroke Patients Only)       Balance  Overall balance assessment: History of Falls;Needs assistance Sitting-balance support: Bilateral upper extremity supported;Feet supported Sitting balance-Leahy Scale: Fair       Standing balance-Leahy Scale: Poor                               Pertinent Vitals/Pain Pain Assessment: No/denies pain    Home Living Family/patient expects to be discharged to:: Skilled nursing facility                      Prior Function Level of [place]: Needs assistance   Gait / Transfers Assistance Needed: 2 month history of weakness, short gait distances with   ADL's / Homemaking Assistance Needed: husband works and pt has been requiring help from him         Hand Dominance        Extremity/Trunk Assessment   Upper Extremity Assessment Upper Extremity Assessment: Generalized weakness    Lower Extremity Assessment Lower Extremity Assessment: Generalized weakness    Cervical / Trunk Assessment Cervical / Trunk Assessment: Normal  Communication   Communication: No difficulties  Cognition Arousal/Alertness: Lethargic Behavior During Therapy: Flat affect;Impulsive Overall Cognitive Status: Difficult to assess                      General Comments General comments (skin integrity, edema, etc.): Pt is both lethargic and tends to scoot nearer to  EOB and does not have a safe technique to allow her to try this alone    Exercises     Assessment/Plan    PT Assessment Patient needs continued PT services  PT Problem List Decreased range of motion;Decreased strength;Decreased activity tolerance;Decreased balance;Decreased mobility;Decreased coordination;Decreased cognition;Decreased knowledge of use of DME;Decreased safety awareness;Cardiopulmonary status limiting activity;Decreased skin integrity          PT Treatment Interventions DME instruction;Gait training;Stair training;Functional mobility training;Therapeutic activities;Therapeutic exercise;Balance  training;Neuromuscular re-education;Patient/family education    PT Goals (Current goals can be found in the Care Plan section)  Acute Rehab PT Goals Patient Stated Goal: to get up to side of bed PT Goal Formulation: With patient/family Time For Goal Achievement: 08/19/16 Potential to Achieve Goals: Good    Frequency Min 3X/week   Barriers to discharge Decreased caregiver support;Inaccessible home environment stairs and is home alone    Co-evaluation               End of Session Equipment Utilized During Treatment: Gait belt;Oxygen Activity Tolerance: Patient limited by fatigue;Treatment limited secondary to medical complications (Comment) (O2 running 2.5 L on the wall) Patient left: in bed;with call bell/phone within reach;with bed alarm set;with family/visitor present Nurse Communication: Mobility status         Time: 6728-9791 PT Time Calculation (min) (ACUTE ONLY): 32 min   Charges:   PT Evaluation $PT Eval Moderate Complexity: 1 Procedure PT Treatments $Therapeutic Activity: 8-22 mins   PT G Codes:        [name] 2016/08/18, 3:51 PM   [name], PT MS Acute Rehab Dept. Number: [place]

## 2016-08-05 NOTE — ED Notes (Signed)
Family at bedside. 

## 2016-08-05 NOTE — Progress Notes (Signed)
[place]

## 2016-08-05 NOTE — ED Notes (Signed)
Husband has left, will be back with patient's meds in the a.m.

## 2016-08-05 NOTE — Progress Notes (Signed)
Received report for night shift RN. Pt extremely lethargic and confused with slurred speech, poor facial motor skills, and poor attention/concentration. Called RR and MD to bedside to evaluate patient. Will monitor.

## 2016-08-05 NOTE — Progress Notes (Signed)
Transport is here to pick up the patient for MRI but patient with acute dyspnea and breathing using accessory muscle. MD notified.Awaiting for response.

## 2016-08-05 NOTE — ED Notes (Signed)
Spoke with admitting MD.  Will hold PO meds at this time.  Giving steroids and pepcid now.

## 2016-08-05 NOTE — Progress Notes (Signed)
Pt complaining of recurrent headaches. Paged Dr. Ree Kida, received verbal orders of MRI WO contrast, and one time dose 0.5mg  ativan IV if needed for MRI.

## 2016-08-05 NOTE — Progress Notes (Signed)
Patient arrived in the unit accompanied by NT via stretcher. Patient alert able to response to verbal stimuli but patient is lethargic. O2 2L in  place patient breathing rapidlly  using accessory muscles with mouth open.  Denies any pain.[name] CCMD notified.Charge nurse at bedside.

## 2016-08-05 NOTE — Procedures (Signed)
Indication: Evaluation for cerebral inflammation and cerebritis  Risks of the procedure were dicussed with the patient including post-LP headache, bleeding, infection, weakness/numbness of legs(radiculopathy), death.  The patient's proxy agreed and written consent was obtained.   The patient was prepped and draped, and using sterile technique a 20 gauge quinke spinal needle was attempted to be inserted into the L4/L5 space. Patient would not maintain fetal position in fact multiple times she would arch her back. Patient would not remain still. They became to a point where patient made the procedure unsafe and the procedure had to be stopped. In addition no landmarks were able to be felt secondary to significant subcutaneous adipose tissue.    LP will need to be re-attempted under IR where visualization of intervertebral spaces can be seen. Etta Quill PA-C [facility] [phone]  M-F  (8:30 am- 4 PM)  08/05/2016, 2:52 PM  I have reviewed the above note.  Roland Rack, MD [facility] [phone]  If 7pm- 7am, please page neurology on call as listed in [place].

## 2016-08-05 NOTE — Progress Notes (Signed)
Shift event: RN paged earlier that MRI was there to pick up pt, but pt was having dyspnea with use of accessory muscles, hypoxia, and SOB. O2 sat on 5L 89%. BP 132/84, HR 78, RR 31. Per Santiago Glad, Agricultural consultant, pt's lungs with stridor and crackles. Santiago Glad called RT and [place]. Asked RN to bump up O2 to a venti mask. Orders placed for Stat ABG, stat CXR, Solumedrol 40mg  IV, and Lasix 40mg  IV. NP reviewed chart and pt was admitted for acute CHF, likely CAP, and noted to have rise in creat over baseline.  NP, after above orders, spoke with RRRN, Lolita Patella. Pt is awake, speech is slurred slightly and she is confused. In review of chart, this is same as rounding MD exam earlier today. Pt has been voiding well all day.  ABG-low PO2, normal pH, low PCO2. O2 sat was 83% but was on [place]. O2 sat 100% on 50% venti mask. CXR showed possible worsening infiltrates and increased pulmonary edema.  Repeated Lasix 40mg  IV.  Pt already on antibiotics for CAP.  NP spoke with RRRN again. Pt is a tad more alert and breathing has slowed some over previous. VSS. For now, will leave pt on floor and see how she does given she looks better than when RN called earlier.  Also, NP d/c'd all sedative medications after this episode and review of chart. R/p CBC and BMP now.  Report given to my colleague, Schorr NP, on campus at [facility] who takes over care at 2100 hrs.  KJKG, NP [facility]

## 2016-08-05 NOTE — Progress Notes (Signed)
Pt. Alert to self, VS obtained With diaphramatic breathing, respirations 31, O2 sat. 89% on 5L.  On call for [facility] paged. RRT and Rapid response RN at pts. Bedside. Pts. Primary RN aware.

## 2016-08-05 NOTE — Significant Event (Signed)
Rapid Response Event Note  Overview: Time Called: 0830 Arrival Time: 0830 Event Type: Respiratory, Neurologic  Initial Focused Assessment: Patient admitted with AMS and progressive fatigue. This am she frequently falls asleep.  She has garbled speech per the notes she has had since admission.  She feels very weak and has some spastic movements.   BP 154/85  ST 104  RR 24 O2 sat 90-91% on 2L [place].  Occasionally she drifts to 88%  Lung sounds clear, some wheezes.  Strong dry cough. Mouth dry with caked secretions. Dr Ree Kida at bedside  Interventions: Oral care ABG done O2 increased to 4L [place]  O2 sats 91-92% Patient intermittently alert and conversant, garbled speech ABG results 7.38/29/55/17 Increased O2 to 6L [place] O2 sats 94%   Plan of Care (if not transferred): NPO except ice chips Continue to monitor RN to call if assistance needed.  Event Summary: Name of Physician Notified: Dr Ree Kida at bedside at      at       Event End Time: [place]  Raliegh Ip

## 2016-08-05 NOTE — Significant Event (Signed)
Rapid Response Event Note RN called for pt lethargic with increased WOB Overview: Time Called: 1956 Arrival Time: 1958 Event Type: Respiratory  Initial Focused Assessment: Pt easily aroused to verbal stimuli, alert to person and place, accessory muscle use for breathing, SpO2 86% 5L [place]. Lungs diminished through out, crackles noted to base bilateral. Baltazar Najjar NP paged PTA. Per spouse this has been pt's behavior x2 months, states "she's breathing a little bit harder than normal, but not that much more." Rounding MD notes support this has been patients norm during rounds today, as well as admitting ED MD notes.    Interventions: New orders for stat CXR, ABG, Solumedrol 40mg  IVP, Lasix 40 mg IVP x2, place pt on vent mask. SpO2 100% on 50% venti mask. abg 7.38/32.9/47.8/19.0 Plan of Care (if not transferred): Christine RN made aware to continue to monitor pt and call for any changes.  Event Summary: Name of Physician Notified: Baltazar Najjar NP  (paged PTA RRT ) at  (paged PTA )    at    Outcome: Stayed in room and stabalized  Event End Time: 0935  [place] Pickup

## 2016-08-05 NOTE — ED Notes (Signed)
Pulled Anna [name] up in bed pulled gown up over her chest.place her nasal cannal on.

## 2016-08-06 ENCOUNTER — Inpatient Hospital Stay (HOSPITAL_COMMUNITY): Payer: BLUE CROSS/BLUE SHIELD

## 2016-08-06 ENCOUNTER — Encounter (HOSPITAL_COMMUNITY): Payer: Self-pay | Admitting: General Practice

## 2016-08-06 DIAGNOSIS — R0600 Dyspnea, unspecified: Secondary | ICD-10-CM

## 2016-08-06 DIAGNOSIS — R4182 Altered mental status, unspecified: Secondary | ICD-10-CM

## 2016-08-06 DIAGNOSIS — F329 Major depressive disorder, single episode, unspecified: Secondary | ICD-10-CM

## 2016-08-06 DIAGNOSIS — D649 Anemia, unspecified: Secondary | ICD-10-CM

## 2016-08-06 LAB — BASIC METABOLIC PANEL
Anion gap: 9 (ref 5–15)
BUN: 27 mg/dL — AB (ref 6–20)
CALCIUM: 9.1 mg/dL (ref 8.9–10.3)
CHLORIDE: 107 mmol/L (ref 101–111)
CO2: 23 mmol/L (ref 22–32)
CREATININE: 1.39 mg/dL — AB (ref 0.44–1.00)
GFR calc non Af Amer: 41 mL/min — ABNORMAL LOW (ref >60)
GFR, EST AFRICAN AMERICAN: 48 mL/min — AB (ref >60)
Glucose, Bld: 131 mg/dL — ABNORMAL HIGH (ref 65–99)
Potassium: 4.1 mmol/L (ref 3.5–5.1)
SODIUM: 139 mmol/L (ref 135–145)

## 2016-08-06 LAB — ECHOCARDIOGRAM COMPLETE
Height: 67 in
Weight: 3054.4 oz

## 2016-08-06 LAB — GLUCOSE, CSF: Glucose, CSF: 102 mg/dL — ABNORMAL HIGH (ref 40–70)

## 2016-08-06 LAB — CSF CELL COUNT WITH DIFFERENTIAL
RBC COUNT CSF: 0 /mm3
Tube #: 3
WBC CSF: 4 /mm3 (ref 0–5)

## 2016-08-06 LAB — URINE CULTURE: CULTURE: NO GROWTH

## 2016-08-06 LAB — CARBAMAZEPINE, FREE AND TOTAL
CARBAMAZEPINE FREE: 2.8 ug/mL (ref 0.6–4.2)
Carbamazepine, Total: 7.8 ug/mL (ref 4.0–12.0)

## 2016-08-06 LAB — PROTEIN, CSF: TOTAL PROTEIN, CSF: 30 mg/dL (ref 15–45)

## 2016-08-06 LAB — CARBAMAZEPINE LEVEL, TOTAL: Carbamazepine Lvl: 9.8 ug/mL (ref 4.0–12.0)

## 2016-08-06 MED ORDER — FUROSEMIDE 10 MG/ML IJ SOLN
40.0000 mg | Freq: Once | INTRAMUSCULAR | Status: AC
  Administered 2016-08-06: 40 mg via INTRAVENOUS
  Filled 2016-08-06: qty 4

## 2016-08-06 MED ORDER — LORAZEPAM 2 MG/ML IJ SOLN
1.0000 mg | Freq: Once | INTRAMUSCULAR | Status: AC
  Administered 2016-08-06: 1 mg via INTRAVENOUS
  Filled 2016-08-06: qty 1

## 2016-08-06 MED ORDER — AZITHROMYCIN 500 MG PO TABS
500.0000 mg | ORAL_TABLET | Freq: Every day | ORAL | Status: DC
  Administered 2016-08-06: 500 mg via ORAL
  Filled 2016-08-06: qty 1

## 2016-08-06 MED ORDER — LIDOCAINE HCL (PF) 1 % IJ SOLN
2.0000 mL | Freq: Once | INTRAMUSCULAR | Status: AC
  Administered 2016-08-06: 2 mL via INTRADERMAL

## 2016-08-06 MED ORDER — FAMOTIDINE 20 MG PO TABS
20.0000 mg | ORAL_TABLET | Freq: Two times a day (BID) | ORAL | Status: DC
  Administered 2016-08-06 – 2016-08-10 (×9): 20 mg via ORAL
  Filled 2016-08-06 (×9): qty 1

## 2016-08-06 NOTE — Progress Notes (Signed)
Pt going to radiology for Lumbar Puncture this am. Pt is currently still on venti-mask at 6L.  Called RRN and radiology to confirm that pt can go down to radiology on venti-mask. Radiology confirmed that pt could go down. This RN previously tried to wean pt down to [place], but pt sats 83%. Placed pt back on venti-mask at 10L, pt back up to 93%. Pt encouraged to take slower, deeper breaths. Transport at bedside to take patient down for lumbar puncture.

## 2016-08-06 NOTE — Progress Notes (Signed)
Patient will be going for LP around 9: am , they're recommending  to sedate the patient before the procedure. MD notified . No new orders noted. Next shift nurse made aware. [name]

## 2016-08-06 NOTE — Clinical Social Work Note (Signed)
Clinical Social Work Assessment  Patient Details  Name: Anna [name] MRN: [id number] Date of Birth: 23-Dec-1960  Date of referral:  08/06/16               Reason for consult:  Facility Placement, Discharge Planning                Permission sought to share information with:  Facility Sport and exercise psychologist, Family Supports Permission granted to share information::  Yes, Verbal Permission Granted  Name::     Anna [name]  Agency::  SNF's  Relationship::  Spouse  Contact Information:  [phone]  Housing/Transportation Living arrangements for the past 2 months:  [place] of Information:  Medical Team, Spouse Patient Interpreter Needed:  None Criminal Activity/Legal Involvement Pertinent to Current Situation/Hospitalization:  No - Comment as needed Significant Relationships:  Spouse Lives with:  Spouse Do you feel safe going back to the place where you live?  No Need for family participation in patient care:  Yes (Comment)  Care giving concerns:  PT recommending SNF once medically stable for discharge.   Social Worker assessment / plan:  Patient in procedure and not oriented. Patient's husband not in the room so CSW called him. CSW introduced role and explained that PT recommendations would be discussed. Patient's husband agreeable to SNF but understands that patient's oxygen requirements must decrease before referral can be sent out. No further concerns. CSW encouraged patient's husband to contact CSW as needed. CSW will continue to follow patient and her husband for support and facilitate discharge to SNF once medically stable. PASARR under manual review.  Employment status:  Unemployed Forensic scientist:  Other (Comment Required) Nurse, mental health) PT Recommendations:  [place] / Referral to community resources:  [place]  Patient/Family's Response to care:  Patient oriented to self and place only. Patient's husband  agreeable to SNF placement. Patient's husband supportive and involved in patient's care. Patient's husband appreciated social work intervention.  Patient/Family's Understanding of and Emotional Response to Diagnosis, Current Treatment, and Prognosis:  Patient oriented to self and place only. Patient's husband appears to have a good understanding of the reason for current admission. Patient's husband appears happy with hospital care.  Emotional Assessment Appearance:  Appears stated age Attitude/Demeanor/Rapport:  Unable to Assess Affect (typically observed):  Unable to Assess Orientation:  Oriented to Self, Oriented to Place Alcohol / Substance use:  Never Used Psych involvement (Current and /or in the community):  No (Comment)  Discharge Needs  Concerns to be addressed:  Care Coordination Readmission within the last 30 days:  No Current discharge risk:  Cognitively Impaired, Dependent with Mobility, Other (Home alone during the day) Barriers to Discharge:  [place] Tour manager), Continued Medical Work up, [place], LCSW 08/06/2016, 10:53 AM

## 2016-08-06 NOTE — Evaluation (Signed)
Occupational Therapy Evaluation Patient Details Name: Anna [name] MRN: [id number] DOB: 02-08-1961 Today's Date: 08/06/2016    History of Present Illness 56 yo female wtih onset of hypoxia, acute encephalopathy, flat troponins and pulm edema with SOB and AKI was admitted and noted to have acute CHF.  PMHx:  stroke, Lupus, HTN, Depression, hypothyroidism, smoker.    Clinical Impression   Per husband, pt has a hx of falls and sometimes uses a walking stick. She is able to perform self care, light meal prep and laundry independently and has driven short distances recently. Pt presents with lethargy limiting activity tolerance and evaluation. Lethargy, impaired UE function, poor balance and impaired cognition interfering with pt's ability to participate in ADL and mobility. Recommending SNF for ST rehab. Will follow acutely.    Follow Up Recommendations  SNF;Supervision/Assistance - 24 hour    Equipment Recommendations   (defer to next venue)    Recommendations for Other Services       Precautions / Restrictions Precautions Precautions: Fall Precaution Comments: watch 02 sats Restrictions Weight Bearing Restrictions: No      Mobility Bed Mobility Overal bed mobility: Needs Assistance Bed Mobility: Supine to Sit;Sit to Supine     Supine to sit: Mod assist Sit to supine: Mod assist   General bed mobility comments: assist to raise trunk and for safety  Transfers Overall transfer level: Needs assistance Equipment used: 1 person hand held assist Transfers: Sit to/from Stand Sit to Stand: Mod assist         General transfer comment: assist to rise and steady, recommend 2 person for ambulation     Balance Overall balance assessment: History of Falls;Needs assistance   Sitting balance-Leahy Scale: Fair Sitting balance - Comments: static sitting     Standing balance-Leahy Scale: Poor                              ADL Overall ADL's : Needs  assistance/impaired Eating/Feeding: Bed level;Moderate assistance   Grooming: Wash/dry hands;Sitting;Moderate assistance   Upper Body Bathing: Maximal assistance;Sitting   Lower Body Bathing: Maximal assistance;Sit to/from stand   Upper Body Dressing : Maximal assistance;Sitting   Lower Body Dressing: Maximal assistance;Sit to/from stand                 General ADL Comments: Pt with decreased thoroughness and poor task persistence.     Vision Additional Comments: difficult to assess due to lethargy   Perception     Praxis      Pertinent Vitals/Pain Pain Assessment: No/denies pain     Hand Dominance Right   Extremity/Trunk Assessment Upper Extremity Assessment Upper Extremity Assessment: Generalized weakness (tremulous)   Lower Extremity Assessment Lower Extremity Assessment: Generalized weakness   Cervical / Trunk Assessment Cervical / Trunk Assessment: Normal   Communication Communication Communication: No difficulties   Cognition Arousal/Alertness: Lethargic Behavior During Therapy: Flat affect;Impulsive Overall Cognitive Status: Difficult to assess                 General Comments: pt oriented to self and place, following one step commands with increased time   General Comments       Exercises       Shoulder Instructions      Home Living Family/patient expects to be discharged to:: Private residence Living Arrangements: Spouse/significant other Available Help at Discharge: Family;Available PRN/intermittently  Bathroom Shower/Tub: Teacher, early years/pre: Standard     Home Equipment: Other (comment) (walking stick)          Prior Functioning/Environment Level of Independence: Needs assistance  Gait / Transfers Assistance Needed: per husband, pt would use a walking stick as needed ADL's / Homemaking Assistance Needed: pt was sponge bathing due to fear of falling in tub, dressed, toileted and  performed light meal prep and laundry. Pt was driving short distances as recently as 2 weeks ago.   Comments: husband questions if pt has been taking medications correctly, plans to set up pill box for pt        OT Problem List: Decreased strength;Decreased activity tolerance;Impaired balance (sitting and/or standing);Decreased coordination;Decreased cognition;Decreased safety awareness;Decreased knowledge of use of DME or AE;Cardiopulmonary status limiting activity;Impaired UE functional use   OT Treatment/Interventions: Self-care/ADL training;Therapeutic exercise;Therapeutic activities;Cognitive remediation/compensation;Patient/family education;Balance training;DME and/or AE instruction    OT Goals(Current goals can be found in the care plan section) Acute Rehab OT Goals Patient Stated Goal: to get up to side of bed OT Goal Formulation: With patient Time For Goal Achievement: 08/20/16 Potential to Achieve Goals: Good ADL Goals Pt Will Perform Eating: with set-up;sitting Pt Will Perform Grooming: with min assist;standing Pt Will Perform Upper Body Dressing: sitting;with supervision Pt Will Perform Lower Body Dressing: with min assist;sit to/from stand Pt Will Transfer to Toilet: with min assist;ambulating;regular height toilet Pt Will Perform Toileting - Clothing Manipulation and hygiene: with min assist;sit to/from stand Pt/caregiver will Perform Home Exercise Program: Increased strength;Both right and left upper extremity;With Supervision (AROM B UEs) Additional ADL Goal #1: Pt will attend to ADL x 5 minutes with minimal verbal cues.  OT Frequency: Min 2X/week   Barriers to D/C:            Co-evaluation              End of Session Equipment Utilized During Treatment: Gait belt;Oxygen (5L)  Activity Tolerance: Patient limited by lethargy Patient left: in bed;with call bell/phone within reach;with family/visitor present   Time: 0511-0211 OT Time Calculation (min): 20  min Charges:  OT General Charges $OT Visit: 1 Procedure OT Evaluation $OT Eval Moderate Complexity: 1 Procedure G-Codes:    Malka So 08/06/2016, 3:53 PM  [phone]

## 2016-08-06 NOTE — Progress Notes (Signed)
  Echocardiogram 2D Echocardiogram has been performed.  Jennette Dubin 08/06/2016, 1:54 PM

## 2016-08-06 NOTE — Progress Notes (Signed)
Subjective: Family reports that her thinking has been much more clear today, and that her arms are doing much better with being up to be held aloft.   Exam: Vitals:   08/06/16 0754 08/06/16 0804  BP: (!) 162/88   Pulse: 84   Resp:  (!) 26  Temp: 97.5 F (36.4 C)    Gen: In bed, NAD Resp: non-labored breathing, no acute distress Abd: soft, nt  Recently sedated with ativan.  Neuro: MS: Lethargic, she does arouse, however, and follow commands and tell me where she is. CN: Fixates and tracks across midline both directions Motor: She is able to hold her arms aloft, though still has prominent asterixis, improved from yesterday Sensory: Endorses symmetric sensation  Pertinent Labs: Lumbar puncture normal protein and WBC  Impression: 56 year old female with altered mental status, asterixis in the setting of lupus. She seems to be improving. 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 With her improvement, I think that trying the MRI again tomorrow may be beneficial to get a postcontrast study, however the images obtained have already helpful.  Recommendations: 1) MRI with contrast once able 2) continue to hold gabapentin, Seroquel is either of these medications could've been contributory. 3) we will continue to follow  Roland Rack, MD [facility] [phone]  If 7pm- 7am, please page neurology on call as listed in [place].

## 2016-08-06 NOTE — Progress Notes (Signed)
MD notified regarding EKG results. Vital signs taken and recorded. Patient able to sit in the [facility] to urinate and more alert compared during shift changed. Patient still having dyspnea with the use of accessory muscles. Patient remains on 5L on  venturi mask sat was  98%, HR 79. Patient resting quietly at this time with spouse at bedside. Will continue to monitor frequently.

## 2016-08-06 NOTE — Progress Notes (Signed)
Pt arrived back to unit from MRI with transporter. Pt was currently on RA, and transported stated that pt had taken it out. At this point her O2 sat was 77% RA. Placed pt back on 6L [place], pt came up to 85%.  At this time placed pt back on Venti-mask at 10L.  Pt sats came up to 95%.  Will leave pt on venti-mask at this time. Will monitor.

## 2016-08-06 NOTE — Procedures (Signed)
Successful LP under fluoro at L3/4.  JWatts MD

## 2016-08-06 NOTE — Progress Notes (Signed)
PT in MRI, uncooperative. Paged MD for new PRN orders. Will monitor.

## 2016-08-06 NOTE — Progress Notes (Signed)
PROGRESS NOTE    Anna [name]  TMH:[id number] DOB: 23-Oct-1960 DOA: 08/04/2016 PCP: [name], MD   Chief Complaint  Patient presents with  . Altered Mental Status  . Shortness of Breath    Brief Narrative:  HPI on 08/04/2016 by Dr. Christia Reading [name] Anna [name] is a 56 y.o. female with medical history significant for lupus, hypothyroidism, hypertension, depression, and chronic CHF who presents to the emergency department with 2 months of progressive malaise and generalized weakness with acute worsening in dyspnea and acute encephalopathy. Patient is accompanied by her husband who provides much of the history. She has reportedly been increasingly fatigued and lethargic for the past 2 months and has begun complaining of shortness of breath for the past couple days she has become increasingly altered over the past couple days and was very confused today, prompting her presentation. There has been no known head trauma, use of alcohol or illicit substances, and no fevers. Patient has been coughing, but it does not seem to be productive. No leg swelling has been noted. There has been no recent changes in the patient's medications. No vomiting or diarrhea per report of the patient's husband. Appetite has been poor and the patient has been sleeping more.  Interim history Rapid response called as patient was altered and hypoxic. Patient currently stable.  Continue to monitor mental status, neuro checks. MRI brain ordered. LP ordered.   Assessment & Plan   Acute encephalopathy  -Pt has been disoriented with alternating lethargy and agitation -Has been ongoing for a couple of months.  -Unclear etiology. Question of Seroquel OD? -Drug screen +benzo -Influenza negative -Blood and urine cultures pending  -TSH 0.056, FT4 0.64 -Vitamin B12 313 -Ammonia 22 -RPR pending -Started on solumedrol given her history of lupus  -CT head unremarkable -MRI pending -Neurology consulted and  appreciated -Gabapentin and tegretol free levels pending -Total Carbmazepine level 7.4 (WNL) -IR consulted for LP. Results pending   Acute hypoxic respiratory failure/Possible Acute CHF  -Saturating 80's on rm air initially, normalized with supplemental O2  -CXR showed CHF with interstitial edema -Patient does take lasix at home -Continue IV lasix -Monitor intake/output, daily weights  -Echocardiogram ordered -ABG shows compensation (pH 7.388, pCO2 29.4, pO2 55.2) -continue supplemental oxygen -Urine output in the past 24hour 2820cc  Possible Community Acquired Pneumonia -CXR today showed worsening B/L edema/ infiltrates -Continue azithromycin/ceftriaxone   Acute kidney injury  -Creatinine 1.58 on admission, currently 1.39 -Baseline 1.1 -Losartan held -Continue to monitor BMP closely given diuresis   Normocytic anemia  -Hemoglobin 7.7 on admission, down from 11.0 in April 2017  -Hemoglobin today 9 (no blood transfusion given) -FOBT pending -Anemia panel: Iron 9, Ferritin 75, folate 32.6, B12 313 -Given IV iron (iron dextrose)  Hypothyroid  -TSH 0.05, FT4 0.64 -Continue Synthroid    SLE  -Managed with Plaquenil and methotrexate at home; Plaquenil held in light of worsened anemia  -?Cerebitis -Continue IV solumedrol -Neurology consulted and appreciated  -MRI and LP pending   Depression  -Given possible seroquel OD, will hold for now -contiue lexapro  GERD -Continue pepcid  DVT Prophylaxis  SCDs  Code Status: Full  Family Communication: Husband at bedside  Disposition Plan: Admitted. Continue close monitoring.    Consultants Neurology Interventional radiology  Procedures  LP  Antibiotics   Anti-infectives    Start     Dose/Rate Route Frequency Ordered Stop   08/06/16 2200  azithromycin (ZITHROMAX) tablet 500 mg     500 mg Oral Daily  at bedtime 08/06/16 0839 08/11/16 2159   08/05/16 1915  cefTRIAXone (ROCEPHIN) 1 g in dextrose 5 % 50 mL IVPB      1 g 100 mL/hr over 30 Minutes Intravenous Every 24 hours 08/04/16 2209 08/11/16 1914   08/05/16 1915  azithromycin (ZITHROMAX) 500 mg in dextrose 5 % 250 mL IVPB  Status:  Discontinued     500 mg 250 mL/hr over 60 Minutes Intravenous Every 24 hours 08/04/16 2209 08/06/16 0838   08/04/16 1915  cefTRIAXone (ROCEPHIN) 1 g in dextrose 5 % 50 mL IVPB     1 g 100 mL/hr over 30 Minutes Intravenous  Once 08/04/16 1901 08/04/16 2116   08/04/16 1915  azithromycin (ZITHROMAX) 500 mg in dextrose 5 % 250 mL IVPB     500 mg 250 mL/hr over 60 Minutes Intravenous  Once 08/04/16 1901 08/04/16 2218      Subjective:   Anna [name] seen and examined today.  Patient still altered but can follow some commands and answer simple questions.   Objective:   Vitals:   08/06/16 0804 08/06/16 0845 08/06/16 0848 08/06/16 0953  BP:      Pulse:      Resp: (!) 26     Temp:      TempSrc:      SpO2: 95% (!) 83% 93% 93%  Weight:      Height:        Intake/Output Summary (Last 24 hours) at 08/06/16 1059 Last data filed at 08/06/16 0823  Gross per 24 hour  Intake             1110 ml  Output             2620 ml  Net            -1510 ml   Filed Weights   08/05/16 0645 08/06/16 0526  Weight: 90.6 kg (199 lb 12.8 oz) 86.6 kg (190 lb 14.4 oz)    Exam  General: Well developed, well nourished, NAD  HEENT: NCAT,  mucous membranes moist  Cardiovascular: S1 S2 auscultated, RRR, 1/6SEM  Respiratory: Diminished breath sounds, exp wheezing   Abdomen: Soft, nontender, nondistended, + bowel sounds  Extremities: warm dry without cyanosis clubbing. 2+ LE edema  Neuro: Awake, confused, moves all extremities.   Skin: Without rashes exudates or nodules  Psych: cannot assess   Data Reviewed: I have personally reviewed following labs and imaging studies  CBC:  Recent Labs Lab 08/04/16 1623 08/05/16 2101  WBC 13.0* 8.8  NEUTROABS 10.5*  --   HGB 7.7* 9.0*  HCT 21.3* 26.0*  MCV 88.8 83.6  PLT  426* 381   Basic Metabolic Panel:  Recent Labs Lab 08/04/16 1623 08/05/16 0405 08/05/16 2101 08/06/16 0546  NA 138 143 140 139  K 3.6 4.0 5.2* 4.1  CL 110 113* 108 107  CO2 15* 17* 18* 23  GLUCOSE 100* 109* 121* 131*  BUN 20 22* 25* 27*  CREATININE 1.58* 1.54* 1.40* 1.39*  CALCIUM 7.9* 8.4* 8.9 9.1  MG  --  2.1  --   --    GFR: Estimated Creatinine Clearance: 50.5 mL/min (by C-G formula based on SCr of 1.39 mg/dL (H)). Liver Function Tests:  Recent Labs Lab 08/04/16 1623  AST 18  ALT 11*  ALKPHOS 87  BILITOT 0.5  PROT 6.9  ALBUMIN 2.5*   No results for input(s): LIPASE, AMYLASE in the last 168 hours.  Recent Labs Lab 08/05/16 0405  AMMONIA 22  Coagulation Profile:  Recent Labs Lab 08/04/16 1623  INR 1.47   Cardiac Enzymes:  Recent Labs Lab 08/04/16 2315 08/05/16 0405 08/05/16 1102  TROPONINI 0.06* 0.03* 0.03*   BNP (last 3 results) No results for input(s): PROBNP in the last 8760 hours. HbA1C: No results for input(s): HGBA1C in the last 72 hours. CBG:  Recent Labs Lab 08/04/16 1649  GLUCAP 100*   Lipid Profile: No results for input(s): CHOL, HDL, LDLCALC, TRIG, CHOLHDL, LDLDIRECT in the last 72 hours. Thyroid Function Tests:  Recent Labs  08/04/16 2014 08/05/16 1525  TSH 0.056*  --   FREET4  --  0.64   Anemia Panel:  Recent Labs  08/04/16 2315  VITAMINB12 313  FOLATE 32.6  FERRITIN 75  TIBC 270  IRON 9*  RETICCTPCT 1.8   Urine analysis:    Component Value Date/Time   COLORURINE YELLOW 08/04/2016 2018   APPEARANCEUR HAZY (A) 08/04/2016 2018   LABSPEC 1.020 08/04/2016 2018   PHURINE 5.0 08/04/2016 2018   GLUCOSEU NEGATIVE 08/04/2016 2018   HGBUR NEGATIVE 08/04/2016 2018   BILIRUBINUR NEGATIVE 08/04/2016 2018   BILIRUBINUR n 07/20/2013 0948   KETONESUR NEGATIVE 08/04/2016 2018   PROTEINUR 100 (A) 08/04/2016 2018   UROBILINOGEN 0.2 05/11/2015 2201   NITRITE NEGATIVE 08/04/2016 2018   LEUKOCYTESUR NEGATIVE  08/04/2016 2018   Sepsis Labs: @LABRCNTIP (procalcitonin:4,lacticidven:4)  ) Recent Results (from the past 240 hour(s))  Culture, blood (Routine x 2)     Status: None (Preliminary result)   Collection Time: 08/04/16  4:33 PM  Result Value Ref Range Status   Specimen Description BLOOD LEFT HAND  Final   Special Requests IN PEDIATRIC BOTTLE  3CC  Final   Culture NO GROWTH < 24 HOURS  Final   Report Status PENDING  Incomplete  Culture, blood (Routine x 2)     Status: None (Preliminary result)   Collection Time: 08/04/16  4:57 PM  Result Value Ref Range Status   Specimen Description BLOOD LEFT ARM  Final   Special Requests BOTTLES DRAWN AEROBIC AND ANAEROBIC  5CC  Final   Culture NO GROWTH < 24 HOURS  Final   Report Status PENDING  Incomplete  Urine culture     Status: None   Collection Time: 08/04/16  8:00 PM  Result Value Ref Range Status   Specimen Description URINE, CATHETERIZED  Final   Special Requests NONE  Final   Culture NO GROWTH  Final   Report Status 08/06/2016 FINAL  Final  CSF culture     Status: None (Preliminary result)   Collection Time: 08/06/16  9:37 AM  Result Value Ref Range Status   Specimen Description CSF  Final   Special Requests TUBE 2  Final   Gram Stain CYTOSPIN SMEAR NO WBC SEEN NO ORGANISMS SEEN   Final   Culture PENDING  Incomplete   Report Status PENDING  Incomplete      Radiology Studies: Dg Chest 2 View  Result Date: 08/04/2016 CLINICAL DATA:  Altered mental status. EXAM: CHEST  2 VIEW COMPARISON:  05/11/2015. FINDINGS: Cardiomegaly with bilateral from interstitial prominence and bilateral pleural effusions consistent with congestive heart failure. Small bilateral pleural effusions cannot be excluded. No pneumothorax . Interposition of the colon under the right hemidiaphragm patch that noted. Abdominal series is suggested to exclude free intraperitoneal air. IMPRESSION: 1. Congestive heart failure bilateral from interstitial edema. 2.  Interposition of the colon under the right hemidiaphragm. Abdominal series can be obtained for further evaluation to exclude free  intraperitoneal air. Critical Value/emergent results were called by telephone at the time of interpretation on 08/04/2016 at 5:38 pm to Dr. Davonna Belling , who verbally acknowledged these results. Electronically Signed   By: Marcello Moores  Register   On: 08/04/2016 17:41   Ct Head Wo Contrast  Result Date: 08/04/2016 CLINICAL DATA:  Acute onset of slurred speech.  Initial encounter. EXAM: CT HEAD WITHOUT CONTRAST TECHNIQUE: Contiguous axial images were obtained from the base of the skull through the vertex without intravenous contrast. COMPARISON:  CT of the head performed 12/01/2012 FINDINGS: Brain: No evidence of acute infarction, hemorrhage, hydrocephalus, extra-axial collection or mass lesion/mass effect. Mild periventricular white matter change likely reflects small vessel ischemic microangiopathy. The posterior fossa, including the cerebellum, brainstem and fourth ventricle, is within normal limits. The third and lateral ventricles, and basal ganglia are unremarkable in appearance. The cerebral hemispheres are symmetric in appearance, with normal gray-white differentiation. No mass effect or midline shift is seen. Vascular: No hyperdense vessel or unexpected calcification. Skull: There is no evidence of fracture; visualized osseous structures are unremarkable in appearance. Sinuses/Orbits: The orbits are within normal limits. The paranasal sinuses and mastoid air cells are well-aerated. Other: No significant soft tissue abnormalities are seen. IMPRESSION: 1. No acute intracranial pathology seen on CT. 2. Mild small vessel ischemic microangiopathy. Electronically Signed   By: Garald Balding M.D.   On: 08/04/2016 17:25   Dg Chest Port 1 View  Result Date: 08/05/2016 CLINICAL DATA:  Ends hypoxia, tachypnea, dyspnea. EXAM: PORTABLE CHEST 1 VIEW COMPARISON:  08/05/2016 at 09:35  FINDINGS: Diffuse vascular and interstitial prominence, slightly improved from the earlier exam. Reduced central/ basilar airspace opacities. No confluent consolidation. IMPRESSION: Mild improvement. Electronically Signed   By: Andreas Newport M.D.   On: 08/05/2016 20:40   Dg Chest Port 1 View  Result Date: 08/05/2016 CLINICAL DATA:  HYPOXIA.HX HTN,LUPUS EXAM: PORTABLE CHEST - 1 VIEW COMPARISON:  08/04/2016 FINDINGS: Significant progression of bilateral interstitial edema or infiltrates. Some airspace opacities in the lung bases are slightly more conspicuous. Heart size upper limits normal for technique. No definite effusion.  No pneumothorax. Visualized bones unremarkable. IMPRESSION: 1. Worsening bilateral edema/infiltrates. Electronically Signed   By: Lucrezia Europe M.D.   On: 08/05/2016 09:54   Dg Abd 2 Views  Result Date: 08/04/2016 CLINICAL DATA:  Question pneumoperitoneum on recent chest radiography EXAM: ABDOMEN - 2 VIEW COMPARISON:  08/04/2016 FINDINGS: Supine and left lateral decubitus imaging is negative for pneumoperitoneum. There is moderate gaseous distention of the colon. IMPRESSION: No evidence of pneumoperitoneum. Electronically Signed   By: Andreas Newport M.D.   On: 08/04/2016 19:37   Dg Fluoro Guide Lumbar Puncture  Result Date: 08/06/2016 Gilford Silvius, MD     08/06/2016  9:54 AM Successful LP under fluoro at L3/4. JWatts MD    Scheduled Meds: . amLODipine  10 mg Oral Daily  . aspirin  300 mg Rectal Once  . atorvastatin  20 mg Oral q1800  . azithromycin  500 mg Oral QHS  . carbamazepine  200 mg Oral q morning - 10a   And  . carbamazepine  400 mg Oral QHS  . cefTRIAXone (ROCEPHIN)  IV  1 g Intravenous Q24H  . escitalopram  30 mg Oral Daily  . famotidine  20 mg Oral BID  . iron dextran (INFED/DEXFERRUM) infusion  500 mg Intravenous Q24H  . levothyroxine  75 mcg Oral QAC breakfast  . methylPREDNISolone (SOLU-MEDROL) injection  80 mg Intravenous Q12H  . metoprolol  succinate  50 mg Oral Daily  . sodium chloride flush  3 mL Intravenous Q12H   Continuous Infusions:   LOS: 2 days   Time Spent in minutes   30 minutes  Chun Sellen D.O. on 08/06/2016 at 10:59 AM  Between 7am to 7pm - Pager - [phone]  After 7pm go to [email or web address] - password [email or web address]  And look for the night coverage person covering for me after hours  [facility] Office  [phone]

## 2016-08-07 LAB — MISC LABCORP TEST (SEND OUT): LABCORP TEST CODE: 716811

## 2016-08-07 LAB — CBC
HCT: 26.3 % — ABNORMAL LOW (ref 36.0–46.0)
HEMOGLOBIN: 9.2 g/dL — AB (ref 12.0–15.0)
MCH: 31 pg (ref 26.0–34.0)
MCHC: 35 g/dL (ref 30.0–36.0)
MCV: 88.6 fL (ref 78.0–100.0)
Platelets: 509 10*3/uL — ABNORMAL HIGH (ref 150–400)
RBC: 2.97 MIL/uL — ABNORMAL LOW (ref 3.87–5.11)
RDW: 18.3 % — AB (ref 11.5–15.5)
WBC: 10.2 10*3/uL (ref 4.0–10.5)

## 2016-08-07 LAB — BASIC METABOLIC PANEL
Anion gap: 10 (ref 5–15)
BUN: 30 mg/dL — ABNORMAL HIGH (ref 6–20)
CALCIUM: 9.1 mg/dL (ref 8.9–10.3)
CO2: 23 mmol/L (ref 22–32)
CREATININE: 1.24 mg/dL — AB (ref 0.44–1.00)
Chloride: 108 mmol/L (ref 101–111)
GFR calc non Af Amer: 47 mL/min — ABNORMAL LOW (ref >60)
GFR, EST AFRICAN AMERICAN: 55 mL/min — AB (ref >60)
GLUCOSE: 125 mg/dL — AB (ref 65–99)
Potassium: 4 mmol/L (ref 3.5–5.1)
Sodium: 141 mmol/L (ref 135–145)

## 2016-08-07 LAB — STREP PNEUMONIAE URINARY ANTIGEN: Strep Pneumo Urinary Antigen: NEGATIVE

## 2016-08-07 LAB — CARBAMAZEPINE, FREE AND TOTAL
CARBAMAZEPINE FREE: 2.4 ug/mL (ref 0.6–4.2)
CARBAMAZEPINE, TOTAL: 8.6 ug/mL (ref 4.0–12.0)

## 2016-08-07 LAB — VDRL, CSF: VDRL Quant, CSF: NONREACTIVE

## 2016-08-07 LAB — OCCULT BLOOD X 1 CARD TO LAB, STOOL: Fecal Occult Bld: POSITIVE — AB

## 2016-08-07 MED ORDER — FUROSEMIDE 10 MG/ML IJ SOLN
20.0000 mg | Freq: Two times a day (BID) | INTRAMUSCULAR | Status: DC
  Administered 2016-08-07 – 2016-08-09 (×5): 20 mg via INTRAVENOUS
  Filled 2016-08-07 (×6): qty 2

## 2016-08-07 MED ORDER — PANTOPRAZOLE SODIUM 40 MG PO TBEC
40.0000 mg | DELAYED_RELEASE_TABLET | Freq: Every day | ORAL | Status: DC
  Administered 2016-08-07 – 2016-08-16 (×10): 40 mg via ORAL
  Filled 2016-08-07 (×10): qty 1

## 2016-08-07 MED ORDER — LEVOTHYROXINE SODIUM 75 MCG PO TABS
75.0000 ug | ORAL_TABLET | Freq: Every day | ORAL | Status: DC
  Administered 2016-08-07 – 2016-08-16 (×10): 75 ug via ORAL
  Filled 2016-08-07 (×9): qty 1

## 2016-08-07 NOTE — Progress Notes (Signed)
PROGRESS NOTE    Anna [name]  EPP:[id number] DOB: 12-Aug-1960 DOA: 08/04/2016 PCP: [name], MD   Chief Complaint  Patient presents with  . Altered Mental Status  . Shortness of Breath    Brief Narrative:  HPI on 08/04/2016 by Dr. Christia Reading Opyd Anna [name] is a 57 y.o. female with medical history significant for lupus, hypothyroidism, hypertension, depression, and chronic CHF who presents to the emergency department with 2 months of progressive malaise and generalized weakness with acute worsening in dyspnea and acute encephalopathy. Patient is accompanied by her husband who provides much of the history. She has reportedly been increasingly fatigued and lethargic for the past 2 months and has begun complaining of shortness of breath for the past couple days she has become increasingly altered over the past couple days and was very confused today, prompting her presentation. There has been no known head trauma, use of alcohol or illicit substances, and no fevers. Patient has been coughing, but it does not seem to be productive. No leg swelling has been noted. There has been no recent changes in the patient's medications. No vomiting or diarrhea per report of the patient's husband. Appetite has been poor and the patient has been sleeping more.  Interim history Rapid response called as patient was altered and hypoxic. Patient currently stable.  Continue to monitor mental status, neuro checks. MRI brain ordered. LP ordered.   Assessment & Plan   Acute/Subacute encephalopathy  -for months per sister -suspect polypharmacy related -home meds lists Tegretol-high dose, Valium, Lexapro, Hydrocodone, Robaxin, Seroquel and Gabapentin-all of these meds have sedating properties -Drug screen +benzo, infectious workup negative-Influenza negative, Blood/Urine and CSF cultures neg -LP not suggestive of infection or inflammation with Normal protein and 4wbcs-given this I doubt cerebritis -TSH low  but free T4 normal, -Vitamin B12 313, -Ammonia 22 -RPR pending, CSF VDRL pending -CT head unremarkable, MRI pending -Neurology consulted and appreciated, mentation improving -CBZ levels normal -will ask for PT eval -cut down sedating meds   Acute diastolic CHF  -CXR showed CHF with interstitial edema and clinically evidence of vol overload -? Iatrogenic in part, continue Iv lasix today, resp status improving -ECHO with normal EF and garde 2 diastolic dysfunction -bmet in am  Possible Community Acquired Pneumonia -doubt this, stop Abx and monitor  Acute kidney injury  -Creatinine 1.58 on admission, currently 1.39 -Baseline 1.1 -Losartan held -monitor with diuresis   Normocytic anemia  -Hemoglobin 7.7 on admission, down from 11.0 in April 2017  -Hemoglobin now in 9 range(no blood transfusion given) -Anemia panel: Iron 9, Ferritin 75, folate 32.6, B12 313 -Given IV iron (iron dextrose), will need iron at discharge and workup as outpatient  Hypothyroid  -TSH 0.05, FT4 0.64 -Continue Synthroid    SLE  -Managed with Plaquenil and methotrexate at home; Plaquenil held in light of worsened anemia  -do not suspect cerebritis with normal LP  Depression  -stopped seroquel -contiue lexapro  GERD -Continue pepcid  DVT Prophylaxis  SCDs  Code Status: Full  Family Communication: sister at bedside Disposition Plan: Admitted, may need SNF    Consultants Neurology Interventional radiology  Procedures  LP  Antibiotics   Anti-infectives    Start     Dose/Rate Route Frequency Ordered Stop   08/06/16 2200  azithromycin (ZITHROMAX) tablet 500 mg     500 mg Oral Daily at bedtime 08/06/16 0839 08/11/16 2159   08/05/16 1915  cefTRIAXone (ROCEPHIN) 1 g in dextrose 5 % 50 mL IVPB  1 g 100 mL/hr over 30 Minutes Intravenous Every 24 hours 08/04/16 2209 08/11/16 1914   08/05/16 1915  azithromycin (ZITHROMAX) 500 mg in dextrose 5 % 250 mL IVPB  Status:  Discontinued      500 mg 250 mL/hr over 60 Minutes Intravenous Every 24 hours 08/04/16 2209 08/06/16 0838   08/04/16 1915  cefTRIAXone (ROCEPHIN) 1 g in dextrose 5 % 50 mL IVPB     1 g 100 mL/hr over 30 Minutes Intravenous  Once 08/04/16 1901 08/04/16 2116   08/04/16 1915  azithromycin (ZITHROMAX) 500 mg in dextrose 5 % 250 mL IVPB     500 mg 250 mL/hr over 60 Minutes Intravenous  Once 08/04/16 1901 08/04/16 2218      Subjective:   Per family she is more awake and alert than last 2days  Objective:   Vitals:   08/07/16 0312 08/07/16 0606 08/07/16 0924 08/07/16 0925  BP:  (!) 166/90    Pulse: 73 80    Resp:      Temp:  98.9 F (37.2 C)    TempSrc:  Oral    SpO2: 92% 96% (!) 85% 96%  Weight:  87.1 kg (192 lb 1.6 oz)    Height:        Intake/Output Summary (Last 24 hours) at 08/07/16 1210 Last data filed at 08/07/16 1151  Gross per 24 hour  Intake              960 ml  Output             1550 ml  Net             -590 ml   Filed Weights   08/05/16 0645 08/06/16 0526 08/07/16 0606  Weight: 90.6 kg (199 lb 12.8 oz) 86.6 kg (190 lb 14.4 oz) 87.1 kg (192 lb 1.6 oz)    Exam  General: Alert, awake, somewhat spaced out, some lag in answering questions  HEENT: NCAT,  mucous membranes moist  Cardiovascular: S1 S2 auscultated, RRR, 1/6SEM  Respiratory: Diminished breath sounds, few crackles  Abdomen: Soft, nontender, nondistended, + bowel sounds  Extremities: warm dry without cyanosis clubbing. 1+ LE edema  Neuro: Awake, not alert, oriented to self and place only  Skin: Without rashes exudates or nodules  Psych: cannot assess   Data Reviewed: I have personally reviewed following labs and imaging studies  CBC:  Recent Labs Lab 08/04/16 1623 08/05/16 2101 08/07/16 0521  WBC 13.0* 8.8 10.2  NEUTROABS 10.5*  --   --   HGB 7.7* 9.0* 9.2*  HCT 21.3* 26.0* 26.3*  MCV 88.8 83.6 88.6  PLT 426* 336 354*   Basic Metabolic Panel:  Recent Labs Lab 08/04/16 1623 08/05/16 0405  08/05/16 2101 08/06/16 0546 08/07/16 0521  NA 138 143 140 139 141  K 3.6 4.0 5.2* 4.1 4.0  CL 110 113* 108 107 108  CO2 15* 17* 18* 23 23  GLUCOSE 100* 109* 121* 131* 125*  BUN 20 22* 25* 27* 30*  CREATININE 1.58* 1.54* 1.40* 1.39* 1.24*  CALCIUM 7.9* 8.4* 8.9 9.1 9.1  MG  --  2.1  --   --   --    GFR: Estimated Creatinine Clearance: 56.7 mL/min (by C-G formula based on SCr of 1.24 mg/dL (H)). Liver Function Tests:  Recent Labs Lab 08/04/16 1623  AST 18  ALT 11*  ALKPHOS 87  BILITOT 0.5  PROT 6.9  ALBUMIN 2.5*   No results for input(s): LIPASE, AMYLASE in the  last 168 hours.  Recent Labs Lab 08/05/16 0405  AMMONIA 22   Coagulation Profile:  Recent Labs Lab 08/04/16 1623  INR 1.47   Cardiac Enzymes:  Recent Labs Lab 08/04/16 2315 08/05/16 0405 08/05/16 1102  TROPONINI 0.06* 0.03* 0.03*   BNP (last 3 results) No results for input(s): PROBNP in the last 8760 hours. HbA1C: No results for input(s): HGBA1C in the last 72 hours. CBG:  Recent Labs Lab 08/04/16 1649  GLUCAP 100*   Lipid Profile: No results for input(s): CHOL, HDL, LDLCALC, TRIG, CHOLHDL, LDLDIRECT in the last 72 hours. Thyroid Function Tests:  Recent Labs  08/04/16 2014 08/05/16 1525  TSH 0.056*  --   FREET4  --  0.64   Anemia Panel:  Recent Labs  08/04/16 2315  VITAMINB12 313  FOLATE 32.6  FERRITIN 75  TIBC 270  IRON 9*  RETICCTPCT 1.8   Urine analysis:    Component Value Date/Time   COLORURINE YELLOW 08/04/2016 2018   APPEARANCEUR HAZY (A) 08/04/2016 2018   LABSPEC 1.020 08/04/2016 2018   PHURINE 5.0 08/04/2016 2018   GLUCOSEU NEGATIVE 08/04/2016 2018   HGBUR NEGATIVE 08/04/2016 2018   BILIRUBINUR NEGATIVE 08/04/2016 2018   BILIRUBINUR n 07/20/2013 0948   KETONESUR NEGATIVE 08/04/2016 2018   PROTEINUR 100 (A) 08/04/2016 2018   UROBILINOGEN 0.2 05/11/2015 2201   NITRITE NEGATIVE 08/04/2016 2018   LEUKOCYTESUR NEGATIVE 08/04/2016 2018   Sepsis  Labs: @LABRCNTIP (procalcitonin:4,lacticidven:4)  ) Recent Results (from the past 240 hour(s))  Culture, blood (Routine x 2)     Status: None (Preliminary result)   Collection Time: 08/04/16  4:33 PM  Result Value Ref Range Status   Specimen Description BLOOD LEFT HAND  Final   Special Requests IN PEDIATRIC BOTTLE  3CC  Final   Culture NO GROWTH 2 DAYS  Final   Report Status PENDING  Incomplete  Culture, blood (Routine x 2)     Status: None (Preliminary result)   Collection Time: 08/04/16  4:57 PM  Result Value Ref Range Status   Specimen Description BLOOD LEFT ARM  Final   Special Requests BOTTLES DRAWN AEROBIC AND ANAEROBIC  5CC  Final   Culture NO GROWTH 2 DAYS  Final   Report Status PENDING  Incomplete  Urine culture     Status: None   Collection Time: 08/04/16  8:00 PM  Result Value Ref Range Status   Specimen Description URINE, CATHETERIZED  Final   Special Requests NONE  Final   Culture NO GROWTH  Final   Report Status 08/06/2016 FINAL  Final  CSF culture     Status: None (Preliminary result)   Collection Time: 08/06/16  9:37 AM  Result Value Ref Range Status   Specimen Description CSF  Final   Special Requests TUBE 2  Final   Gram Stain CYTOSPIN SMEAR NO WBC SEEN NO ORGANISMS SEEN   Final   Culture PENDING  Incomplete   Report Status PENDING  Incomplete      Radiology Studies: Mr Brain Wo Contrast  Result Date: 08/06/2016 CLINICAL DATA:  Altered mental status.  Lupus. EXAM: MRI HEAD WITHOUT CONTRAST TECHNIQUE: Multiplanar, multiecho pulse sequences of the brain and surrounding structures were obtained without intravenous contrast. COMPARISON:  Head CT from 2 days ago FINDINGS: Only axial diffusion and FLAIR imaging could be obtained. Axial T2 sequence was obtained but is nondiagnostic due to excessive motion. FLAIR imaging shows no edema, mass, hydrocephalus, or notable white matter disease. Normal brain volume for age. Two punctate  foci DWI hyperintensity in the  inferior cerebellum is likely artifact at the skullbase. Elsewhere negative diffusion scan. IMPRESSION: Incomplete study as described.  Obtained sequences are unremarkable. Electronically Signed   By: Monte Fantasia M.D.   On: 08/06/2016 11:28   Dg Chest Port 1 View  Result Date: 08/05/2016 CLINICAL DATA:  Ends hypoxia, tachypnea, dyspnea. EXAM: PORTABLE CHEST 1 VIEW COMPARISON:  08/05/2016 at 09:35 FINDINGS: Diffuse vascular and interstitial prominence, slightly improved from the earlier exam. Reduced central/ basilar airspace opacities. No confluent consolidation. IMPRESSION: Mild improvement. Electronically Signed   By: Andreas Newport M.D.   On: 08/05/2016 20:40   Dg Fluoro Guide Lumbar Puncture  Result Date: 08/06/2016 Gilford Silvius, MD     08/06/2016  9:54 AM Successful LP under fluoro at L3/4. JWatts MD    Scheduled Meds: . amLODipine  10 mg Oral Daily  . aspirin  300 mg Rectal Once  . atorvastatin  20 mg Oral q1800  . azithromycin  500 mg Oral QHS  . carbamazepine  200 mg Oral q morning - 10a   And  . carbamazepine  400 mg Oral QHS  . cefTRIAXone (ROCEPHIN)  IV  1 g Intravenous Q24H  . escitalopram  30 mg Oral Daily  . famotidine  20 mg Oral BID  . furosemide  20 mg Intravenous Q12H  . levothyroxine  75 mcg Oral QAC breakfast  . methylPREDNISolone (SOLU-MEDROL) injection  80 mg Intravenous Q12H  . metoprolol succinate  50 mg Oral Daily  . pantoprazole  40 mg Oral Q1200  . sodium chloride flush  3 mL Intravenous Q12H   Continuous Infusions:   LOS: 3 days   Time Spent in minutes   30 minutes  Adalynd Donahoe MD. on 08/07/2016 at 12:10 PM  Between 7am to 7pm - Pager - [phone]  After 7pm go to [email or web address] - password [email or web address]  And look for the night coverage person covering for me after hours  [facility] Office  [phone]

## 2016-08-07 NOTE — Progress Notes (Signed)
Subjective: Family reports that her thinking continues to improve.   Exam: Vitals:   08/07/16 0606 08/07/16 1239  BP: (!) 166/90 (!) 151/87  Pulse: 80 72  Resp:  18  Temp: 98.9 F (37.2 C) 98.8 F (37.1 C)   Gen: In bed, NAD Resp: non-labored breathing, no acute distress Abd: soft, nt   Neuro: MS: Sleepy but arousable, she is oriented to place, but not month. CN: Fixates and tracks across midline both directions, she is able to count fingers in all visual fields which is an improvement from admission Motor: She is able to hold her arms aloft, though still has prominent asterixis Sensory: Endorses symmetric sensation  Pertinent Labs: Lumbar puncture normal protein and WBC  Impression: 56 year old female with altered mental status, asterixis in the setting of lupus. She seems to be improving. 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 Also possible be multifactorial delirium in the setting of CHF. As long as she is continuing to improve, I don't think I would do further workup other than as outlined below.  Recommendations: 1) MRI with contrast once able 2) continue to hold gabapentin, Seroquel as either of these medications could've been contributory. 3) we will continue to follow  Roland Rack, MD [facility] [phone]  If 7pm- 7am, please page neurology on call as listed in [place].

## 2016-08-07 NOTE — Progress Notes (Signed)
Patient still a little drowsy and slow to respond.Frequently taking off O2,and  desats to 85% on room air, husband is at bedside.

## 2016-08-07 NOTE — Clinical Social Work Placement (Signed)
   CLINICAL SOCIAL WORK PLACEMENT  NOTE  Date:  08/07/2016  Patient Details  Name: Anna [name] MRN: [id number] Date of Birth: Apr 21, 1961  Clinical Social Work is seeking post-discharge placement for this patient at the [place] level of care (*CSW will initial, date and re-position this form in  chart as items are completed):  Yes   Patient/family provided with [place] Work Department's list of facilities offering this level of care within the geographic area requested by the patient (or if unable, by the patient's family).  Yes   Patient/family informed of their freedom to choose among providers that offer the needed level of care, that participate in Medicare, Medicaid or managed care program needed by the patient, have an available bed and are willing to accept the patient.  Yes   Patient/family informed of [place]'s ownership interest in [facility] and [facility], as well as of the fact that they are under no obligation to receive care at these facilities.  PASRR submitted to EDS on 08/06/16     PASRR number received on       Existing PASRR number confirmed on       FL2 transmitted to all facilities in geographic area requested by pt/family on 08/07/16     FL2 transmitted to all facilities within larger geographic area on       Patient informed that his/her managed care company has contracts with or will negotiate with certain facilities, including the following:            Patient/family informed of bed offers received.  Patient chooses bed at       Physician recommends and patient chooses bed at      Patient to be transferred to   on  .  Patient to be transferred to facility by       Patient family notified on   of transfer.  Name of family member notified:        PHYSICIAN Please sign FL2     Additional Comment:    _______________________________________________ Candie Chroman, LCSW 08/07/2016, 1:45 PM

## 2016-08-07 NOTE — Clinical Social Work Note (Addendum)
Patient is now fully oriented and has decreased oxygen to 4 L nasal canula. CSW met with patient and sister at bedside to discuss SNF placement. Patient began to cry when discussing SNF but her sister talked with her about her and the patient's husband's fears about falling when home alone. They are not able to care for her 24/7. Patient eventually agreed to SNF placement. SNF list provided for them to review.  Dayton Scrape, CSW [phone]  4:16 pm CSW provided bed offers to patient and her husband. They have chosen [facility]. [place] admissions coordinator notified and they will start insurance authorization in the morning. 30-day note on the front of patient's chart for MD to sign for PASARR.  Dayton Scrape, [place]

## 2016-08-07 NOTE — NC FL2 (Signed)
[place] LEVEL OF CARE SCREENING TOOL     IDENTIFICATION  Patient Name: Anna [name] Birthdate: 04-05-1961 Sex: female Admission Date (Current Location): 08/04/2016  [facility] and [place] Number:  Herbalist and Address:  [facility], [place] [address], [place], [place] [address]      Provider Number: [id number]  Attending Physician Name and Address:  Domenic Polite, MD  Relative Name and Phone Number:       Current Level of Care: Hospital Recommended Level of Care: [place] Prior Approval Number:    Date Approved/Denied:   PASRR Number: Pending  Discharge Plan: SNF    Current Diagnoses: Patient Active Problem List   Diagnosis Date Noted  . History of lupus   . Altered mental status 08/04/2016  . AKI (acute kidney injury) ([place]) 08/04/2016  . Acute CHF (congestive heart failure) ([place]) 08/04/2016  . Anemia 08/04/2016  . Acute respiratory failure with hypoxia ([place]) 08/04/2016  . Acute encephalopathy 08/04/2016  . Migraine without aura 12/09/2012  . Unspecified constipation 12/08/2012  . Unspecified hypothyroidism 12/08/2012  . GERD (gastroesophageal reflux disease) 12/08/2012  . Multinodular goiter 08/20/2011  . Essential hypertension, benign 12/25/2010  . SLE (systemic lupus erythematosus) ([place]) 11/27/2010  . Depression 11/27/2010  . Hyperlipidemia 11/27/2010  . Hypothyroid 11/27/2010  . Encounter for long-term (current) use of other medications 11/27/2010    Orientation RESPIRATION BLADDER Height & Weight     Self, Time, Situation, Place  O2 (Nasal canula 4 L) Continent Weight: 192 lb 1.6 oz (87.1 kg) (b scale) Height:  5\' 7"  (170.2 cm)  BEHAVIORAL SYMPTOMS/MOOD NEUROLOGICAL BOWEL NUTRITION STATUS   (None)  (None) Continent Diet (Heart healthy. Fluid restriction: 1500 mL)  AMBULATORY STATUS COMMUNICATION OF NEEDS Skin   Extensive Assist Verbally Normal                       Personal Care  Assistance Level of Assistance  Bathing, Feeding, Dressing Bathing Assistance: Maximum assistance Feeding assistance: Limited assistance Dressing Assistance: Maximum assistance     Functional Limitations Info  Sight, Hearing, Speech Sight Info: Adequate Hearing Info: Adequate Speech Info: Adequate    SPECIAL CARE FACTORS FREQUENCY  PT (By licensed PT), Blood pressure, OT (By licensed OT)     PT Frequency: 5 x week OT Frequency: 5 x week            Contractures Contractures Info: Not present    Additional Factors Info  Code Status, Allergies, Psychotropic Code Status Info: Full Allergies Info: NKDA Psychotropic Info: Depression: Lexapro 30 mg PO daily         Current Medications (08/07/2016):  This is the current hospital active medication list Current Facility-Administered Medications  Medication Dose Route Frequency Provider Last Rate Last Dose  . 0.9 %  sodium chloride infusion  250 mL Intravenous PRN Vianne Bulls, MD      . acetaminophen (TYLENOL) tablet 650 mg  650 mg Oral Q4H PRN Ilene Qua Opyd, MD      . amLODipine (NORVASC) tablet 10 mg  10 mg Oral Daily Vianne Bulls, MD   10 mg at 08/07/16 0939  . aspirin suppository 300 mg  300 mg Rectal Once Ilene Qua Opyd, MD      . atorvastatin (LIPITOR) tablet 20 mg  20 mg Oral q1800 Vianne Bulls, MD   20 mg at 08/06/16 1800  . carbamazepine (TEGRETOL) tablet 200 mg  200  mg Oral q morning - 10a Ilene Qua Opyd, MD   200 mg at 08/07/16 0940   And  . carbamazepine (TEGRETOL) tablet 400 mg  400 mg Oral QHS Vianne Bulls, MD   400 mg at 08/06/16 2246  . escitalopram (LEXAPRO) tablet 30 mg  30 mg Oral Daily Vianne Bulls, MD   30 mg at 08/07/16 0939  . famotidine (PEPCID) tablet 20 mg  20 mg Oral BID Valeda Malm Rumbarger, RPH   20 mg at 08/07/16 0940  . furosemide (LASIX) injection 20 mg  20 mg Intravenous Q12H Domenic Polite, MD   20 mg at 08/07/16 1046  . levothyroxine (SYNTHROID, LEVOTHROID) tablet 75 mcg  75 mcg Oral QAC  breakfast Maryann Mikhail, DO   75 mcg at 08/07/16 2683  . metoprolol succinate (TOPROL-XL) 24 hr tablet 50 mg  50 mg Oral Daily Vianne Bulls, MD   50 mg at 08/07/16 0940  . ondansetron (ZOFRAN) injection 4 mg  4 mg Intravenous Q6H PRN Ilene Qua Opyd, MD      . pantoprazole (PROTONIX) EC tablet 40 mg  40 mg Oral Q1200 Domenic Polite, MD   40 mg at 08/07/16 1046  . sodium chloride flush (NS) 0.9 % injection 3 mL  3 mL Intravenous Q12H Vianne Bulls, MD   3 mL at 08/07/16 0940  . sodium chloride flush (NS) 0.9 % injection 3 mL  3 mL Intravenous PRN Vianne Bulls, MD         Discharge Medications: Please see discharge summary for a list of discharge medications.  Relevant Imaging Results:  Relevant Lab Results:   Additional Information SS#: [id number]  Candie Chroman, LCSW

## 2016-08-08 DIAGNOSIS — Z79899 Other long term (current) drug therapy: Secondary | ICD-10-CM

## 2016-08-08 LAB — CBC WITH DIFFERENTIAL/PLATELET
Basophils Absolute: 0 10*3/uL (ref 0.0–0.1)
Basophils Relative: 0 %
EOS PCT: 1 %
Eosinophils Absolute: 0.1 10*3/uL (ref 0.0–0.7)
HCT: 29.9 % — ABNORMAL LOW (ref 36.0–46.0)
HEMOGLOBIN: 10.5 g/dL — AB (ref 12.0–15.0)
LYMPHS PCT: 23 %
Lymphs Abs: 2.5 10*3/uL (ref 0.7–4.0)
MCH: 31.2 pg (ref 26.0–34.0)
MCHC: 35.1 g/dL (ref 30.0–36.0)
MCV: 88.7 fL (ref 78.0–100.0)
MONOS PCT: 13 %
Monocytes Absolute: 1.4 10*3/uL — ABNORMAL HIGH (ref 0.1–1.0)
Neutro Abs: 6.9 10*3/uL (ref 1.7–7.7)
Neutrophils Relative %: 63 %
PLATELETS: 556 10*3/uL — AB (ref 150–400)
RBC: 3.37 MIL/uL — AB (ref 3.87–5.11)
RDW: 18.8 % — ABNORMAL HIGH (ref 11.5–15.5)
WBC: 10.9 10*3/uL — ABNORMAL HIGH (ref 4.0–10.5)
nRBC: 1 /100 WBC — ABNORMAL HIGH

## 2016-08-08 LAB — BASIC METABOLIC PANEL
ANION GAP: 12 (ref 5–15)
BUN: 25 mg/dL — ABNORMAL HIGH (ref 6–20)
CHLORIDE: 103 mmol/L (ref 101–111)
CO2: 25 mmol/L (ref 22–32)
Calcium: 9 mg/dL (ref 8.9–10.3)
Creatinine, Ser: 1.11 mg/dL — ABNORMAL HIGH (ref 0.44–1.00)
GFR calc Af Amer: >60 mL/min (ref >60)
GFR, EST NON AFRICAN AMERICAN: 54 mL/min — AB (ref >60)
GLUCOSE: 101 mg/dL — AB (ref 65–99)
POTASSIUM: 3.4 mmol/L — AB (ref 3.5–5.1)
Sodium: 140 mmol/L (ref 135–145)

## 2016-08-08 MED ORDER — POTASSIUM CHLORIDE CRYS ER 20 MEQ PO TBCR
40.0000 meq | EXTENDED_RELEASE_TABLET | Freq: Once | ORAL | Status: AC
  Administered 2016-08-08: 40 meq via ORAL
  Filled 2016-08-08: qty 2

## 2016-08-08 NOTE — Progress Notes (Signed)
Subjective: Much better   Exam: Vitals:   08/07/16 2037 08/08/16 0300  BP: (!) 174/100 (!) 146/74  Pulse: 80 75  Resp: 18   Temp: 98.8 F (37.1 C)    Gen: In bed, NAD Resp: non-labored breathing, no acute distress Abd: soft, nt   Neuro: MS: Awake, alert oriented to month and place today.  CN: Fixates and tracks across midline both directions, she is able to count fingers in all visual fields Motor: She is able to hold her arms aloft, with much diminished asterixis.  Sensory: Endorses symmetric sensation  Pertinent Labs: Lumbar puncture normal protein and WBC  Impression: 56 year old female with altered mental status, asterixis in the setting of lupus.  Without any evidence of CNS inflammation either by lumbar puncture or imaging, it does make me think that this is more likely to be medication induced then CNS lupus. Also the asterixis is strongly suggestive of toxic/metabolic etiology. I suspect polypharmay, possibly seroquel overdose. Gabapentin would also be a likely culprit, but this was not detected. With her continuing to markedly improve, I do not think that further testing is indicated.    Recommendations: 1) May need to touch base with her psychiatrist regarding lowering dose of seroquel.  2) No further recommendations at this time. Please call with further questions or concerns.  Roland Rack, MD [facility] [phone]  If 7pm- 7am, please page neurology on call as listed in [place].

## 2016-08-08 NOTE — Progress Notes (Signed)
PROGRESS NOTE    Anna [name]  QPY:[id number] DOB: July 30, 1960 DOA: 08/04/2016 PCP: [name], MD    Brief Narrative:  HPI on 08/04/2016 by Dr. Guinevere Scarlet Thompsonis a 56 y.o.femalewith medical history significant for lupus, hypothyroidism, hypertension, depression, and chronic CHF who presents to the emergency department with 2 months of progressive malaise and generalized weakness with acute worsening in dyspnea and acute encephalopathy. Patient is accompanied by her husband who provides much of the history. She has reportedly been increasingly fatigued and lethargic for the past 2 months and has begun complaining of shortness of breath for the past couple days she has become increasingly altered over the past couple days and was very confused today, prompting her presentation. There has been no known head trauma, use of alcohol or illicit substances, and no fevers. Patient has been coughing, but it does not seem to be productive. No leg swelling has been noted. There has been no recent changes in the patient's medications. No vomiting or diarrhea per report of the patient's husband. Appetite has been poor and the patient has been sleeping more.  Interim history Rapid response called as patient was altered and hypoxic. Patient currently stable.  Continue to monitor mental status, neuro checks. MRI brain ordered. LP ordered.    Assessment & Plan:   Principal Problem:   Acute CHF (congestive heart failure) (HCC) Active Problems:   SLE (systemic lupus erythematosus) (HCC)   Depression   Hypothyroid   Essential hypertension, benign   GERD (gastroesophageal reflux disease)   Altered mental status   AKI (acute kidney injury) ([place])   Anemia   Acute respiratory failure with hypoxia (HCC)   Acute encephalopathy  Acute/Subacute encephalopathy  -for months per sister -suspect polypharmacy related -home meds lists Tegretol-high dose, Valium, Lexapro, Hydrocodone, Robaxin,  Seroquel and Gabapentin-all of these meds have sedating properties -Drug screen +benzo, infectious workup negative-Influenza negative, Blood/Urine and CSF cultures neg -LP not suggestive of infection or inflammation with Normal protein and 4wbcs-given this I doubt cerebritis -TSH low but free T4 normal, -Vitamin B12 313, -Ammonia 22 -RPR pending, CSF VDRL nonreactive -CT head unremarkable, MRI with a incomplete study however sequences obtained were unremarkable. -Neurology consulted and appreciated, mentation improving -CBZ levels normal -will ask for PT eval -cut down sedating meds -Per neurology no further workup needed at this time.   Acute diastolic CHF  -CXR showed CHF with interstitial edema and clinically evidence of vol overload - Patient also noted to be hypoxic on room air per nursing. -? Iatrogenic in part, continue Iv lasix today, resp status improving -Patient with a urine output of 2.5 L over the past 24 hours. Patient is -4.316 L during this hospitalization. Monitor hypoxia. -ECHO with normal EF and garde 2 diastolic dysfunction -bmet in am  Possible Community Acquired Pneumonia -doubt this, stopped Abx and monitor  Acute kidney injury  -Creatinine 1.58 on admission, currently 1. 11 and at baseline. -Baseline 1.1 -Losartan held -monitor with diuresis  -Follow.  Normocytic anemia  -Hemoglobin 7.7 on admission, down from 11.0 in April 2017  -Hemoglobin now at 10.5 range(no blood transfusion given) -Anemia panel: Iron 9, Ferritin 75, folate 32.6, B12 313 -Given IV iron (iron dextrose), will need iron at discharge and workup as outpatient  Hypothyroid  -TSH 0.05, FT4 0.64 -Continue Synthroid   SLE  -Managed with Plaquenil and methotrexate at home; Plaquenil held in light of worsened anemia  -do not suspect cerebritis with normal LP -Empiric steroids have  been discontinued.  Depression  -stopped seroquel secondary to acute encephalopathy with clinical  improvement. Patient will need to follow-up with psychiatrist and outpatient setting for further recommendations. -contiue lexapro  GERD -Continue pepcid   DVT prophylaxis: SCDs Code Status: Full Family Communication: Updated patient and family at bedside. Disposition Plan: Skilled nursing facility once on oral diuretics with resolution of hypoxia.   Consultants:   Neurology: Dr. Leonel Ramsay 08/05/2016  Procedures:   CT abdominal/pelvis 08/04/2016  Chest x-ray 08/04/2016, 08/05/2016  Abdominal x-rays 08/04/2016  MRI brain 08/06/2016  2-D echo 08/06/2016  Fluoroscopy guided lumbar puncture 08/06/2016 per Dr Pascal Lux.  Antimicrobials:   Azithromycin 08/04/2016>>>> 08/06/2016  IV Rocephin 08/04/2016>>>> to 1 2018   Subjective: Patient alert and oriented and following commands. Sitting up at bedside. Per nursing patient noted to be hypoxic on room air.  Objective: Vitals:   08/07/16 2037 08/08/16 0300 08/08/16 0343 08/08/16 1227  BP: (!) 174/100 (!) 146/74  130/76  Pulse: 80 75  88  Resp: 18   18  Temp: 98.8 F (37.1 C)   99.2 F (37.3 C)  TempSrc: Oral   Oral  SpO2: 93% 93%  95%  Weight:   86.8 kg (191 lb 6.4 oz)   Height:        Intake/Output Summary (Last 24 hours) at 08/08/16 1246 Last data filed at 08/08/16 1223  Gross per 24 hour  Intake             1200 ml  Output             2100 ml  Net             -900 ml   Filed Weights   08/06/16 0526 08/07/16 0606 08/08/16 0343  Weight: 86.6 kg (190 lb 14.4 oz) 87.1 kg (192 lb 1.6 oz) 86.8 kg (191 lb 6.4 oz)    Examination:  General exam: Appears calm and comfortable  Respiratory system: Decreased breath sounds in the bases. Respiratory effort normal. Cardiovascular system: S1 & S2 heard, RRR. No JVD, murmurs, rubs, gallops or clicks. No pedal edema. Gastrointestinal system: Abdomen is nondistended, soft and nontender. No organomegaly or masses felt. Normal bowel sounds heard. Central nervous system:  Alert and oriented. No focal neurological deficits. Extremities: Symmetric 5 x 5 power. Skin: No rashes, lesions or ulcers Psychiatry: Judgement and insight appear normal. Mood & affect appropriate.     Data Reviewed: I have personally reviewed following labs and imaging studies  CBC:  Recent Labs Lab 08/04/16 1623 08/05/16 2101 08/07/16 0521 08/08/16 1004  WBC 13.0* 8.8 10.2 10.9*  NEUTROABS 10.5*  --   --  6.9  HGB 7.7* 9.0* 9.2* 10.5*  HCT 21.3* 26.0* 26.3* 29.9*  MCV 88.8 83.6 88.6 88.7  PLT 426* 336 509* 841*   Basic Metabolic Panel:  Recent Labs Lab 08/05/16 0405 08/05/16 2101 08/06/16 0546 08/07/16 0521 08/08/16 0616  NA 143 140 139 141 140  K 4.0 5.2* 4.1 4.0 3.4*  CL 113* 108 107 108 103  CO2 17* 18* 23 23 25   GLUCOSE 109* 121* 131* 125* 101*  BUN 22* 25* 27* 30* 25*  CREATININE 1.54* 1.40* 1.39* 1.24* 1.11*  CALCIUM 8.4* 8.9 9.1 9.1 9.0  MG 2.1  --   --   --   --    GFR: Estimated Creatinine Clearance: 63.3 mL/min (by C-G formula based on SCr of 1.11 mg/dL (H)). Liver Function Tests:  Recent Labs Lab 08/04/16 1623  AST 18  ALT  11*  ALKPHOS 87  BILITOT 0.5  PROT 6.9  ALBUMIN 2.5*   No results for input(s): LIPASE, AMYLASE in the last 168 hours.  Recent Labs Lab 08/05/16 0405  AMMONIA 22   Coagulation Profile:  Recent Labs Lab 08/04/16 1623  INR 1.47   Cardiac Enzymes:  Recent Labs Lab 08/04/16 2315 08/05/16 0405 08/05/16 1102  TROPONINI 0.06* 0.03* 0.03*   BNP (last 3 results) No results for input(s): PROBNP in the last 8760 hours. HbA1C: No results for input(s): HGBA1C in the last 72 hours. CBG:  Recent Labs Lab 08/04/16 1649  GLUCAP 100*   Lipid Profile: No results for input(s): CHOL, HDL, LDLCALC, TRIG, CHOLHDL, LDLDIRECT in the last 72 hours. Thyroid Function Tests:  Recent Labs  08/05/16 1525  FREET4 0.64   Anemia Panel: No results for input(s): VITAMINB12, FOLATE, FERRITIN, TIBC, IRON, RETICCTPCT in  the last 72 hours. Sepsis Labs:  Recent Labs Lab 08/04/16 1710  LATICACIDVEN 1.06    Recent Results (from the past 240 hour(s))  Culture, blood (Routine x 2)     Status: None (Preliminary result)   Collection Time: 08/04/16  4:33 PM  Result Value Ref Range Status   Specimen Description BLOOD LEFT HAND  Final   Special Requests IN PEDIATRIC BOTTLE  3CC  Final   Culture NO GROWTH 3 DAYS  Final   Report Status PENDING  Incomplete  Culture, blood (Routine x 2)     Status: None (Preliminary result)   Collection Time: 08/04/16  4:57 PM  Result Value Ref Range Status   Specimen Description BLOOD LEFT ARM  Final   Special Requests BOTTLES DRAWN AEROBIC AND ANAEROBIC  5CC  Final   Culture NO GROWTH 3 DAYS  Final   Report Status PENDING  Incomplete  Urine culture     Status: None   Collection Time: 08/04/16  8:00 PM  Result Value Ref Range Status   Specimen Description URINE, CATHETERIZED  Final   Special Requests NONE  Final   Culture NO GROWTH  Final   Report Status 08/06/2016 FINAL  Final  CSF culture     Status: None (Preliminary result)   Collection Time: 08/06/16  9:37 AM  Result Value Ref Range Status   Specimen Description CSF  Final   Special Requests TUBE 2  Final   Gram Stain CYTOSPIN SMEAR NO WBC SEEN NO ORGANISMS SEEN   Final   Culture NO GROWTH 2 DAYS  Final   Report Status PENDING  Incomplete         Radiology Studies: No results found.      Scheduled Meds: . amLODipine  10 mg Oral Daily  . atorvastatin  20 mg Oral q1800  . carbamazepine  200 mg Oral q morning - 10a   And  . carbamazepine  400 mg Oral QHS  . escitalopram  30 mg Oral Daily  . famotidine  20 mg Oral BID  . furosemide  20 mg Intravenous Q12H  . levothyroxine  75 mcg Oral QAC breakfast  . metoprolol succinate  50 mg Oral Daily  . pantoprazole  40 mg Oral Q1200  . sodium chloride flush  3 mL Intravenous Q12H   Continuous Infusions:   LOS: 4 days    Time spent: 19  mins    Butzin,DANIEL, MD [facility] Pager [phone]  If 7PM-7AM, please contact night-coverage [email or web address] Password [facility] 08/08/2016, 12:46 PM

## 2016-08-08 NOTE — Progress Notes (Signed)
Physical Therapy Treatment Patient Details Name: Anna [name] MRN: [id number] DOB: 02-19-1961 Today's Date: 08/08/2016    History of Present Illness 56 yo female wtih onset of hypoxia, acute encephalopathy, flat troponins and pulm edema with SOB and AKI was admitted and noted to have acute CHF.  PMHx:  stroke, Lupus, HTN, Depression, hypothyroidism, smoker.     PT Comments    Pt making good progress. Continue to recommend [facility] for continued PT.  Follow Up Recommendations  SNF     Equipment Recommendations  Rolling walker with 5" wheels    Recommendations for Other Services       Precautions / Restrictions Precautions Precautions: Fall Precaution Comments: watch 02 sats Restrictions Weight Bearing Restrictions: No    Mobility  Bed Mobility Overal bed mobility: Needs Assistance Bed Mobility: Supine to Sit     Supine to sit: Min assist     General bed mobility comments: Assist to initiate movement  Transfers Overall transfer level: Needs assistance Equipment used: Rolling walker (2 wheeled) Transfers: Sit to/from Stand Sit to Stand: Min assist         General transfer comment: Assist to bring hips up and for balance  Ambulation/Gait Ambulation/Gait assistance: Min assist Ambulation Distance (Feet): 90 Feet Assistive device: Rolling walker (2 wheeled) Gait Pattern/deviations: Step-through pattern;Decreased step length - right;Decreased step length - left Gait velocity: decr Gait velocity interpretation: Below normal speed for age/gender General Gait Details: Assist for balance and support. SpO2 89% on RA with amb.   Stairs            Wheelchair Mobility    Modified Rankin (Stroke Patients Only)       Balance Overall balance assessment: History of Falls;Needs assistance Sitting-balance support: No upper extremity supported;Feet supported Sitting balance-Leahy Scale: Fair     Standing balance support: Bilateral upper extremity  supported Standing balance-Leahy Scale: Poor Standing balance comment: UE support and min guard for static standing                    Cognition Arousal/Alertness: Awake/alert Behavior During Therapy: Flat affect Overall Cognitive Status: Impaired/Different from baseline Area of Impairment: Problem solving;Following commands;Safety/judgement       Following Commands: Follows one step commands with increased time Safety/Judgement: Decreased awareness of deficits   Problem Solving: Slow processing;Requires verbal cues;Decreased initiation      Exercises      General Comments        Pertinent Vitals/Pain Pain Assessment: No/denies pain    Home Living                      Prior Function            PT Goals (current goals can now be found in the care plan section) Progress towards PT goals: Progressing toward goals    Frequency    Min 3X/week      PT Plan Current plan remains appropriate    Co-evaluation             End of Session Equipment Utilized During Treatment: Gait belt;Oxygen Activity Tolerance: Patient tolerated treatment well Patient left: in bed;with call bell/phone within reach;with family/visitor present (sitting EOB to eat lunch with sister present)     Time: 6160-7371 PT Time Calculation (min) (ACUTE ONLY): 24 min  Charges:  $Gait Training: 23-37 mins                    G Codes:  Shary Decamp [place] 08/08/2016, 1:16 PM [place]

## 2016-08-09 ENCOUNTER — Inpatient Hospital Stay (HOSPITAL_COMMUNITY): Payer: BLUE CROSS/BLUE SHIELD

## 2016-08-09 DIAGNOSIS — T50901A Poisoning by unspecified drugs, medicaments and biological substances, accidental (unintentional), initial encounter: Secondary | ICD-10-CM

## 2016-08-09 DIAGNOSIS — R6510 Systemic inflammatory response syndrome (SIRS) of non-infectious origin without acute organ dysfunction: Secondary | ICD-10-CM | POA: Diagnosis present

## 2016-08-09 DIAGNOSIS — I5031 Acute diastolic (congestive) heart failure: Secondary | ICD-10-CM | POA: Diagnosis present

## 2016-08-09 DIAGNOSIS — J189 Pneumonia, unspecified organism: Secondary | ICD-10-CM | POA: Diagnosis present

## 2016-08-09 DIAGNOSIS — E038 Other specified hypothyroidism: Secondary | ICD-10-CM

## 2016-08-09 LAB — CULTURE, BLOOD (ROUTINE X 2)
CULTURE: NO GROWTH
Culture: NO GROWTH

## 2016-08-09 LAB — BLOOD GAS, ARTERIAL
ACID-BASE EXCESS: 1.8 mmol/L (ref 0.0–2.0)
Bicarbonate: 25.4 mmol/L (ref 20.0–28.0)
DRAWN BY: 252031
Delivery systems: POSITIVE
Expiratory PAP: 5
FIO2: 100
INSPIRATORY PAP: 10
LHR: 8 {breaths}/min
O2 SAT: 97.5 %
PATIENT TEMPERATURE: 98.6
PH ART: 7.452 — AB (ref 7.350–7.450)
PO2 ART: 108 mmHg (ref 83.0–108.0)
pCO2 arterial: 37 mmHg (ref 32.0–48.0)

## 2016-08-09 LAB — CBC
HEMATOCRIT: 31.9 % — AB (ref 36.0–46.0)
HEMOGLOBIN: 10.7 g/dL — AB (ref 12.0–15.0)
MCH: 29.5 pg (ref 26.0–34.0)
MCHC: 33.5 g/dL (ref 30.0–36.0)
MCV: 87.9 fL (ref 78.0–100.0)
Platelets: 509 10*3/uL — ABNORMAL HIGH (ref 150–400)
RBC: 3.63 MIL/uL — AB (ref 3.87–5.11)
RDW: 18.7 % — ABNORMAL HIGH (ref 11.5–15.5)
WBC: 22.5 10*3/uL — AB (ref 4.0–10.5)

## 2016-08-09 LAB — BASIC METABOLIC PANEL
ANION GAP: 10 (ref 5–15)
BUN: 25 mg/dL — ABNORMAL HIGH (ref 6–20)
CHLORIDE: 103 mmol/L (ref 101–111)
CO2: 25 mmol/L (ref 22–32)
Calcium: 8.7 mg/dL — ABNORMAL LOW (ref 8.9–10.3)
Creatinine, Ser: 1.23 mg/dL — ABNORMAL HIGH (ref 0.44–1.00)
GFR calc non Af Amer: 48 mL/min — ABNORMAL LOW (ref >60)
GFR, EST AFRICAN AMERICAN: 55 mL/min — AB (ref >60)
Glucose, Bld: 142 mg/dL — ABNORMAL HIGH (ref 65–99)
POTASSIUM: 4.4 mmol/L (ref 3.5–5.1)
SODIUM: 138 mmol/L (ref 135–145)

## 2016-08-09 LAB — CSF CULTURE W GRAM STAIN: Culture: NO GROWTH

## 2016-08-09 LAB — LACTIC ACID, PLASMA: Lactic Acid, Venous: 1.4 mmol/L (ref 0.5–1.9)

## 2016-08-09 LAB — TROPONIN I: TROPONIN I: 0.05 ng/mL — AB (ref <0.03)

## 2016-08-09 LAB — CSF CULTURE

## 2016-08-09 MED ORDER — VANCOMYCIN HCL 10 G IV SOLR
1500.0000 mg | Freq: Once | INTRAVENOUS | Status: AC
  Administered 2016-08-09: 1500 mg via INTRAVENOUS
  Filled 2016-08-09: qty 1500

## 2016-08-09 MED ORDER — VANCOMYCIN HCL IN DEXTROSE 750-5 MG/150ML-% IV SOLN
750.0000 mg | Freq: Two times a day (BID) | INTRAVENOUS | Status: DC
  Administered 2016-08-10 – 2016-08-11 (×4): 750 mg via INTRAVENOUS
  Filled 2016-08-09 (×5): qty 150

## 2016-08-09 MED ORDER — FUROSEMIDE 10 MG/ML IJ SOLN
80.0000 mg | Freq: Once | INTRAMUSCULAR | Status: AC
  Administered 2016-08-09: 80 mg via INTRAVENOUS

## 2016-08-09 MED ORDER — FUROSEMIDE 10 MG/ML IJ SOLN
40.0000 mg | Freq: Three times a day (TID) | INTRAMUSCULAR | Status: DC
  Administered 2016-08-10 (×3): 40 mg via INTRAVENOUS
  Filled 2016-08-09 (×4): qty 4

## 2016-08-09 MED ORDER — CEFEPIME HCL 1 G IJ SOLR
1.0000 g | Freq: Three times a day (TID) | INTRAMUSCULAR | Status: DC
  Administered 2016-08-10 – 2016-08-13 (×11): 1 g via INTRAVENOUS
  Filled 2016-08-09 (×12): qty 1

## 2016-08-09 NOTE — Progress Notes (Signed)
PROGRESS NOTE    Anna [name]  TAV:[id number] DOB: [birth year] DOA: 08/04/2016 PCP: [name], MD     Brief Narrative:  56 y.o.BF PMHx Depression, CVA, Lupus, Hypothyroidism, HTN, Chronic CHF   who presents to the emergency department with 2 months of progressive malaise and generalized weakness with acute worsening in dyspnea and acute encephalopathy. Patient is accompanied by her husband who provides much of the history. She has reportedly been increasingly fatigued and lethargic for the past 2 months and has begun complaining of shortness of breath for the past couple days she has become increasingly altered over the past couple days and was very confused today, prompting her presentation. There has been no known head trauma, use of alcohol or illicit substances, and no fevers. Patient has been coughing, but it does not seem to be productive. No leg swelling has been noted. There has been no recent changes in the patient's medications. No vomiting or diarrhea per report of the patient's husband. Appetite has been poor and the patient has been sleeping more.  Interim history Rapid response called as patient was altered and hypoxic. Patient currently stable. Continue to monitor mental status, neuro checks. MRI brain ordered. LP ordered.    Subjective: 2/3 A/O 4, states overdosed unintentionally. Still some confusion as to what medication she is taking at home. Patient new she takes gabapentin but unsure as to why. Tmax overnight 38.2C     Assessment & Plan:   Principal Problem:   Acute CHF (congestive heart failure) (HCC) Active Problems:   SLE (systemic lupus erythematosus) (HCC)   Depression   Hypothyroid   Essential hypertension, benign   GERD (gastroesophageal reflux disease)   Altered mental status   AKI (acute kidney injury) ([place])   Anemia   Acute respiratory failure with hypoxia (HCC)   Acute encephalopathy   Long-term use of Plaquenil  SIRS -2/3 patient  meets guidelines for SIRS temp> 38C, leukocytosis> 12 K. Unsure patient meets septic guidelines yet. -PCXR pending -Blood culture, urine culture, sputum culture pending -Lactic acid pending  Acute/Subacuteencephalopathy  -suspect polypharmacy related: Patient admits to overdose on at least gabapentin -home meds lists Tegretol-high dose, Valium, Lexapro, Hydrocodone, Robaxin, Seroquel and Gabapentin-all of these meds have sedating properties -Drug screen +benzo, infectious workup negative-Influenza negative, Blood/Urine and CSF cultures neg -LP not suggestive of infection or inflammation with Normal protein and 4wbcs-given this I doubt cerebritis -TSH low but free T4 normal, -Vitamin B12 313, -Ammonia 22 -RPR pending, CSF VDRL nonreactive -CT head unremarkable, MRI with a incomplete study however sequences obtained were unremarkable. -Neurology consulted  mentation improving -PT eval: SNF -cut down sedating meds -Per neurology no further workup needed at this time.  Acute Diastolic CHF  -CXR showed CHF with interstitial edema and clinically evidence of vol overload -2/3 Hypoxia with PT -Strict in and out since admission -5.2 L -Daily weight Filed Weights   08/07/16 0606 08/08/16 0343 08/09/16 0732  Weight: 87.1 kg (192 lb 1.6 oz) 86.8 kg (191 lb 6.4 oz) 86.2 kg (190 lb)  -Lasix IV 40 mg TID  HCAP -Leukocytosis + fever + tachypnea -Although tachypnea could be secondary to patient's pleural effusion/pulmonary edema given increased WBC and fever Will aire on the side of caution and after blood/urine culture, sputum drawn start empiric antibiotics.  Pulmonary edema/Left pleural effusion -2/3 patient tachypnea PCXR shows above findings. -ABG pending -Lasix IV 80 mg, +BIPAP -If no improvement in respiratory status may require thoracentesis on 2/4  Acute kidney injury (  baseline Cr 1.1) -Creatinine 1.58 on admission -Losartan held Lab Results  Component Value Date   CREATININE  1.23 (H) 08/09/2016   CREATININE 1.11 (H) 08/08/2016   CREATININE 1.24 (H) 08/07/2016   Normocytic anemia  -Hemoglobin 7.7 on admission, down from 11.0 in April 2017  -Hemoglobin now at 10.5 range(no blood transfusion given) -Anemia panel: Iron 9, Ferritin 75, folate 32.6, B12 313 -Given IV iron (iron dextrose), will need iron at discharge and workup as outpatient  Hypothyroid  -TSH 0.05, FT4 0.64 -Continue Synthroid   SLE  -Managed with Plaquenil and methotrexate at home; Plaquenil held in light of worsened anemia  -do not suspect cerebritis with normal LP -Empiric steroids have been discontinued.  Depression  -stopped seroquel secondary to acute encephalopathy with clinical improvement. Patient will need to follow-up with psychiatrist and outpatient setting for further recommendations. -contiue lexapro  GERD -Continue pepcid   DVT prophylaxis: SCD  Code Status: Full Family Communication: Spoke with husband and sister bedside and discuss plan of care Disposition Plan: PT eval: SNF   Consultants:  Neurology: Dr. Leonel Ramsay 08/05/2016   Procedures/Significant Events:  CT abdominal/pelvis 08/04/2016 Chest x-ray 08/04/2016, 08/05/2016 Abdominal x-rays 08/04/2016 MRI brain 08/06/2016 2-D echo 08/06/2016 Fluoroscopy guided lumbar puncture 08/06/2016 per Dr Pascal Lux.    VENTILATOR SETTINGS: BiPAP   Cultures 1/29 blood 2 negative 1/29 urine negative 1/31 CSF negative 2/3 blood pending 2/3 urine pending 2/3 sputum pending   Antimicrobials: Anti-infectives    Start     Stop   08/09/16 2200  ceFEPIme (MAXIPIME) 1 g in dextrose 5 % 50 mL IVPB     08/17/16 2159   08/06/16 2200  azithromycin (ZITHROMAX) tablet 500 mg  Status:  Discontinued     08/07/16 1222   08/05/16 1915  cefTRIAXone (ROCEPHIN) 1 g in dextrose 5 % 50 mL IVPB  Status:  Discontinued     08/07/16 1222   08/05/16 1915  azithromycin (ZITHROMAX) 500 mg in dextrose 5 % 250 mL IVPB  Status:   Discontinued     08/06/16 0838   08/04/16 1915  cefTRIAXone (ROCEPHIN) 1 g in dextrose 5 % 50 mL IVPB     08/04/16 2116   08/04/16 1915  azithromycin (ZITHROMAX) 500 mg in dextrose 5 % 250 mL IVPB     08/04/16 2218       Devices   LINES / TUBES:      Continuous Infusions:   Objective: Vitals:   08/08/16 1646 08/08/16 1739 08/08/16 1957 08/09/16 0732  BP:   128/74 124/74  Pulse:   82 88  Resp:   19 19  Temp: (!) 100.8 F (38.2 C) 98.5 F (36.9 C) 98.6 F (37 C) 98.9 F (37.2 C)  TempSrc: Oral Oral Oral Oral  SpO2:   100% 91%  Weight:    86.2 kg (190 lb)  Height:        Intake/Output Summary (Last 24 hours) at 08/09/16 0817 Last data filed at 08/09/16 0757  Gross per 24 hour  Intake              924 ml  Output             2000 ml  Net            -1076 ml   Filed Weights   08/07/16 0606 08/08/16 0343 08/09/16 0732  Weight: 87.1 kg (192 lb 1.6 oz) 86.8 kg (191 lb 6.4 oz) 86.2 kg (190 lb)    Examination:  General:  A/O 4, still some confusion, positive acute respiratory distress Eyes: negative scleral hemorrhage, negative anisocoria, negative icterus ENT: Negative Runny nose, negative gingival bleeding, Neck:  Negative scars, masses, torticollis, lymphadenopathy, JVD Lungs: tachypnea, decreased/absent breath sounds right lung fields. Left lung fields clear to auscultation, Cardiovascular: Tachycardic, Regular rhythm without murmur gallop or rub normal S1 and S2 Abdomen: negative abdominal pain, nondistended, positive soft, bowel sounds, no rebound, no ascites, no appreciable mass Extremities: No significant cyanosis, clubbing, or edema bilateral lower extremities Skin: Negative rashes, lesions, ulcers Psychiatric:  Negative depression, negative anxiety, negative fatigue, negative mania  Central nervous system:  Cranial nerves II through XII intact, tongue/uvula midline, all extremities muscle strength 5/5, sensation intact throughout, negative dysarthria,  negative expressive aphasia, negative receptive aphasia.  .     Data Reviewed: Care during the described time interval was provided by me .  I have reviewed this patient's available data, including medical history, events of note, physical examination, and all test results as part of my evaluation. I have personally reviewed and interpreted all radiology studies.  CBC:  Recent Labs Lab 08/04/16 1623 08/05/16 2101 08/07/16 0521 08/08/16 1004 08/09/16 0330  WBC 13.0* 8.8 10.2 10.9* 22.5*  NEUTROABS 10.5*  --   --  6.9  --   HGB 7.7* 9.0* 9.2* 10.5* 10.7*  HCT 21.3* 26.0* 26.3* 29.9* 31.9*  MCV 88.8 83.6 88.6 88.7 87.9  PLT 426* 336 509* 556* 465*   Basic Metabolic Panel:  Recent Labs Lab 08/05/16 0405 08/05/16 2101 08/06/16 0546 08/07/16 0521 08/08/16 0616 08/09/16 0330  NA 143 140 139 141 140 138  K 4.0 5.2* 4.1 4.0 3.4* 4.4  CL 113* 108 107 108 103 103  CO2 17* 18* 23 23 25 25   GLUCOSE 109* 121* 131* 125* 101* 142*  BUN 22* 25* 27* 30* 25* 25*  CREATININE 1.54* 1.40* 1.39* 1.24* 1.11* 1.23*  CALCIUM 8.4* 8.9 9.1 9.1 9.0 8.7*  MG 2.1  --   --   --   --   --    GFR: Estimated Creatinine Clearance: 56.9 mL/min (by C-G formula based on SCr of 1.23 mg/dL (H)). Liver Function Tests:  Recent Labs Lab 08/04/16 1623  AST 18  ALT 11*  ALKPHOS 87  BILITOT 0.5  PROT 6.9  ALBUMIN 2.5*   No results for input(s): LIPASE, AMYLASE in the last 168 hours.  Recent Labs Lab 08/05/16 0405  AMMONIA 22   Coagulation Profile:  Recent Labs Lab 08/04/16 1623  INR 1.47   Cardiac Enzymes:  Recent Labs Lab 08/04/16 2315 08/05/16 0405 08/05/16 1102  TROPONINI 0.06* 0.03* 0.03*   BNP (last 3 results) No results for input(s): PROBNP in the last 8760 hours. HbA1C: No results for input(s): HGBA1C in the last 72 hours. CBG:  Recent Labs Lab 08/04/16 1649  GLUCAP 100*   Lipid Profile: No results for input(s): CHOL, HDL, LDLCALC, TRIG, CHOLHDL, LDLDIRECT in the  last 72 hours. Thyroid Function Tests: No results for input(s): TSH, T4TOTAL, FREET4, T3FREE, THYROIDAB in the last 72 hours. Anemia Panel: No results for input(s): VITAMINB12, FOLATE, FERRITIN, TIBC, IRON, RETICCTPCT in the last 72 hours. Sepsis Labs:  Recent Labs Lab 08/04/16 1710  LATICACIDVEN 1.06    Recent Results (from the past 240 hour(s))  Culture, blood (Routine x 2)     Status: None (Preliminary result)   Collection Time: 08/04/16  4:33 PM  Result Value Ref Range Status   Specimen Description BLOOD LEFT HAND  Final  Special Requests IN PEDIATRIC BOTTLE  3CC  Final   Culture NO GROWTH 4 DAYS  Final   Report Status PENDING  Incomplete  Culture, blood (Routine x 2)     Status: None (Preliminary result)   Collection Time: 08/04/16  4:57 PM  Result Value Ref Range Status   Specimen Description BLOOD LEFT ARM  Final   Special Requests BOTTLES DRAWN AEROBIC AND ANAEROBIC  5CC  Final   Culture NO GROWTH 4 DAYS  Final   Report Status PENDING  Incomplete  Urine culture     Status: None   Collection Time: 08/04/16  8:00 PM  Result Value Ref Range Status   Specimen Description URINE, CATHETERIZED  Final   Special Requests NONE  Final   Culture NO GROWTH  Final   Report Status 08/06/2016 FINAL  Final  CSF culture     Status: None (Preliminary result)   Collection Time: 08/06/16  9:37 AM  Result Value Ref Range Status   Specimen Description CSF  Final   Special Requests TUBE 2  Final   Gram Stain CYTOSPIN SMEAR NO WBC SEEN NO ORGANISMS SEEN   Final   Culture NO GROWTH 2 DAYS  Final   Report Status PENDING  Incomplete         Radiology Studies: No results found.      Scheduled Meds: . amLODipine  10 mg Oral Daily  . atorvastatin  20 mg Oral q1800  . carbamazepine  200 mg Oral q morning - 10a   And  . carbamazepine  400 mg Oral QHS  . escitalopram  30 mg Oral Daily  . famotidine  20 mg Oral BID  . furosemide  20 mg Intravenous Q12H  . levothyroxine  75  mcg Oral QAC breakfast  . metoprolol succinate  50 mg Oral Daily  . pantoprazole  40 mg Oral Q1200  . sodium chloride flush  3 mL Intravenous Q12H   Continuous Infusions:   LOS: 5 days    Time spent:40 min    Gloriajean Okun, Geraldo Docker, MD [facility] Pager [phone]  If 7PM-7AM, please contact night-coverage [email or web address] Password [facility] 08/09/2016, 8:17 AM

## 2016-08-09 NOTE — Progress Notes (Signed)
Patient in bed in NAD.  Call bell within reach.  A.Sherman Lipuma, RN

## 2016-08-09 NOTE — Progress Notes (Signed)
RT called to patient room due to orders for ABG and to place patient on bipap.  RT attempted ABG and unable to obtain, MD aware.  Placed patient on bipap and is currently tolerating well.  Will continue to monitor.

## 2016-08-09 NOTE — Progress Notes (Signed)
Patient transferred to 4E05 with rapid response and respiratory at bedside. Report given to [facility] on 4E. A.Lavi Sheehan, RN

## 2016-08-09 NOTE — Progress Notes (Signed)
Patient complaining of back pain.  MD notified and at bedside. Awaiting new orders. A.Pharrell Ledford, RN

## 2016-08-09 NOTE — Progress Notes (Signed)
Respiratory at bedside drawing ABG.  Rapid Response called and made aware of pending Bipap order. A.Xochitl Egle, RN

## 2016-08-09 NOTE — Progress Notes (Signed)
Pharmacy Antibiotic Note  Anna [name] is a 56 y.o. female admitted on 08/04/2016 with acute encephalopathy and SOB. Now with concern for PNA and pharmacy consulted to start Vancomycin along with Cefepime per MD. SCr 1.23, CrCl~50-60 ml/min.   Plan: 1. Vancomycin 1500 mg IV x 1 followed by 750 mg IV every 12 hours 2. Continue Cefepime 1g IV every 8 hours per MD 3. Will continue to follow renal function, culture results, LOT, and antibiotic de-escalation plans   Height: 5\' 7"  (170.2 cm) Weight: 190 lb (86.2 kg) (Scale B) IBW/kg (Calculated) : 61.6  Temp (24hrs), Avg:98.7 F (37.1 C), Min:98.6 F (37 C), Max:98.9 F (37.2 C)   Recent Labs Lab 08/04/16 1623 08/04/16 1710  08/05/16 2101 08/06/16 0546 08/07/16 0521 08/08/16 0616 08/08/16 1004 08/09/16 0330  WBC 13.0*  --   --  8.8  --  10.2  --  10.9* 22.5*  CREATININE 1.58*  --   < > 1.40* 1.39* 1.24* 1.11*  --  1.23*  LATICACIDVEN  --  1.06  --   --   --   --   --   --   --   < > = values in this interval not displayed.  Estimated Creatinine Clearance: 56.9 mL/min (by C-G formula based on SCr of 1.23 mg/dL (H)).    No Known Allergies  Antimicrobials this admission: CTX 1/29 >> 1/31 Azithro 1/29 >> 1/31 Cefepime 2/3 >> Vanc 2/3 >>  Dose adjustments this admission: n/a  Microbiology results: 1/29 BCx >> NG 1/31 CSF cx >> NG 2/3 UCx >> 2/3 BCx >> 2/3 RCx >>   Thank you for allowing pharmacy to be a part of this patient's care.  Alycia Rossetti, PharmD, BCPS Clinical Pharmacist Pager: [phone] 08/09/2016 7:32 PM

## 2016-08-09 NOTE — Progress Notes (Signed)
IV Lasix administered per order. A.Cecile Gillispie, RN

## 2016-08-10 ENCOUNTER — Inpatient Hospital Stay (HOSPITAL_COMMUNITY): Payer: BLUE CROSS/BLUE SHIELD

## 2016-08-10 DIAGNOSIS — J90 Pleural effusion, not elsewhere classified: Secondary | ICD-10-CM | POA: Diagnosis present

## 2016-08-10 DIAGNOSIS — J811 Chronic pulmonary edema: Secondary | ICD-10-CM | POA: Insufficient documentation

## 2016-08-10 DIAGNOSIS — J810 Acute pulmonary edema: Secondary | ICD-10-CM

## 2016-08-10 LAB — BASIC METABOLIC PANEL
Anion gap: 10 (ref 5–15)
BUN: 30 mg/dL — AB (ref 6–20)
CALCIUM: 8.6 mg/dL — AB (ref 8.9–10.3)
CO2: 26 mmol/L (ref 22–32)
CREATININE: 1.33 mg/dL — AB (ref 0.44–1.00)
Chloride: 103 mmol/L (ref 101–111)
GFR calc Af Amer: 50 mL/min — ABNORMAL LOW (ref >60)
GFR, EST NON AFRICAN AMERICAN: 43 mL/min — AB (ref >60)
GLUCOSE: 134 mg/dL — AB (ref 65–99)
Potassium: 4.2 mmol/L (ref 3.5–5.1)
Sodium: 139 mmol/L (ref 135–145)

## 2016-08-10 LAB — TROPONIN I
Troponin I: 0.03 ng/mL (ref <0.03)
Troponin I: 0.05 ng/mL (ref <0.03)

## 2016-08-10 LAB — RESPIRATORY PANEL BY PCR
Adenovirus: NOT DETECTED
BORDETELLA PERTUSSIS-RVPCR: NOT DETECTED
CORONAVIRUS OC43-RVPPCR: NOT DETECTED
Chlamydophila pneumoniae: NOT DETECTED
Coronavirus 229E: NOT DETECTED
Coronavirus HKU1: NOT DETECTED
Coronavirus NL63: NOT DETECTED
Influenza A: NOT DETECTED
Influenza B: NOT DETECTED
METAPNEUMOVIRUS-RVPPCR: NOT DETECTED
Mycoplasma pneumoniae: NOT DETECTED
PARAINFLUENZA VIRUS 2-RVPPCR: NOT DETECTED
PARAINFLUENZA VIRUS 3-RVPPCR: NOT DETECTED
Parainfluenza Virus 1: NOT DETECTED
Parainfluenza Virus 4: NOT DETECTED
RESPIRATORY SYNCYTIAL VIRUS-RVPPCR: NOT DETECTED
RHINOVIRUS / ENTEROVIRUS - RVPPCR: NOT DETECTED

## 2016-08-10 LAB — CBC WITH DIFFERENTIAL/PLATELET
BASOS PCT: 0 %
Basophils Absolute: 0 10*3/uL (ref 0.0–0.1)
Eosinophils Absolute: 0 10*3/uL (ref 0.0–0.7)
Eosinophils Relative: 0 %
HEMATOCRIT: 27.4 % — AB (ref 36.0–46.0)
Hemoglobin: 9.3 g/dL — ABNORMAL LOW (ref 12.0–15.0)
Lymphocytes Relative: 7 %
Lymphs Abs: 2.6 10*3/uL (ref 0.7–4.0)
MCH: 30.1 pg (ref 26.0–34.0)
MCHC: 33.9 g/dL (ref 30.0–36.0)
MCV: 88.7 fL (ref 78.0–100.0)
Monocytes Absolute: 3.3 10*3/uL — ABNORMAL HIGH (ref 0.1–1.0)
Monocytes Relative: 9 %
NEUTROS ABS: 30.9 10*3/uL — AB (ref 1.7–7.7)
Neutrophils Relative %: 84 %
PLATELETS: 439 10*3/uL — AB (ref 150–400)
RBC: 3.09 MIL/uL — ABNORMAL LOW (ref 3.87–5.11)
RDW: 19.6 % — ABNORMAL HIGH (ref 11.5–15.5)
WBC: 36.8 10*3/uL — ABNORMAL HIGH (ref 4.0–10.5)

## 2016-08-10 LAB — HIV ANTIBODY (ROUTINE TESTING W REFLEX): HIV SCREEN 4TH GENERATION: NONREACTIVE

## 2016-08-10 LAB — MAGNESIUM: Magnesium: 2.1 mg/dL (ref 1.7–2.4)

## 2016-08-10 LAB — STREP PNEUMONIAE URINARY ANTIGEN: Strep Pneumo Urinary Antigen: NEGATIVE

## 2016-08-10 MED ORDER — METHYLPREDNISOLONE SODIUM SUCC 125 MG IJ SOLR
60.0000 mg | Freq: Two times a day (BID) | INTRAMUSCULAR | Status: DC
  Administered 2016-08-10 – 2016-08-12 (×5): 60 mg via INTRAVENOUS
  Filled 2016-08-10 (×5): qty 2

## 2016-08-10 NOTE — Progress Notes (Signed)
Spoke with Dr Sherral Hammers and he stated repiratory could try to wean pt off bipap. Consuelo Pandy RN

## 2016-08-10 NOTE — Progress Notes (Addendum)
PROGRESS NOTE    Anna [name]  TDV:[id number] DOB: Jul 09, 1960 DOA: 08/04/2016 PCP: [name], MD     Brief Narrative:  56 y.o.BF PMHx Depression, CVA, Lupus, Hypothyroidism, HTN, Chronic CHF   who presents to the emergency department with 2 months of progressive malaise and generalized weakness with acute worsening in dyspnea and acute encephalopathy. Patient is accompanied by her husband who provides much of the history. She has reportedly been increasingly fatigued and lethargic for the past 2 months and has begun complaining of shortness of breath for the past couple days she has become increasingly altered over the past couple days and was very confused today, prompting her presentation. There has been no known head trauma, use of alcohol or illicit substances, and no fevers. Patient has been coughing, but it does not seem to be productive. No leg swelling has been noted. There has been no recent changes in the patient's medications. No vomiting or diarrhea per report of the patient's husband. Appetite has been poor and the patient has been sleeping more.  Interim history Rapid response called as patient was altered and hypoxic. Patient currently stable. Continue to monitor mental status, neuro checks. MRI brain ordered. LP ordered.    Subjective: 2/4 afebrile last 24 hours. States does not recall what happened yesterday resulting in her being transferred to SDU. Currently A/O 4. States feels improved but continues to have some right-sided chest pain. Request to be helped to the chair.    Assessment & Plan:   Principal Problem:   Acute CHF (congestive heart failure) (HCC) Active Problems:   SLE (systemic lupus erythematosus) (HCC)   Depression   Hypothyroid   Essential hypertension, benign   GERD (gastroesophageal reflux disease)   Altered mental status   AKI (acute kidney injury) ([place])   Anemia   Acute respiratory failure with hypoxia (HCC)   Acute  encephalopathy   Long-term use of Plaquenil   SIRS (systemic inflammatory response syndrome) (HCC)   Accidental drug overdose   Acute diastolic CHF (congestive heart failure) ([place])   HCAP (healthcare-associated pneumonia)  SIRS -2/3 patient meets guidelines for SIRS temp> 38C, leukocytosis> 12 K. Unsure patient meets septic guidelines yet. -PCXR: Showed right-sided pleural effusion and bilateral pulmonary  edema.  -Blood culture, urine culture, sputum culture NGTD -Lactic acid: WNL  Acute/Subacuteencephalopathy  -suspect polypharmacy related: Patient admits to overdose on at least gabapentin -home meds lists Tegretol-high dose, Valium, Lexapro, Hydrocodone, Robaxin, Seroquel and Gabapentin-all of these meds have sedating properties -Drug screen +benzo, infectious workup negative-Influenza negative, Blood/Urine and CSF cultures neg -LP not suggestive of infection or inflammation with Normal protein and 4wbcs-given this I doubt cerebritis -TSH low but free T4 normal, -Vitamin B12 313, -Ammonia 22 -CSF VDRL nonreactive -CT head unremarkable, MRI with a incomplete study however sequences obtained were unremarkable. -Neurology: Signed off believing*"most likely culprit in polypharmacy overdose.  -PT eval: SNF -cut down sedating meds -Per neurology no further workup needed at this time.  Acute Diastolic CHF  -CXR showed CHF with interstitial edema and clinically evidence of vol overload -2/3 Hypoxia  -Strict in and out since admission -5.2 L -Daily weight Filed Weights   08/08/16 0343 08/09/16 0732 08/10/16 0319  Weight: 86.8 kg (191 lb 6.4 oz) 86.2 kg (190 lb) 86.7 kg (191 lb 2.2 oz)  -Lasix IV 40 mg TID  HCAP -Leukocytosis + fever + tachypnea -Although tachypnea could be secondary to patient's pleural effusion/pulmonary edema given increased WBC and fever Will aire on the  side of caution and after blood/urine culture, sputum drawn start empiric antibiotics. -Continue 5 day  course antibiotics. -Solu-Medrol 60 mg BID -Flutter valve  Pulmonary edema/Right Pleural effusion -2/3 patient tachypnea PCXR shows above findings. -ABG -Lasix IV 80 mg, +BIPAP -Scheduled Thoracentesis for 2/5  Acute kidney injury (baseline Cr 1.1) -Creatinine 1.58 on admission -Losartan held Lab Results  Component Value Date   CREATININE 1.33 (H) 08/10/2016   CREATININE 1.23 (H) 08/09/2016   CREATININE 1.11 (H) 08/08/2016   Normocytic anemia  -Hemoglobin 7.7 on admission, down from 11.0 in April 2017  -Hemoglobin now at 10.5 range(no blood transfusion given) -Anemia panel: Iron 9, Ferritin 75, folate 32.6, B12 313 -Given IV iron (iron dextrose), will need iron at discharge and workup as outpatient  Leukocytosis -Demargination? Sepsis? Malignancy?  Hypothyroid  -TSH 0.05, FT4 0.64 -Continue Synthroid   SLE  -Managed with Plaquenil and methotrexate at home; Plaquenil held in light of worsened anemia  -do not suspect cerebritis with normal LP -Empiric steroids have been discontinued.  Depression  -stopped seroquel secondary to acute encephalopathy with clinical improvement. Patient will need to follow-up with psychiatrist and outpatient setting for further recommendations. -contiue lexapro  GERD -Continue pepcid   DVT prophylaxis: SCD  Code Status: Full Family Communication: Spoke with sister bedside and discuss plan of care Disposition Plan: PT eval: SNF   Consultants:  Neurology: Dr. Leonel Ramsay 08/05/2016   Procedures/Significant Events:  CT abdominal/pelvis 08/04/2016 Chest x-ray 08/04/2016, 08/05/2016 Abdominal x-rays 08/04/2016 MRI brain 08/06/2016 2-D echo 08/06/2016 Fluoroscopy guided lumbar puncture 08/06/2016 per Dr Pascal Lux.    VENTILATOR SETTINGS: BiPAP   Cultures 1/29 blood 2 negative 1/29 urine negative 1/31 CSF negative 2/3 blood NGTD 2/3 urine pending 2/3 sputum pending   Antimicrobials: Anti-infectives    Start      Stop   08/10/16 1000  vancomycin (VANCOCIN) IVPB 750 mg/150 ml premix         08/09/16 2200  ceFEPIme (MAXIPIME) 1 g in dextrose 5 % 50 mL IVPB     08/18/16 0159   08/09/16 2000  vancomycin (VANCOCIN) 1,500 mg in sodium chloride 0.9 % 500 mL IVPB     08/10/16 0047   08/06/16 2200  azithromycin (ZITHROMAX) tablet 500 mg  Status:  Discontinued     08/07/16 1222   08/05/16 1915  cefTRIAXone (ROCEPHIN) 1 g in dextrose 5 % 50 mL IVPB  Status:  Discontinued     08/07/16 1222   08/05/16 1915  azithromycin (ZITHROMAX) 500 mg in dextrose 5 % 250 mL IVPB  Status:  Discontinued     08/06/16 0838   08/04/16 1915  cefTRIAXone (ROCEPHIN) 1 g in dextrose 5 % 50 mL IVPB     08/04/16 2116   08/04/16 1915  azithromycin (ZITHROMAX) 500 mg in dextrose 5 % 250 mL IVPB     08/04/16 2218       Devices   LINES / TUBES:      Continuous Infusions:   Objective: Vitals:   08/09/16 2200 08/09/16 2300 08/10/16 0032 08/10/16 0319  BP: 110/83 (!) 144/87 120/76 123/74  Pulse: 83 85 79 78  Resp: (!) 26 (!) 27 (!) 23 (!) 24  Temp:   97.9 F (36.6 C) 98.6 F (37 C)  TempSrc:   Axillary Axillary  SpO2: 98% 97% 96% 96%  Weight:    86.7 kg (191 lb 2.2 oz)  Height:        Intake/Output Summary (Last 24 hours) at 08/10/16 3474 Last data  filed at 08/09/16 1938  Gross per 24 hour  Intake              640 ml  Output             1000 ml  Net             -360 ml   Filed Weights   08/08/16 0343 08/09/16 0732 08/10/16 0319  Weight: 86.8 kg (191 lb 6.4 oz) 86.2 kg (190 lb) 86.7 kg (191 lb 2.2 oz)    Examination:  General: A/O 4, Much more awake and interactive, still some confusion, positive acute respiratory distress Eyes: negative scleral hemorrhage, negative anisocoria, negative icterus ENT: Negative Runny nose, negative gingival bleeding, Neck:  Negative scars, masses, torticollis, lymphadenopathy, JVD Lungs:decreased breath sounds right lung fields ( improved from 2/3). Left lung fields clear  to auscultation, Cardiovascular:  Regular rhythm and rate without murmur gallop or rub normal S1 and S2 Abdomen: negative abdominal pain, nondistended, positive soft, bowel sounds, no rebound, no ascites, no appreciable mass Extremities: No significant cyanosis, clubbing, or edema bilateral lower extremities Skin: Negative rashes, lesions, ulcers Psychiatric:  Negative depression, negative anxiety, negative fatigue, negative mania  Central nervous system:  Cranial nerves II through XII intact, tongue/uvula midline, all extremities muscle strength 5/5, sensation intact throughout, negative dysarthria, negative expressive aphasia, negative receptive aphasia.  .     Data Reviewed: Care during the described time interval was provided by me .  I have reviewed this patient's available data, including medical history, events of note, physical examination, and all test results as part of my evaluation. I have personally reviewed and interpreted all radiology studies.  CBC:  Recent Labs Lab 08/04/16 1623 08/05/16 2101 08/07/16 0521 08/08/16 1004 08/09/16 0330 08/10/16 0059  WBC 13.0* 8.8 10.2 10.9* 22.5* 36.8*  NEUTROABS 10.5*  --   --  6.9  --  30.9*  HGB 7.7* 9.0* 9.2* 10.5* 10.7* 9.3*  HCT 21.3* 26.0* 26.3* 29.9* 31.9* 27.4*  MCV 88.8 83.6 88.6 88.7 87.9 88.7  PLT 426* 336 509* 556* 509* 258*   Basic Metabolic Panel:  Recent Labs Lab 08/05/16 0405  08/06/16 0546 08/07/16 0521 08/08/16 0616 08/09/16 0330 08/10/16 0059  NA 143  < > 139 141 140 138 139  K 4.0  < > 4.1 4.0 3.4* 4.4 4.2  CL 113*  < > 107 108 103 103 103  CO2 17*  < > 23 23 25 25 26   GLUCOSE 109*  < > 131* 125* 101* 142* 134*  BUN 22*  < > 27* 30* 25* 25* 30*  CREATININE 1.54*  < > 1.39* 1.24* 1.11* 1.23* 1.33*  CALCIUM 8.4*  < > 9.1 9.1 9.0 8.7* 8.6*  MG 2.1  --   --   --   --   --  2.1  < > = values in this interval not displayed. GFR: Estimated Creatinine Clearance: 52.8 mL/min (by C-G formula based on SCr  of 1.33 mg/dL (H)). Liver Function Tests:  Recent Labs Lab 08/04/16 1623  AST 18  ALT 11*  ALKPHOS 87  BILITOT 0.5  PROT 6.9  ALBUMIN 2.5*   No results for input(s): LIPASE, AMYLASE in the last 168 hours.  Recent Labs Lab 08/05/16 0405  AMMONIA 22   Coagulation Profile:  Recent Labs Lab 08/04/16 1623  INR 1.47   Cardiac Enzymes:  Recent Labs Lab 08/04/16 2315 08/05/16 0405 08/05/16 1102 08/09/16 1937 08/10/16 0059  TROPONINI 0.06* 0.03* 0.03* 0.05*  0.03*   BNP (last 3 results) No results for input(s): PROBNP in the last 8760 hours. HbA1C: No results for input(s): HGBA1C in the last 72 hours. CBG:  Recent Labs Lab 08/04/16 1649  GLUCAP 100*   Lipid Profile: No results for input(s): CHOL, HDL, LDLCALC, TRIG, CHOLHDL, LDLDIRECT in the last 72 hours. Thyroid Function Tests: No results for input(s): TSH, T4TOTAL, FREET4, T3FREE, THYROIDAB in the last 72 hours. Anemia Panel: No results for input(s): VITAMINB12, FOLATE, FERRITIN, TIBC, IRON, RETICCTPCT in the last 72 hours. Sepsis Labs:  Recent Labs Lab 08/04/16 1710 08/09/16 1825  LATICACIDVEN 1.06 1.4    Recent Results (from the past 240 hour(s))  Culture, blood (Routine x 2)     Status: None   Collection Time: 08/04/16  4:33 PM  Result Value Ref Range Status   Specimen Description BLOOD LEFT HAND  Final   Special Requests IN PEDIATRIC BOTTLE  3CC  Final   Culture NO GROWTH 5 DAYS  Final   Report Status 08/09/2016 FINAL  Final  Culture, blood (Routine x 2)     Status: None   Collection Time: 08/04/16  4:57 PM  Result Value Ref Range Status   Specimen Description BLOOD LEFT ARM  Final   Special Requests BOTTLES DRAWN AEROBIC AND ANAEROBIC  5CC  Final   Culture NO GROWTH 5 DAYS  Final   Report Status 08/09/2016 FINAL  Final  Urine culture     Status: None   Collection Time: 08/04/16  8:00 PM  Result Value Ref Range Status   Specimen Description URINE, CATHETERIZED  Final   Special Requests  NONE  Final   Culture NO GROWTH  Final   Report Status 08/06/2016 FINAL  Final  CSF culture     Status: None   Collection Time: 08/06/16  9:37 AM  Result Value Ref Range Status   Specimen Description CSF  Final   Special Requests TUBE 2  Final   Gram Stain CYTOSPIN SMEAR NO WBC SEEN NO ORGANISMS SEEN   Final   Culture NO GROWTH 3 DAYS  Final   Report Status 08/09/2016 FINAL  Final         Radiology Studies: Dg Chest Port 1 View  Result Date: 08/09/2016 CLINICAL DATA:  Shortness of breath. History of collagen vascular disease, CHF, hypertension. EXAM: PORTABLE CHEST 1 VIEW COMPARISON:  Chest x-ray dated 08/05/2016. FINDINGS: Large dense opacity now occupying the right mid and lower lung zones, suspected pleural effusion. Mild interstitial prominence persist bilaterally, presumed interstitial edema, decreased compared to the earlier exam. No pneumothorax seen. Heart size difficult to assess, with right border obscured by the right lower lobe density. IMPRESSION: Large dense opacity now occupying the right mid and lower lung zones, most likely a large pleural effusion, possibly accentuated by adjacent atelectasis. If febrile, could not exclude superimposed pneumonia. Electronically Signed   By: Franki Cabot M.D.   On: 08/09/2016 18:30        Scheduled Meds: . amLODipine  10 mg Oral Daily  . atorvastatin  20 mg Oral q1800  . carbamazepine  200 mg Oral q morning - 10a   And  . carbamazepine  400 mg Oral QHS  . ceFEPime (MAXIPIME) IV  1 g Intravenous Q8H  . escitalopram  30 mg Oral Daily  . famotidine  20 mg Oral BID  . furosemide  40 mg Intravenous TID  . levothyroxine  75 mcg Oral QAC breakfast  . metoprolol succinate  50 mg Oral  Daily  . pantoprazole  40 mg Oral Q1200  . sodium chloride flush  3 mL Intravenous Q12H  . vancomycin  750 mg Intravenous Q12H   Continuous Infusions:   LOS: 6 days    Time spent:40 min    WOODS, Geraldo Docker, MD [facility] Pager  [phone]  If 7PM-7AM, please contact night-coverage [email or web address] Password [email or web address] 08/10/2016, 7:09 AM

## 2016-08-11 ENCOUNTER — Inpatient Hospital Stay (HOSPITAL_COMMUNITY): Payer: BLUE CROSS/BLUE SHIELD

## 2016-08-11 DIAGNOSIS — J90 Pleural effusion, not elsewhere classified: Secondary | ICD-10-CM

## 2016-08-11 DIAGNOSIS — M3213 Lung involvement in systemic lupus erythematosus: Secondary | ICD-10-CM

## 2016-08-11 DIAGNOSIS — G9340 Encephalopathy, unspecified: Secondary | ICD-10-CM

## 2016-08-11 LAB — BODY FLUID CELL COUNT WITH DIFFERENTIAL
Eos, Fluid: 0 %
Lymphs, Fluid: 5 %
Monocyte-Macrophage-Serous Fluid: 2 % — ABNORMAL LOW (ref 50–90)
NEUTROPHIL FLUID: 93 % — AB (ref 0–25)
WBC FLUID: 712 uL (ref 0–1000)

## 2016-08-11 LAB — CBC WITH DIFFERENTIAL/PLATELET
BASOS ABS: 0 10*3/uL (ref 0.0–0.1)
Basophils Relative: 0 %
EOS ABS: 0 10*3/uL (ref 0.0–0.7)
Eosinophils Relative: 0 %
HCT: 27.3 % — ABNORMAL LOW (ref 36.0–46.0)
Hemoglobin: 9.5 g/dL — ABNORMAL LOW (ref 12.0–15.0)
LYMPHS ABS: 1.8 10*3/uL (ref 0.7–4.0)
Lymphocytes Relative: 7 %
MCH: 31.1 pg (ref 26.0–34.0)
MCHC: 34.8 g/dL (ref 30.0–36.0)
MCV: 89.5 fL (ref 78.0–100.0)
Monocytes Absolute: 1.5 10*3/uL — ABNORMAL HIGH (ref 0.1–1.0)
Monocytes Relative: 6 %
NEUTROS ABS: 22.4 10*3/uL — AB (ref 1.7–7.7)
Neutrophils Relative %: 87 %
PLATELETS: 437 10*3/uL — AB (ref 150–400)
RBC: 3.05 MIL/uL — ABNORMAL LOW (ref 3.87–5.11)
RDW: 20.6 % — ABNORMAL HIGH (ref 11.5–15.5)
WBC: 25.7 10*3/uL — ABNORMAL HIGH (ref 4.0–10.5)

## 2016-08-11 LAB — BASIC METABOLIC PANEL
Anion gap: 12 (ref 5–15)
BUN: 44 mg/dL — AB (ref 6–20)
CO2: 24 mmol/L (ref 22–32)
CREATININE: 1.62 mg/dL — AB (ref 0.44–1.00)
Calcium: 8.7 mg/dL — ABNORMAL LOW (ref 8.9–10.3)
Chloride: 104 mmol/L (ref 101–111)
GFR calc Af Amer: 40 mL/min — ABNORMAL LOW (ref >60)
GFR, EST NON AFRICAN AMERICAN: 34 mL/min — AB (ref >60)
Glucose, Bld: 117 mg/dL — ABNORMAL HIGH (ref 65–99)
POTASSIUM: 4.2 mmol/L (ref 3.5–5.1)
SODIUM: 140 mmol/L (ref 135–145)

## 2016-08-11 LAB — GRAM STAIN

## 2016-08-11 LAB — PROTEIN, TOTAL: Total Protein: 6.7 g/dL (ref 6.5–8.1)

## 2016-08-11 LAB — MAGNESIUM: MAGNESIUM: 2.2 mg/dL (ref 1.7–2.4)

## 2016-08-11 LAB — URINE CULTURE: CULTURE: NO GROWTH

## 2016-08-11 LAB — PROCALCITONIN: PROCALCITONIN: 0.36 ng/mL

## 2016-08-11 LAB — LACTATE DEHYDROGENASE, PLEURAL OR PERITONEAL FLUID: LD, Fluid: 982 U/L — ABNORMAL HIGH (ref 3–23)

## 2016-08-11 LAB — PROTEIN, BODY FLUID: Total protein, fluid: 4.8 g/dL

## 2016-08-11 LAB — LACTATE DEHYDROGENASE: LDH: 297 U/L — AB (ref 98–192)

## 2016-08-11 LAB — MRSA PCR SCREENING: MRSA BY PCR: NEGATIVE

## 2016-08-11 MED ORDER — HEPARIN SODIUM (PORCINE) 5000 UNIT/ML IJ SOLN
5000.0000 [IU] | Freq: Three times a day (TID) | INTRAMUSCULAR | Status: DC
  Administered 2016-08-11 – 2016-08-16 (×13): 5000 [IU] via SUBCUTANEOUS
  Filled 2016-08-11 (×13): qty 1

## 2016-08-11 MED ORDER — AMLODIPINE BESYLATE 5 MG PO TABS
2.5000 mg | ORAL_TABLET | Freq: Every day | ORAL | Status: DC
  Administered 2016-08-11 – 2016-08-16 (×6): 2.5 mg via ORAL
  Filled 2016-08-11 (×6): qty 1

## 2016-08-11 MED ORDER — FAMOTIDINE 20 MG PO TABS
20.0000 mg | ORAL_TABLET | Freq: Every day | ORAL | Status: DC
  Administered 2016-08-11 – 2016-08-16 (×6): 20 mg via ORAL
  Filled 2016-08-11 (×6): qty 1

## 2016-08-11 MED ORDER — FUROSEMIDE 10 MG/ML IJ SOLN
40.0000 mg | Freq: Every day | INTRAMUSCULAR | Status: DC
  Administered 2016-08-11: 40 mg via INTRAVENOUS
  Filled 2016-08-11: qty 4

## 2016-08-11 MED ORDER — LIDOCAINE HCL 1 % IJ SOLN
INTRAMUSCULAR | Status: AC
  Filled 2016-08-11: qty 20

## 2016-08-11 NOTE — Progress Notes (Signed)
Occupational Therapy Treatment Patient Details Name: Anna [name] MRN: [id number] DOB: 06-Sep-1960 Today's Date: 08/11/2016    History of present illness 56 yo female wtih onset of hypoxia, acute encephalopathy, flat troponins and pulm edema with SOB and AKI was admitted and noted to have acute CHF.  PMHx:  stroke, Lupus, HTN, Depression, hypothyroidism, smoker.    OT comments  Pt demonstrates significant improvements.  She is able to perform ADLs with min guard assist. 02 sats remained >88% on 6L supplemental 02 during activity.   Disposition updated to home with [place].   Will follow.   Follow Up Recommendations  Home health OT;Supervision/Assistance - 24 hour    Equipment Recommendations  Tub/shower bench    Recommendations for Other Services      Precautions / Restrictions Precautions Precautions: Fall       Mobility Bed Mobility Overal bed mobility: Needs Assistance Bed Mobility: Supine to Sit     Supine to sit: Supervision        Transfers Overall transfer level: Needs assistance Equipment used: Rolling walker (2 wheeled) Transfers: Sit to/from [facility] Sit to Stand: Min guard Stand pivot transfers: Min guard            Balance Overall balance assessment: Needs assistance Sitting-balance support: Feet supported Sitting balance-Leahy Scale: Good     Standing balance support: No upper extremity supported;During functional activity Standing balance-Leahy Scale: Fair                     ADL Overall ADL's : Needs assistance/impaired                     Lower Body Dressing: Minimal assistance;Sit to/from stand   Toilet Transfer: Min guard;Ambulation;Comfort height toilet;Regular Toilet;Grab bars;RW   Toileting- Water quality scientist and Hygiene: Min guard;Sit to/from stand       Functional mobility during ADLs: Min guard        Vision                     Perception     Praxis      Cognition    Behavior During Therapy: Flat affect Overall Cognitive Status: Within Functional Limits for tasks assessed (for basic tasks )                       Extremity/Trunk Assessment               Exercises     Shoulder Instructions       General Comments      Pertinent Vitals/ Pain       Pain Assessment: No/denies pain  Home Living                                          Prior Functioning/Environment              Frequency  Min 2X/week        Progress Toward Goals  OT Goals(current goals can now be found in the care plan section)  Progress towards OT goals: Progressing toward goals     Plan Discharge plan needs to be updated    Co-evaluation                 End of Session Equipment Utilized During Treatment: Rolling walker;Oxygen   Activity Tolerance Patient  tolerated treatment well   Patient Left in chair;with call bell/phone within reach;with chair alarm set;with family/visitor present   Nurse Communication Mobility status        Time: 5456-2563 OT Time Calculation (min): 31 min  Charges: OT General Charges $OT Visit: 1 Procedure OT Treatments $Self Care/Home Management : 8-22 mins $Therapeutic Activity: 8-22 mins  Pepe Mineau M 08/11/2016, 7:18 PM

## 2016-08-11 NOTE — Consult Note (Signed)
Name: Anna [name] MRN: [id number] DOB: 1960/08/18    ADMISSION DATE:  08/04/2016 CONSULTATION DATE:  08/11/16  REFERRING MD :  Dr. Posey Pronto   CHIEF COMPLAINT:  Pleural Effusion    HISTORY OF PRESENT ILLNESS:  56 y/o F admitted 1/29 with a 2 month hx of progressive malaise, generalized weakness, confusion and increasing dyspnea.    She was admitted per [facility] for further evaluation.  Initial work up was concerning for acute hypoxic respiratory failure in the setting of decompensated CHF, AKI, anemia and acute encephalopathy.  CXR at that time was concerning for bilateral pleural effusions & cardiomegaly.  Non-contrast CT of the head was negative for intracranial abnormalities.  She was empirically treated for possible pneumonia with rocephin / azithromycin and lasix.  Flu swab assessed and negative.    Overnight 1/30, there were concerns for hypoxia and altered mental status but the patient was stable per documentation.  Work up for altered mental status included an MRI of the brain which was an incomplete study due to motion, obtained sequences were unremarkable.  She was started on solumedrol with her hx of lupus.  Neurology was consulted.  UDS was positive for benzodiazepines, she was pan cultured.   Free tegretol level 2.8 (wnl).  Ammonia level 22, HIV negative.  IR was consulted for an LP.  RPR pending.  ABG demonstrated compensated hypercarbia.  Cultures to date are negative.  There was question that she may have accidentally overdosed on her medications.    ABX for CAP were discontinued on 2/2.  However on 2/3, she had a rise in her WBC from 10.9 to 22.5 with concern for possible HCAP.  ABX were restarted for HCAP coverage.  CXR 2/3 showed a large dense opacity in the R mid and lower lung concerning for possible effusion with associated atelectasis.  She was further evaluated by IR and a thoracentesis was completed which yeilded 40 ml of thick pleural fluid > procedure had to be stopped due to  apparent loculations / thickness of fluid.  Pleural fluid was exudative by lights criteria (LDH 982), WBC 712.    PCCM consulted to evaluate pleural effusion.   PMHx:  CHF, HTN, HLD, depression, lupus (on plaqenil & methotrexate) and CVA  PAST MEDICAL HISTORY :   has a past medical history of Acute CHF ([place]) (07/2016); Depressive disorder, not elsewhere classified; Family history of adverse reaction to anesthesia; Hypertension; Hypothyroid; Kidney stone; Lupus (systemic lupus erythematosus) ([place]); Migraine; Pure hypercholesterolemia; and Stroke ([place]).   has a past surgical history that includes Cesarean section; Tonsillectomy; Lumbar disc surgery (2003); Ectopic pregnancy surgery; Total abdominal hysterectomy (2004); laparoscopy; and Tubal ligation (1985).  Prior to Admission medications   Medication Sig Start Date End Date Taking? Authorizing Provider  amLODipine (NORVASC) 10 MG tablet Take 10 mg by mouth daily.   Yes Historical Provider, MD  aspirin 500 MG EC tablet Take 500 mg by mouth every 6 (six) hours as needed for pain.   Yes Historical Provider, MD  Aspirin-Acetaminophen (GOODY BODY PAIN) 500-325 MG PACK Take 1 Package by mouth daily as needed (pain).   Yes Historical Provider, MD  atorvastatin (LIPITOR) 20 MG tablet Take 20 mg by mouth daily.   Yes Historical Provider, MD  carbamazepine (TEGRETOL) 200 MG tablet Take 200-400 mg by mouth See admin instructions. Take 1 tablet (200 mg) by mouth every morning and 2 tablets (400 mg) at night   Yes Historical Provider, MD  diazepam (VALIUM) 10 MG tablet  Take 5-10 mg by mouth 3 (three) times daily as needed for anxiety.    Yes Historical Provider, MD  diphenhydrAMINE (BENADRYL) 25 MG tablet Take 25 mg by mouth every 6 (six) hours as needed for allergies.   Yes Historical Provider, MD  escitalopram (LEXAPRO) 10 MG tablet Take 30 mg by mouth daily.   Yes Historical Provider, MD  folic acid (FOLVITE) 1 MG tablet Take 1 mg by mouth daily.   Yes  Historical Provider, MD  furosemide (LASIX) 20 MG tablet Take 20 mg by mouth 2 (two) times daily. 01/31/16  Yes Historical Provider, MD  gabapentin (NEURONTIN) 100 MG capsule Take 1 capsule (100 mg total) by mouth 3 (three) times daily. Patient taking differently: Take 300 mg by mouth 3 (three) times daily.  06/20/15  Yes Dennie Bible, NP  HYDROcodone-acetaminophen (NORCO/VICODIN) 5-325 MG tablet Take 1 tablet by mouth 2 (two) times daily. 06/18/16  Yes Historical Provider, MD  hydroxychloroquine (PLAQUENIL) 200 MG tablet Take 200 mg by mouth 2 (two) times daily. Take with food or milk   Yes Historical Provider, MD  levothyroxine (SYNTHROID, LEVOTHROID) 50 MCG tablet Take 50 mcg by mouth daily before breakfast.   Yes Historical Provider, MD  losartan (COZAAR) 100 MG tablet Take 100 mg by mouth daily.   Yes Historical Provider, MD  methocarbamol (ROBAXIN) 500 MG tablet One tablet up to two times daily as needed Patient taking differently: Take 500 mg by mouth 2 (two) times daily as needed for muscle spasms.  02/21/16  Yes Dennie Bible, NP  methotrexate (RHEUMATREX) 2.5 MG tablet Take 7.5 mg by mouth once a week. 08/01/16  Yes Historical Provider, MD  metoprolol succinate (TOPROL-XL) 50 MG 24 hr tablet Take 50 mg by mouth daily. Take with or immediately following a meal.   Yes Historical Provider, MD  Phenyleph-Diphenhyd-DM-APAP (THERAFLU SEVERE COLD & COUGH PO) Take 30 mLs by mouth as needed (for cough).   Yes Historical Provider, MD  QUEtiapine (SEROQUEL) 300 MG tablet Take 300 mg by mouth at bedtime.    Yes Historical Provider, MD    No Known Allergies  FAMILY HISTORY:  family history includes Diabetes in her maternal grandmother and mother; Hypertension in her sister.  SOCIAL HISTORY:  reports that she has been smoking Cigarettes.  She has a 17.50 pack-year smoking history. She has never used smokeless tobacco. She reports that she drinks alcohol. She reports that she does not  use drugs.  REVIEW OF SYSTEMS:  POSITIVES IN BOLD Constitutional: Negative for fever, chills, weight loss, malaise/fatigue and diaphoresis.  HENT: Negative for hearing loss, ear pain, nosebleeds, congestion, sore throat, neck pain, tinnitus and ear discharge.   Eyes: Negative for blurred vision, double vision, photophobia, pain, discharge and redness.  Respiratory: Negative for cough, hemoptysis, sputum production, shortness of breath, wheezing and stridor.   Cardiovascular: Negative for chest pain, palpitations, orthopnea, claudication, leg swelling and PND.  Gastrointestinal: Negative for heartburn, nausea, vomiting, abdominal pain, diarrhea, constipation, blood in stool and melena.  Genitourinary: Negative for dysuria, urgency, frequency, hematuria and flank pain.  Musculoskeletal: Negative for myalgias, back pain, joint pain and falls.  Skin: Negative for itching and rash.  Neurological: Negative for dizziness, tingling, tremors, sensory change, speech change, focal weakness, seizures, loss of consciousness, weakness and headaches.  Endo/Heme/Allergies: Negative for environmental allergies and polydipsia. Does not bruise/bleed easily.  SUBJECTIVE:   VITAL SIGNS: Temp:  [97.8 F (36.6 C)-98.5 F (36.9 C)] 97.8 F (36.6 C) (02/05 0800) Pulse  Rate:  [67-82] 74 (02/05 0906) Resp:  [17-26] 25 (02/05 0906) BP: (116-167)/(70-95) 124/75 (02/05 1215) SpO2:  [90 %-100 %] 91 % (02/05 0906) Weight:  [194 lb 0.1 oz (88 kg)] 194 lb 0.1 oz (88 kg) (02/05 0307)  PHYSICAL EXAMINATION: General:  Obese female in NAD Neuro:  Awake, not oriented, confused, simple responses to questions with poor insight HEENT:  NCAT, mm pink/moist, no jvd Cardiovascular:  s1s2 rrr, mild tachy  Lungs:  Non-labored, diminished right basilar breath sounds, + egophony, left clear  Abdomen:  Obese/soft Musculoskeletal:  No acute deformities  Skin:  Warm/dry, no edema    Recent Labs Lab 08/09/16 0330 08/10/16 0059  08/11/16 0141  NA 138 139 140  K 4.4 4.2 4.2  CL 103 103 104  CO2 _0 BUN 25* 30* 44*  CREATININE 1.23* 1.33* 1.62*  GLUCOSE 142* 134* 117*     Recent Labs Lab 08/09/16 0330 08/10/16 0059 08/11/16 0819  HGB 10.7* 9.3* 9.5*  HCT 31.9* 27.4* 27.3*  WBC 22.5* 36.8* 25.7*  PLT 509* 439* 437*    Dg Chest 1 View  Result Date: 08/11/2016 CLINICAL DATA:  Post thoracentesis, history lupus, hypertension, CHF, stroke EXAM: CHEST 1 VIEW COMPARISON:  08/10/2016 FINDINGS: Enlargement of cardiac silhouette with slight vascular congestion. Stable mediastinal contours. RIGHT pleural effusion and basilar atelectasis identified. No pneumothorax. Mild streaky LEFT atelectasis LEFT lower lobe. Bones unremarkable. IMPRESSION: No pneumothorax following thoracentesis. Persistent RIGHT basilar atelectasis and effusion. Electronically Signed   By: Lavonia Dana M.D.   On: 08/11/2016 12:34   Dg Chest Port 1 View  Result Date: 08/10/2016 CLINICAL DATA:  Pulmonary edema EXAM: PORTABLE CHEST 1 VIEW COMPARISON:  August 09, 2016 FINDINGS: Dense opacification of the right mid lower lung is stable. Bilateral increased interstitial markings are stable. The cardiomediastinal silhouette is unchanged. No pneumothorax. IMPRESSION: 1. Persistent opacification of the right mid lower lung. 2. Persistent increased interstitial opacities are stable. Electronically Signed   By: Dorise Bullion III M.D   On: 08/10/2016 07:48   Dg Chest Port 1 View  Result Date: 08/09/2016 CLINICAL DATA:  Shortness of breath. History of collagen vascular disease, CHF, hypertension. EXAM: PORTABLE CHEST 1 VIEW COMPARISON:  Chest x-ray dated 08/05/2016. FINDINGS: Large dense opacity now occupying the right mid and lower lung zones, suspected pleural effusion. Mild interstitial prominence persist bilaterally, presumed interstitial edema, decreased compared to the earlier exam. No pneumothorax seen. Heart size difficult to assess, with right border  obscured by the right lower lobe density. IMPRESSION: Large dense opacity now occupying the right mid and lower lung zones, most likely a large pleural effusion, possibly accentuated by adjacent atelectasis. If febrile, could not exclude superimposed pneumonia. Electronically Signed   By: Franki Cabot M.D.   On: 08/09/2016 18:30   US Thoracentesis Asp Pleural Space W/img Guide  Result Date: 08/11/2016 INDICATION: Patient with weakness and worsening dyspnea found to have a right pleural effusion. Request is made for diagnostic and therapeutic thoracentesis. EXAM: ULTRASOUND GUIDED DIAGNOSTIC AND THERAPEUTIC RIGHT THORACENTESIS MEDICATIONS: 10 mL 1% lidocaine COMPLICATIONS: None immediate. PROCEDURE: An ultrasound guided thoracentesis was thoroughly discussed with the patient and questions answered. The benefits, risks, alternatives and complications were also discussed. The patient understands and wishes to proceed with the procedure. Written consent was obtained. Ultrasound was performed to localize and mark an adequate pocket of fluid in the right chest. The area was then prepped and draped in the normal sterile fashion. 1% Lidocaine was used  for local anesthesia. Under ultrasound guidance a Safe-T-centesis was introduced. After 15m of fluid removed, resistance was met. The Safe-T-centesis catheter was removed. A 19 gauge, 7-cm, Yueh catheter was then introduced with 5 mL fluid removed, but again significant resistance met when drawing fluid. Procedure was stopped. The catheter was removed and a dressing applied. FINDINGS: A total of approximately 40 mL of hazy, yellow fluid was removed. Samples were sent to the laboratory as requested by the clinical team. IMPRESSION: Successful ultrasound guided diagnostic and therapeutic right thoracentesis yielding 40 mL of pleural fluid. Read by:  KBrynda GreathousePA-C Electronically Signed   By: JCorrie MckusickD.O.   On: 08/11/2016 12:30      SIGNIFICANT EVENTS  1/29   Admit with AMS, weakness 2/03  Significant increase in WBC, cxr with effusion  2/05  IR thora with thick pleural fluid.  PCCM consulted for evaluation of pleural effusion   STUDIES:  CT Chest 2/5 >>     ASSESSMENT / PLAN:  Large Right Pleural Effusion - exudative by LDH, ddx includes possible parapneumonic effusion in the setting of immunocompromise (methotrexate / plaquenil for lupus), empyema and less likely effusion from CHF.  Also must consider autoimmune related / lupus.  Unable to adequately drain via thoracentesis per IR.  Concerning she may need surgical evaluation.    Plan: Continue O2 for sats 90-95% Assess CT chest now w/o contrast to review pleural space  Consider CVTS consult pending CT review Will need to involve her husband in decision making process as she is unable to consent  Follow intermittent CXR  Trend WBC, fever curve  Diuresis as renal function / BP permit  Minimize sedation Vanco / Cefepime D3/x  Recommend reduction of steroids as tolerated (initially started for lupus) Continue PPI  Aspiration precautions  BNoe Gens NP-C [facility] Pgr: [phone] or if no answer [phone]/11/2016, 2:32 PM   Attending Note:  I have examined patient, reviewed labs, studies and notes. I have discussed the case with B Ollis, and I agree with the data and plans as amended above.   56yo woman with SLE on immunosuppression and a complicated recent medical history that includes an extensive evaluation for altered MS and confusion over last 2 months. She was admitted on this occasion for hypoxemia, found to have bilateral effusions. She was treated empirically for PNA, was treated with solumedrol for potential auto-immune contribution to either her encephalopathy, effusions or both. Abx were broadened 2/3. Attempted R thoracentesis on 2/4 was low yield, [place] revealed septations and a complicated pleural space. Certainly could reflect SLE effusion,  empyema. Labs are pending. We will check a CT scan chest to better characterize both pleural spaces. ? Whether she will need a TCTS eval for possible VATS decortication.    RBaltazar Apo MD, PhD 08/11/2016, 5:04 PM [facility] [phone] if no answer [phone]

## 2016-08-11 NOTE — Clinical Social Work Note (Signed)
CSW continues to follow for discharge needs.  Shreyas Piatkowski, CSW [phone]  

## 2016-08-11 NOTE — Procedures (Signed)
PROCEDURE SUMMARY:  Successful US guided right thoracentesis. Yielded 40 mL of hazy, yellow fluid. Pt tolerated procedure well. No immediate complications.  Procedure stopped prior to removal of all pleural fluid due to apparent loculations and thickness of fluid.   Specimen was sent for labs. CXR ordered.  Docia Barrier PA-C 08/11/2016 12:21 PM

## 2016-08-11 NOTE — Care Management Note (Signed)
Case Management Note  Patient Details  Name: Anna [name] MRN: [id number] Date of Birth: 25-Oct-1960  Subjective/Objective:  Presents with acute encephalopathy, chf, CAP, aki, normocytic anemia, hypothyroid, sle, depression, and GERD,  Per pt eval rec SNF, CSW aware.                     Action/Plan:   Expected Discharge Date:                  Expected Discharge Plan:  Skilled Nursing Facility  In-House Referral:  Clinical Social Work  Discharge planning Services  CM Consult  Post Acute Care Choice:    Choice offered to:     DME Arranged:    DME Agency:     HH Arranged:    [place] Agency:     Status of Service:  Completed, signed off  If discussed at [employer], dates discussed:    Additional Comments:  Zenon Mayo, RN 08/11/2016, 3:21 PM

## 2016-08-11 NOTE — Progress Notes (Signed)
[facility] Progress Note  Patient: Anna [name] LKG:[id number]   PCP: [name], MD DOB: Apr 05, 1961   DOA: 08/04/2016   DOS: 08/11/2016   Date of Service: the patient was seen and examined on 08/11/2016   Subjective: Somewhat confused, denies any acute complaint. No chest pain and abdominal pain no fever no chills.  Brief hospital course: Pt. with PMH of depression, CVA, lupus, hypothyroidism, HTN, CHF; admitted on 08/04/2016, with complaint of generalized fatigue and malaise with shortness of breath and cough and encephalopathy, was found to have healthcare associated pneumonia, acute on chronic CHF and acute hypoxic respiratory failure. Currently further plan is further workup of above.  Assessment and Plan: 1. Acute hypoxic respiratory failure. Suspected healthcare associated pneumonia. Acute on chronic diastolic CHF. Currently requiring 6 L of oxygen to maintain adequate saturation. Undergoing thoracentesis. Pulmonary consulted. Patient has been on antibiotics for 3 days and IV Lasix for 6 days despite which her oxygenation has not improved. LDH significantly elevated and the fluid, serum LDH pending. This appears to be possible exudative infusion. Check pro-calcitonin. He was on IV Lasix 3 times a day, switch to daily. Blood cultures and urine cultures as well as sputum cultures no growth at present. On IV Solu Medrol 60 mg twice a day, ideally like to start tapering it tomorrow. BiPAP when necessary.  2. Acute encephalopathy. Sepsis present on admission, currently mild asterixis still present. No evidence of CNS inflammation. Lumbar puncture normal protein and WBC. Patient improved significantly from her presentation but still not at his baseline. Likely polypharmacy is the cause. TSH and free T4 unremarkable, B-12 313, ammonia 22. VDRL CSF negative. CT head unremarkable, MRI brain unremarkable.  3. Normocytic anemia. Humulin 10.5. B-12 low. Start  replacing. Patient was given IV iron in the hospital.  4. Hypothyroidism. Currently appears stable based on TSH and free T4. Continue Synthroid.  5. SLE. On Plaquenil and methotrexate at home. Currently on hold in the setting of suspected infection.  6. Depression. medications are on hold given presentation with acute encephalopathy. Continue Lexapro Patient will follow up with outpatient psychiatry.  Bowel regimen: last BM 08/10/2016 Diet: cardiac Diet DVT Prophylaxis: subcutaneous Heparin Advance goals of care discussion: full code  Family Communication: family was present at bedside, at the time of interview. The pt provided permission to discuss medical plan with the family. Opportunity was given to ask question and all questions were answered satisfactorily.   Disposition:  Discharge to home. Expected discharge date: 08/15/2016, improvement in respiratory status.  Consultants: Pulmonary, neurology Procedures: Lumbar puncture, thoracentesis, echocardiogram  Antibiotics: Anti-infectives    Start     Dose/Rate Route Frequency Ordered Stop   08/10/16 1000  vancomycin (VANCOCIN) IVPB 750 mg/150 ml premix     750 mg 150 mL/hr over 60 Minutes Intravenous Every 12 hours 08/09/16 1933     08/09/16 2200  ceFEPIme (MAXIPIME) 1 g in dextrose 5 % 50 mL IVPB     1 g 100 mL/hr over 30 Minutes Intravenous Every 8 hours 08/09/16 1921 08/18/16 0159   08/09/16 2000  vancomycin (VANCOCIN) 1,500 mg in sodium chloride 0.9 % 500 mL IVPB     1,500 mg 250 mL/hr over 120 Minutes Intravenous  Once 08/09/16 1933 08/10/16 0047   08/06/16 2200  azithromycin (ZITHROMAX) tablet 500 mg  Status:  Discontinued     500 mg Oral Daily at bedtime 08/06/16 0839 08/07/16 1222   08/05/16 1915  cefTRIAXone (ROCEPHIN) 1 g in dextrose 5 % 50  mL IVPB  Status:  Discontinued     1 g 100 mL/hr over 30 Minutes Intravenous Every 24 hours 08/04/16 2209 08/07/16 1222   08/05/16 1915  azithromycin (ZITHROMAX) 500 mg  in dextrose 5 % 250 mL IVPB  Status:  Discontinued     500 mg 250 mL/hr over 60 Minutes Intravenous Every 24 hours 08/04/16 2209 08/06/16 0838   08/04/16 1915  cefTRIAXone (ROCEPHIN) 1 g in dextrose 5 % 50 mL IVPB     1 g 100 mL/hr over 30 Minutes Intravenous  Once 08/04/16 1901 08/04/16 2116   08/04/16 1915  azithromycin (ZITHROMAX) 500 mg in dextrose 5 % 250 mL IVPB     500 mg 250 mL/hr over 60 Minutes Intravenous  Once 08/04/16 1901 08/04/16 2218        Objective: Physical Exam: Vitals:   08/11/16 0906 08/11/16 1116 08/11/16 1215 08/11/16 1500  BP:  121/81 124/75   Pulse: 74   72  Resp: (!) 25   (!) 23  Temp:      TempSrc:      SpO2: 91%   93%  Weight:      Height:        Intake/Output Summary (Last 24 hours) at 08/11/16 1538 Last data filed at 08/11/16 1445  Gross per 24 hour  Intake              490 ml  Output              900 ml  Net             -410 ml   Filed Weights   08/09/16 0732 08/10/16 0319 08/11/16 0307  Weight: 86.2 kg (190 lb) 86.7 kg (191 lb 2.2 oz) 88 kg (194 lb 0.1 oz)    General: Alert, Awake and Oriented to Time, Place and Person, Slow to respond. Appear in mild distress, affect appropriate Eyes: PERRL, Conjunctiva normal ENT: Oral Mucosa clear moist. Neck: no JVD, no Abnormal Mass Or lumps Cardiovascular: S1 and S2 Present, no Murmur, Respiratory: Bilateral Air entry equal and Decreased, no use of accessory muscle, balsa Crackles, no wheezes Abdomen: Bowel Sound present, Soft and no tenderness Skin: no redness, no Rash, no induration Extremities: trace Pedal edema, no calf tenderness Neurologic: Grossly no focal neuro deficit. Bilaterally Equal motor strength  Data Reviewed: CBC:  Recent Labs Lab 08/04/16 1623  08/07/16 0521 08/08/16 1004 08/09/16 0330 08/10/16 0059 08/11/16 0819  WBC 13.0*  < > 10.2 10.9* 22.5* 36.8* 25.7*  NEUTROABS 10.5*  --   --  6.9  --  30.9* 22.4*  HGB 7.7*  < > 9.2* 10.5* 10.7* 9.3* 9.5*  HCT 21.3*  < >  26.3* 29.9* 31.9* 27.4* 27.3*  MCV 88.8  < > 88.6 88.7 87.9 88.7 89.5  PLT 426*  < > 509* 556* 509* 439* 437*  < > = values in this interval not displayed. Basic Metabolic Panel:  Recent Labs Lab 08/05/16 0405  08/07/16 0521 08/08/16 0616 08/09/16 0330 08/10/16 0059 08/11/16 0141  NA 143  < > 141 140 138 139 140  K 4.0  < > 4.0 3.4* 4.4 4.2 4.2  CL 113*  < > 108 103 103 103 104  CO2 17*  < > _0 GLUCOSE 109*  < > 125* 101* 142* 134* 117*  BUN 22*  < > 30* 25* 25* 30* 44*  CREATININE 1.54*  < > 1.24* 1.11* 1.23* 1.33* 1.62*  CALCIUM 8.4*  < > 9.1 9.0 8.7* 8.6* 8.7*  MG 2.1  --   --   --   --  2.1 2.2  < > = values in this interval not displayed.  Liver Function Tests:  Recent Labs Lab 08/04/16 1623  AST 18  ALT 11*  ALKPHOS 87  BILITOT 0.5  PROT 6.9  ALBUMIN 2.5*   No results for input(s): LIPASE, AMYLASE in the last 168 hours.  Recent Labs Lab 08/05/16 0405  AMMONIA 22   Coagulation Profile:  Recent Labs Lab 08/04/16 1623  INR 1.47   Cardiac Enzymes:  Recent Labs Lab 08/05/16 0405 08/05/16 1102 08/09/16 1937 08/10/16 0059 08/10/16 0629  TROPONINI 0.03* 0.03* 0.05* 0.03* 0.05*   BNP (last 3 results) No results for input(s): PROBNP in the last 8760 hours.  CBG:  Recent Labs Lab 08/04/16 1649  GLUCAP 100*    Studies: Dg Chest 1 View  Result Date: 08/11/2016 CLINICAL DATA:  Post thoracentesis, history lupus, hypertension, CHF, stroke EXAM: CHEST 1 VIEW COMPARISON:  08/10/2016 FINDINGS: Enlargement of cardiac silhouette with slight vascular congestion. Stable mediastinal contours. RIGHT pleural effusion and basilar atelectasis identified. No pneumothorax. Mild streaky LEFT atelectasis LEFT lower lobe. Bones unremarkable. IMPRESSION: No pneumothorax following thoracentesis. Persistent RIGHT basilar atelectasis and effusion. Electronically Signed   By: Lavonia Dana M.D.   On: 08/11/2016 12:34   US Thoracentesis Asp Pleural Space W/img  Guide  Result Date: 08/11/2016 INDICATION: Patient with weakness and worsening dyspnea found to have a right pleural effusion. Request is made for diagnostic and therapeutic thoracentesis. EXAM: ULTRASOUND GUIDED DIAGNOSTIC AND THERAPEUTIC RIGHT THORACENTESIS MEDICATIONS: 10 mL 1% lidocaine COMPLICATIONS: None immediate. PROCEDURE: An ultrasound guided thoracentesis was thoroughly discussed with the patient and questions answered. The benefits, risks, alternatives and complications were also discussed. The patient understands and wishes to proceed with the procedure. Written consent was obtained. Ultrasound was performed to localize and mark an adequate pocket of fluid in the right chest. The area was then prepped and draped in the normal sterile fashion. 1% Lidocaine was used for local anesthesia. Under ultrasound guidance a Safe-T-centesis was introduced. After 43m of fluid removed, resistance was met. The Safe-T-centesis catheter was removed. A 19 gauge, 7-cm, Yueh catheter was then introduced with 5 mL fluid removed, but again significant resistance met when drawing fluid. Procedure was stopped. The catheter was removed and a dressing applied. FINDINGS: A total of approximately 40 mL of hazy, yellow fluid was removed. Samples were sent to the laboratory as requested by the clinical team. IMPRESSION: Successful ultrasound guided diagnostic and therapeutic right thoracentesis yielding 40 mL of pleural fluid. Read by:  KBrynda GreathousePA-C Electronically Signed   By: JCorrie MckusickD.O.   On: 08/11/2016 12:30     Scheduled Meds: . amLODipine  2.5 mg Oral Daily  . atorvastatin  20 mg Oral q1800  . carbamazepine  200 mg Oral q morning - 10a   And  . carbamazepine  400 mg Oral QHS  . ceFEPime (MAXIPIME) IV  1 g Intravenous Q8H  . escitalopram  30 mg Oral Daily  . famotidine  20 mg Oral Daily  . levothyroxine  75 mcg Oral QAC breakfast  . lidocaine      . methylPREDNISolone (SOLU-MEDROL) injection  60 mg  Intravenous BID  . metoprolol succinate  50 mg Oral Daily  . pantoprazole  40 mg Oral Q1200  . sodium chloride flush  3 mL Intravenous Q12H  . vancomycin  750 mg Intravenous Q12H   Continuous Infusions: PRN Meds: sodium chloride, acetaminophen, ondansetron (ZOFRAN) IV, sodium chloride flush  Time spent: 30 minutes  Author: Berle Mull, MD [facility] Pager: [phone] 08/11/2016 3:38 PM  If 7PM-7AM, please contact night-coverage at [email or web address], password [facility]

## 2016-08-11 NOTE — Progress Notes (Signed)
PT Cancellation Note  Patient Details Name: Anna [name] MRN: [id number] DOB: February 09, 1961   Cancelled Treatment:    Reason Eval/Treat Not Completed: Patient at procedure or test/unavailable (attempted at 1030 and pt bathing, on return at 1111 pt OOR)   Between 08/11/2016, 11:13 AM  Elwyn Reach, [place]

## 2016-08-12 LAB — CBC WITH DIFFERENTIAL/PLATELET
BASOS ABS: 0 10*3/uL (ref 0.0–0.1)
Basophils Relative: 0 %
EOS PCT: 0 %
Eosinophils Absolute: 0 10*3/uL (ref 0.0–0.7)
HEMATOCRIT: 27.3 % — AB (ref 36.0–46.0)
Hemoglobin: 9.1 g/dL — ABNORMAL LOW (ref 12.0–15.0)
LYMPHS ABS: 1.1 10*3/uL (ref 0.7–4.0)
Lymphocytes Relative: 7 %
MCH: 29.4 pg (ref 26.0–34.0)
MCHC: 33.3 g/dL (ref 30.0–36.0)
MCV: 88.3 fL (ref 78.0–100.0)
MONO ABS: 0.6 10*3/uL (ref 0.1–1.0)
Monocytes Relative: 4 %
NEUTROS ABS: 13.8 10*3/uL — AB (ref 1.7–7.7)
Neutrophils Relative %: 89 %
PLATELETS: 416 10*3/uL — AB (ref 150–400)
RBC: 3.09 MIL/uL — AB (ref 3.87–5.11)
RDW: 19.2 % — AB (ref 11.5–15.5)
WBC: 15.5 10*3/uL — ABNORMAL HIGH (ref 4.0–10.5)

## 2016-08-12 LAB — MAGNESIUM: MAGNESIUM: 2.3 mg/dL (ref 1.7–2.4)

## 2016-08-12 LAB — PH, BODY FLUID: pH, Body Fluid: 7.9

## 2016-08-12 LAB — COMPREHENSIVE METABOLIC PANEL
ALT: 75 U/L — AB (ref 14–54)
AST: 113 U/L — AB (ref 15–41)
Albumin: 2.1 g/dL — ABNORMAL LOW (ref 3.5–5.0)
Alkaline Phosphatase: 70 U/L (ref 38–126)
Anion gap: 13 (ref 5–15)
BILIRUBIN TOTAL: 0.4 mg/dL (ref 0.3–1.2)
BUN: 53 mg/dL — AB (ref 6–20)
CALCIUM: 8.8 mg/dL — AB (ref 8.9–10.3)
CO2: 24 mmol/L (ref 22–32)
CREATININE: 1.78 mg/dL — AB (ref 0.44–1.00)
Chloride: 100 mmol/L — ABNORMAL LOW (ref 101–111)
GFR calc non Af Amer: 31 mL/min — ABNORMAL LOW (ref >60)
GFR, EST AFRICAN AMERICAN: 35 mL/min — AB (ref >60)
Glucose, Bld: 143 mg/dL — ABNORMAL HIGH (ref 65–99)
Potassium: 4.1 mmol/L (ref 3.5–5.1)
Sodium: 137 mmol/L (ref 135–145)
TOTAL PROTEIN: 6.7 g/dL (ref 6.5–8.1)

## 2016-08-12 MED ORDER — CYANOCOBALAMIN 1000 MCG/ML IJ SOLN
1000.0000 ug | Freq: Once | INTRAMUSCULAR | Status: AC
  Administered 2016-08-12: 1000 ug via SUBCUTANEOUS
  Filled 2016-08-12: qty 1

## 2016-08-12 MED ORDER — METHYLPREDNISOLONE SODIUM SUCC 40 MG IJ SOLR
40.0000 mg | Freq: Every day | INTRAMUSCULAR | Status: DC
  Administered 2016-08-13 – 2016-08-15 (×3): 40 mg via INTRAVENOUS
  Filled 2016-08-12 (×3): qty 1

## 2016-08-12 NOTE — Progress Notes (Signed)
[facility] Progress Note  Patient: Anna [name] QPY:[id number]   PCP: [name], MD DOB: 1960-07-27   DOA: 08/04/2016   DOS: 08/12/2016   Date of Service: the patient was seen and examined on 08/12/2016  Subjective: Denies any acute complaint, remains somewhat confused  Brief hospital course: Pt. with PMH of depression, CVA, lupus, hypothyroidism, HTN, CHF; admitted on 08/04/2016, with complaint of generalized fatigue and malaise with shortness of breath and cough and encephalopathy, was found to have healthcare associated pneumonia, acute on chronic CHF and acute hypoxic respiratory failure. Currently further plan is further workup of above.  Assessment and Plan: 1. Acute hypoxic respiratory failure. Suspected healthcare associated pneumonia. Acute on chronic diastolic CHF. Currently requiring 5 L of oxygen to maintain adequate saturation. S/P thoracentesis. Pulmonary consulted. Patient has been on antibiotics for 3 days and IV Lasix for 6 days despite which her oxygenation has not improved. LDH significantly elevated and the fluid, serum LDH pending. This appears to be exudative infusion. Mildly elevated pro-calcitonin. He was on IV Lasix 3 times a day, currently hold Blood cultures and urine cultures as well as sputum cultures no growth at present. On IV Solu Medrol 60 mg twice a day,start tapering it tomorrow. BiPAP when necessary.  2. Acute encephalopathy. Sepsis present on admission, currently mild asterixis still present. No evidence of CNS inflammation. Lumbar puncture normal protein and WBC. Patient improved significantly from her presentation but still not at his baseline. Likely polypharmacy is the cause. TSH and free T4 unremarkable, B-12 313, ammonia 22. VDRL CSF negative. CT head unremarkable, MRI brain unremarkable.  3. Normocytic anemia. Hemoglobin stable B-12 low.one dose of B-12 given. Patient was given IV iron in the hospital.  4.  Hypothyroidism. Currently appears stable based on TSH and free T4. Continue Synthroid.  5. SLE. On Plaquenil and methotrexate at home. Currently on hold in the setting of suspected infection.  6. Depression. medications are on hold given presentation with acute encephalopathy. Continue Lexapro Patient will follow up with outpatient psychiatry.  Bowel regimen: last BM 08/10/2016 Diet: cardiac Diet DVT Prophylaxis: subcutaneous Heparin Advance goals of care discussion: full code  Family Communication: no family was present at bedside, at the time of interview.   Disposition:  Discharge to home. Expected discharge date: 08/15/2016, improvement in respiratory status.  Consultants: Pulmonary, neurology Procedures: Lumbar puncture, thoracentesis, echocardiogram  Antibiotics: Anti-infectives    Start     Dose/Rate Route Frequency Ordered Stop   08/10/16 1000  vancomycin (VANCOCIN) IVPB 750 mg/150 ml premix  Status:  Discontinued     750 mg 150 mL/hr over 60 Minutes Intravenous Every 12 hours 08/09/16 1933 08/12/16 0809   08/09/16 2200  ceFEPIme (MAXIPIME) 1 g in dextrose 5 % 50 mL IVPB     1 g 100 mL/hr over 30 Minutes Intravenous Every 8 hours 08/09/16 1921 08/18/16 0159   08/09/16 2000  vancomycin (VANCOCIN) 1,500 mg in sodium chloride 0.9 % 500 mL IVPB     1,500 mg 250 mL/hr over 120 Minutes Intravenous  Once 08/09/16 1933 08/10/16 0047   08/06/16 2200  azithromycin (ZITHROMAX) tablet 500 mg  Status:  Discontinued     500 mg Oral Daily at bedtime 08/06/16 0839 08/07/16 1222   08/05/16 1915  cefTRIAXone (ROCEPHIN) 1 g in dextrose 5 % 50 mL IVPB  Status:  Discontinued     1 g 100 mL/hr over 30 Minutes Intravenous Every 24 hours 08/04/16 2209 08/07/16 1222   08/05/16 1915  azithromycin (  ZITHROMAX) 500 mg in dextrose 5 % 250 mL IVPB  Status:  Discontinued     500 mg 250 mL/hr over 60 Minutes Intravenous Every 24 hours 08/04/16 2209 08/06/16 0838   08/04/16 1915  cefTRIAXone  (ROCEPHIN) 1 g in dextrose 5 % 50 mL IVPB     1 g 100 mL/hr over 30 Minutes Intravenous  Once 08/04/16 1901 08/04/16 2116   08/04/16 1915  azithromycin (ZITHROMAX) 500 mg in dextrose 5 % 250 mL IVPB     500 mg 250 mL/hr over 60 Minutes Intravenous  Once 08/04/16 1901 08/04/16 2218        Objective: Physical Exam: Vitals:   08/12/16 0708 08/12/16 0800 08/12/16 1113 08/12/16 1553  BP: 127/76  122/83 134/87  Pulse: 67 80 71 73  Resp: (!) 22 19 (!) 24 (!) 25  Temp: 97.8 F (36.6 C)  97.4 F (36.3 C) 98.2 F (36.8 C)  TempSrc: Oral  Oral Oral  SpO2: 94% 93% 93% 94%  Weight:      Height:        Intake/Output Summary (Last 24 hours) at 08/12/16 1557 Last data filed at 08/12/16 1500  Gross per 24 hour  Intake              390 ml  Output             1550 ml  Net            -1160 ml   Filed Weights   08/10/16 0319 08/11/16 0307 08/12/16 0316  Weight: 86.7 kg (191 lb 2.2 oz) 88 kg (194 lb 0.1 oz) 88 kg (194 lb 0.1 oz)    General: Alert, Awake and Oriented to Time, Place and Person, Slow to respond. Appear in mild distress, affect appropriate Eyes: PERRL, Conjunctiva normal ENT: Oral Mucosa clear moist. Neck: no JVD, no Abnormal Mass Or lumps Cardiovascular: S1 and S2 Present, no Murmur, Respiratory: Bilateral Air entry equal and Decreased, no use of accessory muscle, balsa Crackles, no wheezes Abdomen: Bowel Sound present, Soft and no tenderness Skin: no redness, no Rash, no induration Extremities: trace Pedal edema, no calf tenderness Neurologic: Grossly no focal neuro deficit. Bilaterally Equal motor strength  Data Reviewed: CBC:  Recent Labs Lab 08/08/16 1004 08/09/16 0330 08/10/16 0059 08/11/16 0819 08/12/16 0354  WBC 10.9* 22.5* 36.8* 25.7* 15.5*  NEUTROABS 6.9  --  30.9* 22.4* 13.8*  HGB 10.5* 10.7* 9.3* 9.5* 9.1*  HCT 29.9* 31.9* 27.4* 27.3* 27.3*  MCV 88.7 87.9 88.7 89.5 88.3  PLT 556* 509* 439* 437* 893*   Basic Metabolic Panel:  Recent Labs Lab  08/08/16 0616 08/09/16 0330 08/10/16 0059 08/11/16 0141 08/12/16 0354  NA 140 138 139 140 137  K 3.4* 4.4 4.2 4.2 4.1  CL 103 103 103 104 100*  CO2 25 25 26 24 24   GLUCOSE 101* 142* 134* 117* 143*  BUN 25* 25* 30* 44* 53*  CREATININE 1.11* 1.23* 1.33* 1.62* 1.78*  CALCIUM 9.0 8.7* 8.6* 8.7* 8.8*  MG  --   --  2.1 2.2 2.3    Liver Function Tests:  Recent Labs Lab 08/11/16 1601 08/12/16 0354  AST  --  113*  ALT  --  75*  ALKPHOS  --  70  BILITOT  --  0.4  PROT 6.7 6.7  ALBUMIN  --  2.1*   No results for input(s): LIPASE, AMYLASE in the last 168 hours. No results for input(s): AMMONIA in the last 168 hours. Coagulation  Profile: No results for input(s): INR, PROTIME in the last 168 hours. Cardiac Enzymes:  Recent Labs Lab 08/09/16 1937 08/10/16 0059 08/10/16 0629  TROPONINI 0.05* 0.03* 0.05*   BNP (last 3 results) No results for input(s): PROBNP in the last 8760 hours.  CBG: No results for input(s): GLUCAP in the last 168 hours.  Studies: Ct Chest Wo Contrast  Result Date: 08/11/2016 CLINICAL DATA:  Cough.  Right pleural effusion. EXAM: CT CHEST WITHOUT CONTRAST TECHNIQUE: Multidetector CT imaging of the chest was performed following the standard protocol without IV contrast. COMPARISON:  Current chest radiograph and prior chest radiographs. FINDINGS: Cardiovascular: Heart is normal in size. There is a small pericardial effusion. The great vessels are normal in caliber. No aortic or coronary artery atherosclerotic calcifications. Mediastinum/Nodes: No neck base or axillary masses or adenopathy. No mediastinal or hilar masses or enlarged lymph nodes. Esophagus, trachea and mainstem bronchi are unremarkable. Lungs/Pleura: Moderate size right pleural effusion. There is dependent opacity within the right lower lobe and at the base of the right middle lobe. This is consistent with either atelectasis, pneumonia or a combination. Small area of opacity is noted along the  pleural surface of the anteromedial right upper lobe near the minor fissure, also likely atelectasis. There is dependent opacity in the left lower lobe associated with mild bronchiolectasis. The opacity is likely atelectasis. No evidence of pulmonary edema. There are moderate changes of emphysema most evident in the upper lobes. No pneumothorax.  No left pleural effusion. Upper Abdomen: No acute abnormality. Musculoskeletal: No chest wall mass or suspicious bone lesions identified. IMPRESSION: 1. Moderate right pleural effusion. There is dependent right lower lobe and, to lesser degree, right middle lobe opacity. This may all be atelectasis. A component of pneumonia should be suspected in the proper clinical setting. 2. Minor dependent left lower lobe and anterior inferior right upper lobe opacity that is likely atelectasis. 3. No pulmonary edema. 4. Small pericardial effusion. 5. Moderate centrilobular emphysema. Electronically Signed   By: Lajean Manes M.D.   On: 08/11/2016 16:59     Scheduled Meds: . amLODipine  2.5 mg Oral Daily  . atorvastatin  20 mg Oral q1800  . carbamazepine  200 mg Oral q morning - 10a   And  . carbamazepine  400 mg Oral QHS  . ceFEPime (MAXIPIME) IV  1 g Intravenous Q8H  . escitalopram  30 mg Oral Daily  . famotidine  20 mg Oral Daily  . heparin subcutaneous  5,000 Units Subcutaneous Q8H  . levothyroxine  75 mcg Oral QAC breakfast  . methylPREDNISolone (SOLU-MEDROL) injection  60 mg Intravenous BID  . metoprolol succinate  50 mg Oral Daily  . pantoprazole  40 mg Oral Q1200  . sodium chloride flush  3 mL Intravenous Q12H   Continuous Infusions: PRN Meds: sodium chloride, acetaminophen, ondansetron (ZOFRAN) IV, sodium chloride flush  Time spent: 30 minutes  Author: Berle Mull, MD [facility] Pager: [phone] 08/12/2016 3:57 PM  If 7PM-7AM, please contact night-coverage at [email or web address], password [facility]

## 2016-08-13 DIAGNOSIS — J189 Pneumonia, unspecified organism: Secondary | ICD-10-CM

## 2016-08-13 DIAGNOSIS — I5031 Acute diastolic (congestive) heart failure: Secondary | ICD-10-CM

## 2016-08-13 DIAGNOSIS — J9601 Acute respiratory failure with hypoxia: Secondary | ICD-10-CM

## 2016-08-13 LAB — CBC WITH DIFFERENTIAL/PLATELET
BASOS ABS: 0 10*3/uL (ref 0.0–0.1)
Basophils Relative: 0 %
EOS ABS: 0 10*3/uL (ref 0.0–0.7)
Eosinophils Relative: 0 %
HCT: 26.7 % — ABNORMAL LOW (ref 36.0–46.0)
Hemoglobin: 8.8 g/dL — ABNORMAL LOW (ref 12.0–15.0)
LYMPHS PCT: 26 %
Lymphs Abs: 2.6 10*3/uL (ref 0.7–4.0)
MCH: 29.1 pg (ref 26.0–34.0)
MCHC: 33 g/dL (ref 30.0–36.0)
MCV: 88.4 fL (ref 78.0–100.0)
Monocytes Absolute: 1.3 10*3/uL — ABNORMAL HIGH (ref 0.1–1.0)
Monocytes Relative: 13 %
NEUTROS ABS: 6 10*3/uL (ref 1.7–7.7)
NEUTROS PCT: 61 %
PLATELETS: 425 10*3/uL — AB (ref 150–400)
RBC: 3.02 MIL/uL — ABNORMAL LOW (ref 3.87–5.11)
RDW: 18.8 % — AB (ref 11.5–15.5)
WBC: 9.9 10*3/uL (ref 4.0–10.5)

## 2016-08-13 LAB — BASIC METABOLIC PANEL
ANION GAP: 12 (ref 5–15)
BUN: 43 mg/dL — ABNORMAL HIGH (ref 6–20)
CO2: 25 mmol/L (ref 22–32)
Calcium: 8.7 mg/dL — ABNORMAL LOW (ref 8.9–10.3)
Chloride: 101 mmol/L (ref 101–111)
Creatinine, Ser: 1.58 mg/dL — ABNORMAL HIGH (ref 0.44–1.00)
GFR calc Af Amer: 41 mL/min — ABNORMAL LOW (ref >60)
GFR, EST NON AFRICAN AMERICAN: 35 mL/min — AB (ref >60)
Glucose, Bld: 97 mg/dL (ref 65–99)
POTASSIUM: 3.6 mmol/L (ref 3.5–5.1)
SODIUM: 138 mmol/L (ref 135–145)

## 2016-08-13 LAB — MAGNESIUM: MAGNESIUM: 2.3 mg/dL (ref 1.7–2.4)

## 2016-08-13 LAB — PROCALCITONIN: Procalcitonin: 0.19 ng/mL

## 2016-08-13 MED ORDER — ACETAMINOPHEN 325 MG PO TABS
650.0000 mg | ORAL_TABLET | ORAL | Status: DC | PRN
  Administered 2016-08-14: 650 mg via ORAL
  Filled 2016-08-13: qty 2

## 2016-08-13 MED ORDER — DEXTROSE 5 % IV SOLN
2.0000 g | INTRAVENOUS | Status: DC
  Administered 2016-08-14 – 2016-08-15 (×2): 2 g via INTRAVENOUS
  Filled 2016-08-13 (×2): qty 2

## 2016-08-13 NOTE — Progress Notes (Signed)
Physical Therapy Treatment Patient Details Name: Anna [name] MRN: [id number] DOB: 24-Jun-1961 Today's Date: 08/13/2016    History of Present Illness 56 yo female wtih onset of hypoxia, acute encephalopathy, flat troponins and pulm edema with SOB and AKI was admitted and noted to have acute CHF.  PMHx:  stroke, Lupus, HTN, Depression, hypothyroidism, smoker.     PT Comments    Pt presented supine in bed with HOB elevated, awake and willing to participate in therapy session. Pt ambulated ~100' with RW and 6L of O2 via [place] with SPO2 maintaining >90%. Pt would continue to benefit from skilled physical therapy services at this time while admitted and after d/c to address her below listed limitations in order to improve her overall safety and independence with functional mobility.    Follow Up Recommendations  SNF     Equipment Recommendations  Rolling walker with 5" wheels    Recommendations for Other Services       Precautions / Restrictions Precautions Precautions: Fall Precaution Comments: watch 02 sats Restrictions Weight Bearing Restrictions: No    Mobility  Bed Mobility Overal bed mobility: Modified Independent                Transfers Overall transfer level: Needs assistance Equipment used: Rolling walker (2 wheeled) Transfers: Sit to/from Stand Sit to Stand: Min guard         General transfer comment: increased time, min guard for safety  Ambulation/Gait Ambulation/Gait assistance: Min guard;Mod assist Ambulation Distance (Feet): 100 Feet Assistive device: Rolling walker (2 wheeled) Gait Pattern/deviations: Step-through pattern;Decreased step length - right;Decreased step length - left;Decreased stride length Gait velocity: decreased Gait velocity interpretation: Below normal speed for age/gender General Gait Details: pt required constant min guard for safety. pt with significant LOB posterior during ambulation that required max A to maintain upright  standing position. Pt reported she felt as though her "grandmother's spirit" pushed her   Stairs            Wheelchair Mobility    Modified Rankin (Stroke Patients Only)       Balance Overall balance assessment: Needs assistance Sitting-balance support: Feet supported Sitting balance-Leahy Scale: Good     Standing balance support: During functional activity;Bilateral upper extremity supported Standing balance-Leahy Scale: Poor                      Cognition Arousal/Alertness: Awake/alert Behavior During Therapy: WFL for tasks assessed/performed Overall Cognitive Status: Impaired/Different from baseline Area of Impairment: Safety/judgement         Safety/Judgement: Decreased awareness of safety;Decreased awareness of deficits          Exercises      General Comments        Pertinent Vitals/Pain Pain Assessment: No/denies pain    Home Living                      Prior Function            PT Goals (current goals can now be found in the care plan section) Acute Rehab PT Goals Patient Stated Goal: to go to rehab prior to returning home PT Goal Formulation: With patient/family Time For Goal Achievement: 08/19/16 Potential to Achieve Goals: Good Progress towards PT goals: Progressing toward goals    Frequency    Min 3X/week      PT Plan Current plan remains appropriate    Co-evaluation  End of Session Equipment Utilized During Treatment: Gait belt;Oxygen Activity Tolerance: Patient tolerated treatment well Patient left: in bed;with call bell/phone within reach;Other (comment) (sitting EOB with nurse tech present)     Time: 3912-2583 PT Time Calculation (min) (ACUTE ONLY): 24 min  Charges:  $Gait Training: 23-37 mins                    G CodesClearnce Sorrel Evan Mackie Aug 27, 2016, 9:31 AM Sherie Don, PT, DPT [phone]

## 2016-08-13 NOTE — Progress Notes (Signed)
PHARMACY NOTE:  ANTIMICROBIAL RENAL DOSAGE ADJUSTMENT  Current antimicrobial regimen includes a mismatch between antimicrobial dosage and estimated renal function.  As per policy approved by the Pharmacy & Therapeutics and Medical Executive Committees, the antimicrobial dosage will be adjusted accordingly.  Current antimicrobial dosage:  Cefepime 1 gram IV every 8 hours  Indication: PNA  Renal Function:  Estimated Creatinine Clearance: 44.5 mL/min (by C-G formula based on SCr of 1.58 mg/dL (H)). []      On intermittent HD, scheduled: []      On CRRT    Antimicrobial dosage has been changed to:  Cefepime 2 gram IV every 24 hours   Thank you for allowing pharmacy to be a part of this patient's care.  Vincenza Hews, PharmD, BCPS 08/13/2016, 11:20 AM

## 2016-08-13 NOTE — Progress Notes (Signed)
Occupational Therapy Treatment Patient Details Name: Anna [name] MRN: [id number] DOB: 02-09-1961 Today's Date: 08/13/2016    History of present illness 56 yo female wtih onset of hypoxia, acute encephalopathy, flat troponins and pulm edema with SOB and AKI was admitted and noted to have acute CHF.  PMHx:  stroke, Lupus, HTN, Depression, hypothyroidism, smoker.    OT comments  Pt continues to demonstrate impaired safety, memory and awareness of deficits. Performed dressing, toileting and one activity standing at sink with min to min guard assist and use of RW. Pt maintained SPO2 above 90% with activity on 6L. Began instruction in energy conservation and educated in pursed lip breathing techniques. Pt's husband works and she will require 24 hour supervision for safety. Recommending ST rehab in SNF.  Follow Up Recommendations  SNF;Supervision/Assistance - 24 hour (Pt does not have 24 hour care at home.)    Equipment Recommendations  Tub/shower bench    Recommendations for Other Services      Precautions / Restrictions Precautions Precautions: Fall Precaution Comments: watch 02 sats Restrictions Weight Bearing Restrictions: No       Mobility Bed Mobility              General bed mobility comments: pt in chair  Transfers Overall transfer level: Needs assistance Equipment used: Rolling walker (2 wheeled) Transfers: Sit to/from Stand Sit to Stand: Min guard         General transfer comment: increased time, min guard for safety    Balance Overall balance assessment: Needs assistance Sitting-balance support: Feet supported Sitting balance-Leahy Scale: Good     Standing balance support: During functional activity;Bilateral upper extremity supported Standing balance-Leahy Scale: Poor                     ADL Overall ADL's : Needs assistance/impaired     Grooming: Wash/dry hands;Standing;Minimal assistance           Upper Body Dressing : Minimal  assistance;Sitting   Lower Body Dressing: Set up;Sitting/lateral leans Lower Body Dressing Details (indicate cue type and reason): donned and doffed socks Toilet Transfer: Minimal assistance;RW;Comfort height toilet Toilet Transfer Details (indicate cue type and reason): needed assist to manage walker in tight space, poor awareness of lines with ambulation Toileting- Clothing Manipulation and Hygiene: Minimal assistance;Sit to/from stand Toileting - Clothing Manipulation Details (indicate cue type and reason): assist to keep front opening gown out of toilet     Functional mobility during ADLs: Minimal assistance;Rolling walker;Min guard General ADL Comments: Improved attention and task persistence.      Vision                     Perception     Praxis      Cognition   Behavior During Therapy: [facility] for tasks assessed/performed Overall Cognitive Status: Impaired/Different from baseline Area of Impairment: Memory;Safety/judgement     Memory: Decreased short-term memory    Safety/Judgement: Decreased awareness of safety;Decreased awareness of deficits     General Comments: Pt does not recall events of 2 days ago.    Extremity/Trunk Assessment               Exercises     Shoulder Instructions       General Comments      Pertinent Vitals/ Pain       Pain Assessment: No/denies pain  Home Living  Prior Functioning/Environment              Frequency  Min 2X/week        Progress Toward Goals  OT Goals(current goals can now be found in the care plan section)  Progress towards OT goals: Progressing toward goals  Acute Rehab OT Goals Patient Stated Goal: to return home Time For Goal Achievement: 08/20/16 Potential to Achieve Goals: Good  Plan Discharge plan needs to be updated    Co-evaluation                 End of Session Equipment Utilized During Treatment: Gait  belt;Rolling walker;Oxygen (6L)   Activity Tolerance Patient tolerated treatment well   Patient Left in chair;with call bell/phone within reach;with family/visitor present   Nurse Communication          Time: 6433-2951 OT Time Calculation (min): 20 min  Charges: OT General Charges $OT Visit: 1 Procedure OT Treatments $Self Care/Home Management : 8-22 mins  Malka So 08/13/2016, 11:06 AM [phone]

## 2016-08-13 NOTE — Progress Notes (Addendum)
PROGRESS NOTE  Anna [name]  HQI:[id number] DOB: 1960/10/10  DOA: 08/04/2016 PCP: [name], MD   Brief Narrative:  This 56-year-old female with PMH of SLE, hypothyroid, HTN, HLD, stroke, depression, chronic CHF, presented to ED on 08/04/16 with once history of progressive malaise, generalized weakness and acute worsening of dyspnea and mental status changes. On initial admission, she was afebrile, hypoxic at 80% on room air. Admitted initially for acute encephalopathy, acute hypoxic respiratory failure, decompensated CHF and community-acquired pneumonia. Prolonged hospital course. Mental status changes improved or even resolved. Neurology signed off. PCCM assisting with management of hypoxia, HCAP and large R pleural effusion.   Assessment & Plan:   Principal Problem:   Acute diastolic CHF (congestive heart failure) (HCC) Active Problems:   SLE (systemic lupus erythematosus) (HCC)   Depression   Hypothyroid   Essential hypertension, benign   GERD (gastroesophageal reflux disease)   AKI (acute kidney injury) ([place])   Anemia   Acute respiratory failure with hypoxia (HCC)   Acute encephalopathy   Long-term use of Plaquenil   SIRS (systemic inflammatory response syndrome) (HCC)   HCAP (healthcare-associated pneumonia)   Pleural effusion   1. Acute hypoxic respiratory failure: She was treated empirically for community-acquired pneumonia and with Solu-Medrol for potential autoimmune contribution to encephalopathy, effusions of both. Antibiotics for CAP were discontinued on 08/08/16. However on 2/3, she developed leukocytosis (10.9 > 22.5) and HCAP coverage was initiated. Chest x-ray 2/3 showed findings concerning for right-sided pleural effusion with associated atelectasis. IR performed thoracentesis yielding 40 mL of thick pleural fluid and procedure discontinued due to suspected loculations. Pleural fluid was exudative. Pulmonary consultation and follow-up appreciated and indicate that  follow-up CT shows underlying infiltrate likely consistent with infection and residual pleural fluid that does not appear loculated. There recommend checking for lupus activity, continued antibiotics and follow clinically. If worsening fevers and chest x-ray, then needs to have TCTS evaluate. Currently on 6 L HFNC O2. Blood cultures, urine cultures, pleural fluid cultures negative to date.  2. Large right exudative pleural effusion: Management as per problem #1.   3. Acute on chronic diastolic CHF: Status post several days of IV Lasix. Clinically euvolemic. 2-D echo 08/06/16: LVEF 60-65 percent and grade 2 diastolic dysfunction.  4. Acute encephalopathy: CT & MRI head, TSH, free T4, B12, ammonia, CSF VDRL unremarkable. Initial concern for lupus related encephalopathy and started on IV Solu-Medrol. CSF without evidence of inflammation. Subsequently polypharmacy felt to be the most likely cause. Significantly improved. Mental status may have normalized. Tapering Solu-Medrol. Neurology signed off note 08/08/16 appreciated.  5. Normocytic anemia: Relatively stable. Status post dose of B12 for low B12 and IV iron.  6. Hypothyroid: Continue Synthroid.  7. SLE: On Plaquenil and methotrexate at home >currently held in the context of suspected infection. On IV Solu-Medrol.  8. Depression: Medications are on hold secondary to acute encephalopathy. However remains on Lexapro.  9. Essential hypertension: Controlled. Continue metoprolol and amlodipine.  10. Leukocytosis: Resolved.  11. Acute kidney injury versus stage III chronic kidney disease: Last creatinine prior to this admission on 05/11/15:1.01. Admitted with creatinine of 1.58. Uncertain as to baseline creatinine over the 2 years prior. May well have progressed. Creatinine has fluctuated during the hospital stay but 1.5 may be at baseline. Follow creatinine.  12. Hyperlipidemia: Continue statins. Follow LFTs in a.m.   DVT prophylaxis:  subcutaneous  heparin Code Status:  full Family Communication:  none at bedside Disposition Plan:  continue management in stepdown unit. Discharge  disposition to be determined when she has medically improved and stable.   Consultants:   PCCM  Interventional Radiology  neurology.  Procedures:    Ultrasound-guided right thoracentesis by IR on 08/11/16  Fluoroscopic guided LP by IR on 08/06/16  2-D echo 08/06/16: Study Conclusions  - Left ventricle: The cavity size was normal. Wall thickness was   increased in a pattern of mild LVH. Systolic function was normal.   The estimated ejection fraction was in the range of 60% to 65%.   Wall motion was normal; there were no regional wall motion   abnormalities. Features are consistent with a pseudonormal left   ventricular filling pattern, with concomitant abnormal relaxation   and increased filling pressure (grade 2 diastolic dysfunction). - Mitral valve: There was mild regurgitation. Valve area by   pressure half-time: 1.72 cm^2. - Left atrium: The atrium was moderately dilated. - Pulmonary arteries: Systolic pressure was mildly increased. PA   peak pressure: 39 mm Hg (S). - Pericardium, extracardiac: A trivial pericardial effusion was   identified.  Antimicrobials:    Azithromycin 1/29 > 1/31  IV ceftriaxone 1/29 > 1/31  IV cefepime 2/3 >  IV vancomycin 2/3 > 2/5   Subjective:  Feels better. Dyspnea improved but not resolved. No chest pain or cough reported. Denies any other complaints.  Objective:  Vitals:   08/13/16 0800 08/13/16 1149 08/13/16 1450 08/13/16 1600  BP:  128/89 125/76   Pulse: 74 70 69 73  Resp: (!) 23 (!) 23 20 (!) 23  Temp:  98.3 F (36.8 C) 98.7 F (37.1 C)   TempSrc:  Oral Oral   SpO2: 94% 100% 97% 100%  Weight:      Height:        Intake/Output Summary (Last 24 hours) at 08/13/16 1622 Last data filed at 08/13/16 1610  Gross per 24 hour  Intake              223 ml  Output                0 ml  Net               223 ml   Filed Weights   08/11/16 0307 08/12/16 0316 08/13/16 0452  Weight: 88 kg (194 lb 0.1 oz) 88 kg (194 lb 0.1 oz) 87 kg (191 lb 12.8 oz)    Examination:  General exam: Pleasant middle-aged female sitting up comfortably in bed without distress. Respiratory system:  reduced breath sounds in the bases right >left. Rest of lung fields clear to auscultation. Respiratory effort normal. Cardiovascular system: S1 & S2 heard, RRR. No JVD, murmurs, rubs, gallops or clicks. No pedal edema. telemetry: Sinus rhythm.  Gastrointestinal system: Abdomen is nondistended, soft and nontender. No organomegaly or masses felt. Normal bowel sounds heard. Central nervous system: Alert and oriented. No focal neurological deficits. Extremities: Symmetric 5 x 5 power. Skin: No rashes, lesions or ulcers Psychiatry: Judgement and insight appear normal. Mood & affect appropriate.     Data Reviewed: I have personally reviewed following labs and imaging studies  CBC:  Recent Labs Lab 08/08/16 1004 08/09/16 0330 08/10/16 0059 08/11/16 0819 08/12/16 0354 08/13/16 0651  WBC 10.9* 22.5* 36.8* 25.7* 15.5* 9.9  NEUTROABS 6.9  --  30.9* 22.4* 13.8* 6.0  HGB 10.5* 10.7* 9.3* 9.5* 9.1* 8.8*  HCT 29.9* 31.9* 27.4* 27.3* 27.3* 26.7*  MCV 88.7 87.9 88.7 89.5 88.3 88.4  PLT 556* 509* 439* 437* 416* 425*   Basic  Metabolic Panel:  Recent Labs Lab 08/09/16 0330 08/10/16 0059 08/11/16 0141 08/12/16 0354 08/13/16 0651  NA 138 139 140 137 138  K 4.4 4.2 4.2 4.1 3.6  CL 103 103 104 100* 101  CO2 25 26 24 24 25   GLUCOSE 142* 134* 117* 143* 97  BUN 25* 30* 44* 53* 43*  CREATININE 1.23* 1.33* 1.62* 1.78* 1.58*  CALCIUM 8.7* 8.6* 8.7* 8.8* 8.7*  MG  --  2.1 2.2 2.3 2.3   GFR: Estimated Creatinine Clearance: 44.5 mL/min (by C-G formula based on SCr of 1.58 mg/dL (H)). Liver Function Tests:  Recent Labs Lab 08/11/16 1601 08/12/16 0354  AST  --  113*  ALT  --  75*  ALKPHOS  --  70  BILITOT   --  0.4  PROT 6.7 6.7  ALBUMIN  --  2.1*   No results for input(s): LIPASE, AMYLASE in the last 168 hours. No results for input(s): AMMONIA in the last 168 hours. Coagulation Profile: No results for input(s): INR, PROTIME in the last 168 hours. Cardiac Enzymes:  Recent Labs Lab 08/09/16 1937 08/10/16 0059 08/10/16 0629  TROPONINI 0.05* 0.03* 0.05*   BNP (last 3 results) No results for input(s): PROBNP in the last 8760 hours. HbA1C: No results for input(s): HGBA1C in the last 72 hours. CBG: No results for input(s): GLUCAP in the last 168 hours. Lipid Profile: No results for input(s): CHOL, HDL, LDLCALC, TRIG, CHOLHDL, LDLDIRECT in the last 72 hours. Thyroid Function Tests: No results for input(s): TSH, T4TOTAL, FREET4, T3FREE, THYROIDAB in the last 72 hours. Anemia Panel: No results for input(s): VITAMINB12, FOLATE, FERRITIN, TIBC, IRON, RETICCTPCT in the last 72 hours.  Sepsis Labs:  Recent Labs Lab 08/09/16 1825 08/10/16 0059 08/11/16 0819 08/11/16 1601 08/12/16 0354 08/13/16 0651  PROCALCITON  --   --   --  0.36  --  0.19  WBC  --  36.8* 25.7*  --  15.5* 9.9  LATICACIDVEN 1.4  --   --   --   --   --     Recent Results (from the past 240 hour(s))  Culture, blood (Routine x 2)     Status: None   Collection Time: 08/04/16  4:33 PM  Result Value Ref Range Status   Specimen Description BLOOD LEFT HAND  Final   Special Requests IN PEDIATRIC BOTTLE  3CC  Final   Culture NO GROWTH 5 DAYS  Final   Report Status 08/09/2016 FINAL  Final  Culture, blood (Routine x 2)     Status: None   Collection Time: 08/04/16  4:57 PM  Result Value Ref Range Status   Specimen Description BLOOD LEFT ARM  Final   Special Requests BOTTLES DRAWN AEROBIC AND ANAEROBIC  5CC  Final   Culture NO GROWTH 5 DAYS  Final   Report Status 08/09/2016 FINAL  Final  Urine culture     Status: None   Collection Time: 08/04/16  8:00 PM  Result Value Ref Range Status   Specimen Description URINE,  CATHETERIZED  Final   Special Requests NONE  Final   Culture NO GROWTH  Final   Report Status 08/06/2016 FINAL  Final  CSF culture     Status: None   Collection Time: 08/06/16  9:37 AM  Result Value Ref Range Status   Specimen Description CSF  Final   Special Requests TUBE 2  Final   Gram Stain CYTOSPIN SMEAR NO WBC SEEN NO ORGANISMS SEEN   Final   Culture  NO GROWTH 3 DAYS  Final   Report Status 08/09/2016 FINAL  Final  Culture, blood (routine x 2)     Status: None (Preliminary result)   Collection Time: 08/09/16  6:34 PM  Result Value Ref Range Status   Specimen Description BLOOD RIGHT ANTECUBITAL  Final   Special Requests BOTTLES DRAWN AEROBIC AND ANAEROBIC  10CC EA  Final   Culture NO GROWTH 4 DAYS  Final   Report Status PENDING  Incomplete  Culture, blood (routine x 2)     Status: None (Preliminary result)   Collection Time: 08/09/16  6:36 PM  Result Value Ref Range Status   Specimen Description BLOOD RIGHT HAND  Final   Special Requests BOTTLES DRAWN AEROBIC AND ANAEROBIC 5CC EA  Final   Culture NO GROWTH 4 DAYS  Final   Report Status PENDING  Incomplete  Respiratory Panel by PCR     Status: None   Collection Time: 08/10/16  8:18 AM  Result Value Ref Range Status   Adenovirus NOT DETECTED NOT DETECTED Final   Coronavirus 229E NOT DETECTED NOT DETECTED Final   Coronavirus HKU1 NOT DETECTED NOT DETECTED Final   Coronavirus NL63 NOT DETECTED NOT DETECTED Final   Coronavirus OC43 NOT DETECTED NOT DETECTED Final   Metapneumovirus NOT DETECTED NOT DETECTED Final   Rhinovirus / Enterovirus NOT DETECTED NOT DETECTED Final   Influenza A NOT DETECTED NOT DETECTED Final   Influenza B NOT DETECTED NOT DETECTED Final   Parainfluenza Virus 1 NOT DETECTED NOT DETECTED Final   Parainfluenza Virus 2 NOT DETECTED NOT DETECTED Final   Parainfluenza Virus 3 NOT DETECTED NOT DETECTED Final   Parainfluenza Virus 4 NOT DETECTED NOT DETECTED Final   Respiratory Syncytial Virus NOT DETECTED  NOT DETECTED Final   Bordetella pertussis NOT DETECTED NOT DETECTED Final   Chlamydophila pneumoniae NOT DETECTED NOT DETECTED Final   Mycoplasma pneumoniae NOT DETECTED NOT DETECTED Final  Culture, Urine     Status: None   Collection Time: 08/10/16  5:37 PM  Result Value Ref Range Status   Specimen Description URINE, CLEAN CATCH  Final   Special Requests NONE  Final   Culture NO GROWTH  Final   Report Status 08/11/2016 FINAL  Final  MRSA PCR Screening     Status: None   Collection Time: 08/11/16  8:08 AM  Result Value Ref Range Status   MRSA by PCR NEGATIVE NEGATIVE Final    Comment:        The GeneXpert MRSA Assay (FDA approved for NASAL specimens only), is one component of a comprehensive MRSA colonization surveillance program. It is not intended to diagnose MRSA infection nor to guide or monitor treatment for MRSA infections.   Culture, body fluid-bottle     Status: None (Preliminary result)   Collection Time: 08/11/16 12:23 PM  Result Value Ref Range Status   Specimen Description PLEURAL RIGHT  Final   Special Requests NONE  Final   Culture NO GROWTH 2 DAYS  Final   Report Status PENDING  Incomplete  Gram stain     Status: None   Collection Time: 08/11/16 12:23 PM  Result Value Ref Range Status   Specimen Description PLEURAL RIGHT  Final   Special Requests NONE  Final   Gram Stain   Final    RARE WBC PRESENT, PREDOMINANTLY PMN NO ORGANISMS SEEN    Report Status 08/11/2016 FINAL  Final         Radiology Studies: Ct Chest Wo Contrast  Result Date: 08/11/2016 CLINICAL DATA:  Cough.  Right pleural effusion. EXAM: CT CHEST WITHOUT CONTRAST TECHNIQUE: Multidetector CT imaging of the chest was performed following the standard protocol without IV contrast. COMPARISON:  Current chest radiograph and prior chest radiographs. FINDINGS: Cardiovascular: Heart is normal in size. There is a small pericardial effusion. The great vessels are normal in caliber. No aortic or  coronary artery atherosclerotic calcifications. Mediastinum/Nodes: No neck base or axillary masses or adenopathy. No mediastinal or hilar masses or enlarged lymph nodes. Esophagus, trachea and mainstem bronchi are unremarkable. Lungs/Pleura: Moderate size right pleural effusion. There is dependent opacity within the right lower lobe and at the base of the right middle lobe. This is consistent with either atelectasis, pneumonia or a combination. Small area of opacity is noted along the pleural surface of the anteromedial right upper lobe near the minor fissure, also likely atelectasis. There is dependent opacity in the left lower lobe associated with mild bronchiolectasis. The opacity is likely atelectasis. No evidence of pulmonary edema. There are moderate changes of emphysema most evident in the upper lobes. No pneumothorax.  No left pleural effusion. Upper Abdomen: No acute abnormality. Musculoskeletal: No chest wall mass or suspicious bone lesions identified. IMPRESSION: 1. Moderate right pleural effusion. There is dependent right lower lobe and, to lesser degree, right middle lobe opacity. This may all be atelectasis. A component of pneumonia should be suspected in the proper clinical setting. 2. Minor dependent left lower lobe and anterior inferior right upper lobe opacity that is likely atelectasis. 3. No pulmonary edema. 4. Small pericardial effusion. 5. Moderate centrilobular emphysema. Electronically Signed   By: Lajean Manes M.D.   On: 08/11/2016 16:59        Scheduled Meds: . amLODipine  2.5 mg Oral Daily  . atorvastatin  20 mg Oral q1800  . carbamazepine  200 mg Oral q morning - 10a   And  . carbamazepine  400 mg Oral QHS  . [START ON 08/14/2016] ceFEPime (MAXIPIME) IV  2 g Intravenous Q24H  . escitalopram  30 mg Oral Daily  . famotidine  20 mg Oral Daily  . heparin subcutaneous  5,000 Units Subcutaneous Q8H  . levothyroxine  75 mcg Oral QAC breakfast  . methylPREDNISolone (SOLU-MEDROL)  injection  40 mg Intravenous Daily  . metoprolol succinate  50 mg Oral Daily  . pantoprazole  40 mg Oral Q1200  . sodium chloride flush  3 mL Intravenous Q12H   Continuous Infusions:   LOS: 9 days       Maksym Pfiffner, MD [facility] Pager [phone]  If 7PM-7AM, please contact night-coverage [email or web address] Password [email or web address] 08/13/2016, 4:22 PM

## 2016-08-13 NOTE — Progress Notes (Signed)
Name: Anna [name] MRN: [id number] DOB: [birth year]    ADMISSION DATE:  08/04/2016 CONSULTATION DATE:  08/11/16  REFERRING MD :  Dr. Posey Pronto   CHIEF COMPLAINT:  Pleural Effusion    SUMMARY:  56 y/o F admitted 1/29 with a 2 month hx of progressive malaise, generalized weakness, confusion and increasing dyspnea.    She was admitted per [facility] for further evaluation.  Initial work up was concerning for acute hypoxic respiratory failure in the setting of decompensated CHF, AKI, anemia and acute encephalopathy.  CXR at that time was concerning for bilateral pleural effusions & cardiomegaly.  Non-contrast CT of the head was negative for intracranial abnormalities.  She was empirically treated for possible pneumonia with rocephin / azithromycin and lasix.  Flu swab assessed and negative.    Overnight 1/30, there were concerns for hypoxia and altered mental status but the patient was stable per documentation.  Work up for altered mental status included an MRI of the brain which was an incomplete study due to motion, obtained sequences were unremarkable.  She was started on solumedrol with her hx of lupus.  Neurology was consulted.  UDS was positive for benzodiazepines, she was pan cultured.   Free tegretol level 2.8 (wnl).  Ammonia level 22, HIV negative.  IR was consulted for an LP.  RPR pending.  ABG demonstrated compensated hypercarbia.  Cultures to date are negative.  There was question that she may have accidentally overdosed on her medications.    ABX for CAP were discontinued on 2/2.  However on 2/3, she had a rise in her WBC from 10.9 to 22.5 with concern for possible HCAP.  ABX were restarted for HCAP coverage.  CXR 2/3 showed a large dense opacity in the R mid and lower lung concerning for possible effusion with associated atelectasis.  She was further evaluated by IR and a thoracentesis was completed which yeilded 40 ml of thick pleural fluid > procedure had to be stopped due to apparent loculations  / thickness of fluid.  Pleural fluid was exudative by lights criteria (LDH 982), WBC 712.    PCCM consulted to evaluate pleural effusion.   PMHx:  CHF, HTN, HLD, depression, lupus (on plaqenil & methotrexate) and CVA   SUBJECTIVE: Pt denies SOB, fevers, chills.   VITAL SIGNS: Temp:  [98.1 F (36.7 C)-98.6 F (37 C)] 98.3 F (36.8 C) (02/07 1149) Pulse Rate:  [60-74] 70 (02/07 1149) Resp:  [18-25] 23 (02/07 1149) BP: (126-150)/(69-89) 128/89 (02/07 1149) SpO2:  [90 %-100 %] 100 % (02/07 1149) Weight:  [191 lb 12.8 oz (87 kg)] 191 lb 12.8 oz (87 kg) (02/07 0452)  PHYSICAL EXAMINATION: General:  Obese female in NAD HEENT: MM pink/moist, no jvd PSY: calm  Neuro: Awake, alert, more oriented, speech clear CV: s1s2 rrr, no m/r/g PULM: even/non-labored, lungs bilaterally diminished right >L BM:WUXL, non-tender, bsx4 active  Extremities: warm/dry, no edema  Skin: no rashes or lesions     Recent Labs Lab 08/11/16 0141 08/12/16 0354 08/13/16 0651  NA 140 137 138  K 4.2 4.1 3.6  CL 104 100* 101  CO2 _0 BUN 44* 53* 43*  CREATININE 1.62* 1.78* 1.58*  GLUCOSE 117* 143* 97     Recent Labs Lab 08/11/16 0819 08/12/16 0354 08/13/16 0651  HGB 9.5* 9.1* 8.8*  HCT 27.3* 27.3* 26.7*  WBC 25.7* 15.5* 9.9  PLT 437* 416* 425*    Dg Chest 1 View  Result Date: 08/11/2016 CLINICAL DATA:  Post  thoracentesis, history lupus, hypertension, CHF, stroke EXAM: CHEST 1 VIEW COMPARISON:  08/10/2016 FINDINGS: Enlargement of cardiac silhouette with slight vascular congestion. Stable mediastinal contours. RIGHT pleural effusion and basilar atelectasis identified. No pneumothorax. Mild streaky LEFT atelectasis LEFT lower lobe. Bones unremarkable. IMPRESSION: No pneumothorax following thoracentesis. Persistent RIGHT basilar atelectasis and effusion. Electronically Signed   By: Lavonia Dana M.D.   On: 08/11/2016 12:34   Ct Chest Wo Contrast  Result Date: 08/11/2016 CLINICAL DATA:  Cough.   Right pleural effusion. EXAM: CT CHEST WITHOUT CONTRAST TECHNIQUE: Multidetector CT imaging of the chest was performed following the standard protocol without IV contrast. COMPARISON:  Current chest radiograph and prior chest radiographs. FINDINGS: Cardiovascular: Heart is normal in size. There is a small pericardial effusion. The great vessels are normal in caliber. No aortic or coronary artery atherosclerotic calcifications. Mediastinum/Nodes: No neck base or axillary masses or adenopathy. No mediastinal or hilar masses or enlarged lymph nodes. Esophagus, trachea and mainstem bronchi are unremarkable. Lungs/Pleura: Moderate size right pleural effusion. There is dependent opacity within the right lower lobe and at the base of the right middle lobe. This is consistent with either atelectasis, pneumonia or a combination. Small area of opacity is noted along the pleural surface of the anteromedial right upper lobe near the minor fissure, also likely atelectasis. There is dependent opacity in the left lower lobe associated with mild bronchiolectasis. The opacity is likely atelectasis. No evidence of pulmonary edema. There are moderate changes of emphysema most evident in the upper lobes. No pneumothorax.  No left pleural effusion. Upper Abdomen: No acute abnormality. Musculoskeletal: No chest wall mass or suspicious bone lesions identified. IMPRESSION: 1. Moderate right pleural effusion. There is dependent right lower lobe and, to lesser degree, right middle lobe opacity. This may all be atelectasis. A component of pneumonia should be suspected in the proper clinical setting. 2. Minor dependent left lower lobe and anterior inferior right upper lobe opacity that is likely atelectasis. 3. No pulmonary edema. 4. Small pericardial effusion. 5. Moderate centrilobular emphysema. Electronically Signed   By: Lajean Manes M.D.   On: 08/11/2016 16:59   US Thoracentesis Asp Pleural Space W/img Guide  Result Date:  08/11/2016 INDICATION: Patient with weakness and worsening dyspnea found to have a right pleural effusion. Request is made for diagnostic and therapeutic thoracentesis. EXAM: ULTRASOUND GUIDED DIAGNOSTIC AND THERAPEUTIC RIGHT THORACENTESIS MEDICATIONS: 10 mL 1% lidocaine COMPLICATIONS: None immediate. PROCEDURE: An ultrasound guided thoracentesis was thoroughly discussed with the patient and questions answered. The benefits, risks, alternatives and complications were also discussed. The patient understands and wishes to proceed with the procedure. Written consent was obtained. Ultrasound was performed to localize and mark an adequate pocket of fluid in the right chest. The area was then prepped and draped in the normal sterile fashion. 1% Lidocaine was used for local anesthesia. Under ultrasound guidance a Safe-T-centesis was introduced. After 16m of fluid removed, resistance was met. The Safe-T-centesis catheter was removed. A 19 gauge, 7-cm, Yueh catheter was then introduced with 5 mL fluid removed, but again significant resistance met when drawing fluid. Procedure was stopped. The catheter was removed and a dressing applied. FINDINGS: A total of approximately 40 mL of hazy, yellow fluid was removed. Samples were sent to the laboratory as requested by the clinical team. IMPRESSION: Successful ultrasound guided diagnostic and therapeutic right thoracentesis yielding 40 mL of pleural fluid. Read by:  KBrynda GreathousePA-C Electronically Signed   By: JCorrie MckusickD.O.   On: 08/11/2016 12:30  SIGNIFICANT EVENTS  1/29  Admit with AMS, weakness 2/03  Significant increase in WBC, cxr with effusion  2/05  IR thora with thick pleural fluid.  PCCM consulted for evaluation of pleural effusion   STUDIES:  CT Chest 2/5 >>     ASSESSMENT / PLAN:  Discussion: 56 y/o F with SLE on immunosuppression admitted for altered MS / confusion for 2 months.  Admitted this occasion for hypoxemia and found to have  bilateral plerual effusions.  She was treated empirically for PNA and solu-medrol for potential auto-immune contribution to encephalopathy, effusions or both. ABX broadened 2/3.  IR attempted thoracentesis 2/4 and was low yield, [place] eval revealed septations and complicated pleural space.    Large Right Exudative Pleural Effusion - exudative by LDH, ddx includes possible parapneumonic effusion in the setting of immunocompromise (methotrexate / plaquenil for lupus), empyema and less likely effusion from CHF.  Also must consider autoimmune related / lupus.  Unable to adequately drain via thoracentesis per IR.  Concerning she may need surgical evaluation but on CT chest effusion appears free flowing.   Plan: Continue O2 for sats 90-95% Follow clinically for now, may need CVTS if increase in size, pain or fevers Consider reassessment with [place] pending CXR review Husband would need to be involved as she is unable to make decisions at the moment Follow CXR  Trend WBC, fever curve Diuresis as renal function / BP permit Minimize sedating medications Vanco / Cefepime D4/x  Reduce steroids as able (started by [facility] for hx of lupus) Continue PPI  Aspiration precautions  Send DS-DNA, ANA, C3/C4, CH50 to assess lupus activity  Noe Gens, NP-C [facility] Pgr: [phone] or if no answer [phone] 08/13/2016, 12:25 PM  STAFF NOTE: I, Merrie Roof, MD FACP have personally reviewed patient's available data, including medical history, events of note, physical examination and test results as part of my evaluation. I have discussed with resident/NP and other care providers such as pharmacist, RN and RRT. In addition, I personally evaluated patient and elicited key findings of: awake, alert, reduced BS rt, no fevers, does not appear toxic, s/p thora exudative process, in setting lupus and likely infectious PNA,  The effusion was drained and repeat CT noted now showing underlying infiltrate  likely c/w infection, although also shows more of a residual simple fluid left over, not loculated, also with some INt changes in setting lupus, need to assess lupus activity (blood work sent), she does not appear toxic and CT now appears simple not loculated, would follow clinically for fever curve and pcxr and if increasing or fevers would then need to have cvts assessment, for now clinically follow pt, I updated family and pt in room  Lavon Paganini. Titus Mould, MD, [place] Pgr: [facility] 08/13/2016 2:06 PM

## 2016-08-14 DIAGNOSIS — I10 Essential (primary) hypertension: Secondary | ICD-10-CM

## 2016-08-14 LAB — COMPREHENSIVE METABOLIC PANEL
ALT: 46 U/L (ref 14–54)
AST: 33 U/L (ref 15–41)
Albumin: 2.1 g/dL — ABNORMAL LOW (ref 3.5–5.0)
Alkaline Phosphatase: 69 U/L (ref 38–126)
Anion gap: 9 (ref 5–15)
BUN: 37 mg/dL — AB (ref 6–20)
CHLORIDE: 104 mmol/L (ref 101–111)
CO2: 23 mmol/L (ref 22–32)
Calcium: 8.5 mg/dL — ABNORMAL LOW (ref 8.9–10.3)
Creatinine, Ser: 1.39 mg/dL — ABNORMAL HIGH (ref 0.44–1.00)
GFR calc Af Amer: 48 mL/min — ABNORMAL LOW (ref >60)
GFR, EST NON AFRICAN AMERICAN: 41 mL/min — AB (ref >60)
Glucose, Bld: 93 mg/dL (ref 65–99)
POTASSIUM: 3.7 mmol/L (ref 3.5–5.1)
SODIUM: 136 mmol/L (ref 135–145)
Total Bilirubin: 0.7 mg/dL (ref 0.3–1.2)
Total Protein: 6.1 g/dL — ABNORMAL LOW (ref 6.5–8.1)

## 2016-08-14 LAB — CULTURE, BLOOD (ROUTINE X 2)
CULTURE: NO GROWTH
Culture: NO GROWTH

## 2016-08-14 LAB — C4 COMPLEMENT: Complement C4, Body Fluid: 15 mg/dL (ref 14–44)

## 2016-08-14 LAB — ANTI-DNA ANTIBODY, DOUBLE-STRANDED: DS DNA AB: 55 [IU]/mL — AB (ref 0–9)

## 2016-08-14 LAB — ANTI-SCLERODERMA ANTIBODY: Scleroderma (Scl-70) (ENA) Antibody, IgG: <0.2 AI (ref 0.0–0.9)

## 2016-08-14 LAB — C3 COMPLEMENT: C3 Complement: 139 mg/dL (ref 82–167)

## 2016-08-14 LAB — COMPLEMENT, TOTAL

## 2016-08-14 NOTE — Progress Notes (Signed)
PROGRESS NOTE  Anna [name]  XIP:[id number] DOB: Jan 23, 1961  DOA: 08/04/2016 PCP: [name], MD   Brief Narrative:  This 56-year-old female with PMH of SLE, hypothyroid, HTN, HLD, stroke, depression, chronic CHF, presented to ED on 08/04/16 with once history of progressive malaise, generalized weakness and acute worsening of dyspnea and mental status changes. On initial admission, she was afebrile, hypoxic at 80% on room air. Admitted initially for acute encephalopathy, acute hypoxic respiratory failure, decompensated CHF and community-acquired pneumonia. Prolonged hospital course. Mental status changes improved or even resolved. Neurology signed off. PCCM assisting with management of hypoxia, HCAP and large R pleural effusion.   Assessment & Plan:   Principal Problem:   Acute diastolic CHF (congestive heart failure) (HCC) Active Problems:   SLE (systemic lupus erythematosus) (HCC)   Depression   Hypothyroid   Essential hypertension, benign   GERD (gastroesophageal reflux disease)   AKI (acute kidney injury) ([place])   Anemia   Acute respiratory failure with hypoxia (HCC)   Acute encephalopathy   Long-term use of Plaquenil   SIRS (systemic inflammatory response syndrome) (HCC)   HCAP (healthcare-associated pneumonia)   Pleural effusion   1. Acute hypoxic respiratory failure: She was treated empirically for community-acquired pneumonia and with Solu-Medrol for potential autoimmune contribution to encephalopathy, effusions of both. Antibiotics for CAP were discontinued on 08/08/16. However on 2/3, she developed leukocytosis (10.9 > 22.5) and HCAP coverage was initiated. Chest x-ray 2/3 showed findings concerning for right-sided pleural effusion with associated atelectasis. IR performed thoracentesis yielding 40 mL of thick pleural fluid and procedure discontinued due to suspected loculations. Pleural fluid was exudative. Pulmonary consultation and follow-up appreciated and indicate that  follow-up CT shows underlying infiltrate likely consistent with infection and residual pleural fluid that does not appear loculated. There recommend checking for lupus activity (CH 50 elevated, C3 & C4 normal, anti-DNA antibody and anti-scleroderma antibody pending) continued antibiotics and follow clinically. If worsening fevers and chest x-ray, then needs to have TCTS evaluate. Currently on 4 L HFNC O2. Blood cultures, urine cultures, pleural fluid cultures negative to date. Will wean off of oxygen as tolerated. Discussed with RN and RT.  2. Large right exudative pleural effusion: Management as per problem #1.   3. Acute on chronic diastolic CHF: Status post several days of IV Lasix. Clinically euvolemic. 2-D echo 08/06/16: LVEF 60-65 percent and grade 2 diastolic dysfunction. Not on Lasix currently.  4. Acute encephalopathy: CT & MRI head, TSH, free T4, B12, ammonia, CSF VDRL unremarkable. Initial concern for lupus related encephalopathy and started on IV Solu-Medrol. CSF without evidence of inflammation. Subsequently polypharmacy felt to be the most likely cause. Tapering Solu-Medrol. Neurology signed off note 08/08/16 appreciated. As per spouse, mental status changes have resolved.  5. Normocytic anemia: Relatively stable. Status post dose of B12 for low B12 and IV iron.  6. Hypothyroid: Continue Synthroid.  7. SLE: On Plaquenil and methotrexate at home >currently held in the context of suspected infection. On IV Solu-Medrol.  8. Depression: Medications are on hold secondary to acute encephalopathy. However remains on Lexapro. Outpatient follow-up with psychiatry.  9. Essential hypertension: Controlled. Continue metoprolol and amlodipine.  10. Leukocytosis: Resolved.  11. Acute kidney injury versus stage III chronic kidney disease: Last creatinine prior to this admission on 05/11/15:1.01. Admitted with creatinine of 1.58. Uncertain as to baseline creatinine over the 2 years prior. May well have  progressed. Creatinine has fluctuated during the hospital stay but 1.5 may be at baseline. Follow creatinine. Creatinine  1.39 on 2/8.  12. Hyperlipidemia: Continue statins. LFTs 08/14/16 unremarkable.   DVT prophylaxis:  subcutaneous heparin Code Status:  full Family Communication:  Discussed in detail with spouse at bedside. Disposition Plan:  continue management in stepdown unit. Discharge disposition to be determined when she has medically improved and stable. Transfer to medical bed when able to come off of high flow nasal cannula oxygen.   Consultants:   PCCM  Interventional Radiology  Neurology.  Procedures:    Ultrasound-guided right thoracentesis by IR on 08/11/16  Fluoroscopic guided LP by IR on 08/06/16  2-D echo 08/06/16: Study Conclusions  - Left ventricle: The cavity size was normal. Wall thickness was   increased in a pattern of mild LVH. Systolic function was normal.   The estimated ejection fraction was in the range of 60% to 65%.   Wall motion was normal; there were no regional wall motion   abnormalities. Features are consistent with a pseudonormal left   ventricular filling pattern, with concomitant abnormal relaxation   and increased filling pressure (grade 2 diastolic dysfunction). - Mitral valve: There was mild regurgitation. Valve area by   pressure half-time: 1.72 cm^2. - Left atrium: The atrium was moderately dilated. - Pulmonary arteries: Systolic pressure was mildly increased. PA   peak pressure: 39 mm Hg (S). - Pericardium, extracardiac: A trivial pericardial effusion was   identified.  Antimicrobials:    Azithromycin 1/29 > 1/31  IV ceftriaxone 1/29 > 1/31  IV cefepime 2/3 >  IV vancomycin 2/3 > 2/5   Subjective: States that her breathing is approaching baseline. No cough. Right-sided chest/back pain, worse with deep inspiration, especially at site of thoracentesis. No cough reported.  Objective:  Vitals:   08/14/16 0445 08/14/16 0800  08/14/16 0946 08/14/16 1100  BP: (!) 146/78 (!) 137/95  (!) 143/73  Pulse: 62 77 71 68  Resp: (!) 21 (!) 28  15  Temp: 98.6 F (37 C) 98.5 F (36.9 C)  98.4 F (36.9 C)  TempSrc: Oral Oral  Oral  SpO2: 93% 96%  97%  Weight: 85 kg (187 lb 6.3 oz)     Height:        Intake/Output Summary (Last 24 hours) at 08/14/16 1140 Last data filed at 08/14/16 0947  Gross per 24 hour  Intake              123 ml  Output              700 ml  Net             -577 ml   Filed Weights   08/12/16 0316 08/13/16 0452 08/14/16 0445  Weight: 88 kg (194 lb 0.1 oz) 87 kg (191 lb 12.8 oz) 85 kg (187 lb 6.3 oz)    Examination:  General exam: Pleasant middle-aged female sitting up comfortably in bed without distress. Respiratory system:  reduced breath sounds in the bases right >left. Rest of lung fields clear to auscultation. Respiratory effort normal. Cardiovascular system: S1 & S2 heard, RRR. No JVD, murmurs, rubs, gallops or clicks. No pedal edema. telemetry: Sinus rhythm.  Gastrointestinal system: Abdomen is nondistended, soft and nontender. No organomegaly or masses felt. Normal bowel sounds heard. Central nervous system: Alert and oriented. No focal neurological deficits. Extremities: Symmetric 5 x 5 power. Skin: No rashes, lesions or ulcers Psychiatry: Judgement and insight appear normal. Mood & affect appropriate.     Data Reviewed: I have personally reviewed following labs and imaging studies  CBC:  Recent Labs Lab 08/08/16 1004 08/09/16 0330 08/10/16 0059 08/11/16 0819 08/12/16 0354 08/13/16 0651  WBC 10.9* 22.5* 36.8* 25.7* 15.5* 9.9  NEUTROABS 6.9  --  30.9* 22.4* 13.8* 6.0  HGB 10.5* 10.7* 9.3* 9.5* 9.1* 8.8*  HCT 29.9* 31.9* 27.4* 27.3* 27.3* 26.7*  MCV 88.7 87.9 88.7 89.5 88.3 88.4  PLT 556* 509* 439* 437* 416* 161*   Basic Metabolic Panel:  Recent Labs Lab 08/10/16 0059 08/11/16 0141 08/12/16 0354 08/13/16 0651 08/14/16 0318  NA 139 140 137 138 136  K 4.2 4.2 4.1  3.6 3.7  CL 103 104 100* 101 104  CO2 26 24 24 25 23   GLUCOSE 134* 117* 143* 97 93  BUN 30* 44* 53* 43* 37*  CREATININE 1.33* 1.62* 1.78* 1.58* 1.39*  CALCIUM 8.6* 8.7* 8.8* 8.7* 8.5*  MG 2.1 2.2 2.3 2.3  --    GFR: Estimated Creatinine Clearance: 50.1 mL/min (by C-G formula based on SCr of 1.39 mg/dL (H)). Liver Function Tests:  Recent Labs Lab 08/11/16 1601 08/12/16 0354 08/14/16 0318  AST  --  113* 33  ALT  --  75* 46  ALKPHOS  --  70 69  BILITOT  --  0.4 0.7  PROT 6.7 6.7 6.1*  ALBUMIN  --  2.1* 2.1*   No results for input(s): LIPASE, AMYLASE in the last 168 hours. No results for input(s): AMMONIA in the last 168 hours. Coagulation Profile: No results for input(s): INR, PROTIME in the last 168 hours. Cardiac Enzymes:  Recent Labs Lab 08/09/16 1937 08/10/16 0059 08/10/16 0629  TROPONINI 0.05* 0.03* 0.05*   BNP (last 3 results) No results for input(s): PROBNP in the last 8760 hours. HbA1C: No results for input(s): HGBA1C in the last 72 hours. CBG: No results for input(s): GLUCAP in the last 168 hours. Lipid Profile: No results for input(s): CHOL, HDL, LDLCALC, TRIG, CHOLHDL, LDLDIRECT in the last 72 hours. Thyroid Function Tests: No results for input(s): TSH, T4TOTAL, FREET4, T3FREE, THYROIDAB in the last 72 hours. Anemia Panel: No results for input(s): VITAMINB12, FOLATE, FERRITIN, TIBC, IRON, RETICCTPCT in the last 72 hours.  Sepsis Labs:  Recent Labs Lab 08/09/16 1825 08/10/16 0059 08/11/16 0819 08/11/16 1601 08/12/16 0354 08/13/16 0651  PROCALCITON  --   --   --  0.36  --  0.19  WBC  --  36.8* 25.7*  --  15.5* 9.9  LATICACIDVEN 1.4  --   --   --   --   --     Recent Results (from the past 240 hour(s))  Culture, blood (Routine x 2)     Status: None   Collection Time: 08/04/16  4:33 PM  Result Value Ref Range Status   Specimen Description BLOOD LEFT HAND  Final   Special Requests IN PEDIATRIC BOTTLE  3CC  Final   Culture NO GROWTH 5 DAYS   Final   Report Status 08/09/2016 FINAL  Final  Culture, blood (Routine x 2)     Status: None   Collection Time: 08/04/16  4:57 PM  Result Value Ref Range Status   Specimen Description BLOOD LEFT ARM  Final   Special Requests BOTTLES DRAWN AEROBIC AND ANAEROBIC  5CC  Final   Culture NO GROWTH 5 DAYS  Final   Report Status 08/09/2016 FINAL  Final  Urine culture     Status: None   Collection Time: 08/04/16  8:00 PM  Result Value Ref Range Status   Specimen Description URINE, CATHETERIZED  Final  Special Requests NONE  Final   Culture NO GROWTH  Final   Report Status 08/06/2016 FINAL  Final  CSF culture     Status: None   Collection Time: 08/06/16  9:37 AM  Result Value Ref Range Status   Specimen Description CSF  Final   Special Requests TUBE 2  Final   Gram Stain CYTOSPIN SMEAR NO WBC SEEN NO ORGANISMS SEEN   Final   Culture NO GROWTH 3 DAYS  Final   Report Status 08/09/2016 FINAL  Final  Culture, blood (routine x 2)     Status: None (Preliminary result)   Collection Time: 08/09/16  6:34 PM  Result Value Ref Range Status   Specimen Description BLOOD RIGHT ANTECUBITAL  Final   Special Requests BOTTLES DRAWN AEROBIC AND ANAEROBIC  10CC EA  Final   Culture NO GROWTH 4 DAYS  Final   Report Status PENDING  Incomplete  Culture, blood (routine x 2)     Status: None (Preliminary result)   Collection Time: 08/09/16  6:36 PM  Result Value Ref Range Status   Specimen Description BLOOD RIGHT HAND  Final   Special Requests BOTTLES DRAWN AEROBIC AND ANAEROBIC 5CC EA  Final   Culture NO GROWTH 4 DAYS  Final   Report Status PENDING  Incomplete  Respiratory Panel by PCR     Status: None   Collection Time: 08/10/16  8:18 AM  Result Value Ref Range Status   Adenovirus NOT DETECTED NOT DETECTED Final   Coronavirus 229E NOT DETECTED NOT DETECTED Final   Coronavirus HKU1 NOT DETECTED NOT DETECTED Final   Coronavirus NL63 NOT DETECTED NOT DETECTED Final   Coronavirus OC43 NOT DETECTED NOT  DETECTED Final   Metapneumovirus NOT DETECTED NOT DETECTED Final   Rhinovirus / Enterovirus NOT DETECTED NOT DETECTED Final   Influenza A NOT DETECTED NOT DETECTED Final   Influenza B NOT DETECTED NOT DETECTED Final   Parainfluenza Virus 1 NOT DETECTED NOT DETECTED Final   Parainfluenza Virus 2 NOT DETECTED NOT DETECTED Final   Parainfluenza Virus 3 NOT DETECTED NOT DETECTED Final   Parainfluenza Virus 4 NOT DETECTED NOT DETECTED Final   Respiratory Syncytial Virus NOT DETECTED NOT DETECTED Final   Bordetella pertussis NOT DETECTED NOT DETECTED Final   Chlamydophila pneumoniae NOT DETECTED NOT DETECTED Final   Mycoplasma pneumoniae NOT DETECTED NOT DETECTED Final  Culture, Urine     Status: None   Collection Time: 08/10/16  5:37 PM  Result Value Ref Range Status   Specimen Description URINE, CLEAN CATCH  Final   Special Requests NONE  Final   Culture NO GROWTH  Final   Report Status 08/11/2016 FINAL  Final  MRSA PCR Screening     Status: None   Collection Time: 08/11/16  8:08 AM  Result Value Ref Range Status   MRSA by PCR NEGATIVE NEGATIVE Final    Comment:        The GeneXpert MRSA Assay (FDA approved for NASAL specimens only), is one component of a comprehensive MRSA colonization surveillance program. It is not intended to diagnose MRSA infection nor to guide or monitor treatment for MRSA infections.   Culture, body fluid-bottle     Status: None (Preliminary result)   Collection Time: 08/11/16 12:23 PM  Result Value Ref Range Status   Specimen Description PLEURAL RIGHT  Final   Special Requests NONE  Final   Culture NO GROWTH 2 DAYS  Final   Report Status PENDING  Incomplete  Gram stain  Status: None   Collection Time: 08/11/16 12:23 PM  Result Value Ref Range Status   Specimen Description PLEURAL RIGHT  Final   Special Requests NONE  Final   Gram Stain   Final    RARE WBC PRESENT, PREDOMINANTLY PMN NO ORGANISMS SEEN    Report Status 08/11/2016 FINAL  Final          Radiology Studies: No results found.      Scheduled Meds: . amLODipine  2.5 mg Oral Daily  . atorvastatin  20 mg Oral q1800  . carbamazepine  200 mg Oral q morning - 10a   And  . carbamazepine  400 mg Oral QHS  . ceFEPime (MAXIPIME) IV  2 g Intravenous Q24H  . escitalopram  30 mg Oral Daily  . famotidine  20 mg Oral Daily  . heparin subcutaneous  5,000 Units Subcutaneous Q8H  . levothyroxine  75 mcg Oral QAC breakfast  . methylPREDNISolone (SOLU-MEDROL) injection  40 mg Intravenous Daily  . metoprolol succinate  50 mg Oral Daily  . pantoprazole  40 mg Oral Q1200  . sodium chloride flush  3 mL Intravenous Q12H   Continuous Infusions:   LOS: 10 days       HONGALGI,ANAND, MD [facility] Pager [phone]  If 7PM-7AM, please contact night-coverage [email or web address] Password [email or web address] 08/14/2016, 11:40 AM

## 2016-08-15 ENCOUNTER — Inpatient Hospital Stay (HOSPITAL_COMMUNITY): Payer: BLUE CROSS/BLUE SHIELD

## 2016-08-15 DIAGNOSIS — N179 Acute kidney failure, unspecified: Secondary | ICD-10-CM

## 2016-08-15 LAB — PROCALCITONIN: Procalcitonin: 0.12 ng/mL

## 2016-08-15 MED ORDER — PREDNISONE 20 MG PO TABS
40.0000 mg | ORAL_TABLET | Freq: Every day | ORAL | Status: DC
  Administered 2016-08-16: 40 mg via ORAL
  Filled 2016-08-15: qty 2

## 2016-08-15 MED ORDER — AMOXICILLIN-POT CLAVULANATE 875-125 MG PO TABS
1.0000 | ORAL_TABLET | Freq: Two times a day (BID) | ORAL | Status: DC
  Administered 2016-08-16: 1 via ORAL
  Filled 2016-08-15: qty 1

## 2016-08-15 NOTE — Care Management Note (Signed)
Case Management Note  Patient Details  Name: LILIANA DANG MRN: [id number] Date of Birth: 1960/11/10  Subjective/Objective:                    Action/Plan: Potential discharge if medically stable on tomorrow.  Expected Discharge Date:   08/17/2015              Expected Discharge Plan:  Home with home health services In-House Referral:  Clinical Social Work  Discharge planning Services  CM Consult  Post Acute Care Choice:    Choice offered to:   patient/ husband  DME Arranged:   pt owns walker, no need for referral DME Agency:     [place]:   PT,RN [place] referral made with Butch Penny @ [phone]. CM have requested orders from MD.  Status of Service:  Completed, signed off  If discussed at [place] of Stay Meetings, dates discussed:    Additional Comments:  Sharin Mons, RN 08/15/2016, 4:09 PM

## 2016-08-15 NOTE — Clinical Social Work Note (Addendum)
CSW continuing to follow patient's readiness for discharge. Per MD's 2/9 progress note, patient should be stable for discharge on 2/10. Physical therapy worked with patient and is recommending [place] [facility]. Call made to [facility], admissions director with [facility] and message left regarding therapy now recommending [facility] physical therapy. Nurse case manager updated and will talk with patient regarding [place] needs upon discharge.  Anna [name], MSW, LCSW Licensed Clinical Social Worker [place] [phone]

## 2016-08-15 NOTE — Progress Notes (Signed)
Physical Therapy Treatment Patient Details Name: KAIRAH LEONI MRN: [id number] DOB: 1961/04/17 Today's Date: 08/15/2016    History of Present Illness 56 yo female wtih onset of hypoxia, acute encephalopathy, flat troponins and pulm edema with SOB and AKI was admitted and noted to have acute CHF.  PMHx:  stroke, Lupus, HTN, Depression, hypothyroidism, smoker.     PT Comments    Pt presented supine in bed with HOB elevated, awake and willing to participate in therapy session. Pt making good progress this session, increasing distance ambulated and demonstrating improved stability with gait. All VSS throughout. Pt would continue to benefit from skilled physical therapy services at this time while admitted and after d/c to address her limitations in order to improve her overall safety and independence with functional mobility.    Follow Up Recommendations  Home health PT;Supervision for mobility/OOB     Equipment Recommendations  Rolling walker with 5" wheels    Recommendations for Other Services       Precautions / Restrictions Precautions Precautions: Fall Precaution Comments: watch 02 sats Restrictions Weight Bearing Restrictions: No    Mobility  Bed Mobility Overal bed mobility: Modified Independent                Transfers Overall transfer level: Needs assistance Equipment used: Rolling walker (2 wheeled) Transfers: Sit to/from Stand Sit to Stand: Supervision         General transfer comment: increased time, supervision for safety  Ambulation/Gait Ambulation/Gait assistance: Supervision Ambulation Distance (Feet): 300 Feet Assistive device: Rolling walker (2 wheeled) Gait Pattern/deviations: Step-through pattern;Decreased step length - right;Decreased step length - left;Decreased stride length Gait velocity: decreased Gait velocity interpretation: Below normal speed for age/gender General Gait Details: pt demonstrated safety with use of RW, steady gait  pattern with supervision for safety   Stairs            Wheelchair Mobility    Modified Rankin (Stroke Patients Only)       Balance Overall balance assessment: Needs assistance Sitting-balance support: Feet supported Sitting balance-Leahy Scale: Good     Standing balance support: During functional activity;Single extremity supported Standing balance-Leahy Scale: Poor Standing balance comment: UE support and min guard for static standing                    Cognition Arousal/Alertness: Awake/alert Behavior During Therapy: WFL for tasks assessed/performed Overall Cognitive Status: Within Functional Limits for tasks assessed                 General Comments: pt alert and oriented throughout, following commands appropriately    Exercises      General Comments        Pertinent Vitals/Pain Pain Assessment: No/denies pain    Home Living                      Prior Function            PT Goals (current goals can now be found in the care plan section) Acute Rehab PT Goals Patient Stated Goal: to return home PT Goal Formulation: With patient/family Time For Goal Achievement: 08/19/16 Potential to Achieve Goals: Good Progress towards PT goals: Progressing toward goals    Frequency    Min 3X/week      PT Plan Current plan remains appropriate;Discharge plan needs to be updated    Co-evaluation             End of Session Equipment Utilized During  Treatment: Gait belt Activity Tolerance: Patient tolerated treatment well Patient left: in chair;with call bell/phone within reach     Time: 1244-1312 PT Time Calculation (min) (ACUTE ONLY): 28 min  Charges:  $Gait Training: 23-37 mins                    G CodesClearnce Sorrel Aydenn Gervin 08-20-2016, 3:06 PM Sherie Don, [place], DPT [phone]

## 2016-08-15 NOTE — Progress Notes (Signed)
Name: Anna [name] MRN: [id number] DOB: 1960/12/26    ADMISSION DATE:  08/04/2016 CONSULTATION DATE:  08/11/16  REFERRING MD :  Dr. Posey Pronto   CHIEF COMPLAINT:  Pleural Effusion    SUMMARY:  56 y/o F admitted 1/29 with a 2 month hx of progressive malaise, generalized weakness, confusion and increasing dyspnea.    She was admitted per [facility] for further evaluation.  Initial work up was concerning for acute hypoxic respiratory failure in the setting of decompensated CHF, AKI, anemia and acute encephalopathy.  CXR at that time was concerning for bilateral pleural effusions & cardiomegaly.  Non-contrast CT of the head was negative for intracranial abnormalities.  She was empirically treated for possible pneumonia with rocephin / azithromycin and lasix.  Flu swab assessed and negative.    Overnight 1/30, there were concerns for hypoxia and altered mental status but the patient was stable per documentation.  Work up for altered mental status included an MRI of the brain which was an incomplete study due to motion, obtained sequences were unremarkable.  She was started on solumedrol with her hx of lupus.  Neurology was consulted.  UDS was positive for benzodiazepines, she was pan cultured.   Free tegretol level 2.8 (wnl).  Ammonia level 22, HIV negative.  IR was consulted for an LP.  RPR pending.  ABG demonstrated compensated hypercarbia.  Cultures to date are negative.  There was question that she may have accidentally overdosed on her medications.    ABX for CAP were discontinued on 2/2.  However on 2/3, she had a rise in her WBC from 10.9 to 22.5 with concern for possible HCAP.  ABX were restarted for HCAP coverage.  CXR 2/3 showed a large dense opacity in the R mid and lower lung concerning for possible effusion with associated atelectasis.  She was further evaluated by IR and a thoracentesis was completed which yeilded 40 ml of thick pleural fluid > procedure had to be stopped due to apparent loculations  / thickness of fluid.  Pleural fluid was exudative by lights criteria (LDH 982), WBC 712.    PCCM consulted to evaluate pleural effusion.   PMHx:  CHF, HTN, HLD, depression, lupus (on plaqenil & methotrexate) and CVA   SUBJECTIVE: Pt. States she just got back from radiology. Wishes she could have a break. Breathing is better, but " I am paranoid about getting short of breath again"  VITAL SIGNS: Temp:  [97.8 F (36.6 C)-99 F (37.2 C)] 99 F (37.2 C) (02/09 0709) Pulse Rate:  [65-76] 72 (02/09 0709) Resp:  [15-28] 24 (02/09 0709) BP: (123-154)/(73-93) 144/87 (02/09 0709) SpO2:  [90 %-97 %] 90 % (02/09 0709) Weight:  [191 lb 11.2 oz (87 kg)] 191 lb 11.2 oz (87 kg) (02/09 0429)  PHYSICAL EXAMINATION: General:  Obese female, supine in bed on RA in NAD HEENT: MM Pink and moist, -JVD PSY: calm but agitated about having to go to radiology.  Neuro: Awake, alert with clear speech, oriented x 3 CV: RRR with no rub, murmur or gallop noted PULM: Even, non-labored respirations, rhonchi on R, diminished per R. VH:QIONG, soft, non-tender, BS +, tolerating diet  Extremities: warm and dry, pulses palpable  Skin: without rash or lesions noted     Recent Labs Lab 08/12/16 0354 08/13/16 0651 08/14/16 0318  NA 137 138 136  K 4.1 3.6 3.7  CL 100* 101 104  CO2 24 25 23   BUN 53* 43* 37*  CREATININE 1.78* 1.58* 1.39*  GLUCOSE  143* 97 93     Recent Labs Lab 08/11/16 0819 08/12/16 0354 08/13/16 0651  HGB 9.5* 9.1* 8.8*  HCT 27.3* 27.3* 26.7*  WBC 25.7* 15.5* 9.9  PLT 437* 416* 425*    Dg Chest 2 View  Result Date: 08/15/2016 CLINICAL DATA:  Productive cough with recent pleural effusion EXAM: CHEST  2 VIEW COMPARISON:  Chest radiograph and chest CT Nov 08, 2016 FINDINGS: Right pleural effusion is considerably smaller compared to recent studies. There is airspace consolidation in the right lower lobe. There is mild right middle lobe atelectatic change. Left lung is clear. Heart size  and pulmonary vascularity are normal. No adenopathy. No bone lesions. IMPRESSION: Extensive airspace consolidation right lower lobe. Mild atelectasis right middle lobe. Right pleural effusion much smaller compared to recent studies. Left lung remains clear. Stable cardiac silhouette. Electronically Signed   By: Lowella Grip III M.D.   On: 08/15/2016 09:27      SIGNIFICANT EVENTS  1/29  Admit with AMS, weakness 2/03  Significant increase in WBC, cxr with effusion  2/05  IR thora with thick pleural fluid.  PCCM consulted for evaluation of pleural effusion   STUDIES:  CT Chest 2/5 >> IMPRESSION: 1. Moderate right pleural effusion. There is dependent right lower lobe and, to lesser degree, right middle lobe opacity. This may all be atelectasis. A component of pneumonia should be suspected in the proper clinical setting. 2. Minor dependent left lower lobe and anterior inferior right upper lobe opacity that is likely atelectasis. 3. No pulmonary edema. 4. Small pericardial effusion. 5. Moderate centrilobular emphysema  08/15/2016 CXR IMPRESSION: Extensive airspace consolidation right lower lobe. Mild atelectasis right middle lobe. Right pleural effusion much smaller compared to recent studies. Left lung remains clear. Stable cardiac silhouette.  ASSESSMENT / PLAN:  Discussion: 56 y/o F with SLE on immunosuppression admitted for altered MS / confusion for 2 months.  Admitted this occasion for hypoxemia and found to have bilateral plerual effusions.  She was treated empirically for PNA and solu-medrol for potential auto-immune contribution to encephalopathy, effusions or both. ABX broadened 2/3.  IR attempted thoracentesis 2/4 and was low yield, [place] eval revealed septations and complicated pleural space.    Large Right Exudative Pleural Effusion - exudative by LDH, ddx includes possible parapneumonic effusion in the setting of immunocompromise (methotrexate / plaquenil for lupus), empyema  and less likely effusion from CHF.  Also must consider autoimmune related / lupus.  Unable to adequately drain via thoracentesis per IR.  Concerning she may need surgical evaluation but on CT chest effusion appears free flowing. 2/9: T Max 99, Anti-DNA antibody: 55, CH-50=>60.  Plan: Continue O2 for sats goal  90-95% ( RA 2/9) Wean to RA as able for same saturation goal Follow clinically for now, may need CVTS if increase in size, pain or fevers Awake and alert and able to make her own decisions at present Follow CXR  Trend WBC, fever curve Trend procalcitinin Diuresis as renal function / BP permit Minimize sedating medications Vanco / Cefepime D4/x  Reduce steroids as able (started by [facility] for hx of lupus) Continue PPI  Aspiration precautions  Lupus flare, titrate steroids as able. Mobilize Aggressive Pulmonary Toilet as patient is now alert and participating in her care. Add IS Q 2 hours while awake Continue flutter valve 4 puffs every 4 hours while awake.   Magdalen Spatz, AGACNP-BC [place] Pager # [phone]  08/15/2016, 9:30 AM  Pt. Updated at bedside  STAFF NOTE: I, Merrie Roof, MD FACP have personally reviewed patient's available data, including medical history, events of note, physical examination and test results as part of my evaluation. I have discussed with resident/NP and other care providers such as pharmacist, RN and RRT. In addition, I personally evaluated patient and elicited key findings of: awake in bed, no distress, no confusion, lungs clear, slight reduced rt base, repeat pcxr improved effusion, NO fevers, auto immune labs not impressed, unknown baseline DSDNA, no role chest tube with prior repeat CT simple layering residual effusion post tap, exudative related to PNA,  She needs to mobilize, even to neg balance, reduce steroids to 40 pred then 20 over 3 days then home to primary to determine pred needs for lupus, ABX x 10 days,  pulm follow up in 7-10 days to ensure no re occurrence effusion and responding to ABX course, d/w triad, will sign off call if needed   Lavon Paganini. Titus Mould, MD, [place] Pgr: [facility] 08/15/2016 5:21 PM

## 2016-08-15 NOTE — Progress Notes (Signed)
Admission note:  Arrival Method: transfer from 4e, pt arrived to unit via wheelchair accompanied by staff and sister. Mental Orientation: A&Ox4 Assessment: completed Skin: intact IV: left forearm, flushed, saline locked. Dressing clean and dry. Pain: denies any pain or discomfort. Safety Measures: safety measures and fall prevention plan reviewed with pt, call bell and belongings within reach. Admission Screening:  6700 Orientation: Patient has been oriented to the unit, staff and to the room.

## 2016-08-15 NOTE — Progress Notes (Signed)
PROGRESS NOTE  Anna [name]  BTD:[id number] DOB: 1961-01-17  DOA: 08/04/2016 PCP: [name], MD   Brief Narrative:  This 56-year-old female with PMH of SLE, hypothyroid, HTN, HLD, stroke, depression, chronic CHF, presented to ED on 08/04/16 with once history of progressive malaise, generalized weakness and acute worsening of dyspnea and mental status changes. On initial admission, she was afebrile, hypoxic at 80% on room air. Admitted initially for acute encephalopathy, acute hypoxic respiratory failure, decompensated CHF and community-acquired pneumonia. Prolonged hospital course. Mental status changes improved or even resolved. Neurology signed off. PCCM assisting with management of hypoxia, HCAP and large R pleural effusion.   Assessment & Plan:   Principal Problem:   Acute diastolic CHF (congestive heart failure) (HCC) Active Problems:   SLE (systemic lupus erythematosus) (HCC)   Depression   Hypothyroid   Essential hypertension, benign   GERD (gastroesophageal reflux disease)   AKI (acute kidney injury) ([place])   Anemia   Acute respiratory failure with hypoxia (HCC)   Acute encephalopathy   Long-term use of Plaquenil   SIRS (systemic inflammatory response syndrome) (HCC)   HCAP (healthcare-associated pneumonia)   Pleural effusion   1. Acute hypoxic respiratory failure: She was treated empirically for community-acquired pneumonia and with Solu-Medrol for potential autoimmune contribution to encephalopathy, effusions of both. Antibiotics for CAP were discontinued on 08/08/16. However on 2/3, she developed leukocytosis (10.9 > 22.5) and HCAP coverage was initiated. Chest x-ray 2/3 showed findings concerning for right-sided pleural effusion with associated atelectasis. IR performed thoracentesis yielding 40 mL of thick pleural fluid and procedure discontinued due to suspected loculations. Pleural fluid was exudative. Pulmonary consultation and follow-up appreciated and indicate that  follow-up CT shows underlying infiltrate likely consistent with infection and residual pleural fluid that does not appear loculated. There recommend checking for lupus activity (CH 50 elevated, C3 & C4 normal, anti-DNA antibody and anti-scleroderma antibody pending) continued antibiotics and follow clinically. If worsening fevers and chest x-ray, then needs to have TCTS evaluate. Blood cultures, urine cultures, pleural fluid cultures negative to date. Weaned to room air on 08/15/16. As discussed with pulmonology/Dr. Titus Mould on 2/9: Transitioned to oral Augmentin and complete total 10 days treatment (start date 08/09/16), change steroids to oral prednisone 40 daily and taper over the next 4 days to 20 mg daily and leave her on that dose until outpatient follow-up with PCP/rheumatology, PCCM will arrange outpatient follow-up in the next 7-10 days & okay to discharge from their perspective. Repeat chest x-ray 2/9: Images and report personally reviewed. Better compared to prior.  2. Large right exudative pleural effusion: Management as per problem #1.   3. Acute on chronic diastolic CHF: Status post several days of IV Lasix. Clinically euvolemic. 2-D echo 08/06/16: LVEF 60-65 percent and grade 2 diastolic dysfunction. Not on Lasix currently.  4. Acute encephalopathy: CT & MRI head, TSH, free T4, B12, ammonia, CSF VDRL unremarkable. Initial concern for lupus related encephalopathy and started on IV Solu-Medrol. CSF without evidence of inflammation. Subsequently polypharmacy felt to be the most likely cause. Tapering Solu-Medrol. Neurology signed off note 08/08/16 appreciated. As per spouse, mental status changes have resolved.  5. Normocytic anemia: Relatively stable. Status post dose of B12 for low B12 and IV iron.  6. Hypothyroid: Continue Synthroid.  7. SLE: On Plaquenil and methotrexate at home >currently held in the context of suspected infection and to remain on hold until outpatient follow-up with  PCP/rheumatology. On IV Solu-Medrol-transitioned to oral steroids as indicated above..  8. Depression:  Medications are on hold secondary to acute encephalopathy. However remains on Lexapro. Outpatient follow-up with psychiatry.  9. Essential hypertension: Controlled. Continue metoprolol and amlodipine.  10. Leukocytosis: Resolved.  11. Acute kidney injury versus stage III chronic kidney disease: Last creatinine prior to this admission on 05/11/15:1.01. Admitted with creatinine of 1.58. Uncertain as to baseline creatinine over the 2 years prior. May well have progressed. Creatinine has fluctuated during the hospital stay but 1.5 may be at baseline. Follow creatinine. Creatinine 1.39 on 2/8.  12. Hyperlipidemia: Continue statins. LFTs 08/14/16 unremarkable.   DVT prophylaxis:  subcutaneous heparin Code Status:  full Family Communication:  Discussed in detail with spouse at bedside on 2/8. None at bedside today. Disposition Plan:  Transfer to medical bed 08/15/16. PT had earlier recommended SNF. Requested PT to reassess and if still needs SNF, clinical social worker to assist. Should be stable for DC 08/16/16.   Consultants:   PCCM  Interventional Radiology  Neurology.  Procedures:    Ultrasound-guided right thoracentesis by IR on 08/11/16  Fluoroscopic guided LP by IR on 08/06/16  2-D echo 08/06/16: Study Conclusions  - Left ventricle: The cavity size was normal. Wall thickness was   increased in a pattern of mild LVH. Systolic function was normal.   The estimated ejection fraction was in the range of 60% to 65%.   Wall motion was normal; there were no regional wall motion   abnormalities. Features are consistent with a pseudonormal left   ventricular filling pattern, with concomitant abnormal relaxation   and increased filling pressure (grade 2 diastolic dysfunction). - Mitral valve: There was mild regurgitation. Valve area by   pressure half-time: 1.72 cm^2. - Left atrium: The  atrium was moderately dilated. - Pulmonary arteries: Systolic pressure was mildly increased. PA   peak pressure: 39 mm Hg (S). - Pericardium, extracardiac: A trivial pericardial effusion was   identified.  Antimicrobials:    Azithromycin 1/29 > 1/31  IV ceftriaxone 1/29 > 1/31  IV cefepime 2/3 >  IV vancomycin 2/3 > 2/5   Subjective: Denies complaints. No dyspnea, cough or chest pain reported. As per RN, no acute issues.  Objective:  Vitals:   08/15/16 0006 08/15/16 0429 08/15/16 0709 08/15/16 1311  BP: 140/82 140/79 (!) 144/87 (!) 127/94  Pulse: 72 68 72 75  Resp: (!) 23 17 (!) 24 16  Temp: 98.9 F (37.2 C) 97.8 F (36.6 C) 99 F (37.2 C) 98.7 F (37.1 C)  TempSrc: Oral Oral Oral Oral  SpO2: 94% 95% 90% 94%  Weight:  87 kg (191 lb 11.2 oz)    Height:        Intake/Output Summary (Last 24 hours) at 08/15/16 1437 Last data filed at 08/15/16 1300  Gross per 24 hour  Intake             1013 ml  Output                2 ml  Net             1011 ml   Filed Weights   08/13/16 0452 08/14/16 0445 08/15/16 0429  Weight: 87 kg (191 lb 12.8 oz) 85 kg (187 lb 6.3 oz) 87 kg (191 lb 11.2 oz)    Examination:  General exam: Pleasant middle-aged female sitting up comfortably in bed without distress. Respiratory system:  reduced breath sounds in the bases right >left. Rest of lung fields clear to auscultation. Respiratory effort normal. Cardiovascular system: S1 & S2 heard,  RRR. No JVD, murmurs, rubs, gallops or clicks. No pedal edema. telemetry: Sinus rhythm.  Gastrointestinal system: Abdomen is nondistended, soft and nontender. No organomegaly or masses felt. Normal bowel sounds heard. Central nervous system: Alert and oriented. No focal neurological deficits. Extremities: Symmetric 5 x 5 power. Skin: No rashes, lesions or ulcers Psychiatry: Judgement and insight appear normal. Mood & affect appropriate.     Data Reviewed: I have personally reviewed following labs and  imaging studies  CBC:  Recent Labs Lab 08/09/16 0330 08/10/16 0059 08/11/16 0819 08/12/16 0354 08/13/16 0651  WBC 22.5* 36.8* 25.7* 15.5* 9.9  NEUTROABS  --  30.9* 22.4* 13.8* 6.0  HGB 10.7* 9.3* 9.5* 9.1* 8.8*  HCT 31.9* 27.4* 27.3* 27.3* 26.7*  MCV 87.9 88.7 89.5 88.3 88.4  PLT 509* 439* 437* 416* 735*   Basic Metabolic Panel:  Recent Labs Lab 08/10/16 0059 08/11/16 0141 08/12/16 0354 08/13/16 0651 08/14/16 0318  NA 139 140 137 138 136  K 4.2 4.2 4.1 3.6 3.7  CL 103 104 100* 101 104  CO2 26 24 24 25 23   GLUCOSE 134* 117* 143* 97 93  BUN 30* 44* 53* 43* 37*  CREATININE 1.33* 1.62* 1.78* 1.58* 1.39*  CALCIUM 8.6* 8.7* 8.8* 8.7* 8.5*  MG 2.1 2.2 2.3 2.3  --    GFR: Estimated Creatinine Clearance: 50.6 mL/min (by C-G formula based on SCr of 1.39 mg/dL (H)). Liver Function Tests:  Recent Labs Lab 08/11/16 1601 08/12/16 0354 08/14/16 0318  AST  --  113* 33  ALT  --  75* 46  ALKPHOS  --  70 69  BILITOT  --  0.4 0.7  PROT 6.7 6.7 6.1*  ALBUMIN  --  2.1* 2.1*   No results for input(s): LIPASE, AMYLASE in the last 168 hours. No results for input(s): AMMONIA in the last 168 hours. Coagulation Profile: No results for input(s): INR, PROTIME in the last 168 hours. Cardiac Enzymes:  Recent Labs Lab 08/09/16 1937 08/10/16 0059 08/10/16 0629  TROPONINI 0.05* 0.03* 0.05*   BNP (last 3 results) No results for input(s): PROBNP in the last 8760 hours. HbA1C: No results for input(s): HGBA1C in the last 72 hours. CBG: No results for input(s): GLUCAP in the last 168 hours. Lipid Profile: No results for input(s): CHOL, HDL, LDLCALC, TRIG, CHOLHDL, LDLDIRECT in the last 72 hours. Thyroid Function Tests: No results for input(s): TSH, T4TOTAL, FREET4, T3FREE, THYROIDAB in the last 72 hours. Anemia Panel: No results for input(s): VITAMINB12, FOLATE, FERRITIN, TIBC, IRON, RETICCTPCT in the last 72 hours.  Sepsis Labs:  Recent Labs Lab 08/09/16 1825  08/10/16 0059 08/11/16 3299 08/11/16 1601 08/12/16 0354 08/13/16 0651 08/15/16 0418  PROCALCITON  --   --   --  0.36  --  0.19 0.12  WBC  --  36.8* 25.7*  --  15.5* 9.9  --   LATICACIDVEN 1.4  --   --   --   --   --   --     Recent Results (from the past 240 hour(s))  CSF culture     Status: None   Collection Time: 08/06/16  9:37 AM  Result Value Ref Range Status   Specimen Description CSF  Final   Special Requests TUBE 2  Final   Gram Stain CYTOSPIN SMEAR NO WBC SEEN NO ORGANISMS SEEN   Final   Culture NO GROWTH 3 DAYS  Final   Report Status 08/09/2016 FINAL  Final  Culture, blood (routine x 2)  Status: None   Collection Time: 08/09/16  6:34 PM  Result Value Ref Range Status   Specimen Description BLOOD RIGHT ANTECUBITAL  Final   Special Requests BOTTLES DRAWN AEROBIC AND ANAEROBIC  10CC EA  Final   Culture NO GROWTH 5 DAYS  Final   Report Status 08/14/2016 FINAL  Final  Culture, blood (routine x 2)     Status: None   Collection Time: 08/09/16  6:36 PM  Result Value Ref Range Status   Specimen Description BLOOD RIGHT HAND  Final   Special Requests BOTTLES DRAWN AEROBIC AND ANAEROBIC 5CC EA  Final   Culture NO GROWTH 5 DAYS  Final   Report Status 08/14/2016 FINAL  Final  Respiratory Panel by PCR     Status: None   Collection Time: 08/10/16  8:18 AM  Result Value Ref Range Status   Adenovirus NOT DETECTED NOT DETECTED Final   Coronavirus 229E NOT DETECTED NOT DETECTED Final   Coronavirus HKU1 NOT DETECTED NOT DETECTED Final   Coronavirus NL63 NOT DETECTED NOT DETECTED Final   Coronavirus OC43 NOT DETECTED NOT DETECTED Final   Metapneumovirus NOT DETECTED NOT DETECTED Final   Rhinovirus / Enterovirus NOT DETECTED NOT DETECTED Final   Influenza A NOT DETECTED NOT DETECTED Final   Influenza B NOT DETECTED NOT DETECTED Final   Parainfluenza Virus 1 NOT DETECTED NOT DETECTED Final   Parainfluenza Virus 2 NOT DETECTED NOT DETECTED Final   Parainfluenza Virus 3 NOT  DETECTED NOT DETECTED Final   Parainfluenza Virus 4 NOT DETECTED NOT DETECTED Final   Respiratory Syncytial Virus NOT DETECTED NOT DETECTED Final   Bordetella pertussis NOT DETECTED NOT DETECTED Final   Chlamydophila pneumoniae NOT DETECTED NOT DETECTED Final   Mycoplasma pneumoniae NOT DETECTED NOT DETECTED Final  Culture, Urine     Status: None   Collection Time: 08/10/16  5:37 PM  Result Value Ref Range Status   Specimen Description URINE, CLEAN CATCH  Final   Special Requests NONE  Final   Culture NO GROWTH  Final   Report Status 08/11/2016 FINAL  Final  MRSA PCR Screening     Status: None   Collection Time: 08/11/16  8:08 AM  Result Value Ref Range Status   MRSA by PCR NEGATIVE NEGATIVE Final    Comment:        The GeneXpert MRSA Assay (FDA approved for NASAL specimens only), is one component of a comprehensive MRSA colonization surveillance program. It is not intended to diagnose MRSA infection nor to guide or monitor treatment for MRSA infections.   Culture, body fluid-bottle     Status: None (Preliminary result)   Collection Time: 08/11/16 12:23 PM  Result Value Ref Range Status   Specimen Description PLEURAL RIGHT  Final   Special Requests NONE  Final   Culture NO GROWTH 4 DAYS  Final   Report Status PENDING  Incomplete  Gram stain     Status: None   Collection Time: 08/11/16 12:23 PM  Result Value Ref Range Status   Specimen Description PLEURAL RIGHT  Final   Special Requests NONE  Final   Gram Stain   Final    RARE WBC PRESENT, PREDOMINANTLY PMN NO ORGANISMS SEEN    Report Status 08/11/2016 FINAL  Final         Radiology Studies: Dg Chest 2 View  Result Date: 08/15/2016 CLINICAL DATA:  Productive cough with recent pleural effusion EXAM: CHEST  2 VIEW COMPARISON:  Chest radiograph and chest CT Nov 08, 2016 FINDINGS: Right pleural effusion is considerably smaller compared to recent studies. There is airspace consolidation in the right lower lobe. There is  mild right middle lobe atelectatic change. Left lung is clear. Heart size and pulmonary vascularity are normal. No adenopathy. No bone lesions. IMPRESSION: Extensive airspace consolidation right lower lobe. Mild atelectasis right middle lobe. Right pleural effusion much smaller compared to recent studies. Left lung remains clear. Stable cardiac silhouette. Electronically Signed   By: Lowella Grip III M.D.   On: 08/15/2016 09:27        Scheduled Meds: . amLODipine  2.5 mg Oral Daily  . atorvastatin  20 mg Oral q1800  . carbamazepine  200 mg Oral q morning - 10a   And  . carbamazepine  400 mg Oral QHS  . ceFEPime (MAXIPIME) IV  2 g Intravenous Q24H  . escitalopram  30 mg Oral Daily  . famotidine  20 mg Oral Daily  . heparin subcutaneous  5,000 Units Subcutaneous Q8H  . levothyroxine  75 mcg Oral QAC breakfast  . methylPREDNISolone (SOLU-MEDROL) injection  40 mg Intravenous Daily  . metoprolol succinate  50 mg Oral Daily  . pantoprazole  40 mg Oral Q1200  . sodium chloride flush  3 mL Intravenous Q12H   Continuous Infusions:   LOS: 11 days       Lura Falor, MD [facility] Pager [phone]  If 7PM-7AM, please contact night-coverage [email or web address] Password [facility] 08/15/2016, 2:37 PM

## 2016-08-16 LAB — CBC
HEMATOCRIT: 27.1 % — AB (ref 36.0–46.0)
Hemoglobin: 9 g/dL — ABNORMAL LOW (ref 12.0–15.0)
MCH: 30.5 pg (ref 26.0–34.0)
MCHC: 33.2 g/dL (ref 30.0–36.0)
MCV: 91.9 fL (ref 78.0–100.0)
Platelets: 525 10*3/uL — ABNORMAL HIGH (ref 150–400)
RBC: 2.95 MIL/uL — ABNORMAL LOW (ref 3.87–5.11)
RDW: 20.8 % — AB (ref 11.5–15.5)
WBC: 9.8 10*3/uL (ref 4.0–10.5)

## 2016-08-16 LAB — BASIC METABOLIC PANEL
ANION GAP: 13 (ref 5–15)
BUN: 26 mg/dL — ABNORMAL HIGH (ref 6–20)
CO2: 23 mmol/L (ref 22–32)
Calcium: 8.6 mg/dL — ABNORMAL LOW (ref 8.9–10.3)
Chloride: 101 mmol/L (ref 101–111)
Creatinine, Ser: 1.37 mg/dL — ABNORMAL HIGH (ref 0.44–1.00)
GFR, EST AFRICAN AMERICAN: 49 mL/min — AB (ref >60)
GFR, EST NON AFRICAN AMERICAN: 42 mL/min — AB (ref >60)
Glucose, Bld: 97 mg/dL (ref 65–99)
Potassium: 3.9 mmol/L (ref 3.5–5.1)
SODIUM: 137 mmol/L (ref 135–145)

## 2016-08-16 LAB — CULTURE, BODY FLUID-BOTTLE

## 2016-08-16 LAB — CULTURE, BODY FLUID W GRAM STAIN -BOTTLE: Culture: NO GROWTH

## 2016-08-16 MED ORDER — FAMOTIDINE 20 MG PO TABS: 20.0000 mg | ORAL_TABLET | Freq: Every day | ORAL | 0 refills | Status: DC

## 2016-08-16 MED ORDER — AMOXICILLIN-POT CLAVULANATE 875-125 MG PO TABS: 1.0000 | ORAL_TABLET | Freq: Two times a day (BID) | ORAL | 0 refills | Status: DC

## 2016-08-16 MED ORDER — PREDNISONE 10 MG PO TABS: ORAL_TABLET | ORAL | 0 refills | Status: DC

## 2016-08-16 NOTE — Discharge Instructions (Signed)
Pleural Effusion  A pleural effusion is an abnormal buildup of fluid in the layers of tissue between your lungs and the inside of your chest (pleural space). These two layers of tissue that line both your lungs and the inside of your chest are called pleura. Usually, there is no air in the space between the pleura, only a thin layer of fluid. If left untreated, a large amount of fluid can build up and cause the lung to collapse. A pleural effusion is usually caused by another disease that requires treatment.  The two main types of pleural effusion are:  · Transudative pleural effusion. This happens when fluid leaks into the pleural space because of a low protein count in your blood or high blood pressure in your vessels. Heart failure often causes this.  · Exudative infusion. This occurs when fluid collects in the pleural space from blocked blood vessels or lymph vessels. Some lung diseases, injuries, and cancers can cause this type of effusion.    What are the causes?  Pleural effusion can be caused by:  · Heart failure.  · A blood clot in the lung (pulmonary embolism).  · Pneumonia.  · Cancer.  · Liver failure (cirrhosis).  · Kidney disease.  · Complications from surgery, such as from open heart surgery.    What are the signs or symptoms?  In some cases, pleural effusion may cause no symptoms. Symptoms can include:  · Shortness of breath, especially when lying down.  · Chest pain, often worse when taking a deep breath.  · Fever.  · Dry cough that is lasting (chronic).  · Hiccups.  · Rapid breathing.    An underlying condition that is causing the pleural effusion (such as heart failure, pneumonia, blood clots, tuberculosis, or cancer) may also cause additional symptoms.  How is this diagnosed?  Your health care provider may suspect pleural effusion based on your symptoms and medical history. Your health care provider will also do a physical exam and a chest X-ray. If the X-ray shows there is fluid in your chest,  you may need to have this fluid removed using a needle (thoracentesis) so it can be tested.  You may also have:  · Imaging studies of the chest, such as:  ? Ultrasound.  ? CT scan.  · Blood tests for kidney and liver function.    How is this treated?  Treatment depends on the cause of the pleural effusion. Treatment may include:  · Taking antibiotic medicines to clear up an infection that is causing the pleural effusion.  · Placing a tube in the chest to drain the effusion (tube thoracostomy). This procedure is often used when there is an infection in the fluid.  · Surgery to remove the fibrous outer layer of tissue from the pleural space (decortication).  · Thoracentesis, which can improve cough and shortness of breath.  · A procedure to put medicine into the chest cavity to seal the pleural space to prevent fluid buildup (pleurodesis).  · Chemotherapy and radiation therapy. These may be required in the case of cancerous (malignant) pleural effusion.    Follow these instructions at home:  · Take medicines only as directed by your health care provider.  · Keep track of how long you can gently exercise before you get short of breath. Try simply walking at first.  · Do not use any tobacco products, including cigarettes, chewing tobacco, or electronic cigarettes. If you need help quitting, ask your health care provider.  ·   you had thoracentesis. Watch for:  Drainage.  Redness.  Swelling.  You have a fever. Get help right away if:  You are short of breath.  You develop chest pain.  You develop a new cough. This information is not intended to replace advice given to you by your health care provider. Make sure you discuss any questions you have  with your health care provider. Document Released: 06/23/2005 Document Revised: 11/26/2015 Document Reviewed: 11/16/2013 Elsevier Interactive Patient Education  2017 [place].   Confusion Introduction Confusion is the inability to think with your usual speed or clarity. Confusion may come on quickly or slowly over time. How quickly the confusion comes on depends on the cause. Confusion can be due to any number of causes. What are the causes?  Concussion, head injury, or head trauma.  Seizures.  Stroke.  Fever.  Brain tumor.  Age related decreased brain function (dementia).  Heightened emotional states like rage or terror.  Mental illness in which the person loses the ability to determine what is real and what is not (hallucinations).  Infections such as a urinary tract infection (UTI).  Toxic effects from alcohol, drugs, or prescription medicines.  Dehydration and an imbalance of salts in the body (electrolytes).  Lack of sleep.  Low blood sugar (diabetes).  Low levels of oxygen from conditions such as chronic lung disorders.  Drug interactions or other medicine side effects.  Nutritional deficiencies, especially niacin, thiamine, vitamin C, or vitamin B.  Sudden drop in body temperature (hypothermia).  Change in routine, such as when traveling or hospitalized. What are the signs or symptoms? People often describe their thinking as cloudy or unclear when they are confused. Confusion can also include feeling disoriented. That means you are unaware of where or who you are. You may also not know what the date or time is. If confused, you may also have difficulty paying attention, remembering, and making decisions. Some people also act aggressively when they are confused. How is this diagnosed? The medical evaluation of confusion may include:  Blood and urine tests.  X-rays.  Brain and nervous system tests.  Analyzing your brain waves (electroencephalogram or  EEG).  Magnetic resonance imaging (MRI) of your head.  Computed tomography (CT) scan of your head.  Mental status tests in which your health care provider may ask many questions. Some of these questions may seem silly or strange, but they are a very important test to help diagnose and treat confusion. How is this treated? An admission to the hospital may not be needed, but a person with confusion should not be left alone. Stay with a family member or friend until the confusion clears. Avoid alcohol, pain relievers, or sedative drugs until you have fully recovered. Do not drive until directed by your health care provider. Follow these instructions at home: What family and friends can do:  To find out if someone is confused, ask the person to state his or her name, age, and the date. If the person is unsure or answers incorrectly, he or she is confused.  Always introduce yourself, no matter how well the person knows you.  Often remind the person of his or her location.  Place a calendar and clock near the confused person.  Help the person with his or her medicines. You may want to use a pill box, an alarm as a reminder, or give the person each dose as prescribed.  Talk about current events and plans for the day.  Try to  keep the environment calm, quiet, and peaceful.  Make sure the person keeps follow-up visits with his or her health care provider. How is this prevented? Ways to prevent confusion:  Avoid alcohol.  Eat a balanced diet.  Get enough sleep.  Take medicine only as directed by your health care provider.  Do not become isolated. Spend time with other people and make plans for your days.  Keep careful watch on your blood sugar levels if you are diabetic. Get help right away if:  You develop severe headaches, repeated vomiting, seizures, blackouts, or slurred speech.  There is increasing confusion, weakness, numbness, restlessness, or personality changes.  You  develop a loss of balance, have marked dizziness, feel uncoordinated, or fall.  You have delusions, hallucinations, or develop severe anxiety.  Your family members think you need to be rechecked. This information is not intended to replace advice given to you by your health care provider. Make sure you discuss any questions you have with your health care provider. Document Released: 07/31/2004 Document Revised: 01/11/2016 Document Reviewed: 07/29/2013  2017 Elsevier  Additional discharge instructions:  Please get your medications reviewed and adjusted by your Primary MD.  Please request your Primary MD to go over all Hospital Tests and Procedure/Radiological results at the follow up, please get all Hospital records sent to your Prim MD by signing hospital release before you go home.  If you had Pneumonia of Lung problems at the Hospital: Please get a 2 view Chest X ray done in 6-8 weeks after hospital discharge or sooner if instructed by your Primary MD.  If you have Congestive Heart Failure: Please call your Cardiologist or Primary MD anytime you have any of the following symptoms:  1) 3 pound weight gain in 24 hours or 5 pounds in 1 week  2) shortness of breath, with or without a dry hacking cough  3) swelling in the hands, feet or stomach  4) if you have to sleep on extra pillows at night in order to breathe  Follow cardiac low salt diet and 1.5 lit/day fluid restriction.  If you have diabetes Accuchecks 4 times/day, Once in AM empty stomach and then before each meal. Log in all results and show them to your primary doctor at your next visit. If any glucose reading is under 80 or above 300 call your primary MD immediately.  If you have Seizure/Convulsions/Epilepsy: Please do not drive, operate heavy machinery, participate in activities at heights or participate in high speed sports until you have seen by Primary MD or a Neurologist and advised to do so again.  If you had  Gastrointestinal Bleeding: Please ask your Primary MD to check a complete blood count within one week of discharge or at your next visit. Your endoscopic/colonoscopic biopsies that are pending at the time of discharge, will also need to followed by your Primary MD.  Get Medicines reviewed and adjusted. Please take all your medications with you for your next visit with your Primary MD  Please request your Primary MD to go over all hospital tests and procedure/radiological results at the follow up, please ask your Primary MD to get all Hospital records sent to his/her office.  If you experience worsening of your admission symptoms, develop shortness of breath, life threatening emergency, suicidal or homicidal thoughts you must seek medical attention immediately by calling 911 or calling your MD immediately  if symptoms less severe.  You must read complete instructions/literature along with all the possible adverse reactions/side effects for  all the Medicines you take and that have been prescribed to you. Take any new Medicines after you have completely understood and accpet all the possible adverse reactions/side effects.   Do not drive or operate heavy machinery when taking Pain medications.   Do not take more than prescribed Pain, Sleep and Anxiety Medications  Special Instructions: If you have smoked or chewed Tobacco  in the last 2 yrs please stop smoking, stop any regular Alcohol  and or any Recreational drug use.  Wear Seat belts while driving.  Please note You were cared for by a hospitalist during your hospital stay. If you have any questions about your discharge medications or the care you received while you were in the hospital after you are discharged, you can call the unit and asked to speak with the hospitalist on call if the hospitalist that took care of you is not available. Once you are discharged, your primary care physician will handle any further medical issues. Please note that  NO REFILLS for any discharge medications will be authorized once you are discharged, as it is imperative that you return to your primary care physician (or establish a relationship with a primary care physician if you do not have one) for your aftercare needs so that they can reassess your need for medications and monitor your lab values.  You can reach the hospitalist office at phone [phone] or fax [phone]   If you do not have a primary care physician, you can call [phone] for a physician referral.

## 2016-08-16 NOTE — Progress Notes (Signed)
Discharge home with husband. All belongings sent with patient. No questions IV:[id number] instructions. D/C via wheelchair.

## 2016-08-16 NOTE — Discharge Summary (Signed)
Physician Discharge Summary  Anna [name] ELF:[id number] DOB: 06/24/1961  PCP: [name], MD  Admit date: 08/04/2016 Discharge date: 08/16/2016  Recommendations for Outpatient Follow-up:  1. Dr. [name], PCP in 5 days with repeat labs (CBC & BMP). 2. Primary Rheumatologist (patient and spouse cannot recollect name but feels that it is with [facility]) in 1 week. Follow up regarding management of lupus. 3. Primary Psychiatrist (patient and spouse cannot recollect the name but feels that it is with [facility]) in 1 week. 4. Dr. Merrie Roof, Pulmonology in one week. Will need repeat chest x-ray during follow-up.  Home Health: PT and RN Equipment/Devices: None    Discharge Condition: Improved and stable  CODE STATUS: Full  Diet recommendation: Heart healthy diet.  Discharge Diagnoses:  Principal Problem:   Acute diastolic CHF (congestive heart failure) (HCC) Active Problems:   SLE (systemic lupus erythematosus) (HCC)   Depression   Hypothyroid   Essential hypertension, benign   GERD (gastroesophageal reflux disease)   AKI (acute kidney injury) ([place])   Anemia   Acute respiratory failure with hypoxia (HCC)   Acute encephalopathy   Long-term use of Plaquenil   SIRS (systemic inflammatory response syndrome) (HCC)   HCAP (healthcare-associated pneumonia)   Pleural effusion   Brief/Interim Summary: This 56-year-old female with PMH of SLE, hypothyroid, HTN, HLD, stroke, depression, chronic CHF, presented to ED on 08/04/16 with once history of progressive malaise, generalized weakness and acute worsening of dyspnea and mental status changes. On initial admission, she was afebrile, hypoxic at 80% on room air. Admitted initially for acute encephalopathy, acute hypoxic respiratory failure, decompensated CHF and community-acquired pneumonia. Prolonged hospital course. Mental status changes improved or even resolved. Neurology signed off. PCCM  assisting with management of hypoxia, HCAP and large R pleural effusion.   Assessment & Plan:   1. Acute hypoxic respiratory failure: She was treated empirically for community-acquired pneumonia and with Solu-Medrol for potential autoimmune contribution to encephalopathy, effusions or both. Antibiotics for CAP were discontinued on 08/08/16. However on 2/3, she developed leukocytosis (10.9 > 22.5) and HCAP coverage was initiated. Chest x-ray 2/3 showed findings concerning for right-sided pleural effusion with associated atelectasis. IR performed thoracentesis yielding 40 mL of thick pleural fluid and procedure discontinued due to suspected loculations. Pleural fluid was exudative. Pulmonary consultation and follow-up appreciated and indicate that follow-up CT shows underlying infiltrate likely consistent with infection and residual pleural fluid that does not appear loculated. There recommend checking for lupus activity (CH 50 elevated, C3 & C4 normal, anti-DNA antibody: 55 and anti-scleroderma antibody <0.2) continued antibiotics and follow clinically. If worsening fevers and chest x-ray, then needs to have TCTS evaluate. Blood cultures, urine cultures, pleural fluid cultures negative to date. Weaned to room air on 08/15/16. As discussed with pulmonology/Dr. Titus Mould on 2/9: Transitioned to oral Augmentin and complete total 10 days treatment (start date 08/09/16), changed steroids to oral prednisone 40 daily and taper over the next 4 days to 20 mg daily and leave her on that dose until outpatient follow-up with PCP/rheumatology, PCCM will arrange outpatient follow-up in the next 7-10 days & okay to discharge from their perspective. Repeat chest x-ray 2/9: Images and report personally reviewed. Improved. Hypoxia resolved. As per pulmonology, not impressed with autoimmune labs, unknown baseline DS DNA, exudative pleural effusion related to pneumonia. Follow-up with pulmonology to ensure no recurrence of pleural  effusion and responding to antibiotic course.  2. Large right exudative pleural effusion: Management as per problem #1.  3. Acute on chronic diastolic CHF: Status post several days of IV Lasix. Clinically euvolemic. 2-D echo 08/06/16: LVEF 60-65 percent and grade 2 diastolic dysfunction. Lasix held for several days, partly due to acute kidney injury and remained euvolemic. Reassess during outpatient follow-up regarding initiation of Lasix again.  4. Acute encephalopathy: CT & MRI head, TSH, free T4, B12, ammonia, CSF VDRL unremarkable. Initial concern for lupus related encephalopathy and started on IV Solu-Medrol. CSF without evidence of inflammation. Subsequently polypharmacy felt to be the most likely cause. Tapered steroids. Neurology signed off note 08/08/16 appreciated. As per spouse, mental status changes have resolved.  5. Normocytic anemia: Relatively stable. Status post dose of B12 for low B12 and IV iron.  6. Hypothyroid: Continue Synthroid at prior home dose.[name] TSH suppressed at 0.056 and free T4 was normal. Clinically euthyroid. Outpatient follow-up.  7. SLE: On Plaquenil and methotrexate at home >currently discontinued in the context of suspected infection and pleural effusion and to remain on hold until outpatient follow-up with PCP/rheumatology. On IV Solu-Medrol-transitioned to oral steroids as indicated above..  8. Depression: remains on Lexapro and Tegretol? Indication. Rest of her meds were discontinued. Outpatient follow-up with psychiatry.  9. Essential hypertension: Controlled. Continue metoprolol and amlodipine.  10. Leukocytosis: Resolved.  11. Acute kidney injury versus stage III chronic kidney disease: Last creatinine prior to this admission on 05/11/15:1.01. Admitted with creatinine of 1.58. Uncertain as to baseline creatinine over the 2 years prior. May well have progressed. Creatinine has fluctuated during the hospital stay but 1.5 may be at baseline. Follow  creatinine. Creatinine plateaued at 1.37-1.39 range over the last 2-3 days.  12. Hyperlipidemia: Continue statins. LFTs 08/14/16 unremarkable.   Consultants:   PCCM  Interventional Radiology  Neurology.  Procedures:    Ultrasound-guided right thoracentesis by IR on 08/11/16  Fluoroscopic guided LP by IR on 08/06/16  2-D echo 08/06/16: Study Conclusions  - Left ventricle: The cavity size was normal. Wall thickness was increased in a pattern of mild LVH. Systolic function was normal. The estimated ejection fraction was in the range of 60% to 65%. Wall motion was normal; there were no regional wall motion abnormalities. Features are consistent with a pseudonormal left ventricular filling pattern, with concomitant abnormal relaxation and increased filling pressure (grade 2 diastolic dysfunction). - Mitral valve: There was mild regurgitation. Valve area by pressure half-time: 1.72 cm^2. - Left atrium: The atrium was moderately dilated. - Pulmonary arteries: Systolic pressure was mildly increased. PA peak pressure: 39 mm Hg (S). - Pericardium, extracardiac: A trivial pericardial effusion was identified.   Discharge Instructions  Discharge Instructions    (HEART FAILURE PATIENTS) Call MD:  Anytime you have any of the following symptoms: 1) 3 pound weight gain in 24 hours or 5 pounds in 1 week 2) shortness of breath, with or without a dry hacking cough 3) swelling in the hands, feet or stomach 4) if you have to sleep on extra pillows at night in order to breathe.    Complete by:  As directed    Call MD for:    Complete by:  As directed    Worsening confusion.   Call MD for:  difficulty breathing, headache or visual disturbances    Complete by:  As directed    Call MD for:  extreme fatigue    Complete by:  As directed    Call MD for:  persistant dizziness or light-headedness    Complete by:  As directed    Call MD for:  persistant nausea and vomiting     Complete by:  As directed    Call MD for:  severe uncontrolled pain    Complete by:  As directed    Call MD for:  temperature >100.4    Complete by:  As directed    Diet - low sodium heart healthy    Complete by:  As directed    Increase activity slowly    Complete by:  As directed        Medication List    STOP taking these medications   aspirin 500 MG EC tablet   diazepam 10 MG tablet Commonly known as:  VALIUM   diphenhydrAMINE 25 MG tablet Commonly known as:  BENADRYL   furosemide 20 MG tablet Commonly known as:  LASIX   gabapentin 100 MG capsule Commonly known as:  NEURONTIN   GOODY BODY PAIN 500-325 MG Pack Generic drug:  Aspirin-Acetaminophen   HYDROcodone-acetaminophen 5-325 MG tablet Commonly known as:  NORCO/VICODIN   hydroxychloroquine 200 MG tablet Commonly known as:  PLAQUENIL   losartan 100 MG tablet Commonly known as:  COZAAR   methocarbamol 500 MG tablet Commonly known as:  ROBAXIN   methotrexate 2.5 MG tablet Commonly known as:  RHEUMATREX   QUEtiapine 300 MG tablet Commonly known as:  SEROQUEL   THERAFLU SEVERE COLD & COUGH PO     TAKE these medications   amLODipine 10 MG tablet Commonly known as:  NORVASC Take 10 mg by mouth daily.   amoxicillin-clavulanate 875-125 MG tablet Commonly known as:  AUGMENTIN Take 1 tablet by mouth 2 (two) times daily.   atorvastatin 20 MG tablet Commonly known as:  LIPITOR Take 20 mg by mouth daily.   carbamazepine 200 MG tablet Commonly known as:  TEGRETOL Take 200-400 mg by mouth See admin instructions. Take 1 tablet (200 mg) by mouth every morning and 2 tablets (400 mg) at night   escitalopram 10 MG tablet Commonly known as:  LEXAPRO Take 30 mg by mouth daily.   famotidine 20 MG tablet Commonly known as:  PEPCID Take 1 tablet (20 mg total) by mouth daily. Start taking on:  12/24/5091   folic acid 1 MG tablet Commonly known as:  FOLVITE Take 1 mg by mouth daily.   levothyroxine 50 MCG  tablet Commonly known as:  SYNTHROID, LEVOTHROID Take 50 mcg by mouth daily before breakfast.   metoprolol succinate 50 MG 24 hr tablet Commonly known as:  TOPROL-XL Take 50 mg by mouth daily. Take with or immediately following a meal.   predniSONE 10 MG tablet Commonly known as:  DELTASONE Take 4 tabs daily for 3 days, then reduce to 2 tabs daily until follow up with your Lupus doctor.      Follow-up Information    [facility] Follow up.   Why:  home health  RN and PT services arranged, office will call and set up home visits Contact information: [address] [place] [address] [phone]        [name], MD. Schedule an appointment as soon as possible for a visit in 5 day(s).   Specialty:  Family Medicine Why:  To be seen with repeat labs (CBC & BMP). Contact information: [place] [address] [phone]        Primary Rheumatologist. Schedule an appointment as soon as possible for a visit in 1 week(s).   Why:  Follow up regarding your Lupus and medications (Prednisone  and others).       Primary Psychiatrist. Schedule an appointment as soon as possible for a visit in 1 week(s).        Raylene Miyamoto., MD. Schedule an appointment as soon as possible for a visit in 1 week(s).   Specialty:  Pulmonary Disease Contact information: [address] [place] [address] [phone]          No Known Allergies    Procedures/Studies: Dg Chest 1 View  Result Date: 08/11/2016 CLINICAL DATA:  Post thoracentesis, history lupus, hypertension, CHF, stroke EXAM: CHEST 1 VIEW COMPARISON:  08/10/2016 FINDINGS: Enlargement of cardiac silhouette with slight vascular congestion. Stable mediastinal contours. RIGHT pleural effusion and basilar atelectasis identified. No pneumothorax. Mild streaky LEFT atelectasis LEFT lower lobe. Bones unremarkable. IMPRESSION: No pneumothorax following  thoracentesis. Persistent RIGHT basilar atelectasis and effusion. Electronically Signed   By: Lavonia Dana M.D.   On: 08/11/2016 12:34   Dg Chest 2 View  Result Date: 08/15/2016 CLINICAL DATA:  Productive cough with recent pleural effusion EXAM: CHEST  2 VIEW COMPARISON:  Chest radiograph and chest CT Nov 08, 2016 FINDINGS: Right pleural effusion is considerably smaller compared to recent studies. There is airspace consolidation in the right lower lobe. There is mild right middle lobe atelectatic change. Left lung is clear. Heart size and pulmonary vascularity are normal. No adenopathy. No bone lesions. IMPRESSION: Extensive airspace consolidation right lower lobe. Mild atelectasis right middle lobe. Right pleural effusion much smaller compared to recent studies. Left lung remains clear. Stable cardiac silhouette. Electronically Signed   By: Lowella Grip III M.D.   On: 08/15/2016 09:27   Dg Chest 2 View  Result Date: 08/04/2016 CLINICAL DATA:  Altered mental status. EXAM: CHEST  2 VIEW COMPARISON:  05/11/2015. FINDINGS: Cardiomegaly with bilateral from interstitial prominence and bilateral pleural effusions consistent with congestive heart failure. Small bilateral pleural effusions cannot be excluded. No pneumothorax . Interposition of the colon under the right hemidiaphragm patch that noted. Abdominal series is suggested to exclude free intraperitoneal air. IMPRESSION: 1. Congestive heart failure bilateral from interstitial edema. 2. Interposition of the colon under the right hemidiaphragm. Abdominal series can be obtained for further evaluation to exclude free intraperitoneal air. Critical Value/emergent results were called by telephone at the time of interpretation on 08/04/2016 at 5:38 pm to Dr. Davonna Belling , who verbally acknowledged these results. Electronically Signed   By: Marcello Moores  Register   On: 08/04/2016 17:41   Ct Head Wo Contrast  Result Date: 08/04/2016 CLINICAL DATA:  Acute onset of  slurred speech.  Initial encounter. EXAM: CT HEAD WITHOUT CONTRAST TECHNIQUE: Contiguous axial images were obtained from the base of the skull through the vertex without intravenous contrast. COMPARISON:  CT of the head performed 12/01/2012 FINDINGS: Brain: No evidence of acute infarction, hemorrhage, hydrocephalus, extra-axial collection or mass lesion/mass effect. Mild periventricular white matter change likely reflects small vessel ischemic microangiopathy. The posterior fossa, including the cerebellum, brainstem and fourth ventricle, is within normal limits. The third and lateral ventricles, and basal ganglia are unremarkable in appearance. The cerebral hemispheres are symmetric in appearance, with normal gray-white differentiation. No mass effect or midline shift is seen. Vascular: No hyperdense vessel or unexpected calcification. Skull: There is no evidence of fracture; visualized osseous structures are unremarkable in appearance. Sinuses/Orbits: The orbits are within normal limits. The paranasal sinuses and mastoid air cells are well-aerated. Other: No significant soft tissue abnormalities are seen. IMPRESSION: 1. No acute intracranial pathology seen on CT. 2.  Mild small vessel ischemic microangiopathy. Electronically Signed   By: Garald Balding M.D.   On: 08/04/2016 17:25   Ct Chest Wo Contrast  Result Date: 08/11/2016 CLINICAL DATA:  Cough.  Right pleural effusion. EXAM: CT CHEST WITHOUT CONTRAST TECHNIQUE: Multidetector CT imaging of the chest was performed following the standard protocol without IV contrast. COMPARISON:  Current chest radiograph and prior chest radiographs. FINDINGS: Cardiovascular: Heart is normal in size. There is a small pericardial effusion. The great vessels are normal in caliber. No aortic or coronary artery atherosclerotic calcifications. Mediastinum/Nodes: No neck base or axillary masses or adenopathy. No mediastinal or hilar masses or enlarged lymph nodes. Esophagus, trachea  and mainstem bronchi are unremarkable. Lungs/Pleura: Moderate size right pleural effusion. There is dependent opacity within the right lower lobe and at the base of the right middle lobe. This is consistent with either atelectasis, pneumonia or a combination. Small area of opacity is noted along the pleural surface of the anteromedial right upper lobe near the minor fissure, also likely atelectasis. There is dependent opacity in the left lower lobe associated with mild bronchiolectasis. The opacity is likely atelectasis. No evidence of pulmonary edema. There are moderate changes of emphysema most evident in the upper lobes. No pneumothorax.  No left pleural effusion. Upper Abdomen: No acute abnormality. Musculoskeletal: No chest wall mass or suspicious bone lesions identified. IMPRESSION: 1. Moderate right pleural effusion. There is dependent right lower lobe and, to lesser degree, right middle lobe opacity. This may all be atelectasis. A component of pneumonia should be suspected in the proper clinical setting. 2. Minor dependent left lower lobe and anterior inferior right upper lobe opacity that is likely atelectasis. 3. No pulmonary edema. 4. Small pericardial effusion. 5. Moderate centrilobular emphysema. Electronically Signed   By: Lajean Manes M.D.   On: 08/11/2016 16:59   Mr Brain Wo Contrast  Result Date: 08/06/2016 CLINICAL DATA:  Altered mental status.  Lupus. EXAM: MRI HEAD WITHOUT CONTRAST TECHNIQUE: Multiplanar, multiecho pulse sequences of the brain and surrounding structures were obtained without intravenous contrast. COMPARISON:  Head CT from 2 days ago FINDINGS: Only axial diffusion and FLAIR imaging could be obtained. Axial T2 sequence was obtained but is nondiagnostic due to excessive motion. FLAIR imaging shows no edema, mass, hydrocephalus, or notable white matter disease. Normal brain volume for age. Two punctate foci DWI hyperintensity in the inferior cerebellum is likely artifact at the  skullbase. Elsewhere negative diffusion scan. IMPRESSION: Incomplete study as described.  Obtained sequences are unremarkable. Electronically Signed   By: Monte Fantasia M.D.   On: 08/06/2016 11:28   Dg Chest Port 1 View  Result Date: 08/10/2016 CLINICAL DATA:  Pulmonary edema EXAM: PORTABLE CHEST 1 VIEW COMPARISON:  August 09, 2016 FINDINGS: Dense opacification of the right mid lower lung is stable. Bilateral increased interstitial markings are stable. The cardiomediastinal silhouette is unchanged. No pneumothorax. IMPRESSION: 1. Persistent opacification of the right mid lower lung. 2. Persistent increased interstitial opacities are stable. Electronically Signed   By: Dorise Bullion III M.D   On: 08/10/2016 07:48   Dg Chest Port 1 View  Result Date: 08/09/2016 CLINICAL DATA:  Shortness of breath. History of collagen vascular disease, CHF, hypertension. EXAM: PORTABLE CHEST 1 VIEW COMPARISON:  Chest x-ray dated 08/05/2016. FINDINGS: Large dense opacity now occupying the right mid and lower lung zones, suspected pleural effusion. Mild interstitial prominence persist bilaterally, presumed interstitial edema, decreased compared to the earlier exam. No pneumothorax seen. Heart size difficult to assess, with  right border obscured by the right lower lobe density. IMPRESSION: Large dense opacity now occupying the right mid and lower lung zones, most likely a large pleural effusion, possibly accentuated by adjacent atelectasis. If febrile, could not exclude superimposed pneumonia. Electronically Signed   By: Franki Cabot M.D.   On: 08/09/2016 18:30   Dg Chest Port 1 View  Result Date: 08/05/2016 CLINICAL DATA:  Ends hypoxia, tachypnea, dyspnea. EXAM: PORTABLE CHEST 1 VIEW COMPARISON:  08/05/2016 at 09:35 FINDINGS: Diffuse vascular and interstitial prominence, slightly improved from the earlier exam. Reduced central/ basilar airspace opacities. No confluent consolidation. IMPRESSION: Mild improvement.  Electronically Signed   By: Andreas Newport M.D.   On: 08/05/2016 20:40   Dg Chest Port 1 View  Result Date: 08/05/2016 CLINICAL DATA:  HYPOXIA.HX HTN,LUPUS EXAM: PORTABLE CHEST - 1 VIEW COMPARISON:  08/04/2016 FINDINGS: Significant progression of bilateral interstitial edema or infiltrates. Some airspace opacities in the lung bases are slightly more conspicuous. Heart size upper limits normal for technique. No definite effusion.  No pneumothorax. Visualized bones unremarkable. IMPRESSION: 1. Worsening bilateral edema/infiltrates. Electronically Signed   By: Lucrezia Europe M.D.   On: 08/05/2016 09:54   Dg Abd 2 Views  Result Date: 08/04/2016 CLINICAL DATA:  Question pneumoperitoneum on recent chest radiography EXAM: ABDOMEN - 2 VIEW COMPARISON:  08/04/2016 FINDINGS: Supine and left lateral decubitus imaging is negative for pneumoperitoneum. There is moderate gaseous distention of the colon. IMPRESSION: No evidence of pneumoperitoneum. Electronically Signed   By: Andreas Newport M.D.   On: 08/04/2016 19:37   US Thoracentesis Asp Pleural Space W/img Guide  Result Date: 08/11/2016 INDICATION: Patient with weakness and worsening dyspnea found to have a right pleural effusion. Request is made for diagnostic and therapeutic thoracentesis. EXAM: ULTRASOUND GUIDED DIAGNOSTIC AND THERAPEUTIC RIGHT THORACENTESIS MEDICATIONS: 10 mL 1% lidocaine COMPLICATIONS: None immediate. PROCEDURE: An ultrasound guided thoracentesis was thoroughly discussed with the patient and questions answered. The benefits, risks, alternatives and complications were also discussed. The patient understands and wishes to proceed with the procedure. Written consent was obtained. Ultrasound was performed to localize and mark an adequate pocket of fluid in the right chest. The area was then prepped and draped in the normal sterile fashion. 1% Lidocaine was used for local anesthesia. Under ultrasound guidance a Safe-T-centesis was introduced.  After 64m of fluid removed, resistance was met. The Safe-T-centesis catheter was removed. A 19 gauge, 7-cm, Yueh catheter was then introduced with 5 mL fluid removed, but again significant resistance met when drawing fluid. Procedure was stopped. The catheter was removed and a dressing applied. FINDINGS: A total of approximately 40 mL of hazy, yellow fluid was removed. Samples were sent to the laboratory as requested by the clinical team. IMPRESSION: Successful ultrasound guided diagnostic and therapeutic right thoracentesis yielding 40 mL of pleural fluid. Read by:  KBrynda GreathousePA-C Electronically Signed   By: JCorrie MckusickD.O.   On: 08/11/2016 12:30   Dg Fluoro Guide Lumbar Puncture  Result Date: 08/06/2016 CLINICAL DATA:  Cerebritis. EXAM: DIAGNOSTIC LUMBAR PUNCTURE UNDER FLUOROSCOPIC GUIDANCE FLUOROSCOPY TIME:  Fluoroscopy Time:  0.3 minutes Radiation Exposure Index (if provided by the fluoroscopic device): 1.8 mGy Number of Acquired Spot Images: 0 PROCEDURE: Informed consent was obtained from the patient's husband prior to the procedure, including potential complications of headache, allergy, and pain. With the patient prone, the lower back was prepped with Betadine. 1% Lidocaine was used for local anesthesia. Lumbar puncture was performed at the L3-4 level using a 20 gauge  needle with return of clear CSF. Due to patient condition, decubitus positioning for accurate opening pressure quantification was not safe. 7 ml of CSF were obtained for laboratory studies. The patient tolerated the procedure well and there were no apparent complications. IMPRESSION: Successful lumbar puncture at L3-4. Electronically Signed   By: Monte Fantasia M.D.   On: 08/06/2016 09:52      Subjective: Patient denies complaints. Has been off of oxygen. Denies dyspnea, cough or chest pain. As per RN, ambulated in the hall without hypoxia. No new complaints reported by spouse at bedside.  Discharge Exam:  Vitals:    08/15/16 2150 08/16/16 0533 08/16/16 0801 08/16/16 1455  BP: (!) 146/71 (!) 151/82 (!) 159/80 127/82  Pulse: 75 69 79 82  Resp: _0 Temp: 98.5 F (36.9 C) 98.4 F (36.9 C) 98.4 F (36.9 C) 98.4 F (36.9 C)  TempSrc: Oral Oral Oral Oral  SpO2: 94% 91% 96% 97%  Weight: 87.5 kg (192 lb 14.4 oz)     Height:        General exam: Pleasant middle-aged female sitting up comfortably in bed without distress. Respiratory system:  reduced breath sounds in the bases right >left. Rest of lung fields clear to auscultation. Respiratory effort normal. Cardiovascular system: S1 & S2 heard, RRR. No JVD, murmurs, rubs, gallops or clicks. No pedal edema.  Gastrointestinal system: Abdomen is nondistended, soft and nontender. No organomegaly or masses felt. Normal bowel sounds heard. Central nervous system: Alert and oriented. No focal neurological deficits. Extremities: Symmetric 5 x 5 power. Skin: No rashes, lesions or ulcers Psychiatry: Judgement and insight appear normal. Mood & affect appropriate.     The results of significant diagnostics from this hospitalization (including imaging, microbiology, ancillary and laboratory) are listed below for reference.     Microbiology: Recent Results (from the past 240 hour(s))  Culture, blood (routine x 2)     Status: None   Collection Time: 08/09/16  6:34 PM  Result Value Ref Range Status   Specimen Description BLOOD RIGHT ANTECUBITAL  Final   Special Requests BOTTLES DRAWN AEROBIC AND ANAEROBIC  10CC EA  Final   Culture NO GROWTH 5 DAYS  Final   Report Status 08/14/2016 FINAL  Final  Culture, blood (routine x 2)     Status: None   Collection Time: 08/09/16  6:36 PM  Result Value Ref Range Status   Specimen Description BLOOD RIGHT HAND  Final   Special Requests BOTTLES DRAWN AEROBIC AND ANAEROBIC 5CC EA  Final   Culture NO GROWTH 5 DAYS  Final   Report Status 08/14/2016 FINAL  Final  Respiratory Panel by PCR     Status: None   Collection  Time: 08/10/16  8:18 AM  Result Value Ref Range Status   Adenovirus NOT DETECTED NOT DETECTED Final   Coronavirus 229E NOT DETECTED NOT DETECTED Final   Coronavirus HKU1 NOT DETECTED NOT DETECTED Final   Coronavirus NL63 NOT DETECTED NOT DETECTED Final   Coronavirus OC43 NOT DETECTED NOT DETECTED Final   Metapneumovirus NOT DETECTED NOT DETECTED Final   Rhinovirus / Enterovirus NOT DETECTED NOT DETECTED Final   Influenza A NOT DETECTED NOT DETECTED Final   Influenza B NOT DETECTED NOT DETECTED Final   Parainfluenza Virus 1 NOT DETECTED NOT DETECTED Final   Parainfluenza Virus 2 NOT DETECTED NOT DETECTED Final   Parainfluenza Virus 3 NOT DETECTED NOT DETECTED Final   Parainfluenza Virus 4 NOT DETECTED NOT DETECTED Final   Respiratory  Syncytial Virus NOT DETECTED NOT DETECTED Final   Bordetella pertussis NOT DETECTED NOT DETECTED Final   Chlamydophila pneumoniae NOT DETECTED NOT DETECTED Final   Mycoplasma pneumoniae NOT DETECTED NOT DETECTED Final  Culture, Urine     Status: None   Collection Time: 08/10/16  5:37 PM  Result Value Ref Range Status   Specimen Description URINE, CLEAN CATCH  Final   Special Requests NONE  Final   Culture NO GROWTH  Final   Report Status 08/11/2016 FINAL  Final  MRSA PCR Screening     Status: None   Collection Time: 08/11/16  8:08 AM  Result Value Ref Range Status   MRSA by PCR NEGATIVE NEGATIVE Final    Comment:        The GeneXpert MRSA Assay (FDA approved for NASAL specimens only), is one component of a comprehensive MRSA colonization surveillance program. It is not intended to diagnose MRSA infection nor to guide or monitor treatment for MRSA infections.   Culture, body fluid-bottle     Status: None   Collection Time: 08/11/16 12:23 PM  Result Value Ref Range Status   Specimen Description PLEURAL RIGHT  Final   Special Requests NONE  Final   Culture NO GROWTH 5 DAYS  Final   Report Status 08/16/2016 FINAL  Final  Gram stain     Status:  None   Collection Time: 08/11/16 12:23 PM  Result Value Ref Range Status   Specimen Description PLEURAL RIGHT  Final   Special Requests NONE  Final   Gram Stain   Final    RARE WBC PRESENT, PREDOMINANTLY PMN NO ORGANISMS SEEN    Report Status 08/11/2016 FINAL  Final     Labs: BNP (last 3 results)  Recent Labs  08/05/16 0405  BNP 073.7*   Basic Metabolic Panel:  Recent Labs Lab 08/10/16 0059 08/11/16 0141 08/12/16 0354 08/13/16 0651 08/14/16 0318 08/16/16 0526  NA 139 140 137 138 136 137  K 4.2 4.2 4.1 3.6 3.7 3.9  CL 103 104 100* 101 104 101  CO2 _0 GLUCOSE 134* 117* 143* 97 93 97  BUN 30* 44* 53* 43* 37* 26*  CREATININE 1.33* 1.62* 1.78* 1.58* 1.39* 1.37*  CALCIUM 8.6* 8.7* 8.8* 8.7* 8.5* 8.6*  MG 2.1 2.2 2.3 2.3  --   --    Liver Function Tests:  Recent Labs Lab 08/11/16 1601 08/12/16 0354 08/14/16 0318  AST  --  113* 33  ALT  --  75* 46  ALKPHOS  --  70 69  BILITOT  --  0.4 0.7  PROT 6.7 6.7 6.1*  ALBUMIN  --  2.1* 2.1*   CBC:  Recent Labs Lab 08/10/16 0059 08/11/16 0819 08/12/16 0354 08/13/16 0651 08/16/16 0526  WBC 36.8* 25.7* 15.5* 9.9 9.8  NEUTROABS 30.9* 22.4* 13.8* 6.0  --   HGB 9.3* 9.5* 9.1* 8.8* 9.0*  HCT 27.4* 27.3* 27.3* 26.7* 27.1*  MCV 88.7 89.5 88.3 88.4 91.9  PLT 439* 437* 416* 425* 525*   Cardiac Enzymes:  Recent Labs Lab 08/09/16 1937 08/10/16 0059 08/10/16 0629  TROPONINI 0.05* 0.03* 0.05*   Urinalysis    Component Value Date/Time   COLORURINE YELLOW 08/04/2016 2018   APPEARANCEUR HAZY (A) 08/04/2016 2018   LABSPEC 1.020 08/04/2016 2018   PHURINE 5.0 08/04/2016 2018   GLUCOSEU NEGATIVE 08/04/2016 2018   HGBUR NEGATIVE 08/04/2016 2018   BILIRUBINUR NEGATIVE 08/04/2016 2018   BILIRUBINUR n 07/20/2013 [place]  NEGATIVE 08/04/2016 2018   PROTEINUR 100 (A) 08/04/2016 2018   UROBILINOGEN 0.2 05/11/2015 2201   NITRITE NEGATIVE 08/04/2016 2018   LEUKOCYTESUR NEGATIVE 08/04/2016 2018     Discussed in detail with patient's spouse at bedside. Updated care and answered questions. Stressed with both patient and spouse regarding the importance of outpatient follow-up with all physicians listed above and they verbalized understanding.   Time coordinating discharge: Over 30 minutes  SIGNED:  Vernell Leep, MD, FACP, [place]. [facility] Pager [phone]  If 7PM-7AM, please contact night-coverage [email or web address] Password [facility] 08/16/2016, 4:58 PM

## 2016-08-25 ENCOUNTER — Ambulatory Visit (INDEPENDENT_AMBULATORY_CARE_PROVIDER_SITE_OTHER): Payer: BLUE CROSS/BLUE SHIELD | Admitting: Acute Care

## 2016-08-25 ENCOUNTER — Encounter: Payer: Self-pay | Admitting: Acute Care

## 2016-08-25 ENCOUNTER — Ambulatory Visit (INDEPENDENT_AMBULATORY_CARE_PROVIDER_SITE_OTHER)
Admission: RE | Admit: 2016-08-25 | Discharge: 2016-08-25 | Disposition: A | Discharge status: Home / Self Care | Payer: BLUE CROSS/BLUE SHIELD | Source: Ambulatory Visit | Attending: Acute Care | Admitting: Acute Care

## 2016-08-25 VITALS — BP 130/84 | HR 104 | Ht 66.5 in | Wt 188.2 lb

## 2016-08-25 DIAGNOSIS — J9601 Acute respiratory failure with hypoxia: Secondary | ICD-10-CM | POA: Diagnosis not present

## 2016-08-25 DIAGNOSIS — J189 Pneumonia, unspecified organism: Secondary | ICD-10-CM

## 2016-08-25 DIAGNOSIS — J90 Pleural effusion, not elsewhere classified: Secondary | ICD-10-CM

## 2016-08-25 DIAGNOSIS — M3213 Lung involvement in systemic lupus erythematosus: Secondary | ICD-10-CM | POA: Diagnosis not present

## 2016-08-25 DIAGNOSIS — I272 Pulmonary hypertension, unspecified: Secondary | ICD-10-CM

## 2016-08-25 NOTE — Assessment & Plan Note (Addendum)
Clinically improving No Fever Room Air saturations 93% Plan: CXR today indicates mild resolution of right basilar opacity. Follow up CXR in 4 weeks to ensure continued improvement. Please contact office for sooner follow up if symptoms do not improve or worsen or seek emergency care

## 2016-08-25 NOTE — Patient Instructions (Addendum)
We will check a CXR today. We will call you with the results Continue you medications as you have been doing. Follow up with you Rheumatoidologist. Follow up with Primary Care as is scheduled. Follow up with Dr. [name] in 2 months to ensure you are continuing to do well. May need PFT's/ Spirometry at follow up. Please contact office for sooner follow up if symptoms do not improve or worsen or seek emergency care

## 2016-08-25 NOTE — Assessment & Plan Note (Addendum)
Mildly Elevated per Echo 9/68/8648 Systolic pressure was mildly increased. PA   peak pressure: 39 mm Hg (S).  Plan: Continue to follow/ monitor. L and R heart Cath if s/s progression Evaluate for use of vasodilator therapy if progresses.

## 2016-08-25 NOTE — Assessment & Plan Note (Addendum)
Clinically improving Plan: CXR today indicated Mild improvement of the right basilar opacity with continued associated small effusion.  4 week repeat CXR to ensure continued resolution. Follow up with Dr. [name] in 8 weeks Please contact office for sooner follow up if symptoms do not improve or worsen or seek emergency care

## 2016-08-25 NOTE — Assessment & Plan Note (Addendum)
Follow up with Rheumatoidologist as is scheduled Continue Prednisone maintenance dose. Follow up with Dr. [name] in 2 months to ensure you are continuing to do well Consider FPT's / Spirometry at follow up.

## 2016-08-25 NOTE — Progress Notes (Addendum)
History of Present Illness Anna [name] is a 56 y.o. female with CHF, HTN, HLD, depression, lupus (on plaqenil & methotrexate) and CVA.She has grade 2 diastolic dysfunction and mildly elevated PASP ( 39 mm Hg). She was seen as an inpatient by Dr. Titus Mould.    08/25/2016 Hospital Follow Up: Pt. Presents for hospital follow up. She was admitted 08/04/2016-08/16/2016 for 2 month history of progressive malaise, generalized weakness and acute worsening of dyspnea with mental status changes . Principle problem was Acute Diastolic Failure, but she developed a HAP and pleural effusion ( exudative by lights criteria LDH 982), WBC 712.   ) which required Thoracentesis. The effusion was likely consistent with infection and residual pleural fluid that did  not appear loculated. Pulmonary saw her as an inpatient and  recommended checkinging for lupus activity (CH 50 elevated, C3 &C4 normal, anti-DNA antibody: 55 and anti-scleroderma antibody <0.2) and to continue antibiotics, IV steroids  and follow clinically.She improved, was transitioned to prednisone and Augmentin.She was discharged home on Augmentin x 10 days, which she completed.She was on a prednisone taper x 4 days. She is now on her maintenance dose per her rheumatoidologist of 20 mg daily.She was weaned off her oxygen to RA. She is saturating 92% on Room Air in the office today. She states she has some shortness of breath in the morning, but it improves as the day progresses.She has a non-productive cough, very infrequently. She has a scheduled appointment with her rheumatoidologist,  We discussed the importance of following up with him. She denies chest pain or fever, orthopnea or hemoptysis. Thoracentesis sight is healed. She appears to be doing well.She has quit smoking. I congratulated her, and encouraged her to remain smoke free. She states that the thoracentesis experience was enough to cure her from smoking forever. !!  I spent 3 minutes  counseling on continued tobacco abstinence this visit.  Tests CXR 08/25/2016>>IMPRESSION: Mild improvement of the right basilar opacity with continued associated small effusion. Recommend follow-up to resolution.  Echo 08/06/2016>> Left ventricle: The cavity size was normal. Wall thickness was   increased in a pattern of mild LVH. Systolic function was normal.   The estimated ejection fraction was in the range of 60% to 65%.   Wall motion was normal; there were no regional wall motion   abnormalities. Features are consistent with a pseudonormal left   ventricular filling pattern, with concomitant abnormal relaxation   and increased filling pressure (grade 2 diastolic dysfunction). - Mitral valve: There was mild regurgitation. Valve area by   pressure half-time: 1.72 cm^2. - Left atrium: The atrium was moderately dilated. - Pulmonary arteries: Systolic pressure was mildly increased. PA   peak pressure: 39 mm Hg (S). - Pericardium, extracardiac: A trivial pericardial effusion was   identified.   Past medical hx Past Medical History:  Diagnosis Date  . Acute CHF ([place]) 07/2016  . Depressive disorder, not elsewhere classified   . Family history of adverse reaction to anesthesia    mother passed away during surgery  . Hypertension   . Hypothyroid    Dr. Wilson Singer  . Kidney stone   . Lupus (systemic lupus erythematosus) ([place])   . Migraine   . Pure hypercholesterolemia   . Stroke [facility])      Past surgical hx, Family hx, Social hx all reviewed.  Current Outpatient Prescriptions on File Prior to Visit  Medication Sig  . amLODipine (NORVASC) 10 MG tablet Take 10 mg by mouth daily.  [name] atorvastatin (  LIPITOR) 20 MG tablet Take 20 mg by mouth daily.  . carbamazepine (TEGRETOL) 200 MG tablet Take 200-400 mg by mouth See admin instructions. Take 1 tablet (200 mg) by mouth every morning and 2 tablets (400 mg) at night  . escitalopram (LEXAPRO) 10 MG tablet Take 30 mg by mouth daily.  .  famotidine (PEPCID) 20 MG tablet Take 1 tablet (20 mg total) by mouth daily.  . folic acid (FOLVITE) 1 MG tablet Take 1 mg by mouth daily.  [name] levothyroxine (SYNTHROID, LEVOTHROID) 50 MCG tablet Take 50 mcg by mouth daily before breakfast.  . metoprolol succinate (TOPROL-XL) 50 MG 24 hr tablet Take 50 mg by mouth daily. Take with or immediately following a meal.  . predniSONE (DELTASONE) 10 MG tablet Take 4 tabs daily for 3 days, then reduce to 2 tabs daily until follow up with your Lupus doctor.   No current facility-administered medications on file prior to visit.      No Known Allergies  Review Of Systems:  Constitutional:   No  weight loss, night sweats,  Fevers, chills,+ fatigue, or  lassitude.  HEENT:   No headaches,  Difficulty swallowing,  Tooth/dental problems, or  Sore throat,                No sneezing, itching, ear ache, nasal congestion, post nasal drip,   CV:  No chest pain,  Orthopnea, PND, swelling in lower extremities, anasarca, dizziness, palpitations, syncope.   GI  No heartburn, indigestion, abdominal pain, nausea, vomiting, diarrhea, change in bowel habits, loss of appetite, bloody stools.   Resp: + shortness of breath just in the morning with exertion less at  rest.  No excess mucus, + productive cough,  No non-productive cough,  No coughing up of blood.  No change in color of mucus.  No wheezing.  No chest wall deformity  Skin: no rash or lesions.  GU: no dysuria, change in color of urine, no urgency or frequency.  No flank pain, no hematuria   MS:  No joint pain or swelling.  No decreased range of motion.  No back pain.  Psych:  No change in mood or affect. No depression or anxiety.  No memory loss.   Vital Signs BP 130/84 (BP Location: Left Arm, Cuff Size: Normal)   Pulse (!) 104   Ht 5' 6.5" (1.689 m)   Wt 188 lb 3.2 oz (85.4 kg)   LMP 07/07/2002   SpO2 92%   BMI 29.92 kg/m    Physical Exam:  General- No distress,  A&Ox3, appropriate ENT: No  sinus tenderness, TM clear, pale nasal mucosa, no oral exudate,no post nasal drip, no LAN Cardiac: S1, S2, regular rate and rhythm, no murmur Chest: No wheeze/ rales/ dullness; no accessory muscle use, no nasal flaring, no sternal retractions Abd.: Soft Non-tender, obese Ext: No clubbing cyanosis, edema Neuro:  normal strength, slightly deconditioned with hospitalization. Skin: No rashes, warm and dry Psych: normal mood and behavior   Assessment/Plan  Pleural effusion Clinically improving Plan: CXR today indicated Mild improvement of the right basilar opacity with continued associated small effusion.  4 week repeat CXR to ensure continued resolution. Follow up with Dr. [name] in 8 weeks Please contact office for sooner follow up if symptoms do not improve or worsen or seek emergency care   HCAP (healthcare-associated pneumonia) Clinically improving No Fever Room Air saturations 93% Plan: CXR today indicates mild resolution of right basilar opacity. Follow up CXR in 4 weeks to ensure  continued improvement. Please contact office for sooner follow up if symptoms do not improve or worsen or seek emergency care    SLE (systemic lupus erythematosus) ([place]) Follow up with Rheumatoidologist as is scheduled Continue Prednisone maintenance dose. Follow up with Dr. [name] in 2 months to ensure you are continuing to do well Consider FPT's / Spirometry at follow up.  Pulmonary hypertension Mildly Elevated per Echo 01/10/8674 Systolic pressure was mildly increased. PA   peak pressure: 39 mm Hg (S).  Plan: Continue to follow/ monitor. L and R heart Cath if s/s progression Evaluate for use of vasodilator therapy if progresses.    Magdalen Spatz, NP 08/25/2016  6:31 PM

## 2016-08-26 ENCOUNTER — Other Ambulatory Visit: Payer: Self-pay | Admitting: Acute Care

## 2016-08-26 DIAGNOSIS — R0600 Dyspnea, unspecified: Secondary | ICD-10-CM

## 2016-08-26 DIAGNOSIS — J90 Pleural effusion, not elsewhere classified: Secondary | ICD-10-CM

## 2016-09-02 ENCOUNTER — Ambulatory Visit (INDEPENDENT_AMBULATORY_CARE_PROVIDER_SITE_OTHER)
Admission: RE | Admit: 2016-09-02 | Discharge: 2016-09-02 | Disposition: A | Discharge status: Home / Self Care | Payer: BLUE CROSS/BLUE SHIELD | Source: Ambulatory Visit | Attending: Acute Care | Admitting: Acute Care

## 2016-09-02 ENCOUNTER — Other Ambulatory Visit: Payer: Self-pay

## 2016-09-02 DIAGNOSIS — J90 Pleural effusion, not elsewhere classified: Secondary | ICD-10-CM | POA: Diagnosis not present

## 2016-09-02 DIAGNOSIS — R0600 Dyspnea, unspecified: Secondary | ICD-10-CM | POA: Diagnosis not present

## 2016-09-02 DIAGNOSIS — R0602 Shortness of breath: Secondary | ICD-10-CM

## 2016-09-26 ENCOUNTER — Other Ambulatory Visit: Payer: Self-pay | Admitting: Nephrology

## 2016-09-26 DIAGNOSIS — N183 Chronic kidney disease, stage 3 unspecified: Secondary | ICD-10-CM

## 2016-10-06 ENCOUNTER — Other Ambulatory Visit: Payer: BLUE CROSS/BLUE SHIELD

## 2016-10-07 ENCOUNTER — Ambulatory Visit
Admission: RE | Admit: 2016-10-07 | Discharge: 2016-10-07 | Disposition: A | Discharge status: Home / Self Care | Payer: BLUE CROSS/BLUE SHIELD | Source: Ambulatory Visit | Attending: Nephrology | Admitting: Nephrology

## 2016-10-07 DIAGNOSIS — N183 Chronic kidney disease, stage 3 unspecified: Secondary | ICD-10-CM

## 2016-10-23 ENCOUNTER — Encounter: Payer: Self-pay | Admitting: Pulmonary Disease

## 2016-10-23 ENCOUNTER — Ambulatory Visit (INDEPENDENT_AMBULATORY_CARE_PROVIDER_SITE_OTHER)
Admission: RE | Admit: 2016-10-23 | Discharge: 2016-10-23 | Disposition: A | Discharge status: Home / Self Care | Payer: BLUE CROSS/BLUE SHIELD | Source: Ambulatory Visit | Attending: Pulmonary Disease | Admitting: Pulmonary Disease

## 2016-10-23 ENCOUNTER — Ambulatory Visit (INDEPENDENT_AMBULATORY_CARE_PROVIDER_SITE_OTHER): Payer: BLUE CROSS/BLUE SHIELD | Admitting: Pulmonary Disease

## 2016-10-23 VITALS — BP 130/92 | HR 97 | Ht 65.5 in | Wt 195.4 lb

## 2016-10-23 DIAGNOSIS — Z09 Encounter for follow-up examination after completed treatment for conditions other than malignant neoplasm: Secondary | ICD-10-CM

## 2016-10-23 NOTE — Patient Instructions (Signed)
The chest x-ray is reviewed with you today. Follow up in 3 months

## 2016-10-23 NOTE — Progress Notes (Signed)
Anna [name]    [id number]    10/25/1960  Primary Care Physician:HOWELL, Bryn Gulling, MD  Referring Physician: [name], MD [place],  [id number]  Chief complaint:   Follow-up after hospitalization for pneumonia  HPI: 56 year old with heart failure, hypertension, hyperlipidemia, depression, lupus (Plaquenil and methotrexate], CVA. She was admitted in February 2018 for pneumonia, parapneumonic effusion which was evaluated by thoracentesis. It was exudative by light's criteria [LDH 982]. She had an autoimmune workup which was not remarkable. She has been treated with antibiotics, prednisone and discharged. She is here for follow-up and reports stable pulmonary symptoms. She denies any cough, sputum production, dyspnea, wheezing.  She has history of lupus and is on methotrexate. She was on prednisone previously but not at present. She was told to follow-up with her rheumatologist on discharge but is not on it yet.  Outpatient Encounter Prescriptions as of 10/23/2016  Medication Sig  . amLODipine (NORVASC) 10 MG tablet Take 10 mg by mouth daily.  [name] atorvastatin (LIPITOR) 20 MG tablet Take 20 mg by mouth daily.  . carbamazepine (TEGRETOL) 200 MG tablet Take 200-400 mg by mouth See admin instructions. Take 1 tablet (200 mg) by mouth every morning and 2 tablets (400 mg) at night  . diazepam (VALIUM) 10 MG tablet Take 10 mg by mouth every 6 (six) hours as needed for anxiety.  [name] escitalopram (LEXAPRO) 10 MG tablet Take 30 mg by mouth daily.  . folic acid (FOLVITE) 1 MG tablet Take 1 mg by mouth daily.  [name] levothyroxine (SYNTHROID, LEVOTHROID) 50 MCG tablet Take 50 mcg by mouth daily before breakfast.  . metoprolol succinate (TOPROL-XL) 50 MG 24 hr tablet Take 50 mg by mouth daily. Take with or immediately following a meal.  . QUEtiapine (SEROQUEL) 200 MG tablet Take 200 mg by mouth at bedtime.  . [DISCONTINUED] famotidine (PEPCID) 20 MG tablet  Take 1 tablet (20 mg total) by mouth daily.  . [DISCONTINUED] predniSONE (DELTASONE) 10 MG tablet Take 4 tabs daily for 3 days, then reduce to 2 tabs daily until follow up with your Lupus doctor.   No facility-administered encounter medications on file as of 10/23/2016.     Allergies as of 10/23/2016  . (No Known Allergies)    Past Medical History:  Diagnosis Date  . Acute CHF ([place]) 07/2016  . Depressive disorder, not elsewhere classified   . Family history of adverse reaction to anesthesia    mother passed away during surgery  . Hypertension   . Hypothyroid    Dr. Wilson Singer  . Kidney stone   . Lupus (systemic lupus erythematosus) ([place])   . Migraine   . Pure hypercholesterolemia   . Stroke [facility])     Past Surgical History:  Procedure Laterality Date  . CESAREAN SECTION    . ECTOPIC PREGNANCY SURGERY     times 2  . LAPAROSCOPY     with rt salpingectomy  . [place] SURGERY  2003  . TONSILLECTOMY    . TOTAL ABDOMINAL HYSTERECTOMY  2004   ovaries retained, DUB  . TUBAL LIGATION  1985    Family History  Problem Relation Age of Onset  . Diabetes Mother   . Hypertension Sister   . Diabetes Maternal Grandmother   . Autoimmune disease Neg Hx     Social History   Social History  . Marital status: Married    Spouse name: Ludwig Clarks  . Number of children:  1  . Years of education: 15   Occupational History  .  Unemployed   Social History Main Topics  . Smoking status: Current Every Day Smoker    Packs/day: 0.50    Years: 35.00    Types: Cigarettes    Last attempt to quit: 08/04/2016  . Smokeless tobacco: Never Used     Comment: Continued cessation encouraged  . Alcohol use Yes     Comment: 1 drink every 3-4 months.  . Drug use: No  . Sexual activity: Yes    Partners: Male    Birth control/ protection: Surgical     Comment: TAH   Other Topics Concern  . Not on file   Social History Narrative   56  Patient is married Emergency planning/management officer) and lives at home with her husband.    Patient has one son, lives in [place].   Patient is disabled.   Patient has a college education.   Patient is right-handed.   Patient drinks some caffeine.    Review of systems: Review of Systems  Constitutional: Negative for fever and chills.  HENT: Negative.   Eyes: Negative for blurred vision.  Respiratory: as per HPI  Cardiovascular: Negative for chest pain and palpitations.  Gastrointestinal: Negative for vomiting, diarrhea, blood per rectum. Genitourinary: Negative for dysuria, urgency, frequency and hematuria.  Musculoskeletal: Negative for myalgias, back pain and joint pain.  Skin: Negative for itching and rash.  Neurological: Negative for dizziness, tremors, focal weakness, seizures and loss of consciousness.  Endo/Heme/Allergies: Negative for environmental allergies.  Psychiatric/Behavioral: Negative for depression, suicidal ideas and hallucinations.  All other systems reviewed and are negative.  Physical Exam: Blood pressure (!) 130/92, pulse 97, height 5' 5.5" (1.664 m), weight 195 lb 6.4 oz (88.6 kg), last menstrual period 07/07/2002, SpO2 95 %. Gen:      No acute distress HEENT:  EOMI, sclera anicteric Neck:     No masses; no thyromegaly Lungs:    Clear to auscultation bilaterally; normal respiratory effort CV:         Regular rate and rhythm; no murmurs Abd:      + bowel sounds; soft, non-tender; no palpable masses, no distension Ext:    No edema; adequate peripheral perfusion Skin:      Warm and dry; no rash Neuro: alert and oriented x 3 Psych: normal mood and affect  Data Reviewed: CT chest 08/11/16-right lower lobe consolidation with moderate right effusion, moderate emphysema Chest x-ray 09/02/16-patchy infiltrate over the right lower lobe, improvement in pleural effusion. Chest x-ray 10/23/16- clear lungs. I have reviewed all images personally  Echo 08/06/16 - Left ventricle: The cavity size was normal. Wall thickness was   increased in a pattern of mild  LVH. Systolic function was normal.   The estimated ejection fraction was in the range of 60% to 65%.   Wall motion was normal; there were no regional wall motion   abnormalities. Features are consistent with a pseudonormal left   ventricular filling pattern, with concomitant abnormal relaxation   and increased filling pressure (grade 2 diastolic dysfunction). - Mitral valve: There was mild regurgitation. Valve area by   pressure half-time: 1.72 cm^2. - Left atrium: The atrium was moderately dilated. - Pulmonary arteries: Systolic pressure was mildly increased. PA   peak pressure: 39 mm Hg (S). - Pericardium, extracardiac: A trivial pericardial effusion was   identified.  Serologies 08/13/16 Double-stranded DNA-55, C3-139, C4-5 SCL 70 negative  Assessment:  Recent admission for pneumonia, parapneumonic effusion She continues to improve.  I had reviewed her x-ray for today which shows complete resolution of the right lower lobe opacity and pleural effusion.  History of lupus Continue methotrexate as prescribed. Please follow up with a rheumatologist for further management of your medications  Pulmonary hypertension Mildly elevated systolic pressure noted at last admission. Will continue to follow this  Smoker Continue to smoke. Smoking cessation encouraged. Get PFTs  Plan/Recommendations: - Smoking cessation - PFTs   Marshell Garfinkel MD [facility] Pager [phone] 10/23/2016, 2:38 PM  CC: [name], MD

## 2017-01-22 ENCOUNTER — Encounter: Payer: Self-pay | Admitting: Pulmonary Disease

## 2017-01-22 ENCOUNTER — Telehealth: Payer: Self-pay

## 2017-01-22 ENCOUNTER — Ambulatory Visit (INDEPENDENT_AMBULATORY_CARE_PROVIDER_SITE_OTHER): Payer: BLUE CROSS/BLUE SHIELD | Admitting: Pulmonary Disease

## 2017-01-22 VITALS — BP 134/78 | HR 78 | Ht 66.0 in | Wt 194.8 lb

## 2017-01-22 DIAGNOSIS — F17210 Nicotine dependence, cigarettes, uncomplicated: Secondary | ICD-10-CM

## 2017-01-22 DIAGNOSIS — R0602 Shortness of breath: Secondary | ICD-10-CM | POA: Diagnosis not present

## 2017-01-22 DIAGNOSIS — I272 Pulmonary hypertension, unspecified: Secondary | ICD-10-CM

## 2017-01-22 NOTE — Progress Notes (Signed)
Anna [name]    [id number]    [birth year]  Primary Care Physician:Howell, Bryn Gulling, MD  Referring Physician: [name], MD [address] [place], [place] [address]  Chief complaint:   Follow-up after hospitalization for pneumonia Active smoker  HPI: 56 year old with heart failure, hypertension, hyperlipidemia, depression, lupus (Plaquenil and methotrexate], CVA. She was admitted in February 2018 for pneumonia, parapneumonic effusion which was evaluated by thoracentesis. It was exudative by light's criteria [LDH 982]. She had an autoimmune workup which was not remarkable. She has been treated with antibiotics, prednisone and discharged.  She has history of lupus and was methotrexate. Follow up with Dr. Dossie Der a [place].   Interim History: Has increasing body aches consistent with worsening lupus. She saw her Rheumatologist last week and given steroids. She is also on plaquinel. Denies any dyspnea, cough, sputum production, fevers, chills.  Outpatient Encounter Prescriptions as of 01/22/2017  Medication Sig  . amLODipine (NORVASC) 10 MG tablet Take 10 mg by mouth daily.  [name] atorvastatin (LIPITOR) 20 MG tablet Take 20 mg by mouth daily.  . carbamazepine (TEGRETOL) 200 MG tablet Take 200-400 mg by mouth See admin instructions. Take 1 tablet (200 mg) by mouth every morning and 2 tablets (400 mg) at night  . diazepam (VALIUM) 10 MG tablet Take 10 mg by mouth every 6 (six) hours as needed for anxiety.  [name] escitalopram (LEXAPRO) 10 MG tablet Take 30 mg by mouth daily.  . folic acid (FOLVITE) 1 MG tablet Take 1 mg by mouth daily.  [name] levothyroxine (SYNTHROID, LEVOTHROID) 50 MCG tablet Take 50 mcg by mouth daily before breakfast.  . metoprolol succinate (TOPROL-XL) 50 MG 24 hr tablet Take 50 mg by mouth daily. Take with or immediately following a meal.  . predniSONE (DELTASONE) 10 MG tablet   . QUEtiapine (SEROQUEL) 200 MG tablet Take 200  mg by mouth at bedtime.   No facility-administered encounter medications on file as of 01/22/2017.     Allergies as of 01/22/2017  . (No Known Allergies)    Past Medical History:  Diagnosis Date  . Acute CHF ([place]) 07/2016  . Depressive disorder, not elsewhere classified   . Family history of adverse reaction to anesthesia    mother passed away during surgery  . Hypertension   . Hypothyroid    Dr. Wilson Singer  . Kidney stone   . Lupus (systemic lupus erythematosus) ([place])   . Migraine   . Pure hypercholesterolemia   . Stroke [facility])     Past Surgical History:  Procedure Laterality Date  . CESAREAN SECTION    . ECTOPIC PREGNANCY SURGERY     times 2  . LAPAROSCOPY     with rt salpingectomy  . [place] SURGERY  2003  . TONSILLECTOMY    . TOTAL ABDOMINAL HYSTERECTOMY  2004   ovaries retained, DUB  . TUBAL LIGATION  1985    Family History  Problem Relation Age of Onset  . Diabetes Mother   . Hypertension Sister   . Diabetes Maternal Grandmother   . Autoimmune disease Neg Hx     Social History   Social History  . Marital status: Married    Spouse name: Ludwig Clarks  . Number of children: 1  . Years of education: 45   Occupational History  .  Unemployed   Social History Main Topics  . Smoking status: Current Every Day Smoker    Packs/day: 0.50  Years: 35.00    Types: Cigarettes    Last attempt to quit: 08/04/2016  . Smokeless tobacco: Never Used     Comment: Continued cessation encouraged  . Alcohol use Yes     Comment: 1 drink every 3-4 months.  . Drug use: No  . Sexual activity: Yes    Partners: Male    Birth control/ protection: Surgical     Comment: TAH   Other Topics Concern  . 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   Patient has one son, lives in [place].   Patient is disabled.   Patient has a college education.   Patient is right-handed.   Patient drinks some caffeine.    Review  of systems: Review of Systems  Constitutional: Negative for fever and chills.  HENT: Negative.   Eyes: Negative for blurred vision.  Respiratory: as per HPI  Cardiovascular: Negative for chest pain and palpitations.  Gastrointestinal: Negative for vomiting, diarrhea, blood per rectum. Genitourinary: Negative for dysuria, urgency, frequency and hematuria.  Musculoskeletal: Negative for myalgias, back pain and joint pain.  Skin: Negative for itching and rash.  Neurological: Negative for dizziness, tremors, focal weakness, seizures and loss of consciousness.  Endo/Heme/Allergies: Negative for environmental allergies.  Psychiatric/Behavioral: Negative for depression, suicidal ideas and hallucinations.  All other systems reviewed and are negative.  Physical Exam: Blood pressure (!) 130/92, pulse 97, height 5' 5.5" (1.664 m), weight 195 lb 6.4 oz (88.6 kg), last menstrual period 07/07/2002, SpO2 95 %. Gen:      No acute distress HEENT:  EOMI, sclera anicteric Neck:     No masses; no thyromegaly Lungs:    Clear to auscultation bilaterally; normal respiratory effort CV:         Regular rate and rhythm; no murmurs Abd:      + bowel sounds; soft, non-tender; no palpable masses, no distension Ext:    No edema; adequate peripheral perfusion Skin:      Warm and dry; no rash Neuro: alert and oriented x 3 Psych: normal mood and affect  Data Reviewed: CT chest 08/11/16-right lower lobe consolidation with moderate right effusion, moderate emphysema Chest x-ray 09/02/16-patchy infiltrate over the right lower lobe, improvement in pleural effusion. Chest x-ray 10/23/16- clear lungs. I have reviewed all images personally  Echo 08/06/16 - Left ventricle: The cavity size was normal. Wall thickness was   increased in a pattern of mild LVH. Systolic function was normal.   The estimated ejection fraction was in the range of 60% to 65%.   Wall motion was normal; there were no regional wall motion    abnormalities. Features are consistent with a pseudonormal left   ventricular filling pattern, with concomitant abnormal relaxation   and increased filling pressure (grade 2 diastolic dysfunction). - Mitral valve: There was mild regurgitation. Valve area by   pressure half-time: 1.72 cm^2. - Left atrium: The atrium was moderately dilated. - Pulmonary arteries: Systolic pressure was mildly increased. PA   peak pressure: 39 mm Hg (S). - Pericardium, extracardiac: A trivial pericardial effusion was   identified.  Serologies 08/13/16 Double-stranded DNA-55, C3-139, C4-5 SCL 70 negative  Assessment:  Eval for COPD No need for inhalers at present. We will schedule for PFTs and revaluate  History of lupus I am not sure the what medications she is currently on. Will get records from her rheumatologist.   Pulmonary hypertension Mildly elevated systolic pressure noted at  last admission. Will continue to follow this  Smoker Continue to smoke. Smoking cessation encouraged. Time spent counseling- 5 mins  Admission for pneumonia, parapneumonic effusion feb 2018 Follow up  x-ray shows complete resolution of the right lower lobe opacity and pleural effusion.  Plan/Recommendations: - PFTs - Smoking cessation  Marshell Garfinkel MD [facility] Pager [phone] 01/22/2017, 3:38 PM  CC: [name], MD

## 2017-01-22 NOTE — Telephone Encounter (Signed)
Lm with Lattie Haw at [facility] requesting last two OV notes to be faxed to our office. Will leave encounter open to f/u on.

## 2017-01-22 NOTE — Patient Instructions (Signed)
We will schedule you for pulmonary function tests. Return to clinic in 3-6 months.

## 2017-02-05 NOTE — Telephone Encounter (Signed)
Records have been received and given to PM for review. Nothing further needed.

## 2017-02-20 ENCOUNTER — Ambulatory Visit: Payer: BLUE CROSS/BLUE SHIELD | Admitting: Nurse Practitioner

## 2017-03-11 NOTE — Progress Notes (Signed)
[facility]  PATIENT: Anna [name] DOB: Dec 25, 1960   REASON FOR VISIT: Follow-up for migraines HISTORY FROM: Patient    HISTORY OF PRESENT ILLNESS: UPDATE 09/06/2018CM Anna [name], 56 year old female returns for follow-up. Migraines which are in excellent control. Patient had an admission for acute encephalopathy in January 2018 she also has a significant history of lupus hypertension depression and chronic CHF. Her admission diagnosis was management of acute encephalopathy with hypoxemia acute anemia and acute kidney injury. We  had her on gabapentin for her headaches and Robaxin however these were discontinued while she was hospitalized. She also has a history of bipolar disorder and see psychiatry. She is on prednisone for her lupus which has helped her headaches tremendously. She returns for reevaluation   02/21/16 CM Anna [name], 56 year old female returns for follow-up. She was last seen in the office by Dr. Leta Baptist 06/21/2014. She has a long history of migraines and is currently taking gabapentin and Robaxin. She has lupus.Migraines are in fairly good control.  Blood pressure in the office today 150/96.  She has just had  changes to her blood pressure medicine. She claims "this is better than it has been." She continues to have diffuse arthritic pain particularly in the knees and ankles. 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 She continues to see psychiatry for her bipolar disorder. She returns for reevaluation   UPDATE 06/19/14: VPSince last visit, has had more migraines: 2-3 per week, throbbing, with nausea, photo/phonophobia. She uses robaxin and goody's back/body powder for relief. Still with diffuse arthritis pain. Feels like her lupus is flaring up. Sees Dr. Trudie Reed for lupus. Sees Dr. Lavone Neri and Dr. Tomi Bamberger for PCP.   UPDATE 06/17/13 (CM): History of migraines and has been on gabapentin and Robaxin in the past both  that were beneficial however she is no longer taking this medications. Her headaches seem to be in fairly good control. Her biggest complaint is constipation for which she takes Colace. Her hypertension is under better control with the change in her blood pressure medicines. She also has history of lupus, bipolar disorder and hyperlipidemia. She is stable from a neurologic standpoint.  UPDATE 12/09/12 (CM): Recent admission to the ER for dizziness and lightheadedness. Blood pressure noted to be extremely elevated. Her Benicar was increased by Dr. Tomi Bamberger. No recent headaches. 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 Has not followed up with Dr. Katy Fitch she was given bifocal glasses but she does not wear them. She is trying to get disability. She has Lupus. She recently was diagnosed with asthma. She sees Dr. Candis Schatz for bipolar disorder.She has been referred to vestibular rehab. She is driving.   PRIOR HPI (Dr. Erling Cruz): Long history of headaches, dizziness, and visual disturbance. MRI study of the brain 12/07/2003 and intracranial MRA 12/11/2003 were normal. She had a history of dizziness that sounded as if it was true vertigo. She had double vision on red lens testing and was seen by Dr. Jolyn Nap at [facility] and Dr. Philis Kendall in [place] with evidence of convergent spasm on examination. TSH was low at 0.012, sedimentation rate was 4, CPK 88, serum for acetylcholine receptor antibodies was normal, and hemoglobin A1c was 5.6. Dr Erling Cruz saw her again 04/29/2007 for double vision. She was seen by Dr. Knute Neu and diagnosed with systemic lupus erythematosus in 2008. She was tried on atropine eye drops for  her accommodative convergence spasm. She was seen again 08/12/2007 given a trial of clonazepam for convergence spasm. Her double vision is side-by-side and intermittent. She complains of intermittent lightheaded or dizziness worse with  standing up or when walking lasting seconds to a few minutes. She has noted headaches that are vague in location. She states she has a lot of stress in her life. Her MRI of the brain in December 2010 was normal.      REVIEW OF SYSTEMS: Full 14 system review of systems performed and notable only for those listed, all others are neg:  Constitutional: neg  Cardiovascular: neg Ear/Nose/Throat: neg  Skin: neg Eyes: blurred vision Respiratory: neg Gastroitestinal: neg  Hematology/Lymphatic: neg  Endocrine: neg Musculoskeletal: Joint pain in the knees and ankles Allergy/Immunology: neg Neurological: Occasional migraine Psychiatric: depression and anxiety followed by psychiatry Sleep : neg   ALLERGIES: No Known Allergies  HOME MEDICATIONS: Outpatient Medications Prior to Visit  Medication Sig Dispense Refill  . amLODipine (NORVASC) 10 MG tablet Take 10 mg by mouth daily.    [name] atorvastatin (LIPITOR) 20 MG tablet Take 20 mg by mouth daily.    . carbamazepine (TEGRETOL) 200 MG tablet Take 200-400 mg by mouth See admin instructions. Take 1 tablet (200 mg) by mouth every morning and 2 tablets (400 mg) at night    . diazepam (VALIUM) 10 MG tablet Take 10 mg by mouth every 6 (six) hours as needed for anxiety.    [name] escitalopram (LEXAPRO) 10 MG tablet Take 30 mg by mouth daily.    . folic acid (FOLVITE) 1 MG tablet Take 1 mg by mouth daily.    [name] levothyroxine (SYNTHROID, LEVOTHROID) 50 MCG tablet Take 50 mcg by mouth daily before breakfast.    . metoprolol succinate (TOPROL-XL) 50 MG 24 hr tablet Take 50 mg by mouth daily. Take with or immediately following a meal.    . predniSONE (DELTASONE) 10 MG tablet   2  . QUEtiapine (SEROQUEL) 200 MG tablet Take 200 mg by mouth at bedtime.     No facility-administered medications prior to visit.     PAST MEDICAL HISTORY: Past Medical History:  Diagnosis Date  . Acute CHF ([place]) 07/2016  . Depressive disorder, not elsewhere classified   . Family  history of adverse reaction to anesthesia    mother passed away during surgery  . Hypertension   . Hypothyroid    Dr. Wilson Singer  . Kidney stone   . Lupus (systemic lupus erythematosus) ([place])   . Migraine   . Pure hypercholesterolemia   . Stroke [facility])     PAST SURGICAL HISTORY: Past Surgical History:  Procedure Laterality Date  . CESAREAN SECTION    . ECTOPIC PREGNANCY SURGERY     times 2  . LAPAROSCOPY     with rt salpingectomy  . [place] SURGERY  2003  . TONSILLECTOMY    . TOTAL ABDOMINAL HYSTERECTOMY  2004   ovaries retained, DUB  . TUBAL LIGATION  1985    FAMILY HISTORY: Family History  Problem Relation Age of Onset  . Diabetes Mother   . Hypertension Sister   . Diabetes Maternal Grandmother   . Autoimmune disease Neg Hx     SOCIAL HISTORY: Social History   Social History  . Marital status: Married    Spouse name: Ludwig Clarks  . Number of children: 1  . Years of education: 89   Occupational History  .  Unemployed   Social History Main Topics  . Smoking  status: Current Every Day Smoker    Packs/day: 0.50    Years: 35.00    Types: Cigarettes    Last attempt to quit: 08/04/2016  . Smokeless tobacco: Never Used     Comment: Continued cessation encouraged  . Alcohol use Yes     Comment: 1 drink every 3-4 months.  . Drug use: No  . Sexual activity: Yes    Partners: Male    Birth control/ protection: Surgical     Comment: TAH   Other Topics Concern  . 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   Patient has one son, lives in [place].   Patient is disabled.   Patient has a college education.   Patient is right-handed.   Patient drinks some caffeine.     PHYSICAL EXAM  Vitals:   03/12/17 0948  BP: 123/82  Pulse: 79  Weight: 193 lb 3.2 oz (87.6 kg)   Body mass index is 31.18 kg/m. Generalized: Well developed, in no acute distress  Head: normocephalic and atraumatic,. Oropharynx  benign  Neck: Supple,  Musculoskeletal no abnormalities Neurological examination   Mentation: Alert oriented to time, place, history taking. Follows all commands speech and language fluent  Cranial nerve II-XII: Pupils were equal round reactive to light extraocular movements with intermittent convergence spasm and subjective double vision no nystagmus  visual field were full on confrontational test. Facial sensation and strength were normal. hearing was intact to finger rubbing bilaterally. Uvula tongue midline. head turning and shoulder shrug were normal and symmetric.Tongue protrusion into cheek strength was normal.  Motor: normal bulk and tone, full strength in the BUE, BLE,  Coordination: finger-nose-finger, heel-to-shin bilaterally, no dysmetria, movements are slow Reflexes: 1+ upper lower and symmetric Gait and Station: Antalgic slow steady narrow based gait  No assistive device  DIAGNOSTIC DATA (LABS, IMAGING, TESTING) - I reviewed patient records, labs, notes, testing and imaging myself where available.  Lab Results  Component Value Date   WBC 9.8 08/16/2016   HGB 9.0 (L) 08/16/2016   HCT 27.1 (L) 08/16/2016   MCV 91.9 08/16/2016   PLT 525 (H) 08/16/2016      Component Value Date/Time   NA 137 08/16/2016 0526   K 3.9 08/16/2016 0526   CL 101 08/16/2016 0526   CO2 23 08/16/2016 0526   GLUCOSE 97 08/16/2016 0526   BUN 26 (H) 08/16/2016 0526   CREATININE 1.37 (H) 08/16/2016 0526   CREATININE 1.17 (H) 01/10/2013 1234   CALCIUM 8.6 (L) 08/16/2016 0526   PROT 6.1 (L) 08/14/2016 0318   ALBUMIN 2.1 (L) 08/14/2016 0318   AST 33 08/14/2016 0318   ALT 46 08/14/2016 0318   ALKPHOS 69 08/14/2016 0318   BILITOT 0.7 08/14/2016 0318   GFRNONAA 42 (L) 08/16/2016 0526   GFRAA 49 (L) 08/16/2016 0526       ASSESSMENT AND PLAN  56 y.o. year old female  has a past medical history of Hypertension;  Lupus (systemic lupus erythematosus) ([place]); Depressive disorder, not elsewhere  classified;  Migraine; and bipolar disorder here to follow-up.Recent hospital admission for acute encephalopathy with hypoxemia acute anemia and acute kidney injury and CHF. She was taken off of her gabapentin and Robaxin at that time. Her headaches are doing well on prednisone 10 mg daily to treat her lupus. The patient is a current patient of Dr. Leta Baptist  who is out of the office this am.  This note  is sent to the work in doctor.     PLAN:Headaches in good control no RX from our clinic follow up with primary care provider Follow up here prn only for change in headaches I spent 25 min  in total face to face time with the patient more than 50% of which was spent counseling and coordination of care, reviewing test results reviewing medications and discussing and reviewing the diagnosis of migraine and recent hospitalization records Dennie Bible, [facility], [facility], [facility] [address] [place], [place] [address] [phone]

## 2017-03-12 ENCOUNTER — Ambulatory Visit (INDEPENDENT_AMBULATORY_CARE_PROVIDER_SITE_OTHER): Payer: BLUE CROSS/BLUE SHIELD | Admitting: Nurse Practitioner

## 2017-03-12 ENCOUNTER — Encounter: Payer: Self-pay | Admitting: Nurse Practitioner

## 2017-03-12 VITALS — BP 123/82 | HR 79 | Wt 193.2 lb

## 2017-03-12 DIAGNOSIS — G43009 Migraine without aura, not intractable, without status migrainosus: Secondary | ICD-10-CM

## 2017-03-12 NOTE — Patient Instructions (Signed)
Blood pressure 123/82continue meds Gabapentin stopped due to stage 3 kidney disease follow up with primary care provider Follow up here prn only

## 2017-04-22 ENCOUNTER — Other Ambulatory Visit: Payer: Self-pay | Admitting: Nephrology

## 2017-04-22 DIAGNOSIS — N183 Chronic kidney disease, stage 3 unspecified: Secondary | ICD-10-CM

## 2017-04-29 ENCOUNTER — Encounter: Payer: Self-pay | Admitting: Pulmonary Disease

## 2017-04-29 ENCOUNTER — Ambulatory Visit (INDEPENDENT_AMBULATORY_CARE_PROVIDER_SITE_OTHER): Payer: BLUE CROSS/BLUE SHIELD | Admitting: Pulmonary Disease

## 2017-04-29 ENCOUNTER — Telehealth: Payer: Self-pay

## 2017-04-29 VITALS — BP 128/80 | HR 98 | Ht 66.0 in | Wt 191.0 lb

## 2017-04-29 DIAGNOSIS — R0602 Shortness of breath: Secondary | ICD-10-CM | POA: Diagnosis not present

## 2017-04-29 DIAGNOSIS — I272 Pulmonary hypertension, unspecified: Secondary | ICD-10-CM | POA: Diagnosis not present

## 2017-04-29 LAB — PULMONARY FUNCTION TEST
DL/VA % PRED: 48 %
DL/VA: 2.45 ml/min/mmHg/L
DLCO COR % PRED: 38 %
DLCO COR: 10.34 ml/min/mmHg
DLCO UNC % PRED: 38 %
DLCO unc: 10.4 ml/min/mmHg
FEF 25-75 POST: 1.94 L/s
FEF 25-75 PRE: 1.74 L/s
FEF2575-%CHANGE-POST: 11 %
FEF2575-%PRED-POST: 84 %
FEF2575-%PRED-PRE: 75 %
FEV1-%CHANGE-POST: 2 %
FEV1-%Pred-Post: 99 %
FEV1-%Pred-Pre: 96 %
FEV1-Post: 2.32 L
FEV1-Pre: 2.26 L
FEV1FVC-%Change-Post: 1 %
FEV1FVC-%Pred-Pre: 93 %
FEV6-%CHANGE-POST: 3 %
FEV6-%Pred-Post: 107 %
FEV6-%Pred-Pre: 104 %
FEV6-PRE: 2.97 L
FEV6-Post: 3.06 L
FEV6FVC-%Change-Post: 0 %
FEV6FVC-%PRED-PRE: 101 %
FEV6FVC-%Pred-Post: 102 %
FVC-%Change-Post: 1 %
FVC-%Pred-Post: 104 %
FVC-%Pred-Pre: 102 %
FVC-Post: 3.07 L
FVC-Pre: 3.02 L
Post FEV1/FVC ratio: 76 %
Post FEV6/FVC ratio: 100 %
Pre FEV1/FVC ratio: 75 %
Pre FEV6/FVC Ratio: 99 %
RV % PRED: 85 %
RV: 1.74 L
TLC % pred: 93 %
TLC: 5.01 L

## 2017-04-29 NOTE — Patient Instructions (Signed)
PFT done today. 

## 2017-04-29 NOTE — Patient Instructions (Signed)
I have reviewed her pulmonary function tests.  They show some changes consistent with effects of smoking You need to work on quitting smoking. We will schedule you for an echocardiogram to check out your heart.  This will be done in January. Follow-up in clinic after that.

## 2017-04-29 NOTE — Telephone Encounter (Signed)
Record release has been faxed to [facility] requesting records. Received successful confirmation. Will await records.

## 2017-04-29 NOTE — Progress Notes (Signed)
Anna [name]    [id number]    [birth year]  Primary Care Physician:Howell, Bryn Gulling, MD  Referring Physician: [name], MD [address] [place], [place] [address]  Chief complaint:   Follow-up after hospitalization for pneumonia Active smoker  HPI: 56 year old with heart failure, hypertension, hyperlipidemia, depression, lupus (Plaquenil and methotrexate], CVA. She was admitted in February 2018 for pneumonia, parapneumonic effusion which was evaluated by thoracentesis. It was exudative by light's criteria [LDH 982]. She has been treated with antibiotics, prednisone and discharged.  She has history of lupus and was on methotrexate. Follow up with Dr. Dossie Der a [place].   Interim History: Has worsening lupus symptoms.  She follows at [facility].  She was recently started on Imuran and is on Plaquenil and prednisone at 5 mg.  Lung symptoms are stable.  She denies any dyspnea, cough, sputum production She continues to smoke.  Outpatient Encounter Prescriptions as of 04/29/2017  Medication Sig  . amLODipine (NORVASC) 10 MG tablet Take 10 mg by mouth daily.  [name] atorvastatin (LIPITOR) 20 MG tablet Take 20 mg by mouth daily.  [name] azaTHIOprine (IMURAN) 50 MG tablet 50 mg daily.  . carbamazepine (TEGRETOL) 200 MG tablet Take 200-400 mg by mouth See admin instructions. Take 1 tablet (200 mg) by mouth every morning and 2 tablets (400 mg) at night  . diazepam (VALIUM) 10 MG tablet Take 10 mg by mouth every 6 (six) hours as needed for anxiety.  [name] escitalopram (LEXAPRO) 10 MG tablet Take 30 mg by mouth daily.  . folic acid (FOLVITE) 1 MG tablet Take 1 mg by mouth daily.  . furosemide (LASIX) 20 MG tablet 20 mg 2 (two) times daily.  . hydroxychloroquine (PLAQUENIL) 200 MG tablet 200 mg daily.  [name] levothyroxine (SYNTHROID, LEVOTHROID) 50 MCG tablet Take 50 mcg by mouth daily before breakfast.  . losartan (COZAAR) 100 MG tablet  100 mg daily.  . metoprolol succinate (TOPROL-XL) 50 MG 24 hr tablet Take 50 mg by mouth daily. Take with or immediately following a meal.  . predniSONE (DELTASONE) 10 MG tablet 5 mg.   [name] QUEtiapine (SEROQUEL) 200 MG tablet Take 200 mg by mouth at bedtime.   No facility-administered encounter medications on file as of 04/29/2017.     Allergies as of 04/29/2017  . (No Known Allergies)    Past Medical History:  Diagnosis Date  . Acute CHF ([place]) 07/2016  . Depressive disorder, not elsewhere classified   . Family history of adverse reaction to anesthesia    mother passed away during surgery  . Hypertension   . Hypothyroid    Dr. Wilson Singer  . Kidney stone   . Lupus (systemic lupus erythematosus) ([place])   . Migraine   . Pure hypercholesterolemia   . Stroke [facility])     Past Surgical History:  Procedure Laterality Date  . CESAREAN SECTION    . ECTOPIC PREGNANCY SURGERY     times 2  . LAPAROSCOPY     with rt salpingectomy  . [place] SURGERY  2003  . TONSILLECTOMY    . TOTAL ABDOMINAL HYSTERECTOMY  2004   ovaries retained, DUB  . TUBAL LIGATION  1985    Family History  Problem Relation Age of Onset  . Diabetes Mother   . Hypertension Sister   . Diabetes Maternal Grandmother   . Autoimmune disease Neg Hx     Social History   Social History  .  Marital status: Married    Spouse name: Ludwig Clarks  . Number of children: 1  . Years of education: 3   Occupational History  .  Unemployed   Social History Main Topics  . Smoking status: Current Every Day Smoker    Packs/day: 0.50    Years: 35.00    Types: Cigarettes    Last attempt to quit: 08/04/2016  . Smokeless tobacco: Never Used     Comment: Continued cessation encouraged  . Alcohol use Yes     Comment: 1 drink every 3-4 months.  . Drug use: No  . Sexual activity: Yes    Partners: Male    Birth control/ protection: Surgical     Comment: TAH   Other Topics Concern  . 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   Patient has one son, lives in [place].   Patient is disabled.   Patient has a college education.   Patient is right-handed.   Patient drinks some caffeine.   Review of systems: Review of Systems  Constitutional: Negative for fever and chills.  HENT: Negative.   Eyes: Negative for blurred vision.  Respiratory: as per HPI  Cardiovascular: Negative for chest pain and palpitations.  Gastrointestinal: Negative for vomiting, diarrhea, blood per rectum. Genitourinary: Negative for dysuria, urgency, frequency and hematuria.  Musculoskeletal: Negative for myalgias, back pain and joint pain.  Skin: Negative for itching and rash.  Neurological: Negative for dizziness, tremors, focal weakness, seizures and loss of consciousness.  Endo/Heme/Allergies: Negative for environmental allergies.  Psychiatric/Behavioral: Negative for depression, suicidal ideas and hallucinations.  All other systems reviewed and are negative.  Physical Exam: Blood pressure 128/80, pulse 98, height 5\' 6"  (1.676 m), weight 86.6 kg (191 lb), last menstrual period 07/07/2002, SpO2 92 %. Gen:      No acute distress HEENT:  EOMI, sclera anicteric Neck:     No masses; no thyromegaly Lungs:    Clear to auscultation bilaterally; normal respiratory effort CV:         Regular rate and rhythm; no murmurs Abd:      + bowel sounds; soft, non-tender; no palpable masses, no distension Ext:    No edema; adequate peripheral perfusion Skin:      Warm and dry; no rash Neuro: alert and oriented x 3 Psych: normal mood and affect  Data Reviewed: CT chest 08/11/16-right lower lobe consolidation with moderate right effusion, moderate emphysema Chest x-ray 09/02/16-patchy infiltrate over the right lower lobe, improvement in pleural effusion. Chest x-ray 10/23/16- clear lungs. I have reviewed all images personally  Echo 08/06/16 - Left ventricle: The cavity size was normal.  Wall thickness was   increased in a pattern of mild LVH. Systolic function was normal.   The estimated ejection fraction was in the range of 60% to 65%.   Wall motion was normal; there were no regional wall motion   abnormalities. Features are consistent with a pseudonormal left   ventricular filling pattern, with concomitant abnormal relaxation   and increased filling pressure (grade 2 diastolic dysfunction). - Mitral valve: There was mild regurgitation. Valve area by   pressure half-time: 1.72 cm^2. - Left atrium: The atrium was moderately dilated. - Pulmonary arteries: Systolic pressure was mildly increased. PA   peak pressure: 39 mm Hg (S). - Pericardium, extracardiac: A trivial pericardial effusion was   identified.  Serologies 08/13/16 Double-stranded DNA-55, C3-139, C4-5 SCL 70 negative  PFTs 04/29/17  FVC 3.07 (104%], FEV1 2.32 [99%], F/F 76, TLC 93%, DLCO 38% Minimal obstruction, severe diffusion defect.  Assessment:  Emphysema Even though she has significant emphysematous changes on CT scan her PFTs do not show any overt obstruction by F/F criteria No need for inhalers at present.   Lupus Manifest primarily as arthritis. There is no evidence of lung involvement with no interstitial lung disease. She continues on Imuran, prednisone and Plaquenil per rheumatology.  Pulmonary hypertension Mildly elevated systolic pressure noted at last admission. PFTs shows severe reduction in diffusion capacity. Will need repeat echo to make sure the PA pressure is not increased.   Smoker Continue to smoke. Smoking cessation encouraged. Time spent counseling- 5 mins  Plan/Recommendations: - Echo - Smoking cessation  Marshell Garfinkel MD [facility] Pager [phone] 04/29/2017, 1:31 PM  CC: [name], MD

## 2017-05-04 ENCOUNTER — Other Ambulatory Visit: Payer: BLUE CROSS/BLUE SHIELD

## 2017-05-06 NOTE — Telephone Encounter (Signed)
Records have been received and placed in PM cubby for review.  Nothing further needed.

## 2017-05-15 ENCOUNTER — Ambulatory Visit
Admission: RE | Admit: 2017-05-15 | Discharge: 2017-05-15 | Disposition: A | Discharge status: Home / Self Care | Payer: BLUE CROSS/BLUE SHIELD | Source: Ambulatory Visit | Attending: Nephrology | Admitting: Nephrology

## 2017-05-15 DIAGNOSIS — N183 Chronic kidney disease, stage 3 unspecified: Secondary | ICD-10-CM

## 2017-07-13 ENCOUNTER — Other Ambulatory Visit: Payer: Self-pay

## 2017-07-13 ENCOUNTER — Other Ambulatory Visit (HOSPITAL_COMMUNITY): Payer: BLUE CROSS/BLUE SHIELD

## 2017-07-13 ENCOUNTER — Ambulatory Visit (HOSPITAL_COMMUNITY): Payer: BLUE CROSS/BLUE SHIELD | Attending: Cardiology

## 2017-07-13 DIAGNOSIS — I272 Pulmonary hypertension, unspecified: Secondary | ICD-10-CM

## 2017-07-20 ENCOUNTER — Encounter: Payer: Self-pay | Admitting: Pulmonary Disease

## 2017-07-20 ENCOUNTER — Ambulatory Visit: Payer: BLUE CROSS/BLUE SHIELD | Admitting: Pulmonary Disease

## 2017-07-20 VITALS — BP 140/92 | HR 84 | Ht 66.0 in | Wt 186.4 lb

## 2017-07-20 DIAGNOSIS — J189 Pneumonia, unspecified organism: Secondary | ICD-10-CM | POA: Diagnosis not present

## 2017-07-20 DIAGNOSIS — Z09 Encounter for follow-up examination after completed treatment for conditions other than malignant neoplasm: Secondary | ICD-10-CM | POA: Diagnosis not present

## 2017-07-20 NOTE — Progress Notes (Signed)
Anna [name]    [id number]    11-19-1960  Primary Care Physician:Howell, Bryn Gulling, MD  Referring Physician: [name], MD [address] [name], [name] [address]  Chief complaint:   Follow-up after hospitalization for pneumonia Active smoker  HPI: 57 year old with heart failure, hypertension, hyperlipidemia, depression, lupus (Plaquenil and methotrexate], CVA. She was admitted in February 2018 for pneumonia, parapneumonic effusion which was evaluated by thoracentesis. It was exudative by light's criteria [LDH 982]. She has been treated with antibiotics, prednisone and discharged.  She has history of lupus and was on methotrexate in psat. In 2018 she had worsening lupus symptoms and was started on Imuran and is on Plaquenil and prednisone at 5 mg.  She follows at [facility].    Interim History: Lung symptoms are stable.  She denies any dyspnea, cough, sputum production.  She is not on any inhalers at present Continues to smoke but is trying to cut down.  Outpatient Encounter Medications as of 07/20/2017  Medication Sig  . amLODipine (NORVASC) 10 MG tablet Take 10 mg by mouth daily.  [name] atorvastatin (LIPITOR) 20 MG tablet Take 20 mg by mouth daily.  [name] azaTHIOprine (IMURAN) 50 MG tablet 50 mg daily.  . carbamazepine (TEGRETOL) 200 MG tablet Take 200-400 mg by mouth See admin instructions. Take 1 tablet (200 mg) by mouth every morning and 2 tablets (400 mg) at night  . diazepam (VALIUM) 10 MG tablet Take 10 mg by mouth every 6 (six) hours as needed for anxiety.  [name] escitalopram (LEXAPRO) 10 MG tablet Take 30 mg by mouth daily.  . folic acid (FOLVITE) 1 MG tablet Take 1 mg by mouth daily.  . furosemide (LASIX) 20 MG tablet 20 mg 2 (two) times daily.  . hydroxychloroquine (PLAQUENIL) 200 MG tablet 200 mg daily.  [name] levothyroxine (SYNTHROID, LEVOTHROID) 50 MCG tablet Take 50 mcg by mouth daily before breakfast.  . losartan (COZAAR)  100 MG tablet 100 mg daily.  . metoprolol succinate (TOPROL-XL) 50 MG 24 hr tablet Take 50 mg by mouth daily. Take with or immediately following a meal.  . predniSONE (DELTASONE) 10 MG tablet 5 mg.   [name] QUEtiapine (SEROQUEL) 200 MG tablet Take 200 mg by mouth at bedtime.   No facility-administered encounter medications on file as of 07/20/2017.     Allergies as of 07/20/2017  . (No Known Allergies)    Past Medical History:  Diagnosis Date  . Acute CHF ([place]) 07/2016  . Depressive disorder, not elsewhere classified   . Family history of adverse reaction to anesthesia    mother passed away during surgery  . Hypertension   . Hypothyroid    Dr. Wilson Singer  . Kidney stone   . Lupus (systemic lupus erythematosus) ([place])   . Migraine   . Pure hypercholesterolemia   . Stroke [facility])     Past Surgical History:  Procedure Laterality Date  . CESAREAN SECTION    . ECTOPIC PREGNANCY SURGERY     times 2  . LAPAROSCOPY     with rt salpingectomy  . [place] SURGERY  2003  . TONSILLECTOMY    . TOTAL ABDOMINAL HYSTERECTOMY  2004   ovaries retained, DUB  . TUBAL LIGATION  1985    Family History  Problem Relation Age of Onset  . Diabetes Mother   . Hypertension Sister   . Diabetes Maternal Grandmother   . Autoimmune disease Neg Hx  Social History   Socioeconomic History  . Marital status: Married    Spouse name: Ludwig Clarks  . Number of children: 1  . Years of education: 43  . Highest education level: Not on file  Social Needs  . Financial resource strain: Not on file  . Food insecurity - worry: Not on file  . Food insecurity - inability: Not on file  . Transportation needs - medical: Not on file  . Transportation needs - non-medical: Not on file  Occupational History    Employer: UNEMPLOYED  Tobacco Use  . Smoking status: Current Every Day Smoker    Packs/day: 0.50    Years: 35.00    Pack years: 17.50    Types: Cigarettes    Last attempt to quit: 08/04/2016    Years since  quitting: 0.9  . Smokeless tobacco: Never Used  . Tobacco comment: Continued cessation encouraged  Substance and Sexual Activity  . Alcohol use: Yes    Comment: 1 drink every 3-4 months.  . Drug use: No  . Sexual activity: Yes    Partners: Male    Birth control/protection: Surgical    Comment: TAH  Other Topics Concern  . 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   Patient has one son, lives in [place].   Patient is disabled.   Patient has a college education.   Patient is right-handed.   Patient drinks some caffeine.   Review of systems: Review of Systems  Constitutional: Negative for fever and chills.  HENT: Negative.   Eyes: Negative for blurred vision.  Respiratory: as per HPI  Cardiovascular: Negative for chest pain and palpitations.  Gastrointestinal: Negative for vomiting, diarrhea, blood per rectum. Genitourinary: Negative for dysuria, urgency, frequency and hematuria.  Musculoskeletal: Negative for myalgias, back pain and joint pain.  Skin: Negative for itching and rash.  Neurological: Negative for dizziness, tremors, focal weakness, seizures and loss of consciousness.  Endo/Heme/Allergies: Negative for environmental allergies.  Psychiatric/Behavioral: Negative for depression, suicidal ideas and hallucinations.  All other systems reviewed and are negative.  Physical Exam: Blood pressure (!) 140/92, pulse 84, height 5\' 6"  (1.676 m), weight 186 lb 6.4 oz (84.6 kg), last menstrual period 07/07/2002, SpO2 99 %. Gen:      No acute distress HEENT:  EOMI, sclera anicteric Neck:     No masses; no thyromegaly Lungs:    Clear to auscultation bilaterally; normal respiratory effort CV:         Regular rate and rhythm; no murmurs Abd:      + bowel sounds; soft, non-tender; no palpable masses, no distension Ext:    No edema; adequate peripheral perfusion Skin:      Warm and dry; no rash Neuro: alert and oriented  x 3 Psych: normal mood and affect  Data Reviewed: CT chest 08/11/16-right lower lobe consolidation with moderate right effusion, moderate emphysema Chest x-ray 09/02/16-patchy infiltrate over the right lower lobe, improvement in pleural effusion. Chest x-ray 10/23/16- clear lungs. I have reviewed all images personally  Echo 08/06/16 Mild LVH, LVEF 73-41%, grade 2 diastolic dysfunction, PA peak pressure 39 Trivial pericardial effusion.  Echo 07/13/17 Mild LVH, LVEF 93-79%, PA systolic pressure within normal range.  Serologies 08/13/16 Double-stranded DNA-55, C3-139, C4-5 SCL 70 negative  PFTs 04/29/17 FVC 3.07 (104%], FEV1 2.32 [99%], F/F 76, TLC 93%, DLCO 38% Minimal obstruction, severe diffusion defect.  Assessment:  Emphysema Even though she has significant emphysematous changes  on CT scan her PFTs do not show any overt obstruction by F/F criteria No need for inhalers at present.  She is asymptomatic from a lung standpoint.  She can follow-up in the pulmonary clinic as needed.  Lupus Manifest primarily as arthritis. There is no evidence of lung involvement with no interstitial lung disease. She continues on Imuran, prednisone and Plaquenil per rheumatology.  Pulmonary hypertension Mildly elevated systolic pressure noted at last admission. Repeat echo shows normal PA pressure  Smoker Continue to smoke. Smoking cessation encouraged. Time spent counseling- 5 mins.  Plan/Recommendations: - Smoking cessation Follow up as needed  Marshell Garfinkel MD [place] [facility] Pager [phone] 07/20/2017, 11:45 AM  CC: [name], MD

## 2017-07-20 NOTE — Patient Instructions (Signed)
I am glad that your breathing is doing well.  Continue to work on smoking cessation You can follow-up with the clinic as needed.  Please give [place] a call if you need to be seen again.

## 2018-02-02 ENCOUNTER — Emergency Department (HOSPITAL_COMMUNITY): Payer: BLUE CROSS/BLUE SHIELD

## 2018-02-02 ENCOUNTER — Emergency Department (HOSPITAL_COMMUNITY)
Admission: EM | Admit: 2018-02-02 | Discharge: 2018-02-03 | Disposition: A | Discharge status: Home / Self Care | Payer: BLUE CROSS/BLUE SHIELD | Attending: Emergency Medicine | Admitting: Emergency Medicine

## 2018-02-02 ENCOUNTER — Encounter (HOSPITAL_COMMUNITY): Payer: Self-pay

## 2018-02-02 DIAGNOSIS — I5031 Acute diastolic (congestive) heart failure: Secondary | ICD-10-CM | POA: Diagnosis not present

## 2018-02-02 DIAGNOSIS — F17210 Nicotine dependence, cigarettes, uncomplicated: Secondary | ICD-10-CM | POA: Insufficient documentation

## 2018-02-02 DIAGNOSIS — R112 Nausea with vomiting, unspecified: Secondary | ICD-10-CM | POA: Diagnosis not present

## 2018-02-02 DIAGNOSIS — Z79899 Other long term (current) drug therapy: Secondary | ICD-10-CM | POA: Insufficient documentation

## 2018-02-02 DIAGNOSIS — E039 Hypothyroidism, unspecified: Secondary | ICD-10-CM | POA: Diagnosis not present

## 2018-02-02 DIAGNOSIS — K5900 Constipation, unspecified: Secondary | ICD-10-CM | POA: Diagnosis not present

## 2018-02-02 DIAGNOSIS — I110 Hypertensive heart disease with heart failure: Secondary | ICD-10-CM | POA: Insufficient documentation

## 2018-02-02 LAB — COMPREHENSIVE METABOLIC PANEL
ALT: 12 U/L (ref 0–44)
ANION GAP: 11 (ref 5–15)
AST: 17 U/L (ref 15–41)
Albumin: 3.7 g/dL (ref 3.5–5.0)
Alkaline Phosphatase: 80 U/L (ref 38–126)
BILIRUBIN TOTAL: 0.4 mg/dL (ref 0.3–1.2)
BUN: 13 mg/dL (ref 6–20)
CO2: 21 mmol/L — ABNORMAL LOW (ref 22–32)
Calcium: 9 mg/dL (ref 8.9–10.3)
Chloride: 108 mmol/L (ref 98–111)
Creatinine, Ser: 1.65 mg/dL — ABNORMAL HIGH (ref 0.44–1.00)
GFR, EST AFRICAN AMERICAN: 39 mL/min — AB (ref >60)
GFR, EST NON AFRICAN AMERICAN: 33 mL/min — AB (ref >60)
Glucose, Bld: 151 mg/dL — ABNORMAL HIGH (ref 70–99)
POTASSIUM: 4.4 mmol/L (ref 3.5–5.1)
Sodium: 140 mmol/L (ref 135–145)
TOTAL PROTEIN: 7.1 g/dL (ref 6.5–8.1)

## 2018-02-02 LAB — I-STAT TROPONIN, ED: Troponin i, poc: 0 ng/mL (ref 0.00–0.08)

## 2018-02-02 LAB — I-STAT BETA HCG BLOOD, ED (MC, WL, AP ONLY): HCG, QUANTITATIVE: 11.6 m[IU]/mL — AB (ref <5)

## 2018-02-02 LAB — CBC
HEMATOCRIT: 32 % — AB (ref 36.0–46.0)
HEMOGLOBIN: 11.4 g/dL — AB (ref 12.0–15.0)
MCH: 42.4 pg — ABNORMAL HIGH (ref 26.0–34.0)
MCHC: 35.6 g/dL (ref 30.0–36.0)
MCV: 119 fL — ABNORMAL HIGH (ref 78.0–100.0)
Platelets: 411 10*3/uL — ABNORMAL HIGH (ref 150–400)
RBC: 2.69 MIL/uL — ABNORMAL LOW (ref 3.87–5.11)
RDW: 17.5 % — AB (ref 11.5–15.5)
WBC: 6.5 10*3/uL (ref 4.0–10.5)

## 2018-02-02 LAB — LIPASE, BLOOD: Lipase: 34 U/L (ref 11–51)

## 2018-02-02 MED ORDER — IOHEXOL 300 MG/ML  SOLN
100.0000 mL | Freq: Once | INTRAMUSCULAR | Status: AC | PRN
  Administered 2018-02-02: 100 mL via INTRAVENOUS

## 2018-02-02 MED ORDER — IOPAMIDOL (ISOVUE-300) INJECTION 61%: 100.0000 mL | Freq: Once | INTRAVENOUS | Status: DC | PRN

## 2018-02-02 MED ORDER — ONDANSETRON HCL 4 MG/2ML IJ SOLN
4.0000 mg | Freq: Once | INTRAMUSCULAR | Status: AC | PRN
  Administered 2018-02-02: 4 mg via INTRAVENOUS
  Filled 2018-02-02: qty 2

## 2018-02-02 NOTE — ED Triage Notes (Signed)
Patient arrived via EMS from home with spouse; Patient able to stand on EMS arrival but refused to walk to stretcher and had new c/o chest pain; Per EMS patient slept the entire way to ED and had no further c/o chest; patient was seen and d/c on 01/21/08 for abnormal labs and vomiting and constipation; Patient c/o vomiting since d/c on 01/20/18; EMS noted last vomiting episode about 12 hours prior to ED arrival today; Patient is a&ox 4 on arrival.-Monique,Rn

## 2018-02-02 NOTE — ED Notes (Signed)
Patient transported to X-ray 

## 2018-02-02 NOTE — ED Notes (Signed)
Patient transported to CT 

## 2018-02-03 LAB — POC URINE PREG, ED: Preg Test, Ur: NEGATIVE

## 2018-02-03 LAB — URINALYSIS, ROUTINE W REFLEX MICROSCOPIC
Bilirubin Urine: NEGATIVE
GLUCOSE, UA: NEGATIVE mg/dL
HGB URINE DIPSTICK: NEGATIVE
Ketones, ur: NEGATIVE mg/dL
Leukocytes, UA: NEGATIVE
NITRITE: NEGATIVE
PROTEIN: 100 mg/dL — AB
Specific Gravity, Urine: 1.017 (ref 1.005–1.030)
pH: 6 (ref 5.0–8.0)

## 2018-02-03 MED ORDER — ONDANSETRON HCL 4 MG/2ML IJ SOLN
4.0000 mg | Freq: Once | INTRAMUSCULAR | Status: AC
  Administered 2018-02-03: 4 mg via INTRAVENOUS
  Filled 2018-02-03: qty 2

## 2018-02-03 MED ORDER — FLEET ENEMA 7-19 GM/118ML RE ENEM
1.0000 | ENEMA | Freq: Once | RECTAL | Status: AC
  Administered 2018-02-03: 1 via RECTAL
  Filled 2018-02-03: qty 1

## 2018-02-03 MED ORDER — SODIUM CHLORIDE 0.9 % IV BOLUS
500.0000 mL | Freq: Once | INTRAVENOUS | Status: AC
Start: 2018-02-03 — End: 2018-02-03
  Administered 2018-02-03: 500 mL via INTRAVENOUS

## 2018-02-03 NOTE — ED Provider Notes (Signed)
Patient care signed out at end of shift from [facility], PA-C, pending re-evaluation after receiving enema for constipation. Patient here for nausea and vomiting as well, felt unrelated to constipation which was a finding on CT scan.   On re-eval, the patient has had a bowel movement and reports feeling improved. Discussed/reiterated plan of discharge that Mendel Ryder had with the patient prior to hand off. The patient understands and is comfortable with discharge home.   She can be discharged home per plan of previous treatment team.    Charlann Lange, PA-C 02/03/18 0250    Jola Schmidt, MD 02/05/18 1343

## 2018-02-03 NOTE — ED Notes (Signed)
ED Provider at bedside. 

## 2018-02-03 NOTE — Discharge Instructions (Addendum)
Take MiraLAX three times daily for the next 3 days, then return to your once daily dosing as usual.  Make sure you are staying hydrated and drinking plenty of fluids.  Also increasing your fiber intake can help with constipation.  Follow-up with your primary care doctor in the next 2 to 4 days for further evaluation.  Return to the emergency department for any worsening abdominal pain, chest pain, difficulty breathing, persistent vomiting, inability to eat or drink, fever or any other worsening or concerning symptoms.

## 2018-02-03 NOTE — ED Provider Notes (Signed)
[place] EMERGENCY DEPARTMENT Provider Note   CSN: [id number] Arrival date & time: 02/02/18  2144     History   Chief Complaint Chief Complaint  Patient presents with  . Chest Pain  . Emesis    HPI Anna [name] is a 57 y.o. female possible history of CHF, hypertension, hypothyroid, lupus who presents for evaluation of vomiting, constipation and chest pain.  Patient reports that she has been having intermittent vomiting over the last 2 weeks.  She states that her most recent episode started today.  She reports multiple episodes of nonbloody, nonbilious vomiting.  Patient reports that she is sometimes able to eat and drink and keep food down and other times it comes right back up.  Patient states that he is also been constipated over the last 2 weeks.  She cannot recall when her last bowel movement was but thinks it may have been 2 weeks ago.  Patient states that she tried over-the-counter laxatives with minimal improvement.  Patient states that she has tried to have a bowel movement has been unsuccessful.  She states that this started a few weeks ago after she was prescribed some narcotic medications for her lupus.  Patient reports that when she called the EMS, when she was getting on the stretcher, she started having some mild chest pain.  This is since resolved since being in the ED.  Patient states that she has had diffuse abdominal pain because she cannot have a bowel movement.  Patient denies any fevers, difficulty breathing, leg swelling, urinary complaints.  The history is provided by the patient.    Past Medical History:  Diagnosis Date  . Acute CHF ([place]) 07/2016  . Depressive disorder, not elsewhere classified   . Family history of adverse reaction to anesthesia    mother passed away during surgery  . Hypertension   . Hypothyroid    Dr. Wilson Singer  . Kidney stone   . Lupus (systemic lupus erythematosus) ([place])   . Migraine   . Pure hypercholesterolemia     . Stroke [facility])     Patient Active Problem List   Diagnosis Date Noted  . Pulmonary hypertension ([place]) 08/25/2016  . Pleural effusion   . Pulmonary edema   . SIRS (systemic inflammatory response syndrome) ([facility])   . Accidental drug overdose   . Acute diastolic CHF (congestive heart failure) ([place])   . HCAP (healthcare-associated pneumonia)   . Long-term use of Plaquenil   . History of lupus ([place])   . Altered mental status 08/04/2016  . AKI (acute kidney injury) ([place]) 08/04/2016  . Acute CHF (congestive heart failure) ([place]) 08/04/2016  . Anemia 08/04/2016  . Acute respiratory failure with hypoxia ([place]) 08/04/2016  . Acute encephalopathy 08/04/2016  . Migraine without aura 12/09/2012  . Unspecified constipation 12/08/2012  . Unspecified hypothyroidism 12/08/2012  . GERD (gastroesophageal reflux disease) 12/08/2012  . Multinodular goiter 08/20/2011  . Essential hypertension, benign 12/25/2010  . SLE (systemic lupus erythematosus) ([place]) 11/27/2010  . Depression 11/27/2010  . Hyperlipidemia 11/27/2010  . Hypothyroid 11/27/2010  . Encounter for long-term (current) use of other medications 11/27/2010    Past Surgical History:  Procedure Laterality Date  . CESAREAN SECTION    . ECTOPIC PREGNANCY SURGERY     times 2  . LAPAROSCOPY     with rt salpingectomy  . [place] SURGERY  2003  . TONSILLECTOMY    . TOTAL ABDOMINAL HYSTERECTOMY  2004   ovaries retained, DUB  . TUBAL  LIGATION  1985     OB History    Gravida  5   Para  1   Term  1   Preterm      AB  4   Living  1     SAB      TAB  2   Ectopic  2   Multiple      Live Births  1            Home Medications    Prior to Admission medications   Medication Sig Start Date End Date Taking? Authorizing Provider  amLODipine (NORVASC) 10 MG tablet Take 10 mg by mouth daily.    [provider]  atorvastatin (LIPITOR) 20 MG tablet Take 20 mg by mouth daily.    [provider]   azaTHIOprine (IMURAN) 50 MG tablet 50 mg daily. 02/20/17   [provider]  carbamazepine (TEGRETOL) 200 MG tablet Take 200-400 mg by mouth See admin instructions. Take 1 tablet (200 mg) by mouth every morning and 2 tablets (400 mg) at night    [provider]  diazepam (VALIUM) 10 MG tablet Take 10 mg by mouth every 6 (six) hours as needed for anxiety.    [provider]  escitalopram (LEXAPRO) 10 MG tablet Take 30 mg by mouth daily.    [provider]  folic acid (FOLVITE) 1 MG tablet Take 1 mg by mouth daily.    [provider]  furosemide (LASIX) 20 MG tablet 20 mg 2 (two) times daily. 02/09/17   [provider]  hydroxychloroquine (PLAQUENIL) 200 MG tablet 200 mg daily. 02/27/17   [provider]  levothyroxine (SYNTHROID, LEVOTHROID) 50 MCG tablet Take 50 mcg by mouth daily before breakfast.    [provider]  losartan (COZAAR) 100 MG tablet 100 mg daily. 02/26/17   [provider]  metoprolol succinate (TOPROL-XL) 50 MG 24 hr tablet Take 50 mg by mouth daily. Take with or immediately following a meal.    [provider]  predniSONE (DELTASONE) 10 MG tablet 5 mg.  01/12/17   [provider]  QUEtiapine (SEROQUEL) 200 MG tablet Take 200 mg by mouth at bedtime.    [provider]    Family History Family History  Problem Relation Age of Onset  . Diabetes Mother   . Hypertension Sister   . Diabetes Maternal Grandmother   . Autoimmune disease Neg Hx     Social History Social History   Tobacco Use  . Smoking status: Current Every Day Smoker    Packs/day: 0.50    Years: 35.00    Pack years: 17.50    Types: Cigarettes    Last attempt to quit: 08/04/2016    Years since quitting: 1.5  . Smokeless tobacco: Never Used  . Tobacco comment: Continued cessation encouraged  Substance Use Topics  . Alcohol use: Yes    Comment: 1 drink every 3-4 months.  . Drug use: No     Allergies    Patient has no known allergies.   Review of Systems Review of Systems  Constitutional: Negative for fever.  Respiratory: Negative for cough and shortness of breath.   Cardiovascular: Positive for chest pain (resolved).  Gastrointestinal: Positive for abdominal pain, constipation, nausea and vomiting.  Genitourinary: Negative for dysuria and hematuria.  Neurological: Negative for headaches.     Physical Exam Updated Vital Signs BP (!) 154/101   Pulse 86   Temp 99 F (37.2 C) (Oral)  Resp (!) 24   Ht 5\' 6"  (1.676 m)   Wt 78 kg (172 lb)   LMP 07/07/2002   SpO2 92%   BMI 27.76 kg/m   Physical Exam  Constitutional: She is oriented to person, place, and time. She appears well-developed and well-nourished.  Appears uncomfortable but no acute distress   HENT:  Head: Normocephalic and atraumatic.  Mouth/Throat: Oropharynx is clear and moist and mucous membranes are normal.  Eyes: Pupils are equal, round, and reactive to light. Conjunctivae, EOM and lids are normal.  Neck: Full passive range of motion without pain.  Cardiovascular: Normal rate, regular rhythm, normal heart sounds and normal pulses. Exam reveals no gallop and no friction rub.  No murmur heard. Pulmonary/Chest: Effort normal and breath sounds normal.  Lungs clear to auscultation bilaterally.  Symmetric chest rise.  No wheezing, rales, rhonchi.  Abdominal: Soft. Normal appearance and bowel sounds are normal. There is generalized tenderness. There is no rigidity, no guarding and no CVA tenderness.  Abdomen is soft, nondistended.  Bowel sounds present.  Tenderness with no focal point.  No CVA tenderness bilaterally.  Musculoskeletal: Normal range of motion.  Neurological: She is alert and oriented to person, place, and time.  Follows commands Moving all extremities spontaneously Normal coordination  Skin: Skin is warm and dry. Capillary refill takes less than 2 seconds.  Psychiatric: She has a normal mood and  affect. Her speech is normal.  Nursing note and vitals reviewed.    ED Treatments / Results  Labs (all labs ordered are listed, but only abnormal results are displayed) Labs Reviewed  COMPREHENSIVE METABOLIC PANEL - Abnormal; Notable for the following components:      Result Value   CO2 21 (*)    Glucose, Bld 151 (*)    Creatinine, Ser 1.65 (*)    GFR calc non Af Amer 33 (*)    GFR calc Af Amer 39 (*)    All other components within normal limits  CBC - Abnormal; Notable for the following components:   RBC 2.69 (*)    Hemoglobin 11.4 (*)    HCT 32.0 (*)    MCV 119.0 (*)    MCH 42.4 (*)    RDW 17.5 (*)    Platelets 411 (*)    All other components within normal limits  I-STAT BETA HCG BLOOD, ED ([facility], [facility], [facility] ONLY) - Abnormal; Notable for the following components:   I-stat hCG, quantitative 11.6 (*)    All other components within normal limits  LIPASE, BLOOD  URINALYSIS, ROUTINE W REFLEX MICROSCOPIC  I-STAT TROPONIN, ED  POC URINE PREG, ED    EKG None  Radiology Dg Chest 2 View  Result Date: 02/02/2018 CLINICAL DATA:  Chest pain EXAM: CHEST - 2 VIEW COMPARISON:  10/23/2016 FINDINGS: No acute consolidation or effusion. Borderline to mild cardiomegaly. No pneumothorax. IMPRESSION: No active cardiopulmonary disease.  Borderline cardiomegaly Electronically Signed   By: Donavan Foil M.D.   On: 02/02/2018 22:40    Procedures Procedures (including critical care time)  Medications Ordered in ED Medications  iopamidol (ISOVUE-300) 61 % injection 100 mL (has no administration in time range)  ondansetron (ZOFRAN) injection 4 mg (4 mg Intravenous Given 02/02/18 2215)  iohexol (OMNIPAQUE) 300 MG/ML solution 100 mL (100 mLs Intravenous Contrast Given 02/02/18 2353)     Initial Impression / Assessment and Plan / ED Course  I have reviewed the triage vital signs and the nursing notes.  Pertinent labs & imaging results that were  available during my care of the patient were reviewed  by me and considered in my medical decision making (see chart for details).     57 year old female past medical history CHF, lupus, who presents for evaluation of vomiting, constipation and chest pain.  It sounds like vomiting constipation her primary concerns is why she called EMS.  Throat started developing some chest pain when moving onto the stretcher of EMS.  Has since resolved.  No difficulty breathing, fever.  Reports vomiting has been intermittent for the last few weeks.  States that sometimes she is able to tolerate p.o. and sometimes not.  Constipation has been ongoing for last 2 weeks.  Cannot recall when she had a bowel movement.  No history of abdominal surgeries.  States she took over-the-counter laxatives with no improvement. Patient is afebrile, non-toxic appearing, sitting comfortably on examination table. Vital signs reviewed and stable.  Patient has diffuse abdominal tenderness.  Abdomen is soft, nondistended.  Low suspicion for obstruction but a consideration given vomiting and constipation.  Exam is not concerning for perforation.  Plan to check basic labs, chest x-ray.  Will evaluate chest pain given that she had a complaint of it.  Do not suspect ACS given history/physical exam.  History/physical exam is not concerning for PE.  I-STAT troponin negative.  CMP shows bicarb of 21, glucose of 151.  Creatinine is 1.65.  She does have history of AKI.  She has fluctuations in her creatinine and BUN.  Her most recent one a few weeks ago was 1.2.  Review of records show that she follows in between 1.2 and 1.8.  CBC shows hemoglobin of 11.4, hematocrit of 32.0.  Lipase unremarkable.  CT abd/pelvis show stool burden reflecting constipation.  No evidence of bowel obstruction.  Discussed results with patient.  She is not having any chest pain at this time.  Abdominal exam improved.  Patient did have one more episode of vomiting.  Will give additional antiemetics.  Discussed constipation with  patient.  We will plan to try for enema to see if that helps with constipation.  Patient signed out to [employer], PA-C.  Please see her note for further evaluation.  If patient is successful with bowel movement and can keep p.o. down, anticipate discharge.  Final Clinical Impressions(s) / ED Diagnoses   Final diagnoses:  Constipation, unspecified constipation type  Nausea and vomiting, intractability of vomiting not specified, unspecified vomiting type    ED Discharge Orders    None       Desma Mcgregor 02/03/18 2325    Jola Schmidt, MD 02/05/18 1344

## 2018-02-12 ENCOUNTER — Emergency Department (HOSPITAL_COMMUNITY): Payer: BLUE CROSS/BLUE SHIELD

## 2018-02-12 ENCOUNTER — Other Ambulatory Visit: Payer: Self-pay

## 2018-02-12 ENCOUNTER — Observation Stay (HOSPITAL_COMMUNITY)
Admission: EM | Admit: 2018-02-12 | Discharge: 2018-02-13 | Disposition: A | Discharge status: Home / Self Care | Payer: BLUE CROSS/BLUE SHIELD | Attending: Family Medicine | Admitting: Family Medicine

## 2018-02-12 ENCOUNTER — Encounter (HOSPITAL_COMMUNITY): Payer: Self-pay

## 2018-02-12 DIAGNOSIS — Z7952 Long term (current) use of systemic steroids: Secondary | ICD-10-CM | POA: Diagnosis not present

## 2018-02-12 DIAGNOSIS — F329 Major depressive disorder, single episode, unspecified: Secondary | ICD-10-CM | POA: Insufficient documentation

## 2018-02-12 DIAGNOSIS — N179 Acute kidney failure, unspecified: Secondary | ICD-10-CM | POA: Diagnosis present

## 2018-02-12 DIAGNOSIS — E042 Nontoxic multinodular goiter: Secondary | ICD-10-CM | POA: Diagnosis present

## 2018-02-12 DIAGNOSIS — Z7982 Long term (current) use of aspirin: Secondary | ICD-10-CM | POA: Insufficient documentation

## 2018-02-12 DIAGNOSIS — E785 Hyperlipidemia, unspecified: Secondary | ICD-10-CM | POA: Diagnosis not present

## 2018-02-12 DIAGNOSIS — I951 Orthostatic hypotension: Principal | ICD-10-CM

## 2018-02-12 DIAGNOSIS — D649 Anemia, unspecified: Secondary | ICD-10-CM | POA: Diagnosis not present

## 2018-02-12 DIAGNOSIS — K5900 Constipation, unspecified: Secondary | ICD-10-CM

## 2018-02-12 DIAGNOSIS — E860 Dehydration: Secondary | ICD-10-CM | POA: Insufficient documentation

## 2018-02-12 DIAGNOSIS — F32A Depression, unspecified: Secondary | ICD-10-CM | POA: Diagnosis present

## 2018-02-12 DIAGNOSIS — Z79899 Other long term (current) drug therapy: Secondary | ICD-10-CM | POA: Insufficient documentation

## 2018-02-12 DIAGNOSIS — I509 Heart failure, unspecified: Secondary | ICD-10-CM

## 2018-02-12 DIAGNOSIS — I130 Hypertensive heart and chronic kidney disease with heart failure and stage 1 through stage 4 chronic kidney disease, or unspecified chronic kidney disease: Secondary | ICD-10-CM | POA: Insufficient documentation

## 2018-02-12 DIAGNOSIS — I700 Atherosclerosis of aorta: Secondary | ICD-10-CM | POA: Diagnosis not present

## 2018-02-12 DIAGNOSIS — F17210 Nicotine dependence, cigarettes, uncomplicated: Secondary | ICD-10-CM | POA: Insufficient documentation

## 2018-02-12 DIAGNOSIS — Z8673 Personal history of transient ischemic attack (TIA), and cerebral infarction without residual deficits: Secondary | ICD-10-CM | POA: Diagnosis not present

## 2018-02-12 DIAGNOSIS — Z87442 Personal history of urinary calculi: Secondary | ICD-10-CM | POA: Diagnosis not present

## 2018-02-12 DIAGNOSIS — N183 Chronic kidney disease, stage 3 (moderate): Secondary | ICD-10-CM | POA: Diagnosis not present

## 2018-02-12 DIAGNOSIS — K219 Gastro-esophageal reflux disease without esophagitis: Secondary | ICD-10-CM | POA: Diagnosis present

## 2018-02-12 DIAGNOSIS — M329 Systemic lupus erythematosus, unspecified: Secondary | ICD-10-CM | POA: Diagnosis not present

## 2018-02-12 DIAGNOSIS — E039 Hypothyroidism, unspecified: Secondary | ICD-10-CM | POA: Diagnosis not present

## 2018-02-12 DIAGNOSIS — I10 Essential (primary) hypertension: Secondary | ICD-10-CM | POA: Diagnosis not present

## 2018-02-12 DIAGNOSIS — R112 Nausea with vomiting, unspecified: Secondary | ICD-10-CM | POA: Diagnosis present

## 2018-02-12 LAB — COMPREHENSIVE METABOLIC PANEL
ALT: 15 U/L (ref 0–44)
AST: 21 U/L (ref 15–41)
Albumin: 3.8 g/dL (ref 3.5–5.0)
Alkaline Phosphatase: 74 U/L (ref 38–126)
Anion gap: 10 (ref 5–15)
BUN: 30 mg/dL — ABNORMAL HIGH (ref 6–20)
CHLORIDE: 110 mmol/L (ref 98–111)
CO2: 20 mmol/L — ABNORMAL LOW (ref 22–32)
CREATININE: 2.04 mg/dL — AB (ref 0.44–1.00)
Calcium: 8.6 mg/dL — ABNORMAL LOW (ref 8.9–10.3)
GFR, EST AFRICAN AMERICAN: 30 mL/min — AB (ref >60)
GFR, EST NON AFRICAN AMERICAN: 26 mL/min — AB (ref >60)
Glucose, Bld: 108 mg/dL — ABNORMAL HIGH (ref 70–99)
POTASSIUM: 4.6 mmol/L (ref 3.5–5.1)
Sodium: 140 mmol/L (ref 135–145)
Total Bilirubin: 0.3 mg/dL (ref 0.3–1.2)
Total Protein: 7.4 g/dL (ref 6.5–8.1)

## 2018-02-12 LAB — CBC WITH DIFFERENTIAL/PLATELET
Basophils Absolute: 0 10*3/uL (ref 0.0–0.1)
Basophils Relative: 0 %
EOS PCT: 0 %
Eosinophils Absolute: 0 10*3/uL (ref 0.0–0.7)
HCT: 29.8 % — ABNORMAL LOW (ref 36.0–46.0)
Hemoglobin: 10.3 g/dL — ABNORMAL LOW (ref 12.0–15.0)
Lymphocytes Relative: 9 %
Lymphs Abs: 0.4 10*3/uL — ABNORMAL LOW (ref 0.7–4.0)
MCH: 39.9 pg — AB (ref 26.0–34.0)
MCHC: 34.6 g/dL (ref 30.0–36.0)
MCV: 115.5 fL — ABNORMAL HIGH (ref 78.0–100.0)
MONOS PCT: 1 %
Monocytes Absolute: 0.1 10*3/uL (ref 0.1–1.0)
Neutro Abs: 4 10*3/uL (ref 1.7–7.7)
Neutrophils Relative %: 90 %
PLATELETS: 371 10*3/uL (ref 150–400)
RBC: 2.58 MIL/uL — ABNORMAL LOW (ref 3.87–5.11)
RDW: 16 % — AB (ref 11.5–15.5)
WBC: 4.5 10*3/uL (ref 4.0–10.5)

## 2018-02-12 LAB — URINALYSIS, ROUTINE W REFLEX MICROSCOPIC
BILIRUBIN URINE: NEGATIVE
GLUCOSE, UA: NEGATIVE mg/dL
Hgb urine dipstick: NEGATIVE
KETONES UR: NEGATIVE mg/dL
Leukocytes, UA: NEGATIVE
Nitrite: NEGATIVE
PH: 5 (ref 5.0–8.0)
Protein, ur: NEGATIVE mg/dL
Specific Gravity, Urine: 1.005 (ref 1.005–1.030)

## 2018-02-12 LAB — LIPASE, BLOOD: LIPASE: 42 U/L (ref 11–51)

## 2018-02-12 MED ORDER — ASPIRIN 325 MG PO TABS: 500.0000 mg | ORAL_TABLET | Freq: Two times a day (BID) | ORAL | Status: DC

## 2018-02-12 MED ORDER — AZATHIOPRINE 50 MG PO TABS
50.0000 mg | ORAL_TABLET | Freq: Two times a day (BID) | ORAL | Status: DC
  Administered 2018-02-12 – 2018-02-13 (×2): 50 mg via ORAL
  Filled 2018-02-12 (×2): qty 1

## 2018-02-12 MED ORDER — HEPARIN SODIUM (PORCINE) 5000 UNIT/ML IJ SOLN
5000.0000 [IU] | Freq: Three times a day (TID) | INTRAMUSCULAR | Status: DC
  Administered 2018-02-12 – 2018-02-13 (×2): 5000 [IU] via SUBCUTANEOUS
  Filled 2018-02-12 (×2): qty 1

## 2018-02-12 MED ORDER — QUETIAPINE FUMARATE 300 MG PO TABS
400.0000 mg | ORAL_TABLET | Freq: Every day | ORAL | Status: DC
  Administered 2018-02-12: 400 mg via ORAL
  Filled 2018-02-12: qty 1

## 2018-02-12 MED ORDER — SODIUM CHLORIDE 0.9 % IV SOLN
10.0000 mg | Freq: Two times a day (BID) | INTRAVENOUS | Status: DC
  Administered 2018-02-12: 10 mg via INTRAVENOUS
  Filled 2018-02-12 (×2): qty 1

## 2018-02-12 MED ORDER — BISACODYL 10 MG RE SUPP: 10.0000 mg | Freq: Every day | RECTAL | Status: DC | PRN

## 2018-02-12 MED ORDER — ATORVASTATIN CALCIUM 20 MG PO TABS
20.0000 mg | ORAL_TABLET | Freq: Every day | ORAL | Status: DC
  Administered 2018-02-13: 20 mg via ORAL
  Filled 2018-02-12 (×2): qty 1

## 2018-02-12 MED ORDER — ONDANSETRON HCL 4 MG/2ML IJ SOLN: 4.0000 mg | Freq: Four times a day (QID) | INTRAMUSCULAR | Status: DC | PRN

## 2018-02-12 MED ORDER — ESCITALOPRAM OXALATE 10 MG PO TABS
30.0000 mg | ORAL_TABLET | Freq: Every day | ORAL | Status: DC
  Administered 2018-02-13: 30 mg via ORAL
  Filled 2018-02-12: qty 3

## 2018-02-12 MED ORDER — VITAMIN B-12 1000 MCG PO TABS
1000.0000 ug | ORAL_TABLET | Freq: Every day | ORAL | Status: DC
  Administered 2018-02-13: 1000 ug via ORAL
  Filled 2018-02-12: qty 1

## 2018-02-12 MED ORDER — PREDNISONE 20 MG PO TABS
10.0000 mg | ORAL_TABLET | Freq: Every day | ORAL | Status: DC
  Administered 2018-02-13: 10 mg via ORAL
  Filled 2018-02-12: qty 1

## 2018-02-12 MED ORDER — MILK AND MOLASSES ENEMA
1.0000 | Freq: Once | RECTAL | Status: AC
  Administered 2018-02-12: 250 mL via RECTAL
  Filled 2018-02-12: qty 250

## 2018-02-12 MED ORDER — SENNOSIDES-DOCUSATE SODIUM 8.6-50 MG PO TABS
1.0000 | ORAL_TABLET | Freq: Every evening | ORAL | Status: DC | PRN
Start: 2018-02-12 — End: 2018-02-13

## 2018-02-12 MED ORDER — LEVOTHYROXINE SODIUM 50 MCG PO TABS
50.0000 ug | ORAL_TABLET | Freq: Every day | ORAL | Status: DC
  Administered 2018-02-13: 50 ug via ORAL
  Filled 2018-02-12: qty 1

## 2018-02-12 MED ORDER — SODIUM CHLORIDE 0.9 % IV BOLUS
1000.0000 mL | Freq: Once | INTRAVENOUS | Status: AC
  Administered 2018-02-12: 1000 mL via INTRAVENOUS

## 2018-02-12 MED ORDER — ACETAMINOPHEN 650 MG RE SUPP: 650.0000 mg | Freq: Four times a day (QID) | RECTAL | Status: DC | PRN

## 2018-02-12 MED ORDER — POLYETHYLENE GLYCOL 3350 17 G PO PACK
17.0000 g | PACK | Freq: Two times a day (BID) | ORAL | Status: DC
  Administered 2018-02-12 – 2018-02-13 (×2): 17 g via ORAL
  Filled 2018-02-12 (×2): qty 1

## 2018-02-12 MED ORDER — HYDROXYCHLOROQUINE SULFATE 200 MG PO TABS
200.0000 mg | ORAL_TABLET | Freq: Every day | ORAL | Status: DC
  Administered 2018-02-13: 200 mg via ORAL
  Filled 2018-02-12: qty 1

## 2018-02-12 MED ORDER — IOPAMIDOL (ISOVUE-300) INJECTION 61%: 30.0000 mL | Freq: Once | INTRAVENOUS | Status: DC | PRN

## 2018-02-12 MED ORDER — ONDANSETRON HCL 4 MG/2ML IJ SOLN: 4.0000 mg | Freq: Once | INTRAMUSCULAR | Status: DC

## 2018-02-12 MED ORDER — FOLIC ACID 1 MG PO TABS
1.0000 mg | ORAL_TABLET | Freq: Every day | ORAL | Status: DC
  Administered 2018-02-13: 1 mg via ORAL
  Filled 2018-02-12: qty 1

## 2018-02-12 MED ORDER — ACETAMINOPHEN 325 MG PO TABS: 650.0000 mg | ORAL_TABLET | Freq: Four times a day (QID) | ORAL | Status: DC | PRN

## 2018-02-12 MED ORDER — SODIUM CHLORIDE 0.9 % IV SOLN
INTRAVENOUS | Status: DC
  Administered 2018-02-12 – 2018-02-13 (×3): via INTRAVENOUS

## 2018-02-12 MED ORDER — ASPIRIN 325 MG PO TABS
487.5000 mg | ORAL_TABLET | Freq: Two times a day (BID) | ORAL | Status: DC
  Administered 2018-02-13: 487.5 mg via ORAL
  Filled 2018-02-12: qty 2

## 2018-02-12 MED ORDER — ONDANSETRON HCL 4 MG PO TABS: 4.0000 mg | ORAL_TABLET | Freq: Four times a day (QID) | ORAL | Status: DC | PRN

## 2018-02-12 NOTE — Care Management Note (Signed)
Case Management Note  CM noted pt's PCP made a [facility] referral on 8/2.  CM contacted pt's PCP office to find out [facility] agency.  CM contacted [facility] to advise that pt was in the ED and being admitted.  Neave Lenger, Benjaman Lobe, RN 02/12/2018, 3:53 PM

## 2018-02-12 NOTE — ED Triage Notes (Signed)
Pt arrived via EMS from PCP office in where she was getting treatments for Lupus. Pt reports N/v x 3 weeks, and was orthostatic and hypotensive. Pt denies vomiting in the past 24 hours.

## 2018-02-12 NOTE — H&P (Addendum)
History and Physical    Anna [name]:[id number] DOB: 02-Apr-1961 DOA: 02/12/2018   PCP: [name], MD   Patient coming from:  Home    Chief Complaint:  Nausea, vomiting  and severe constipation, generalized weakness   HPI: Anna [name] is a 57 y.o. female with a history of CHF, hypertension, hypothyroidism, history of lupus on Imuran and chronic prednisone, and a recent presentation to the emergency department with intermittent nausea and vomiting for 2 weeks, minimal discomfort, felt to be related to constipation without bowel obstruction.  At that time, she had an enema with significant relief of her symptoms.  She was discharged in stable condition.  Over the last 2 weeks, the patient had intermittent episodes of constipation once again, and over the last 24 hours, she developed progressive nausea, nonbloody emesis without diarrhea.  Her constipation has been worse, and she states that has not had a bowel movement for at least 3 or 4 days, and is harder for her to produce feces.  She has been taking Gatorade, and some antiemetics as an outpatient.  She denies any sick contacts, or food poisoning.  She has no obvious appetite due to symptoms.  She denies any tobacco, alcohol or recreational drug use. she denies any fever, chills or night sweats.  She denies any chest pain or shortness of breath.  She has been feeling very weak, at times dizzy when she "gets up too quickly."  Remote history of seizures, she is now off of medication, due to absence of them for  many years.  She denies any syncopal episode, presyncope, but she is dizzy at the time of admission although she really denies any vertigo, or vision changes.  She has chronic headaches, but not recently.   ED Course:  BP (!) 135/101   Pulse 93   Temp 98 F (36.7 C)   Resp 18   Ht 5\' 6"  (1.676 m)   Wt 78 kg   LMP 07/07/2002   SpO2 95%   BMI 27.75 kg/m   She was noted to be orthostatic, with BP 104/68 pulse 90 to lay  down, sitting 105/70 over 92, and standing 83/63 pulse 100 She was given 1 L of IV fluids, and now is being maintained at around 100 cc an hour, with good response. The patient continues to be nauseous, and has some nausea, although the antiemetics improve her symptoms. CT of the abdomen and pelvis is negative for bowel obstruction, and does show large stool burden reflecting constipation.  No inflammatory issues For her constipation, the patient is to receive an enema today hopefully with resolution WBC is normal. Creatinine 2.04 BUN 30 calcium 8.6, hemoglobin 10.3,  Review of Systems:  As per HPI otherwise all other systems reviewed and are negative  Past Medical History:  Diagnosis Date  . Acute CHF ([facility]) 07/2016  . Depressive disorder, not elsewhere classified   . Family history of adverse reaction to anesthesia    mother passed away during surgery  . Hypertension   . Hypothyroid    Dr. Wilson Singer  . Kidney stone   . Lupus (systemic lupus erythematosus) ([facility])   . Migraine   . Pure hypercholesterolemia   . Stroke [facility])     Past Surgical History:  Procedure Laterality Date  . CESAREAN SECTION    . ECTOPIC PREGNANCY SURGERY     times 2  . LAPAROSCOPY     with rt salpingectomy  . [place] SURGERY  2003  .  TONSILLECTOMY    . TOTAL ABDOMINAL HYSTERECTOMY  2004   ovaries retained, DUB  . TUBAL LIGATION  1985    Social History Social History   Socioeconomic History  . Marital status: Married    Spouse name: Ludwig Clarks  . Number of children: 1  . Years of education: 92  . Highest education level: Not on file  Occupational History    Employer: UNEMPLOYED  Social Needs  . Financial resource strain: Not on file  . Food insecurity:    Worry: Not on file    Inability: Not on file  . Transportation needs:    Medical: Not on file    Non-medical: Not on file  Tobacco Use  . Smoking status: Current Every Day Smoker    Packs/day: 0.50    Years: 35.00    Pack years: 17.50      Types: Cigarettes    Last attempt to quit: 08/04/2016    Years since quitting: 1.5  . Smokeless tobacco: Never Used  . Tobacco comment: Continued cessation encouraged  Substance and Sexual Activity  . Alcohol use: Yes    Comment: 1 drink every 3-4 months.  . Drug use: No  . Sexual activity: Yes    Partners: Male    Birth control/protection: Surgical    Comment: TAH  Lifestyle  . Physical activity:    Days per week: Not on file    Minutes per session: Not on file  . Stress: Not on file  Relationships  . Social connections:    Talks on phone: Not on file    Gets together: Not on file    Attends religious service: Not on file    Active member of club or organization: Not on file    Attends meetings of clubs or organizations: Not on file    Relationship status: Not on file  . Intimate partner violence:    Fear of current or ex partner: Not on file    Emotionally abused: Not on file    Physically abused: Not on file    Forced sexual activity: Not on file  Other Topics Concern  . 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   Patient has one son, lives in [place].   Patient is disabled.   Patient has a college education.   Patient is right-handed.   Patient drinks some caffeine.     No Known Allergies  Family History  Problem Relation Age of Onset  . Diabetes Mother   . Hypertension Sister   . Diabetes Maternal Grandmother   . Autoimmune disease Neg Hx        Prior to Admission medications   Medication Sig Start Date End Date Taking? Authorizing Provider  amLODipine (NORVASC) 10 MG tablet Take 10 mg by mouth daily.   Yes [provider]  aspirin 500 MG tablet Take 500 mg by mouth 2 (two) times daily.   Yes [provider]  atorvastatin (LIPITOR) 20 MG tablet Take 20 mg by mouth daily.   Yes [provider]  azaTHIOprine (IMURAN) 50 MG tablet Take 50 mg by mouth 2 (two) times  daily.  02/20/17  Yes [provider]  diazepam (VALIUM) 10 MG tablet Take 10 mg by mouth every 8 (eight) hours as needed for anxiety.    Yes [provider]  diphenhydrAMINE (BENADRYL) 25 MG tablet Take 25 mg by mouth at bedtime.   Yes  [provider]  escitalopram (LEXAPRO) 10 MG tablet Take 30 mg by mouth daily.   Yes [provider]  folic acid (FOLVITE) 1 MG tablet Take 1 mg by mouth daily.   Yes [provider]  hydroxychloroquine (PLAQUENIL) 200 MG tablet Take 200 mg by mouth daily.  02/27/17  Yes [provider]  levothyroxine (SYNTHROID, LEVOTHROID) 50 MCG tablet Take 50 mcg by mouth daily before breakfast.   Yes [provider]  losartan (COZAAR) 100 MG tablet Take 100 mg by mouth daily.  02/26/17  Yes [provider]  metoprolol succinate (TOPROL-XL) 50 MG 24 hr tablet Take 50 mg by mouth daily. Take with or immediately following a meal.   Yes [provider]  omeprazole (PRILOSEC) 20 MG capsule Take 20 mg by mouth daily.   Yes [provider]  predniSONE (DELTASONE) 10 MG tablet Take 10 mg by mouth daily with breakfast.   Yes [provider]  QUEtiapine (SEROQUEL) 400 MG tablet Take 400 mg by mouth at bedtime.   Yes [provider]  sodium bicarbonate 650 MG tablet Take 1,300 mg by mouth 2 (two) times daily.   Yes [provider]  spironolactone (ALDACTONE) 25 MG tablet Take 25 mg by mouth every other day.   Yes [provider]  vitamin B-12 (CYANOCOBALAMIN) 1000 MCG tablet Take 1,000 mcg by mouth daily.   Yes [provider]  Aspirin-Acetaminophen-Caffeine (GOODY HEADACHE PO) Take 1 packet by mouth daily as needed (for headaches or pain).    [provider]  ranitidine (ZANTAC 75) 75 MG tablet Take 75 mg by mouth 2 (two) times daily as needed for indigestion.    [provider]     Physical Exam:  Vitals:   02/12/18 1550 02/12/18 1551  02/12/18 1600 02/12/18 1630  BP: 116/81  115/81 (!) 135/101  Pulse: 88  88 93  Resp: (!) 25  (!) 22 18  Temp:      SpO2: 93%  90% 95%  Weight:  78 kg    Height:  5\' 6"  (1.676 m)     Constitutional: NAD, but ill appearing Eyes: PERRL, lids and conjunctivae normal ENMT: Mucous membranes are dry, without exudate or lesions  Neck: normal, supple, no masses, no thyromegaly Respiratory: Tachypneic, but clear to auscultation bilaterally, no wheezing, no crackles. Normal respiratory effort  Cardiovascular: Regular rate and rhythm,  murmur, rubs or gallops. No extremity edema. 2+ pedal pulses. No carotid bruits.  Abdomen:  generalized tenderness to palpation, with some distention, no hepatosplenomegaly. Bowel sounds positive.  Musculoskeletal: no clubbing / cyanosis. Moves all extremities Skin: no jaundice, No lesions.  Neurologic: Sensation intact  Strength equal in all extremities Psychiatric:   Alert and oriented x 3. Normal mood.     Labs on Admission: I have personally reviewed following labs and imaging studies  CBC: Recent Labs  Lab 02/12/18 1213  WBC 4.5  NEUTROABS 4.0  HGB 10.3*  HCT 29.8*  MCV 115.5*  PLT 970    Basic Metabolic Panel: Recent Labs  Lab 02/12/18 1213  NA 140  K 4.6  CL 110  CO2 20*  GLUCOSE 108*  BUN 30*  CREATININE 2.04*  CALCIUM 8.6*    GFR: Estimated Creatinine Clearance: 31.7 mL/min (A) (by C-G formula based on SCr of 2.04 mg/dL (H)).  Liver Function Tests: Recent Labs  Lab 02/12/18 1213  AST 21  ALT 15  ALKPHOS 74  BILITOT 0.3  PROT 7.4  ALBUMIN 3.8  Recent Labs  Lab 02/12/18 1213  LIPASE 42   No results for input(s): AMMONIA in the last 168 hours.  Coagulation Profile: No results for input(s): INR, PROTIME in the last 168 hours.  Cardiac Enzymes: No results for input(s): CKTOTAL, CKMB, CKMBINDEX, TROPONINI in the last 168 hours.  BNP (last 3 results) No results for input(s): PROBNP in the last 8760  hours.  HbA1C: No results for input(s): HGBA1C in the last 72 hours.  CBG: No results for input(s): GLUCAP in the last 168 hours.  Lipid Profile: No results for input(s): CHOL, HDL, LDLCALC, TRIG, CHOLHDL, LDLDIRECT in the last 72 hours.  Thyroid Function Tests: No results for input(s): TSH, T4TOTAL, FREET4, T3FREE, THYROIDAB in the last 72 hours.  Anemia Panel: No results for input(s): VITAMINB12, FOLATE, FERRITIN, TIBC, IRON, RETICCTPCT in the last 72 hours.  Urine analysis:    Component Value Date/Time   COLORURINE YELLOW 02/02/2018 2353   APPEARANCEUR HAZY (A) 02/02/2018 2353   LABSPEC 1.017 02/02/2018 2353   PHURINE 6.0 02/02/2018 2353   GLUCOSEU NEGATIVE 02/02/2018 2353   HGBUR NEGATIVE 02/02/2018 2353   BILIRUBINUR NEGATIVE 02/02/2018 2353   BILIRUBINUR n 07/20/2013 0948   KETONESUR NEGATIVE 02/02/2018 2353   PROTEINUR 100 (A) 02/02/2018 2353   UROBILINOGEN 0.2 05/11/2015 2201   NITRITE NEGATIVE 02/02/2018 2353   LEUKOCYTESUR NEGATIVE 02/02/2018 2353    Sepsis Labs: @LABRCNTIP (procalcitonin:4,lacticidven:4) )No results found for this or any previous visit (from the past 240 hour(s)).   Radiological Exams on Admission: Ct Abdomen Pelvis Wo Contrast  Result Date: 02/12/2018 CLINICAL DATA:  57 year old with nausea and vomiting for 3 weeks. Hypotensive. EXAM: CT ABDOMEN AND PELVIS WITHOUT CONTRAST TECHNIQUE: Multidetector CT imaging of the abdomen and pelvis was performed following the standard protocol without IV contrast. COMPARISON:  02/02/2018 FINDINGS: Lower chest: Again noted are small bulla or cystic changes in the right lower lobe. Motion artifact at the lung bases. Stable scarring in the left lower lobe. Hepatobiliary: Limited evaluation on this non contrast examination. Configuration the gallbladder is grossly unchanged from the recent comparison examination. No significant biliary dilatation. Pancreas: Unremarkable. No pancreatic ductal dilatation or surrounding  inflammatory changes. Spleen: Normal in size without focal abnormality. Adrenals/Urinary Tract: Normal adrenal glands. Urinary bladder is mildly distended. Normal appearance of both kidneys without hydronephrosis or stones. Again noted are low-density cysts in the right kidney. Stomach/Bowel: Oral contrast in the stomach and small bowel. Again noted is a very large amount of stool throughout the colon without colonic distention. Cecum is located near the mid abdomen. Transverse colon extends all the way down to the lower pelvis and contains a large amount of stool. Rectum is decompressed with minimal stool. No evidence for bowel inflammation. Vascular/Lymphatic: Atherosclerotic calcifications in the aorta and iliac arteries without aneurysm. No significant lymph node enlargement in the abdomen or pelvis. Reproductive: Uterus has been removed. No evidence for an adnexal mass. Other: Negative for ascites. Negative for free air. Stable small umbilical hernia containing fat. Evidence for tiny adjacent periumbilical hernias. Musculoskeletal: Stable disc space narrowing and disease at L4-L5. IMPRESSION: Large colonic stool burden, particularly involving the right colon and transverse colon. Findings are similar to the recent comparison examination. Findings are compatible with constipation. No evidence for bowel obstruction or inflammation. Small ventral hernias. Electronically Signed   By: Markus Daft M.D.   On: 02/12/2018 15:08    EKG: Independently reviewed.  Assessment/Plan Principal Problem:   Constipation in female Active Problems:   Orthostatic hypotension  SLE (systemic lupus erythematosus) (HCC)   Depression   Hyperlipidemia   Hypothyroid   Essential hypertension, benign   Multinodular goiter   GERD (gastroesophageal reflux disease)   AKI (acute kidney injury) (HCC)   Congestive heart failure (CHF) (HCC)   Anemia   History of TIA (transient ischemic attack)   Intractable nausea and  vomiting  Intractable nausea and vomiting severe acute on chronic  Constipation, contributing factors, may be poor oral intake, decreased mobility, and chronic pain medications, lupus, hypothyroidism, vital signs are now more stable (the patient was orthostatic  on  Admission), white count is normal.  T of the abdomen and pelvis is negative for obstruction, or inflammatory process.  She has large stool burden.  2 weeks ago, the patient had similar episode, with resolution with enema.  At the ER she received 1 enema, IV fluids, antiemetics, with some improvement of her symptoms.  Lipase normal  Admit to med surg Bowel rest  Zofran q 8 prn with breakthrough Phenergan  IV fluids at 100 cc/h NS CBC in a.m.     Orthostatic Hypotension in a patient with a history of HTN: Likely due to volume loss, in the setting of med effect.  The patient takes 3 different blood pressure medications, which may have exacerbated her symptoms. Septic shock doubted as no source of infection or fever is noted BP improvement with  1 NS , now at 100 cc/h (and appropriate urine output). EKG and TN unremarkable   Check Orthostatics  IVF NS  If BP not improving after IVF, will proceed with other orthostatic workup, ie cortisol level, etc  Hold BP meds, will need adjustment of her medications, will have to discontinue 1 of the agents.  Anemia without blood loss  Hemoglobin on admission 10.3 at baseline     Repeat CBC in am  No transfusion is indicated at this time  Acute on Chronic CKD likely due to volume loss :   Current Cr is  2.04 , BL 1.3  Lab Results  Component Value Date   CREATININE 2.04 (H) 02/12/2018   CREATININE 1.65 (H) 02/02/2018   CREATININE 1.37 (H) 08/16/2016  Hold diuretics , NSAIDS  IVF  Follow BMP daily  Hyperlipidemia Continue home statins   History of lupus, the patient is on Imuran and steroids chronically. Continue medicines, platelets are holding well, currently 371.  Hypothyroidism,  history of goiter : Continue home Synthroid  GERD, no acute symptoms Continue PPI    Depression Continue home Lexapro   DVT prophylaxis:  Heparin  Code Status:   FUll code  Family Communication:  Discussed with patient Disposition Plan: Expect patient to be discharged to home after condition improves Consults called:    None  Admission status:  [place]    Sharene Butters, PA-C [facility]   Amion text  [phone]   02/12/2018, 4:58 PM

## 2018-02-12 NOTE — ED Notes (Signed)
Called report to [facility]

## 2018-02-12 NOTE — ED Notes (Signed)
ED TO INPATIENT HANDOFF REPORT  Name/Age/Gender Anna [name] 57 y.o. female  Code Status    Code Status Orders  (From admission, onward)         Start     Ordered   02/12/18 1609  Full code  Continuous     02/12/18 1610        Code Status History    Date Active Date Inactive Code Status Order ID Comments User Context   08/04/2016 2209 08/16/2016 1942 Full Code [id number]  Vianne Bulls, MD ED      Home/SNF/Other Home  Chief Complaint neausea; hypotension  Level of Care/Admitting Diagnosis ED Disposition    ED Disposition Condition [place]: [facility] [[id number]]  Level of Care: Med-Surg [16]  Diagnosis: Intractable nausea and vomiting [761950]  Admitting Physician: Mariel Aloe [[id number]]  Attending Physician: Mariel Aloe [phone]  PT Class (Do Not Modify): Observation [104]  PT Acc Code (Do Not Modify): Observation [10022]       Medical History Past Medical History:  Diagnosis Date  . Acute CHF ([place]) 07/2016  . Depressive disorder, not elsewhere classified   . Family history of adverse reaction to anesthesia    mother passed away during surgery  . Hypertension   . Hypothyroid    Dr. Wilson Singer  . Kidney stone   . Lupus (systemic lupus erythematosus) ([place])   . Migraine   . Pure hypercholesterolemia   . Stroke [facility])     Allergies No Known Allergies  IV Location/Drains/Wounds Patient Lines/Drains/Airways Status   Active Line/Drains/Airways    Name:   Placement date:   Placement time:   Site:   Days:   Peripheral IV 02/12/18 Right Antecubital   02/12/18    1211    Antecubital   less than 1          Labs/Imaging Results for orders placed or performed during the hospital encounter of 02/12/18 (from the past 48 hour(s))  CBC with Differential/Platelet     Status: Abnormal   Collection Time: 02/12/18 12:13 PM  Result Value Ref Range   WBC 4.5 4.0 - 10.5 K/uL   RBC 2.58 (L) 3.87 - 5.11 MIL/uL    Hemoglobin 10.3 (L) 12.0 - 15.0 g/dL   HCT 29.8 (L) 36.0 - 46.0 %   MCV 115.5 (H) 78.0 - 100.0 fL   MCH 39.9 (H) 26.0 - 34.0 pg   MCHC 34.6 30.0 - 36.0 g/dL   RDW 16.0 (H) 11.5 - 15.5 %   Platelets 371 150 - 400 K/uL   Neutrophils Relative % 90 %   Neutro Abs 4.0 1.7 - 7.7 K/uL   Lymphocytes Relative 9 %   Lymphs Abs 0.4 (L) 0.7 - 4.0 K/uL   Monocytes Relative 1 %   Monocytes Absolute 0.1 0.1 - 1.0 K/uL   Eosinophils Relative 0 %   Eosinophils Absolute 0.0 0.0 - 0.7 K/uL   Basophils Relative 0 %   Basophils Absolute 0.0 0.0 - 0.1 K/uL   Smear Review MORPHOLOGY UNREMARKABLE     Comment: Performed at [facility], [place] [address] [place], [place] [address]  Comprehensive metabolic panel     Status: Abnormal   Collection Time: 02/12/18 12:13 PM  Result Value Ref Range   Sodium 140 135 - 145 mmol/L   Potassium 4.6 3.5 - 5.1 mmol/L   Chloride 110 98 - 111 mmol/L   CO2 20 (L) 22 - 32  mmol/L   Glucose, Bld 108 (H) 70 - 99 mg/dL   BUN 30 (H) 6 - 20 mg/dL   Creatinine, Ser 2.04 (H) 0.44 - 1.00 mg/dL   Calcium 8.6 (L) 8.9 - 10.3 mg/dL   Total Protein 7.4 6.5 - 8.1 g/dL   Albumin 3.8 3.5 - 5.0 g/dL   AST 21 15 - 41 U/L   ALT 15 0 - 44 U/L   Alkaline Phosphatase 74 38 - 126 U/L   Total Bilirubin 0.3 0.3 - 1.2 mg/dL   GFR calc non Af Amer 26 (L) >60 mL/min   GFR calc Af Amer 30 (L) >60 mL/min    Comment: (NOTE) The eGFR has been calculated using the CKD EPI equation. This calculation has not been validated in all clinical situations. eGFR's persistently <60 mL/min signify possible Chronic Kidney Disease.    Anion gap 10 5 - 15    Comment: Performed at [facility], [place] [address] [place], [place] [address]  Lipase, blood     Status: None   Collection Time: 02/12/18 12:13 PM  Result Value Ref Range   Lipase 42 11 - 51 U/L    Comment: Performed at [facility], [place] [address] [place], [place] [address]   Ct Abdomen Pelvis  Wo Contrast  Result Date: 02/12/2018 CLINICAL DATA:  57 year old with nausea and vomiting for 3 weeks. Hypotensive. EXAM: CT ABDOMEN AND PELVIS WITHOUT CONTRAST TECHNIQUE: Multidetector CT imaging of the abdomen and pelvis was performed following the standard protocol without IV contrast. COMPARISON:  02/02/2018 FINDINGS: Lower chest: Again noted are small bulla or cystic changes in the right lower lobe. Motion artifact at the lung bases. Stable scarring in the left lower lobe. Hepatobiliary: Limited evaluation on this non contrast examination. Configuration the gallbladder is grossly unchanged from the recent comparison examination. No significant biliary dilatation. Pancreas: Unremarkable. No pancreatic ductal dilatation or surrounding inflammatory changes. Spleen: Normal in size without focal abnormality. Adrenals/Urinary Tract: Normal adrenal glands. Urinary bladder is mildly distended. Normal appearance of both kidneys without hydronephrosis or stones. Again noted are low-density cysts in the right kidney. Stomach/Bowel: Oral contrast in the stomach and small bowel. Again noted is a very large amount of stool throughout the colon without colonic distention. Cecum is located near the mid abdomen. Transverse colon extends all the way down to the lower pelvis and contains a large amount of stool. Rectum is decompressed with minimal stool. No evidence for bowel inflammation. Vascular/Lymphatic: Atherosclerotic calcifications in the aorta and iliac arteries without aneurysm. No significant lymph node enlargement in the abdomen or pelvis. Reproductive: Uterus has been removed. No evidence for an adnexal mass. Other: Negative for ascites. Negative for free air. Stable small umbilical hernia containing fat. Evidence for tiny adjacent periumbilical hernias. Musculoskeletal: Stable disc space narrowing and disease at L4-L5. IMPRESSION: Large colonic stool burden, particularly involving the right colon and transverse  colon. Findings are similar to the recent comparison examination. Findings are compatible with constipation. No evidence for bowel obstruction or inflammation. Small ventral hernias. Electronically Signed   By: Markus Daft M.D.   On: 02/12/2018 15:08    Pending Labs Unresulted Labs (From admission, onward)    Start     Ordered   02/13/18 6834  Basic metabolic panel  Tomorrow morning,   R     02/12/18 1610   02/13/18 0500  CBC  Tomorrow morning,   R     02/12/18 1610   02/12/18 1611  Urinalysis, Routine w reflex microscopic  Once,   R     02/12/18 1610          Vitals/Pain Today's Vitals   02/12/18 1550 02/12/18 1551 02/12/18 1600 02/12/18 1630  BP: 116/81  115/81 (!) 135/101  Pulse: 88  88 93  Resp: (!) 25  (!) 22 18  Temp:      SpO2: 93%  90% 95%  Weight:  78 kg    Height:  '5\' 6"'$  (1.676 m)    PainSc:        Isolation Precautions No active isolations  Medications Medications  0.9 %  sodium chloride infusion ( Intravenous New Bag/Given 02/12/18 1335)  ondansetron (ZOFRAN) injection 4 mg (0 mg Intravenous Hold 02/12/18 1217)  iopamidol (ISOVUE-300) 61 % injection 30 mL (has no administration in time range)  milk and molasses enema (has no administration in time range)  aspirin tablet 487.5 mg (has no administration in time range)  hydroxychloroquine (PLAQUENIL) tablet 200 mg (has no administration in time range)  atorvastatin (LIPITOR) tablet 20 mg (has no administration in time range)  escitalopram (LEXAPRO) tablet 30 mg (has no administration in time range)  QUEtiapine (SEROQUEL) tablet 400 mg (has no administration in time range)  levothyroxine (SYNTHROID, LEVOTHROID) tablet 50 mcg (has no administration in time range)  predniSONE (DELTASONE) tablet 10 mg (has no administration in time range)  famotidine (PEPCID) IVPB 20 mg premix (has no administration in time range)  folic acid (FOLVITE) tablet 1 mg (has no administration in time range)  azaTHIOprine (IMURAN) tablet 50  mg (has no administration in time range)  acetaminophen (TYLENOL) tablet 650 mg (has no administration in time range)    Or  acetaminophen (TYLENOL) suppository 650 mg (has no administration in time range)  senna-docusate (Senokot-S) tablet 1 tablet (has no administration in time range)  bisacodyl (DULCOLAX) suppository 10 mg (has no administration in time range)  ondansetron (ZOFRAN) tablet 4 mg (has no administration in time range)    Or  ondansetron (ZOFRAN) injection 4 mg (has no administration in time range)  heparin injection 5,000 Units (has no administration in time range)  sodium chloride 0.9 % bolus 1,000 mL (1,000 mLs Intravenous New Bag/Given 02/12/18 1216)    Mobility walks

## 2018-02-12 NOTE — ED Notes (Signed)
Bed: WA07 Expected date:  Expected time:  Means of arrival:  Comments: EMS- lupus/orthostatic changes

## 2018-02-12 NOTE — ED Provider Notes (Signed)
[facility] DEPT Provider Note   CSN: [id number] Arrival date & time: 02/12/18  1108     History   Chief Complaint Chief Complaint  Patient presents with  . Nausea  . Hypotension    HPI Anna [name] is a 57 y.o. female.  57 year old female presents with 2-week history of intermittent emesis as well as increased abdominal distention.  Denies any symptoms.  No chest pain or shortness of breath.  States that she has not passed her bowels in several days.  No fever or chills.  Is currently receiving treatments for lupus at her doctor's office although she is unsure what she is receiving.  Had more nonbilious bloody emesis today and had episode of hypotension was found to be orthostatic by her physician in the office today.  Was transported here for further management.     Past Medical History:  Diagnosis Date  . Acute CHF ([place]) 07/2016  . Depressive disorder, not elsewhere classified   . Family history of adverse reaction to anesthesia    mother passed away during surgery  . Hypertension   . Hypothyroid    Dr. Wilson Singer  . Kidney stone   . Lupus (systemic lupus erythematosus) ([place])   . Migraine   . Pure hypercholesterolemia   . Stroke [facility])     Patient Active Problem List   Diagnosis Date Noted  . Pulmonary hypertension ([place]) 08/25/2016  . Pleural effusion   . Pulmonary edema   . SIRS (systemic inflammatory response syndrome) ([facility])   . Accidental drug overdose   . Acute diastolic CHF (congestive heart failure) ([place])   . HCAP (healthcare-associated pneumonia)   . Long-term use of Plaquenil   . History of lupus ([place])   . Altered mental status 08/04/2016  . AKI (acute kidney injury) ([place]) 08/04/2016  . Acute CHF (congestive heart failure) ([place]) 08/04/2016  . Anemia 08/04/2016  . Acute respiratory failure with hypoxia ([place]) 08/04/2016  . Acute encephalopathy 08/04/2016  . Migraine without aura 12/09/2012  . Unspecified constipation  12/08/2012  . Unspecified hypothyroidism 12/08/2012  . GERD (gastroesophageal reflux disease) 12/08/2012  . Multinodular goiter 08/20/2011  . Essential hypertension, benign 12/25/2010  . SLE (systemic lupus erythematosus) ([place]) 11/27/2010  . Depression 11/27/2010  . Hyperlipidemia 11/27/2010  . Hypothyroid 11/27/2010  . Encounter for long-term (current) use of other medications 11/27/2010    Past Surgical History:  Procedure Laterality Date  . CESAREAN SECTION    . ECTOPIC PREGNANCY SURGERY     times 2  . LAPAROSCOPY     with rt salpingectomy  . [place] SURGERY  2003  . TONSILLECTOMY    . TOTAL ABDOMINAL HYSTERECTOMY  2004   ovaries retained, DUB  . TUBAL LIGATION  1985     OB History    Gravida  5   Para  1   Term  1   Preterm      AB  4   Living  1     SAB      TAB  2   Ectopic  2   Multiple      Live Births  1            Home Medications    Prior to Admission medications   Medication Sig Start Date End Date Taking? Authorizing Provider  amLODipine (NORVASC) 10 MG tablet Take 10 mg by mouth daily.   Yes [provider]  aspirin 500 MG tablet Take 500 mg by mouth  2 (two) times daily.   Yes [provider]  atorvastatin (LIPITOR) 20 MG tablet Take 20 mg by mouth daily.   Yes [provider]  azaTHIOprine (IMURAN) 50 MG tablet Take 50 mg by mouth 2 (two) times daily.  02/20/17  Yes [provider]  diazepam (VALIUM) 10 MG tablet Take 10 mg by mouth every 8 (eight) hours as needed for anxiety.    Yes [provider]  diphenhydrAMINE (BENADRYL) 25 MG tablet Take 25 mg by mouth at bedtime.   Yes [provider]  escitalopram (LEXAPRO) 10 MG tablet Take 30 mg by mouth daily.   Yes [provider]  folic acid (FOLVITE) 1 MG tablet Take 1 mg by mouth daily.   Yes [provider]  hydroxychloroquine (PLAQUENIL) 200 MG tablet Take 200 mg by mouth daily.  02/27/17  Yes [provider]  levothyroxine (SYNTHROID, LEVOTHROID) 50 MCG tablet Take 50 mcg by mouth daily before breakfast.   Yes [provider]  losartan (COZAAR) 100 MG tablet Take 100 mg by mouth daily.  02/26/17  Yes [provider]  metoprolol succinate (TOPROL-XL) 50 MG 24 hr tablet Take 50 mg by mouth daily. Take with or immediately following a meal.   Yes [provider]  omeprazole (PRILOSEC) 20 MG capsule Take 20 mg by mouth daily.   Yes [provider]  predniSONE (DELTASONE) 10 MG tablet Take 10 mg by mouth daily with breakfast.   Yes [provider]  QUEtiapine (SEROQUEL) 400 MG tablet Take 400 mg by mouth at bedtime.   Yes [provider]  sodium bicarbonate 650 MG tablet Take 1,300 mg by mouth 2 (two) times daily.   Yes [provider]  spironolactone (ALDACTONE) 25 MG tablet Take 25 mg by mouth every other day.   Yes [provider]  vitamin B-12 (CYANOCOBALAMIN) 1000 MCG tablet Take 1,000 mcg by mouth daily.   Yes [provider]  Aspirin-Acetaminophen-Caffeine (GOODY HEADACHE PO) Take 1 packet by mouth daily as needed (for headaches or pain).    [provider]  ranitidine (ZANTAC 75) 75 MG tablet Take 75 mg by mouth 2 (two) times daily as needed for indigestion.    [provider]    Family History Family History  Problem Relation Age of Onset  . Diabetes Mother   . Hypertension Sister   . Diabetes Maternal Grandmother   . Autoimmune disease Neg Hx     Social History Social History   Tobacco Use  . Smoking status: Current Every Day Smoker    Packs/day: 0.50    Years: 35.00    Pack years: 17.50    Types: Cigarettes    Last attempt to quit: 08/04/2016    Years since quitting: 1.5  . Smokeless tobacco: Never Used  . Tobacco comment: Continued cessation encouraged  Substance Use Topics  . Alcohol use: Yes    Comment: 1 drink every 3-4 months.  . Drug use: No     Allergies     Patient has no known allergies.   Review of Systems Review of Systems  All other systems reviewed and are negative.    Physical Exam Updated Vital Signs BP 121/78 (BP Location: Left Arm)   Pulse 87   Temp 98 F (36.7 C)   Resp (!) 21   LMP 07/07/2002   SpO2 95%   Physical Exam  Constitutional: She is oriented to person, place, and time. She appears well-developed and well-nourished.  Non-toxic appearance. No distress.  HENT:  Head: Normocephalic and atraumatic.  Eyes: Pupils are equal, round, and reactive to light. Conjunctivae, EOM and lids are normal.  Neck: Normal range of motion. Neck supple. No tracheal deviation present. No thyroid mass present.  Cardiovascular: Normal rate, regular rhythm and normal heart sounds. Exam reveals no gallop.  No murmur heard. Pulmonary/Chest: Effort normal and breath sounds normal. No stridor. No respiratory distress. She has no decreased breath sounds. She has no wheezes. She has no rhonchi. She has no rales.  Abdominal: Soft. Normal appearance and bowel sounds are normal. She exhibits distension. There is generalized tenderness. There is no rigidity, no rebound, no guarding and no CVA tenderness.  Musculoskeletal: Normal range of motion. She exhibits no edema or tenderness.  Neurological: She is alert and oriented to person, place, and time. She has normal strength. No cranial nerve deficit or sensory deficit. GCS eye subscore is 4. GCS verbal subscore is 5. GCS motor subscore is 6.  Skin: Skin is warm and dry. No abrasion and no rash noted.  Psychiatric: She has a normal mood and affect. Her speech is normal and behavior is normal.  Nursing note and vitals reviewed.    ED Treatments / Results  Labs (all labs ordered are listed, but only abnormal results are displayed) Labs Reviewed  CBC WITH DIFFERENTIAL/PLATELET  COMPREHENSIVE METABOLIC PANEL  LIPASE, BLOOD    EKG None  Radiology No results found.  Procedures Procedures  (including critical care time)  Medications Ordered in ED Medications  sodium chloride 0.9 % bolus 1,000 mL (has no administration in time range)  0.9 %  sodium chloride infusion (has no administration in time range)  ondansetron (ZOFRAN) injection 4 mg (has no administration in time range)     Initial Impression / Assessment and Plan / ED Course  I have reviewed the triage vital signs and the nursing notes.  Pertinent labs & imaging results that were available during my care of the patient were reviewed by me and considered in my medical decision making (see chart for details).     Patient was orthostatic while in her doctor's office today.  Given IV fluid hydration here.  Creatinine is up to 2.  Had abdominal distention and CT is consistent with constipation.  States that she continues to have emesis.  Will consult hospitalist for admission  Final Clinical Impressions(s) / ED Diagnoses   Final diagnoses:  None    ED Discharge Orders    None       Lacretia Leigh, MD 02/12/18 1527

## 2018-02-12 NOTE — Progress Notes (Signed)
Milk and molasses enema given with very small amount of hard stool returned. Eulas Post, RN

## 2018-02-12 NOTE — ED Notes (Signed)
Pt is alert and oriented  X 4 and is verbally responsive. Pt denies pain at this time and reports that she has some dizziness.

## 2018-02-13 DIAGNOSIS — K5900 Constipation, unspecified: Secondary | ICD-10-CM | POA: Diagnosis not present

## 2018-02-13 DIAGNOSIS — N179 Acute kidney failure, unspecified: Secondary | ICD-10-CM

## 2018-02-13 LAB — BASIC METABOLIC PANEL
ANION GAP: 8 (ref 5–15)
BUN: 22 mg/dL — AB (ref 6–20)
CALCIUM: 8.3 mg/dL — AB (ref 8.9–10.3)
CO2: 19 mmol/L — AB (ref 22–32)
Chloride: 116 mmol/L — ABNORMAL HIGH (ref 98–111)
Creatinine, Ser: 1.52 mg/dL — ABNORMAL HIGH (ref 0.44–1.00)
GFR calc Af Amer: 43 mL/min — ABNORMAL LOW (ref >60)
GFR calc non Af Amer: 37 mL/min — ABNORMAL LOW (ref >60)
GLUCOSE: 84 mg/dL (ref 70–99)
Potassium: 4.3 mmol/L (ref 3.5–5.1)
Sodium: 143 mmol/L (ref 135–145)

## 2018-02-13 LAB — CBC
HEMATOCRIT: 28 % — AB (ref 36.0–46.0)
Hemoglobin: 9.7 g/dL — ABNORMAL LOW (ref 12.0–15.0)
MCH: 40.8 pg — AB (ref 26.0–34.0)
MCHC: 34.6 g/dL (ref 30.0–36.0)
MCV: 117.6 fL — ABNORMAL HIGH (ref 78.0–100.0)
Platelets: 337 10*3/uL (ref 150–400)
RBC: 2.38 MIL/uL — ABNORMAL LOW (ref 3.87–5.11)
RDW: 16.2 % — AB (ref 11.5–15.5)
WBC: 4.1 10*3/uL (ref 4.0–10.5)

## 2018-02-13 MED ORDER — SENNOSIDES-DOCUSATE SODIUM 8.6-50 MG PO TABS: 1.0000 | ORAL_TABLET | Freq: Two times a day (BID) | ORAL | 0 refills | Status: AC

## 2018-02-13 MED ORDER — SENNOSIDES-DOCUSATE SODIUM 8.6-50 MG PO TABS
1.0000 | ORAL_TABLET | Freq: Two times a day (BID) | ORAL | Status: DC
  Administered 2018-02-13: 1 via ORAL
  Filled 2018-02-13: qty 1

## 2018-02-13 MED ORDER — PSYLLIUM 58.6 % PO POWD: 1.0000 | Freq: Three times a day (TID) | ORAL | Status: DC

## 2018-02-13 MED ORDER — ONDANSETRON HCL 4 MG PO TABS: 4.0000 mg | ORAL_TABLET | Freq: Four times a day (QID) | ORAL | 0 refills | Status: AC | PRN

## 2018-02-13 MED ORDER — LOSARTAN POTASSIUM 100 MG PO TABS: 100.0000 mg | ORAL_TABLET | Freq: Every day | ORAL | 0 refills | Status: DC

## 2018-02-13 MED ORDER — POLYETHYLENE GLYCOL 3350 17 G PO PACK: 17.0000 g | PACK | Freq: Two times a day (BID) | ORAL | 0 refills | Status: AC

## 2018-02-13 NOTE — Care Management Note (Signed)
Case Management Note  Patient Details  Name: Anna [name] MRN: [id number] Date of Birth: [birth year]  Subjective/Objective:   SLE, depression, GERD, AKI, CHF                 Action/Plan: Contacted [facility] to make aware of dc home today with resumption of care.   Expected Discharge Date:  02/13/18               Expected Discharge Plan:  [place]  In-House Referral:  NA  Discharge planning Services  CM Consult  Post Acute Care Choice:  Home Health, Resumption of Svcs/PTA Provider Choice offered to:  Patient  DME Arranged:  N/A DME Agency:  NA  HH Arranged:  RN [place] Agency:  [facility]  Status of Service:  Completed, signed off  If discussed at [employer], dates discussed:    Additional Comments:  Erenest Rasher, RN 02/13/2018, 11:41 AM

## 2018-02-13 NOTE — Discharge Instructions (Addendum)

## 2018-02-13 NOTE — Discharge Summary (Signed)
Physician Discharge Summary  MC HOLLEN GMW:[id number] DOB: [birth year] DOA: 02/12/2018  PCP: [name], MD  Admit date: 02/12/2018 Discharge date: 02/13/2018  Admitted From: Home Disposition: Home  Recommendations for Outpatient Follow-up:  1. Follow up with PCP in 1 week 2. Increased regimen for constipation 3. Discontinued amlodipine in setting of orthostatic hypotension 4. Please follow up on the following pending results: None  Home Health: None Equipment/Devices: None  Discharge Condition: Stable CODE STATUS: Full code Diet recommendation: High fiber   Brief/Interim Summary:  Admission HPI written by Sharene Butters, PA-C   HPI: Anna [name] is a 57 y.o. female with a history of CHF, hypertension, hypothyroidism, history of lupus on Imuran and chronic prednisone, and a recent presentation to the emergency department with intermittent nausea and vomiting for 2 weeks, minimal discomfort, felt to be related to constipation without bowel obstruction.  At that time, she had an enema with significant relief of her symptoms.  She was discharged in stable condition.  Over the last 2 weeks, the patient had intermittent episodes of constipation once again, and over the last 24 hours, she developed progressive nausea, nonbloody emesis without diarrhea.  Her constipation has been worse, and she states that has not had a bowel movement for at least 3 or 4 days, and is harder for her to produce feces.  She has been taking Gatorade, and some antiemetics as an outpatient.  She denies any sick contacts, or food poisoning.  She has no obvious appetite due to symptoms.  She denies any tobacco, alcohol or recreational drug use. she denies any fever, chills or night sweats.  She denies any chest pain or shortness of breath.  She has been feeling very weak, at times dizzy when she "gets up too quickly."  Remote history of seizures, she is now off of medication, due to absence of them for  many  years.  She denies any syncopal episode, presyncope, but she is dizzy at the time of admission although she really denies any vertigo, or vision changes.  She has chronic headaches, but not recently.   ED Course:  BP (!) 135/101   Pulse 93   Temp 98 F (36.7 C)   Resp 18   Ht 5\' 6"  (1.676 m)   Wt 78 kg   LMP 07/07/2002   SpO2 95%   BMI 27.75 kg/m   She was noted to be orthostatic, with BP 104/68 pulse 90 to lay down, sitting 105/70 over 92, and standing 83/63 pulse 100 She was given 1 L of IV fluids, and now is being maintained at around 100 cc an hour, with good response. The patient continues to be nauseous, and has some nausea, although the antiemetics improve her symptoms. CT of the abdomen and pelvis is negative for bowel obstruction, and does show large stool burden reflecting constipation.  No inflammatory issues For her constipation, the patient is to receive an enema today hopefully with resolution WBC is normal. Creatinine 2.04 BUN 30 calcium 8.6, hemoglobin 10.3   Hospital course:  Orthostatic hypotension Resolved. Held antihypertensives. In setting of decreased oral intake and emesis. Discontinued amlodipine.  Anemia Baseline hemoglobin of 9. Stable.  Acute kidney injury on CKD 3 In setting of dehydration. Improved.  Hyperlipidemia Continued Lipitor  Lupus Follows with rheumatology. Continue Imuran, aspirin  Hypothyroidism Continued Synthroid  GERD Continued   Depression Continued Lexapro   Discharge Diagnoses:  Principal Problem:   Constipation in female Active Problems:   SLE (  systemic lupus erythematosus) (HCC)   Depression   Hyperlipidemia   Hypothyroid   Essential hypertension, benign   Multinodular goiter   GERD (gastroesophageal reflux disease)   AKI (acute kidney injury) (HCC)   Congestive heart failure (CHF) (HCC)   Anemia   Orthostatic hypotension   History of TIA (transient ischemic attack)   Intractable nausea and  vomiting    Discharge Instructions  Discharge Instructions    Diet - low sodium heart healthy   Complete by:  As directed    Increase activity slowly   Complete by:  As directed      Allergies as of 02/13/2018   No Known Allergies     Medication List    STOP taking these medications   amLODipine 10 MG tablet Commonly known as:  NORVASC   GOODY HEADACHE PO     TAKE these medications   aspirin 500 MG tablet Take 500 mg by mouth 2 (two) times daily.   atorvastatin 20 MG tablet Commonly known as:  LIPITOR Take 20 mg by mouth daily.   azaTHIOprine 50 MG tablet Commonly known as:  IMURAN Take 50 mg by mouth 2 (two) times daily.   diazepam 10 MG tablet Commonly known as:  VALIUM Take 10 mg by mouth every 8 (eight) hours as needed for anxiety.   diphenhydrAMINE 25 MG tablet Commonly known as:  BENADRYL Take 25 mg by mouth at bedtime.   escitalopram 10 MG tablet Commonly known as:  LEXAPRO Take 30 mg by mouth daily.   folic acid 1 MG tablet Commonly known as:  FOLVITE Take 1 mg by mouth daily.   hydroxychloroquine 200 MG tablet Commonly known as:  PLAQUENIL Take 200 mg by mouth daily.   levothyroxine 50 MCG tablet Commonly known as:  SYNTHROID, LEVOTHROID Take 50 mcg by mouth daily before breakfast.   losartan 100 MG tablet Commonly known as:  COZAAR Take 1 tablet (100 mg total) by mouth daily. Start taking on:  02/15/2018 What changed:  These instructions start on 02/15/2018. If you are unsure what to do until then, ask your doctor or other care provider.   metoprolol succinate 50 MG 24 hr tablet Commonly known as:  TOPROL-XL Take 50 mg by mouth daily. Take with or immediately following a meal.   omeprazole 20 MG capsule Commonly known as:  PRILOSEC Take 20 mg by mouth daily.   ondansetron 4 MG tablet Commonly known as:  ZOFRAN Take 1 tablet (4 mg total) by mouth every 6 (six) hours as needed for nausea.   polyethylene glycol packet Commonly known  as:  MIRALAX / GLYCOLAX Take 17 g by mouth 2 (two) times daily.   predniSONE 10 MG tablet Commonly known as:  DELTASONE Take 10 mg by mouth daily with breakfast.   psyllium 58.6 % powder Commonly known as:  METAMUCIL Take 1 packet by mouth 3 (three) times daily.   QUEtiapine 400 MG tablet Commonly known as:  SEROQUEL Take 400 mg by mouth at bedtime.   senna-docusate 8.6-50 MG tablet Commonly known as:  Senokot-S Take 1 tablet by mouth 2 (two) times daily for 10 days.   sodium bicarbonate 650 MG tablet Take 1,300 mg by mouth 2 (two) times daily.   spironolactone 25 MG tablet Commonly known as:  ALDACTONE Take 25 mg by mouth every other day.   vitamin B-12 1000 MCG tablet Commonly known as:  CYANOCOBALAMIN Take 1,000 mcg by mouth daily.   ZANTAC 75 75 MG tablet Generic  drug:  ranitidine Take 75 mg by mouth 2 (two) times daily as needed for indigestion.      Follow-up Information    [name], MD. Schedule an appointment as soon as possible for a visit in 1 week(s).   Specialty:  Family Medicine Contact information: [address] [place] [address] [phone]          No Known Allergies  Consultations:  None   Procedures/Studies: Ct Abdomen Pelvis Wo Contrast  Result Date: 02/12/2018 CLINICAL DATA:  57 year old with nausea and vomiting for 3 weeks. Hypotensive. EXAM: CT ABDOMEN AND PELVIS WITHOUT CONTRAST TECHNIQUE: Multidetector CT imaging of the abdomen and pelvis was performed following the standard protocol without IV contrast. COMPARISON:  02/02/2018 FINDINGS: Lower chest: Again noted are small bulla or cystic changes in the right lower lobe. Motion artifact at the lung bases. Stable scarring in the left lower lobe. Hepatobiliary: Limited evaluation on this non contrast examination. Configuration the gallbladder is grossly unchanged from the recent comparison examination. No significant biliary dilatation. Pancreas: Unremarkable. No  pancreatic ductal dilatation or surrounding inflammatory changes. Spleen: Normal in size without focal abnormality. Adrenals/Urinary Tract: Normal adrenal glands. Urinary bladder is mildly distended. Normal appearance of both kidneys without hydronephrosis or stones. Again noted are low-density cysts in the right kidney. Stomach/Bowel: Oral contrast in the stomach and small bowel. Again noted is a very large amount of stool throughout the colon without colonic distention. Cecum is located near the mid abdomen. Transverse colon extends all the way down to the lower pelvis and contains a large amount of stool. Rectum is decompressed with minimal stool. No evidence for bowel inflammation. Vascular/Lymphatic: Atherosclerotic calcifications in the aorta and iliac arteries without aneurysm. No significant lymph node enlargement in the abdomen or pelvis. Reproductive: Uterus has been removed. No evidence for an adnexal mass. Other: Negative for ascites. Negative for free air. Stable small umbilical hernia containing fat. Evidence for tiny adjacent periumbilical hernias. Musculoskeletal: Stable disc space narrowing and disease at L4-L5. IMPRESSION: Large colonic stool burden, particularly involving the right colon and transverse colon. Findings are similar to the recent comparison examination. Findings are compatible with constipation. No evidence for bowel obstruction or inflammation. Small ventral hernias. Electronically Signed   By: Markus Daft M.D.   On: 02/12/2018 15:08   Dg Chest 2 View  Result Date: 02/02/2018 CLINICAL DATA:  Chest pain EXAM: CHEST - 2 VIEW COMPARISON:  10/23/2016 FINDINGS: No acute consolidation or effusion. Borderline to mild cardiomegaly. No pneumothorax. IMPRESSION: No active cardiopulmonary disease.  Borderline cardiomegaly Electronically Signed   By: Donavan Foil M.D.   On: 02/02/2018 22:40   Ct Abdomen Pelvis W Contrast  Result Date: 02/03/2018 CLINICAL DATA:  Abdominal pain,  constipation, vomiting EXAM: CT ABDOMEN AND PELVIS WITH CONTRAST TECHNIQUE: Multidetector CT imaging of the abdomen and pelvis was performed using the standard protocol following bolus administration of intravenous contrast. CONTRAST:  127mL OMNIPAQUE IOHEXOL 300 MG/ML  SOLN COMPARISON:  None. FINDINGS: Lower chest: Mild subpleural reticulation in the left lower lobe. Hepatobiliary: Liver is within normal limits. Gallbladder is unremarkable. No intrahepatic or extrahepatic ductal dilatation. Pancreas: Within normal limits. Spleen: Within normal limits. Adrenals/Urinary Tract: Adrenal glands are within normal limits. Left kidney is within normal limits. Multiple right renal cysts measuring up to 17 mm in the right lower pole. No hydronephrosis. Bladder is within normal limits. Stomach/Bowel: Stomach is within normal limits. No evidence of bowel obstruction. Appendix is not discretely visualized. Large volume  right colonic stool burden, extending from the cecum/ascending colon to the distal transverse colon, reflecting constipation. Vascular/Lymphatic: No evidence of abdominal aortic aneurysm. Atherosclerotic calcifications of the abdominal aorta and branch vessels. No suspicious abdominopelvic lymphadenopathy. Reproductive: Status post hysterectomy. No adnexal masses. Other: No abdominopelvic ascites. Tiny fat containing periumbilical hernia. Musculoskeletal: Mild degenerative changes at L4-5. IMPRESSION: Large volume right colonic stool burden, reflecting constipation. No evidence of bowel obstruction. Additional ancillary findings as above. Electronically Signed   By: Julian Hy M.D.   On: 02/03/2018 00:42     Subjective: Small bowel movement overnight.  Discharge Exam: Vitals:   02/12/18 2119 02/13/18 0455  BP: (!) 142/85 112/72  Pulse: 97 89  Resp: (!) 26 17  Temp: 98.6 F (37 C) 98.1 F (36.7 C)  SpO2: 97% 92%   Vitals:   02/12/18 1835 02/12/18 1836 02/12/18 2119 02/13/18 0455  BP: (!)  132/92 129/86 (!) 142/85 112/72  Pulse: 93 (!) 102 97 89  Resp: 16 18 (!) 26 17  Temp: 98.3 F (36.8 C)  98.6 F (37 C) 98.1 F (36.7 C)  TempSrc: Oral  Oral Oral  SpO2: 94% 94% 97% 92%  Weight:    77.6 kg  Height:        General: Pt is alert, awake, not in acute distress Cardiovascular: RRR, S1/S2 +, no rubs, no gallops Respiratory: CTA bilaterally, no wheezing, no rhonchi Abdominal: Soft, NT, distended, bowel sounds + Extremities: no edema, no cyanosis    The results of significant diagnostics from this hospitalization (including imaging, microbiology, ancillary and laboratory) are listed below for reference.     Microbiology: No results found for this or any previous visit (from the past 240 hour(s)).   Labs: Basic Metabolic Panel: Recent Labs  Lab 02/12/18 1213 02/13/18 0450  NA 140 143  K 4.6 4.3  CL 110 116*  CO2 20* 19*  GLUCOSE 108* 84  BUN 30* 22*  CREATININE 2.04* 1.52*  CALCIUM 8.6* 8.3*   Liver Function Tests: Recent Labs  Lab 02/12/18 1213  AST 21  ALT 15  ALKPHOS 74  BILITOT 0.3  PROT 7.4  ALBUMIN 3.8   Recent Labs  Lab 02/12/18 1213  LIPASE 42   CBC: Recent Labs  Lab 02/12/18 1213 02/13/18 0450  WBC 4.5 4.1  NEUTROABS 4.0  --   HGB 10.3* 9.7*  HCT 29.8* 28.0*  MCV 115.5* 117.6*  PLT 371 337   Urinalysis    Component Value Date/Time   COLORURINE STRAW (A) 02/12/2018 1730   APPEARANCEUR CLEAR 02/12/2018 1730   LABSPEC 1.005 02/12/2018 1730   PHURINE 5.0 02/12/2018 1730   GLUCOSEU NEGATIVE 02/12/2018 1730   HGBUR NEGATIVE 02/12/2018 1730   BILIRUBINUR NEGATIVE 02/12/2018 1730   BILIRUBINUR n 07/20/2013 0948   KETONESUR NEGATIVE 02/12/2018 1730   PROTEINUR NEGATIVE 02/12/2018 1730   UROBILINOGEN 0.2 05/11/2015 2201   NITRITE NEGATIVE 02/12/2018 1730   LEUKOCYTESUR NEGATIVE 02/12/2018 1730     SIGNED:   Cordelia Poche, MD [facility] 02/13/2018, 11:12 AM

## 2018-06-15 ENCOUNTER — Emergency Department (HOSPITAL_COMMUNITY): Payer: BLUE CROSS/BLUE SHIELD

## 2018-06-15 ENCOUNTER — Observation Stay (HOSPITAL_COMMUNITY)
Admission: EM | Admit: 2018-06-15 | Discharge: 2018-06-18 | Disposition: A | Discharge status: Home / Self Care | Payer: BLUE CROSS/BLUE SHIELD | Attending: Internal Medicine | Admitting: Internal Medicine

## 2018-06-15 ENCOUNTER — Encounter (HOSPITAL_COMMUNITY): Payer: Self-pay | Admitting: Internal Medicine

## 2018-06-15 DIAGNOSIS — R778 Other specified abnormalities of plasma proteins: Secondary | ICD-10-CM

## 2018-06-15 DIAGNOSIS — R29810 Facial weakness: Secondary | ICD-10-CM | POA: Diagnosis not present

## 2018-06-15 DIAGNOSIS — K219 Gastro-esophageal reflux disease without esophagitis: Secondary | ICD-10-CM | POA: Diagnosis not present

## 2018-06-15 DIAGNOSIS — I130 Hypertensive heart and chronic kidney disease with heart failure and stage 1 through stage 4 chronic kidney disease, or unspecified chronic kidney disease: Secondary | ICD-10-CM | POA: Diagnosis not present

## 2018-06-15 DIAGNOSIS — G9340 Encephalopathy, unspecified: Secondary | ICD-10-CM | POA: Diagnosis not present

## 2018-06-15 DIAGNOSIS — R471 Dysarthria and anarthria: Secondary | ICD-10-CM

## 2018-06-15 DIAGNOSIS — N2589 Other disorders resulting from impaired renal tubular function: Secondary | ICD-10-CM | POA: Diagnosis present

## 2018-06-15 DIAGNOSIS — Z7982 Long term (current) use of aspirin: Secondary | ICD-10-CM | POA: Diagnosis not present

## 2018-06-15 DIAGNOSIS — Z79899 Other long term (current) drug therapy: Secondary | ICD-10-CM | POA: Insufficient documentation

## 2018-06-15 DIAGNOSIS — F17210 Nicotine dependence, cigarettes, uncomplicated: Secondary | ICD-10-CM | POA: Insufficient documentation

## 2018-06-15 DIAGNOSIS — E7800 Pure hypercholesterolemia, unspecified: Secondary | ICD-10-CM | POA: Diagnosis not present

## 2018-06-15 DIAGNOSIS — Z7952 Long term (current) use of systemic steroids: Secondary | ICD-10-CM | POA: Diagnosis not present

## 2018-06-15 DIAGNOSIS — Z79890 Hormone replacement therapy (postmenopausal): Secondary | ICD-10-CM | POA: Insufficient documentation

## 2018-06-15 DIAGNOSIS — R4182 Altered mental status, unspecified: Secondary | ICD-10-CM | POA: Diagnosis present

## 2018-06-15 DIAGNOSIS — F329 Major depressive disorder, single episode, unspecified: Secondary | ICD-10-CM | POA: Insufficient documentation

## 2018-06-15 DIAGNOSIS — N183 Chronic kidney disease, stage 3 unspecified: Secondary | ICD-10-CM

## 2018-06-15 DIAGNOSIS — N179 Acute kidney failure, unspecified: Secondary | ICD-10-CM | POA: Insufficient documentation

## 2018-06-15 DIAGNOSIS — Z8673 Personal history of transient ischemic attack (TIA), and cerebral infarction without residual deficits: Secondary | ICD-10-CM | POA: Diagnosis not present

## 2018-06-15 DIAGNOSIS — J432 Centrilobular emphysema: Secondary | ICD-10-CM | POA: Insufficient documentation

## 2018-06-15 DIAGNOSIS — M3213 Lung involvement in systemic lupus erythematosus: Secondary | ICD-10-CM

## 2018-06-15 DIAGNOSIS — K5900 Constipation, unspecified: Secondary | ICD-10-CM | POA: Insufficient documentation

## 2018-06-15 DIAGNOSIS — R748 Abnormal levels of other serum enzymes: Secondary | ICD-10-CM | POA: Diagnosis not present

## 2018-06-15 DIAGNOSIS — R7989 Other specified abnormal findings of blood chemistry: Secondary | ICD-10-CM

## 2018-06-15 DIAGNOSIS — R0902 Hypoxemia: Secondary | ICD-10-CM

## 2018-06-15 DIAGNOSIS — I313 Pericardial effusion (noninflammatory): Secondary | ICD-10-CM | POA: Insufficient documentation

## 2018-06-15 DIAGNOSIS — J9601 Acute respiratory failure with hypoxia: Secondary | ICD-10-CM | POA: Diagnosis not present

## 2018-06-15 DIAGNOSIS — E785 Hyperlipidemia, unspecified: Secondary | ICD-10-CM | POA: Diagnosis not present

## 2018-06-15 DIAGNOSIS — I5031 Acute diastolic (congestive) heart failure: Secondary | ICD-10-CM | POA: Insufficient documentation

## 2018-06-15 DIAGNOSIS — I10 Essential (primary) hypertension: Secondary | ICD-10-CM | POA: Diagnosis present

## 2018-06-15 DIAGNOSIS — E039 Hypothyroidism, unspecified: Secondary | ICD-10-CM | POA: Insufficient documentation

## 2018-06-15 DIAGNOSIS — E872 Acidosis: Secondary | ICD-10-CM | POA: Diagnosis not present

## 2018-06-15 DIAGNOSIS — R410 Disorientation, unspecified: Secondary | ICD-10-CM

## 2018-06-15 DIAGNOSIS — R531 Weakness: Secondary | ICD-10-CM | POA: Diagnosis present

## 2018-06-15 DIAGNOSIS — M329 Systemic lupus erythematosus, unspecified: Secondary | ICD-10-CM | POA: Diagnosis not present

## 2018-06-15 LAB — URINALYSIS, ROUTINE W REFLEX MICROSCOPIC
Bilirubin Urine: NEGATIVE
Glucose, UA: NEGATIVE mg/dL
Hgb urine dipstick: NEGATIVE
Ketones, ur: NEGATIVE mg/dL
Leukocytes, UA: NEGATIVE
NITRITE: NEGATIVE
Protein, ur: 30 mg/dL — AB
Specific Gravity, Urine: 1.024 (ref 1.005–1.030)
pH: 5 (ref 5.0–8.0)

## 2018-06-15 LAB — CBC
HCT: 27.9 % — ABNORMAL LOW (ref 36.0–46.0)
Hemoglobin: 10.1 g/dL — ABNORMAL LOW (ref 12.0–15.0)
MCH: 39.1 pg — ABNORMAL HIGH (ref 26.0–34.0)
MCHC: 36.2 g/dL — ABNORMAL HIGH (ref 30.0–36.0)
MCV: 108.1 fL — AB (ref 80.0–100.0)
Platelets: 221 10*3/uL (ref 150–400)
RBC: 2.58 MIL/uL — ABNORMAL LOW (ref 3.87–5.11)
RDW: 18.4 % — ABNORMAL HIGH (ref 11.5–15.5)
WBC: 4.7 10*3/uL (ref 4.0–10.5)
nRBC: 0 % (ref 0.0–0.2)

## 2018-06-15 LAB — COMPREHENSIVE METABOLIC PANEL
ALT: 18 U/L (ref 0–44)
AST: 41 U/L (ref 15–41)
Albumin: 3.7 g/dL (ref 3.5–5.0)
Alkaline Phosphatase: 77 U/L (ref 38–126)
Anion gap: 13 (ref 5–15)
BUN: 29 mg/dL — AB (ref 6–20)
CO2: 15 mmol/L — ABNORMAL LOW (ref 22–32)
Calcium: 8.8 mg/dL — ABNORMAL LOW (ref 8.9–10.3)
Chloride: 115 mmol/L — ABNORMAL HIGH (ref 98–111)
Creatinine, Ser: 2.19 mg/dL — ABNORMAL HIGH (ref 0.44–1.00)
GFR calc Af Amer: 28 mL/min — ABNORMAL LOW (ref >60)
GFR calc non Af Amer: 24 mL/min — ABNORMAL LOW (ref >60)
GLUCOSE: 94 mg/dL (ref 70–99)
Potassium: 4.6 mmol/L (ref 3.5–5.1)
SODIUM: 143 mmol/L (ref 135–145)
Total Bilirubin: 0.5 mg/dL (ref 0.3–1.2)
Total Protein: 7.4 g/dL (ref 6.5–8.1)

## 2018-06-15 LAB — RAPID URINE DRUG SCREEN, HOSP PERFORMED
Amphetamines: NOT DETECTED
Barbiturates: NOT DETECTED
Benzodiazepines: POSITIVE — AB
Cocaine: NOT DETECTED
Opiates: NOT DETECTED
TETRAHYDROCANNABINOL: NOT DETECTED

## 2018-06-15 LAB — I-STAT BETA HCG BLOOD, ED (MC, WL, AP ONLY): I-stat hCG, quantitative: 9 m[IU]/mL — ABNORMAL HIGH (ref <5)

## 2018-06-15 LAB — I-STAT CG4 LACTIC ACID, ED
Lactic Acid, Venous: 1.61 mmol/L (ref 0.5–1.9)
Lactic Acid, Venous: 0.45 mmol/L — ABNORMAL LOW (ref 0.5–1.9)

## 2018-06-15 LAB — SEDIMENTATION RATE: Sed Rate: 111 mm/hr — ABNORMAL HIGH (ref 0–22)

## 2018-06-15 LAB — TROPONIN I: Troponin I: 0.03 ng/mL (ref <0.03)

## 2018-06-15 LAB — CBG MONITORING, ED: GLUCOSE-CAPILLARY: 95 mg/dL (ref 70–99)

## 2018-06-15 LAB — BRAIN NATRIURETIC PEPTIDE: B Natriuretic Peptide: 45.6 pg/mL (ref 0.0–100.0)

## 2018-06-15 LAB — ETHANOL: Alcohol, Ethyl (B): <10 mg/dL (ref <10)

## 2018-06-15 LAB — C-REACTIVE PROTEIN: CRP: 12.3 mg/dL — ABNORMAL HIGH (ref <1.0)

## 2018-06-15 LAB — AMMONIA: Ammonia: 17 umol/L (ref 9–35)

## 2018-06-15 MED ORDER — ATORVASTATIN CALCIUM 10 MG PO TABS
20.0000 mg | ORAL_TABLET | Freq: Every day | ORAL | Status: DC
  Administered 2018-06-16 – 2018-06-18 (×3): 20 mg via ORAL
  Filled 2018-06-15 (×3): qty 2

## 2018-06-15 MED ORDER — AMLODIPINE BESYLATE 10 MG PO TABS
10.0000 mg | ORAL_TABLET | Freq: Every day | ORAL | Status: DC
  Administered 2018-06-16 – 2018-06-18 (×3): 10 mg via ORAL
  Filled 2018-06-15 (×2): qty 1
  Filled 2018-06-15: qty 2

## 2018-06-15 MED ORDER — SODIUM BICARBONATE 650 MG PO TABS: 1300.0000 mg | ORAL_TABLET | Freq: Two times a day (BID) | ORAL | Status: DC

## 2018-06-15 MED ORDER — PREDNISONE 10 MG PO TABS
10.0000 mg | ORAL_TABLET | Freq: Every day | ORAL | Status: DC
  Administered 2018-06-16 – 2018-06-18 (×3): 10 mg via ORAL
  Filled 2018-06-15 (×3): qty 1

## 2018-06-15 MED ORDER — QUETIAPINE FUMARATE 400 MG PO TABS
400.0000 mg | ORAL_TABLET | Freq: Every day | ORAL | Status: DC
  Administered 2018-06-16 – 2018-06-17 (×3): 400 mg via ORAL
  Filled 2018-06-15 (×3): qty 1

## 2018-06-15 MED ORDER — METOPROLOL SUCCINATE ER 50 MG PO TB24
50.0000 mg | ORAL_TABLET | Freq: Every day | ORAL | Status: DC
  Administered 2018-06-16 – 2018-06-18 (×3): 50 mg via ORAL
  Filled 2018-06-15: qty 2
  Filled 2018-06-15 (×2): qty 1

## 2018-06-15 MED ORDER — ONDANSETRON HCL 4 MG PO TABS: 4.0000 mg | ORAL_TABLET | Freq: Four times a day (QID) | ORAL | Status: DC | PRN

## 2018-06-15 MED ORDER — AZATHIOPRINE 50 MG PO TABS
50.0000 mg | ORAL_TABLET | Freq: Two times a day (BID) | ORAL | Status: DC
  Administered 2018-06-16 – 2018-06-18 (×5): 50 mg via ORAL
  Filled 2018-06-15 (×6): qty 1

## 2018-06-15 MED ORDER — HYDROXYCHLOROQUINE SULFATE 200 MG PO TABS
200.0000 mg | ORAL_TABLET | Freq: Every day | ORAL | Status: DC
  Administered 2018-06-16 – 2018-06-18 (×3): 200 mg via ORAL
  Filled 2018-06-15 (×3): qty 1

## 2018-06-15 MED ORDER — PANTOPRAZOLE SODIUM 40 MG PO TBEC
40.0000 mg | DELAYED_RELEASE_TABLET | Freq: Every day | ORAL | Status: DC
  Administered 2018-06-16 – 2018-06-18 (×3): 40 mg via ORAL
  Filled 2018-06-15 (×3): qty 1

## 2018-06-15 MED ORDER — ESCITALOPRAM OXALATE 20 MG PO TABS
30.0000 mg | ORAL_TABLET | Freq: Every day | ORAL | Status: DC
  Administered 2018-06-16 – 2018-06-18 (×3): 30 mg via ORAL
  Filled 2018-06-15: qty 3
  Filled 2018-06-15 (×2): qty 1

## 2018-06-15 MED ORDER — CALCITRIOL 0.25 MCG PO CAPS
0.2500 ug | ORAL_CAPSULE | Freq: Every day | ORAL | Status: DC
  Administered 2018-06-16 – 2018-06-18 (×3): 0.25 ug via ORAL
  Filled 2018-06-15 (×3): qty 1

## 2018-06-15 MED ORDER — FOLIC ACID 1 MG PO TABS
1.0000 mg | ORAL_TABLET | Freq: Every day | ORAL | Status: DC
  Administered 2018-06-16 – 2018-06-18 (×4): 1 mg via ORAL
  Filled 2018-06-15 (×4): qty 1

## 2018-06-15 MED ORDER — VITAMIN B-12 1000 MCG PO TABS
1000.0000 ug | ORAL_TABLET | Freq: Every day | ORAL | Status: DC
  Administered 2018-06-16 – 2018-06-18 (×3): 1000 ug via ORAL
  Filled 2018-06-15 (×3): qty 1

## 2018-06-15 MED ORDER — SODIUM CHLORIDE 0.9 % IV SOLN
INTRAVENOUS | Status: DC
  Administered 2018-06-16 – 2018-06-17 (×5): via INTRAVENOUS

## 2018-06-15 MED ORDER — SODIUM BICARBONATE 650 MG PO TABS
1300.0000 mg | ORAL_TABLET | Freq: Three times a day (TID) | ORAL | Status: DC
  Administered 2018-06-16 – 2018-06-18 (×8): 1300 mg via ORAL
  Filled 2018-06-15 (×9): qty 2

## 2018-06-15 MED ORDER — LEVOTHYROXINE SODIUM 50 MCG PO TABS
50.0000 ug | ORAL_TABLET | Freq: Every day | ORAL | Status: DC
  Administered 2018-06-16 – 2018-06-18 (×3): 50 ug via ORAL
  Filled 2018-06-15 (×3): qty 1

## 2018-06-15 NOTE — ED Notes (Signed)
Ammonia sent with other labs.

## 2018-06-15 NOTE — ED Triage Notes (Addendum)
Pt here c/o generalized weakness that has gotten progressively worse over the last 3 weeks. Per family, patient has had cold symptoms for 1 week. Today, patient's husband found her on the floor at around 1500 after she slid down onto the floor. Not on blood thinners. Pt has right sided facial droop, is leaning to the right side. Pt ambulatory for EMS without difficulty.

## 2018-06-15 NOTE — ED Notes (Signed)
Pharmacy messaged about unverified meds 

## 2018-06-15 NOTE — ED Provider Notes (Signed)
[facility] EMERGENCY DEPARTMENT Provider Note   CSN: [id number] Arrival date & time: 06/15/18  1605     History   Chief Complaint Chief Complaint  Patient presents with  . Weakness    HPI Anna [name] is a 57 y.o. female with past medical history of TIA versus CVA, lupus, hypertension, CHF, presenting to the emergency department via EMS with altered mental status.  Patient sister is present, states she lacks took with her sister around 44 this morning.  States she sounded slightly confused with slurred speech.  Her husband was sent home to check on her, who found her hunched over the washing machine around 3:00 PM.  It is unknown how long patient was there.  Last known normal is also unknown.  Her sister states she spoke with her yesterday evening around 6:00pm and she seemed normal.  Her speech sounds slurred and she has been acting more confused than normal.  Her sister also e is xpresses concern for swelling to the right side of her face.  She reports she has been dealing with cold symptoms for about 1 week.  She is not on anticoagulation. The history is provided by a relative. The history is limited by the condition of the patient.     Past Medical History:  Diagnosis Date  . Acute CHF ([place]) 07/2016  . Depressive disorder, not elsewhere classified   . Family history of adverse reaction to anesthesia    mother passed away during surgery  . Hypertension   . Hypothyroid    Dr. Wilson Singer  . Kidney stone   . Lupus (systemic lupus erythematosus) ([place])   . Migraine   . Pure hypercholesterolemia   . Stroke [facility])     Patient Active Problem List   Diagnosis Date Noted  . Constipation in female 02/12/2018  . Orthostatic hypotension 02/12/2018  . History of TIA (transient ischemic attack) 02/12/2018  . Intractable nausea and vomiting 02/12/2018  . Pulmonary hypertension ([place]) 08/25/2016  . Pleural effusion   . Pulmonary edema   . SIRS (systemic inflammatory  response syndrome) ([facility])   . Accidental drug overdose   . Acute diastolic CHF (congestive heart failure) ([place])   . HCAP (healthcare-associated pneumonia)   . Long-term use of Plaquenil   . History of lupus ([place])   . Altered mental status 08/04/2016  . AKI (acute kidney injury) ([place]) 08/04/2016  . Congestive heart failure (CHF) ([place]) 08/04/2016  . Anemia 08/04/2016  . Acute respiratory failure with hypoxia ([place]) 08/04/2016  . Acute encephalopathy 08/04/2016  . Migraine without aura 12/09/2012  . Unspecified constipation 12/08/2012  . Unspecified hypothyroidism 12/08/2012  . GERD (gastroesophageal reflux disease) 12/08/2012  . Multinodular goiter 08/20/2011  . Essential hypertension, benign 12/25/2010  . SLE (systemic lupus erythematosus) ([place]) 11/27/2010  . Depression 11/27/2010  . Hyperlipidemia 11/27/2010  . Hypothyroid 11/27/2010  . Encounter for long-term (current) use of other medications 11/27/2010    Past Surgical History:  Procedure Laterality Date  . CESAREAN SECTION    . ECTOPIC PREGNANCY SURGERY     times 2  . JOINT REPLACEMENT    . LAPAROSCOPY     with rt salpingectomy  . [place] SURGERY  2003  . TONSILLECTOMY    . TOTAL ABDOMINAL HYSTERECTOMY  2004   ovaries retained, DUB  . TUBAL LIGATION  1985     OB History    Gravida  5   Para  1   Term  1  Preterm      AB  4   Living  1     SAB      TAB  2   Ectopic  2   Multiple      Live Births  1            Home Medications    Prior to Admission medications   Medication Sig Start Date End Date Taking? Authorizing Provider  aspirin 500 MG tablet Take 500 mg by mouth 2 (two) times daily.    [provider]  atorvastatin (LIPITOR) 20 MG tablet Take 20 mg by mouth daily.    [provider]  azaTHIOprine (IMURAN) 50 MG tablet Take 50 mg by mouth 2 (two) times daily.  02/20/17   [provider]  diazepam (VALIUM) 10 MG tablet Take 10 mg by mouth every 8 (eight)  hours as needed for anxiety.     [provider]  diphenhydrAMINE (BENADRYL) 25 MG tablet Take 25 mg by mouth at bedtime.    [provider]  escitalopram (LEXAPRO) 10 MG tablet Take 30 mg by mouth daily.    [provider]  folic acid (FOLVITE) 1 MG tablet Take 1 mg by mouth daily.    [provider]  hydroxychloroquine (PLAQUENIL) 200 MG tablet Take 200 mg by mouth daily.  02/27/17   [provider]  levothyroxine (SYNTHROID, LEVOTHROID) 50 MCG tablet Take 50 mcg by mouth daily before breakfast.    [provider]  losartan (COZAAR) 100 MG tablet Take 1 tablet (100 mg total) by mouth daily. 02/15/18   Mariel Aloe, MD  metoprolol succinate (TOPROL-XL) 50 MG 24 hr tablet Take 50 mg by mouth daily. Take with or immediately following a meal.    [provider]  omeprazole (PRILOSEC) 20 MG capsule Take 20 mg by mouth daily.    [provider]  ondansetron (ZOFRAN) 4 MG tablet Take 1 tablet (4 mg total) by mouth every 6 (six) hours as needed for nausea. 02/13/18   Mariel Aloe, MD  polyethylene glycol (MIRALAX / GLYCOLAX) packet Take 17 g by mouth 2 (two) times daily. 02/13/18   Mariel Aloe, MD  predniSONE (DELTASONE) 10 MG tablet Take 10 mg by mouth daily with breakfast.    [provider]  psyllium (METAMUCIL SMOOTH TEXTURE) 58.6 % powder Take 1 packet by mouth 3 (three) times daily. 02/13/18   Mariel Aloe, MD  QUEtiapine (SEROQUEL) 400 MG tablet Take 400 mg by mouth at bedtime.    [provider]  ranitidine (ZANTAC 75) 75 MG tablet Take 75 mg by mouth 2 (two) times daily as needed for indigestion.    [provider]  sodium bicarbonate 650 MG tablet Take 1,300 mg by mouth 2 (two) times daily.    [provider]  spironolactone (ALDACTONE) 25 MG tablet Take 25 mg by mouth every other day.    [provider]  vitamin B-12 (CYANOCOBALAMIN) 1000 MCG tablet Take 1,000 mcg by  mouth daily.    [provider]    Family History Family History  Problem Relation Age of Onset  . Diabetes Mother   . Hypertension Sister   . Diabetes Maternal Grandmother   . Autoimmune disease Neg Hx     Social History Social History   Tobacco Use  . Smoking status: Current Every Day Smoker    Packs/day: 0.50    Years: 35.00    Pack years: 17.50  Types: Cigarettes    Last attempt to quit: 08/04/2016    Years since quitting: 1.8  . Smokeless tobacco: Never Used  . Tobacco comment: Continued cessation encouraged  Substance Use Topics  . Alcohol use: Yes    Comment: 1 drink every 3-4 months.  . Drug use: No     Allergies   Patient has no known allergies.   Review of Systems Review of Systems  Unable to perform ROS: Mental status change     Physical Exam Updated Vital Signs BP (!) 146/97   Pulse 97   Temp 97.8 F (36.6 C) (Oral)   Resp (!) 24   LMP 07/07/2002   SpO2 95%   Physical Exam  Constitutional: She appears well-developed and well-nourished.  Patient appears sleepy though was easily arousable to verbal stimuli.  HENT:  Head: Normocephalic and atraumatic.  Mouth appears dry  Eyes: Conjunctivae are normal.  Neck: Normal range of motion. Neck supple.  Cardiovascular: Normal rate, regular rhythm, normal heart sounds and intact distal pulses.  Pulmonary/Chest: Effort normal. No respiratory distress.  Breath sounds are coarse bilaterally.  Abdominal: Soft. Bowel sounds are normal. There is no tenderness. There is no guarding.  Musculoskeletal: She exhibits no edema.  Neurological: She is alert.  Mental Status:  Alert, oriented to person, city, month, year, thought content somewhat confused and does not make much sense when asked to describe what happened today. Speech with dysarthria, however is able to identify my pen as a pen. Able to follow 2 step commands without difficulty.  Cranial Nerves:  II:  pupils equal, round, reactive to  light III,IV, VI: ptosis not present, extra-ocular motions intact bilaterally  V,VII: smile symmetric, facial light touch sensation equal VIII: did not assess X: uvula elevates symmetrically  XI: bilateral shoulder shrug symmetric and strong XII: midline tongue extension without fassiculations Motor:  Normal tone. 5/5 in upper and lower extremities bilaterally including strong and equal grip strength and dorsiflexion/plantar flexion Sensory: Pinprick and light touch normal in all extremities.  Deep Tendon Reflexes: 2+ and symmetric in the biceps and patella Cerebellar: normal finger-to-nose with bilateral upper extremities CV: distal pulses palpable throughout   Skin: Skin is warm.  Psychiatric: She has a normal mood and affect. Her behavior is normal.  Nursing note and vitals reviewed.    ED Treatments / Results  Labs (all labs ordered are listed, but only abnormal results are displayed) Labs Reviewed  COMPREHENSIVE METABOLIC PANEL - Abnormal; Notable for the following components:      Result Value   Chloride 115 (*)    CO2 15 (*)    BUN 29 (*)    Creatinine, Ser 2.19 (*)    Calcium 8.8 (*)    GFR calc non Af Amer 24 (*)    GFR calc Af Amer 28 (*)    All other components within normal limits  CBC - Abnormal; Notable for the following components:   RBC 2.58 (*)    Hemoglobin 10.1 (*)    HCT 27.9 (*)    MCV 108.1 (*)    MCH 39.1 (*)    MCHC 36.2 (*)    RDW 18.4 (*)    All other components within normal limits  C-REACTIVE PROTEIN - Abnormal; Notable for the following components:   CRP 12.3 (*)    All other components within normal limits  I-STAT BETA HCG BLOOD, ED (MC, [facility], AP ONLY) - Abnormal; Notable for the following components:   I-stat hCG, quantitative 9.0 (*)  All other components within normal limits  I-STAT CG4 LACTIC ACID, ED - Abnormal; Notable for the following components:   Lactic Acid, Venous 0.45 (*)    All other components within normal limits    ETHANOL  AMMONIA  BRAIN NATRIURETIC PEPTIDE  RAPID URINE DRUG SCREEN, HOSP PERFORMED  TROPONIN I  TROPONIN I  TROPONIN I  URINALYSIS, ROUTINE W REFLEX MICROSCOPIC  SEDIMENTATION RATE  CBG MONITORING, ED  CBG MONITORING, ED  I-STAT CG4 LACTIC ACID, ED    EKG EKG Interpretation  Date/Time:  Tuesday June 15 2018 16:39:09 EST Ventricular Rate:  99 PR Interval:    QRS Duration: 86 QT Interval:  333 QTC Calculation: 428 R Axis:   62 Text Interpretation:  Sinus rhythm Ventricular premature complex Aberrant conduction of SV complex(es) Probable left atrial enlargement Minimal ST depression, inferior leads since last tracing no significant change Confirmed by Malvin Johns [id number]) on 06/15/2018 8:36:07 PM   Radiology Ct Head Wo Contrast  Result Date: 06/15/2018 CLINICAL DATA:  Altered mental status.  Right-sided facial droop EXAM: CT HEAD WITHOUT CONTRAST TECHNIQUE: Contiguous axial images were obtained from the base of the skull through the vertex without intravenous contrast. COMPARISON:  Head CT August 04, 2016; limited brain MRI August 06, 2016 FINDINGS: Brain: The ventricles are normal in size and configuration. There is no intracranial mass, hemorrhage, extra-axial fluid collection, or midline shift. Brain parenchyma appears unremarkable. No acute infarct evident. Foci of basal ganglia calcification is felt to be physiologic. Vascular: There is no evident hyperdense vessel. There are foci of calcification in each carotid siphon region. Skull: The bony calvarium appears intact. Sinuses/Orbits: There is mild mucosal thickening in several ethmoid air cells. Other visualized paranasal sinuses are clear. Visualized orbits appear symmetric bilaterally. Other: Mastoid air cells are clear. IMPRESSION: Brain parenchyma appears unremarkable. No mass or hemorrhage evident. There are foci of arterial vascular calcification. There is mild mucosal thickening in several ethmoid air cells.  Electronically Signed   By: Lowella Grip III M.D.   On: 06/15/2018 20:19   Dg Chest Port 1 View  Result Date: 06/15/2018 CLINICAL DATA:  Altered mental status with hypoxia EXAM: PORTABLE CHEST 1 VIEW COMPARISON:  02/02/2018 FINDINGS: Mild cardiomegaly. Vascular congestion and diffuse interstitial opacities. No pleural effusion or pneumothorax. IMPRESSION: Mild cardiomegaly with vascular congestion. Diffuse interstitial opacity could reflect interstitial edema or interstitial inflammatory process. Electronically Signed   By: Donavan Foil M.D.   On: 06/15/2018 17:41    Procedures Procedures (including critical care time)  Medications Ordered in ED Medications - No data to display   Initial Impression / Assessment and Plan / ED Course  I have reviewed the triage vital signs and the nursing notes.  Pertinent labs & imaging results that were available during my care of the patient were reviewed by me and considered in my medical decision making (see chart for details).  Clinical Course as of Jun 15 2257  Tue Jun 15, 2018  2021 Pt's husband is now present.  He states she has been having gradually increasing confusion with slurred speech and balance problems for the past few weeks.  He states today it is overall the worst he is seen.  He states he went home today because he called her and she did not answer.  States she fell and caught herself on the close basket.  She was confused and unable to get up, therefore he called EMS.  He also reports she has been seeming winded for the  past few weeks, however denied any respiratory symptoms.  He states she smokes it and he has been unable to get her to stop.   [JR]  2118 Dr. Alcario Drought with [facility] accepting admission.   [JR]    Clinical Course User Index [JR] Robinson, [place] N, PA-C    Patient presenting via EMS after a fall with confusion and slurred speech.  Per patient's husband, she has had gait instability, slurred speech and intermittent  confusion for a few weeks now, however acutely worsening today.  He checked on her at home and he found that she had fallen and was too confused unable to get up on her own therefore he called EMS.  On arrival, patient is hypoxic at 85%, requiring 2 L nasal cannula.  Normal rectal temp.  She has slurred speech and does not make much sense when asked to describe what happened today, however she is alert to verbal stimuli.  She is oriented to person, town, month and year.  She is following commands without evidence of focal neuro deficit.  Breath sounds coarse bilaterally.  No lower extremity edema to suggest fluid overload.  Chest x-ray with evidence of vascular congestion, question infectious versus fluid overload as etiology.  BNP ordered distinguish.  Proved from previous.  CMP revealing AKI with creatinine of 2.2, up from previous 1.5.  Liver function is normal.  Glucose is 94.  hCG ordered in triage, however patient is status post hysterectomy.  Alcohol level is on detectable.  No lactic acidosis.  CT head without evidence of acute pathology.  UA pending.  UDS pending.  Ammonia pending.  BNP pending.  Patient will require admission for altered mental status of unknown etiology, AKI, hypoxia with new oxygen requirement.  [facility] hospitalist, Dr. Alcario Drought, accepting admission.  The patient appears reasonably stabilized for admission considering the current resources, flow, and capabilities available in the ED at this time, and I doubt any other [facility] requiring further screening and/or treatment in the ED prior to admission.  Final Clinical Impressions(s) / ED Diagnoses   Final diagnoses:  AKI (acute kidney injury) [facility])  Confusion  Dysarthria  Hypoxia    ED Discharge Orders    None       Robinson, [place] N, PA-C 06/15/18 2258    Malvin Johns, MD 06/16/18 [id number]

## 2018-06-15 NOTE — ED Notes (Signed)
X-ray at bedside

## 2018-06-15 NOTE — ED Notes (Signed)
Gardner, MD at bedside.  

## 2018-06-15 NOTE — ED Notes (Signed)
Per family at bedside she spoke with daughter at 66 who said her speech was somewhat slurred then.

## 2018-06-15 NOTE — ED Notes (Signed)
Patient transported to MRI 

## 2018-06-15 NOTE — ED Notes (Signed)
ED Provider at bedside. 

## 2018-06-15 NOTE — ED Notes (Signed)
Family at bedside states that she spoke with patient at 0930 this morning and that the patient was oriented with clear speech.

## 2018-06-15 NOTE — ED Notes (Signed)
Alcario Drought, MD messaged about troponin result.

## 2018-06-15 NOTE — H&P (Signed)
History and Physical    MARSHAE [name] JME:[id number] DOB: [birth year] DOA: 06/15/2018  PCP: [name], MD  Patient coming from: Home  I have personally briefly reviewed patient's old medical records in [place]  Chief Complaint: Weakness  HPI: Anna [name] is a 57 y.o. 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  Patient presents to the ED with c/o AMS.  Patient has been sleepy / lethargic for the past couple of weeks.  Cold symptoms with productive cough for the past 1 week.  Today had worsening AMS with confusion and slurred speech.  Husband found her slumped over washing machine at 3pm.  Also noted to have R facial droop (sister thought it was new, husband thinks shes had it for some time).  Sister says she spoke on phone to patient and she sounded nomral at 6pm yesterday.   ED Course: Bicarb 15 AG 13, lactate 1.6.  Creat 2.19 up from 1.5 or so baseline.  WBC 4.7k.  HGB 10.1 (baseline about 9-10).  Macrocytosis that is also known to be chronic.   CT head: no acute findings.  CXR shows diffuse opacities of B lungs: edema vs inflammatory.  BNP is only 45.  UA, UDS still pending.  Review of Systems: As per HPI otherwise 10 point review of systems negative.   Past Medical History:  Diagnosis Date  . Acute CHF ([place]) 07/2016  . Depressive disorder, not elsewhere classified   . Family history of adverse reaction to anesthesia    mother passed away during surgery  . Hypertension   . Hypothyroid    Dr. Wilson Singer  . Kidney stone   . Lupus (systemic lupus erythematosus) ([place])   . Migraine   . Pure hypercholesterolemia   . Stroke [name])     Past Surgical History:  Procedure Laterality Date  . CESAREAN SECTION    . ECTOPIC PREGNANCY SURGERY     times 2  . JOINT REPLACEMENT    . LAPAROSCOPY     with rt salpingectomy  . [place]den SURGERY  2003  . TONSILLECTOMY    . TOTAL ABDOMINAL HYSTERECTOMY  2004   ovaries retained, DUB  . TUBAL LIGATION  1985     reports that she has been smoking cigarettes. She has a 17.50 pack-year smoking history. She has never used smokeless tobacco. She reports that she drinks alcohol. She reports that she does not use drugs.  No Known Allergies  Family History  Problem Relation Age of Onset  . Diabetes Mother   . Hypertension Sister   . Diabetes Maternal Grandmother   . Autoimmune disease Neg Hx      Prior to Admission medications   Medication Sig Start Date End Date Taking? Authorizing Provider  amLODipine (NORVASC) 10 MG tablet Take 10 mg by mouth daily. 04/17/18  Yes [provider]  aspirin 500 MG tablet Take 500 mg by mouth 2 (two) times daily.   Yes [provider]  atorvastatin (LIPITOR) 20 MG tablet Take 20 mg by mouth daily.   Yes [provider]  azaTHIOprine (IMURAN) 50 MG tablet Take 50 mg by mouth 2 (two) times daily.  02/20/17  Yes [provider]  calcitRIOL (ROCALTROL) 0.25 MCG capsule Take 0.25 mcg by mouth daily. 04/21/18  Yes [provider]  diazepam (VALIUM) 10 MG tablet Take 10 mg by mouth every 8 (eight) hours as needed for anxiety.    Yes [provider]  escitalopram (LEXAPRO) 10 MG tablet Take 30 mg by mouth daily.   Yes [provider]  folic acid (FOLVITE) 1 MG tablet Take 1 mg by mouth daily.   Yes [provider]  hydroxychloroquine (PLAQUENIL) 200 MG tablet Take 200 mg by mouth daily.  02/27/17  Yes [provider]  levothyroxine (SYNTHROID, LEVOTHROID) 50 MCG tablet Take 50 mcg by mouth daily before breakfast.   Yes [provider]  losartan (COZAAR) 100 MG tablet Take 1 tablet (100 mg total) by mouth daily. 02/15/18  Yes Mariel Aloe, MD  metoprolol succinate (TOPROL-XL) 50 MG 24 hr tablet Take 50 mg by mouth daily. Take with or immediately following a meal.   Yes [provider]  omeprazole (PRILOSEC) 20 MG capsule Take 20 mg  by mouth daily.   Yes [provider]  ondansetron (ZOFRAN) 4 MG tablet Take 1 tablet (4 mg total) by mouth every 6 (six) hours as needed for nausea. 02/13/18  Yes Mariel Aloe, MD  polyethylene glycol (MIRALAX / GLYCOLAX) packet Take 17 g by mouth 2 (two) times daily. 02/13/18  Yes Mariel Aloe, MD  predniSONE (DELTASONE) 10 MG tablet Take 10 mg by mouth daily with breakfast.   Yes [provider]  psyllium (METAMUCIL SMOOTH TEXTURE) 58.6 % powder Take 1 packet by mouth 3 (three) times daily. 02/13/18  Yes Mariel Aloe, MD  QUEtiapine (SEROQUEL) 400 MG tablet Take 400 mg by mouth at bedtime.   Yes [provider]  ranitidine (ZANTAC 75) 75 MG tablet Take 75 mg by mouth 2 (two) times daily as needed for indigestion.   Yes [provider]  sodium bicarbonate 650 MG tablet Take 1,300 mg by mouth 2 (two) times daily.   Yes [provider]  spironolactone (ALDACTONE) 25 MG tablet Take 25 mg by mouth every other day.   Yes [provider]  vitamin B-12 (CYANOCOBALAMIN) 1000 MCG tablet Take 1,000 mcg by mouth daily.   Yes [provider]  diphenhydrAMINE (BENADRYL) 25 MG tablet Take 25 mg by mouth at bedtime.    [provider]    Physical Exam: Vitals:   06/15/18 1751 06/15/18 1845 06/15/18 1915 06/15/18 1945  BP:  (!) 139/93 135/85 (!) 143/94  Pulse:  89 92 89  Resp:  18 (!) 21 20  Temp: 98.1 F (36.7 C)     TempSrc: Rectal     SpO2:  98% 94% 95%    Constitutional: NAD, calm, comfortable Eyes: PERRL, lids and conjunctivae normal ENMT: Mucous membranes are moist. Posterior pharynx clear of any exudate or lesions.Normal dentition.  Neck: normal, supple, no masses, no thyromegaly Respiratory: Distant breath sounds with few rhonchi, no wheezes, wet sounding cough. Cardiovascular: Regular rate and rhythm, no murmurs / rubs / gallops. Trace edema. 2+ pedal pulses. No carotid bruits.  Abdomen: no tenderness, no masses  palpated. No hepatosplenomegaly. Bowel sounds positive.  Musculoskeletal: no clubbing / cyanosis. No joint deformity upper and lower extremities. Good ROM, no contractures. Normal muscle tone.  Skin: no rashes, lesions, ulcers. No induration Neurologic: Mildly slurred speech, R facial droop but eyebrow raise is symmetric (not a bells palsy). Strength 5/5 in all 4.  Psychiatric: Sleepy but wakes up to voice.   Labs on Admission: I have personally reviewed following labs and imaging studies  CBC: Recent Labs  Lab 06/15/18 1638  WBC 4.7  HGB 10.1*  HCT 27.9*  MCV 108.1*  PLT 221  Basic Metabolic Panel: Recent Labs  Lab 06/15/18 1638  NA 143  K 4.6  CL 115*  CO2 15*  GLUCOSE 94  BUN 29*  CREATININE 2.19*  CALCIUM 8.8*   GFR: CrCl cannot be calculated (Unknown ideal weight.). Liver Function Tests: Recent Labs  Lab 06/15/18 1638  AST 41  ALT 18  ALKPHOS 77  BILITOT 0.5  PROT 7.4  ALBUMIN 3.7   No results for input(s): LIPASE, AMYLASE in the last 168 hours. No results for input(s): AMMONIA in the last 168 hours. Coagulation Profile: No results for input(s): INR, PROTIME in the last 168 hours. Cardiac Enzymes: No results for input(s): CKTOTAL, CKMB, CKMBINDEX, TROPONINI in the last 168 hours. BNP (last 3 results) No results for input(s): PROBNP in the last 8760 hours. HbA1C: No results for input(s): HGBA1C in the last 72 hours. CBG: Recent Labs  Lab 06/15/18 1614  GLUCAP 95   Lipid Profile: No results for input(s): CHOL, HDL, LDLCALC, TRIG, CHOLHDL, LDLDIRECT in the last 72 hours. Thyroid Function Tests: No results for input(s): TSH, T4TOTAL, FREET4, T3FREE, THYROIDAB in the last 72 hours. Anemia Panel: No results for input(s): VITAMINB12, FOLATE, FERRITIN, TIBC, IRON, RETICCTPCT in the last 72 hours. Urine analysis:    Component Value Date/Time   COLORURINE STRAW (A) 02/12/2018 1730   APPEARANCEUR CLEAR 02/12/2018 1730   LABSPEC 1.005 02/12/2018 1730     PHURINE 5.0 02/12/2018 1730   GLUCOSEU NEGATIVE 02/12/2018 1730   HGBUR NEGATIVE 02/12/2018 1730   BILIRUBINUR NEGATIVE 02/12/2018 1730   BILIRUBINUR n 07/20/2013 0948   KETONESUR NEGATIVE 02/12/2018 1730   PROTEINUR NEGATIVE 02/12/2018 1730   UROBILINOGEN 0.2 05/11/2015 2201   NITRITE NEGATIVE 02/12/2018 1730   LEUKOCYTESUR NEGATIVE 02/12/2018 1730    Radiological Exams on Admission: Ct Head Wo Contrast  Result Date: 06/15/2018 CLINICAL DATA:  Altered mental status.  Right-sided facial droop EXAM: CT HEAD WITHOUT CONTRAST TECHNIQUE: Contiguous axial images were obtained from the base of the skull through the vertex without intravenous contrast. COMPARISON:  Head CT August 04, 2016; limited brain MRI August 06, 2016 FINDINGS: Brain: The ventricles are normal in size and configuration. There is no intracranial mass, hemorrhage, extra-axial fluid collection, or midline shift. Brain parenchyma appears unremarkable. No acute infarct evident. Foci of basal ganglia calcification is felt to be physiologic. Vascular: There is no evident hyperdense vessel. There are foci of calcification in each carotid siphon region. Skull: The bony calvarium appears intact. Sinuses/Orbits: There is mild mucosal thickening in several ethmoid air cells. Other visualized paranasal sinuses are clear. Visualized orbits appear symmetric bilaterally. Other: Mastoid air cells are clear. IMPRESSION: Brain parenchyma appears unremarkable. No mass or hemorrhage evident. There are foci of arterial vascular calcification. There is mild mucosal thickening in several ethmoid air cells. Electronically Signed   By: Lowella Grip III M.D.   On: 06/15/2018 20:19   Dg Chest Port 1 View  Result Date: 06/15/2018 CLINICAL DATA:  Altered mental status with hypoxia EXAM: PORTABLE CHEST 1 VIEW COMPARISON:  02/02/2018 FINDINGS: Mild cardiomegaly. Vascular congestion and diffuse interstitial opacities. No pleural effusion or  pneumothorax. IMPRESSION: Mild cardiomegaly with vascular congestion. Diffuse interstitial opacity could reflect interstitial edema or interstitial inflammatory process. Electronically Signed   By: Donavan Foil M.D.   On: 06/15/2018 17:41    EKG: Independently reviewed.  Assessment/Plan Principal Problem:   Acute encephalopathy Active Problems:   SLE (systemic lupus erythematosus) (H[facility])   Acute respiratory failure with hypoxia (H[facility])   Pulmonary hypertension (  [place])   Acute-on-chronic kidney injury ([place])   RTA (renal tubular acidosis)   HTN (hypertension)   Weakness on right side of face    1. Acute encephalopathy - 1. Unclear cause, incomplete work up thus far 2. UA, UDS pending 3. MRI brain given the R facial droop 4. On seroquel, but not a whole lot of other sedating meds to be causing this. 2. Acute respiratory failure with hypoxia - 1. Diffuse opacity 2. Doesn't seem to be pulmonary edema given no peripheral edema and BNP is only 45 (was >300 in Jan 2018 when she did have pulm edema). 3. No SIRS 4. Looks like shes not on MTX anymore 5. PNA vs SLE? Will obtain CT chest non-contrast to get a better look at this. 3. RTA - 1. Increase PO bicarb to TID due to bicarb of 15 today 2. Repeat BMP in AM 4. AKI on CKD stage 3 - 1. UA pending 2. Strict intake and output 3. IVF: 125 cc/hr NS 4. If not improving by AM, needs nephro eval 5. SLE - 1. continue imuran, plaquenil, and prednisone 2. Doesn't appear to be in adrenal crisis, will leave prednisone at current dose for the moment. 3. Checking ESR / CRP 6. HTN - 1. continue home BP meds 2. But will hold diuretics and ARB due to AKI  DVT prophylaxis: Lovenox Code Status: Full Family Communication: Husband at bedside Disposition Plan: TBD Consults called: None Admission status: Admit to inpatient  Severity of Illness: The appropriate patient status for this patient is INPATIENT. Inpatient status is judged to be reasonable  and necessary in order to provide the required intensity of service to ensure the patient's safety. The patient's presenting symptoms, physical exam findings, and initial radiographic and laboratory data in the context of their chronic comorbidities is felt to place them at high risk for further clinical deterioration. Furthermore, it is not anticipated that the patient will be medically stable for discharge from the hospital within 2 midnights of admission. The following factors support the patient status of inpatient.   " The patient's presenting symptoms include AMS, lethargy. " The worrisome physical exam findings include AMS, facial droop, coarse breath sounds, new O2 requirement. " The initial radiographic and laboratory data are worrisome because of B pulmonary opacities, AKI. " The chronic co-morbidities include SLE, PAH.   * I certify that at the point of admission it is my clinical judgment that the patient will require inpatient hospital care spanning beyond 2 midnights from the point of admission due to high intensity of service, high risk for further deterioration and high frequency of surveillance required.Etta Quill DO [facility] Pager [phone] Only works nights!  If 7AM-7PM, please contact the primary day team physician taking care of patient  [email or web address] Password [facility]  06/15/2018, 9:37 PM

## 2018-06-15 NOTE — ED Notes (Signed)
Lab able to add on BNP, RN to collect ammonia.

## 2018-06-16 ENCOUNTER — Inpatient Hospital Stay (HOSPITAL_COMMUNITY): Payer: BLUE CROSS/BLUE SHIELD

## 2018-06-16 DIAGNOSIS — I10 Essential (primary) hypertension: Secondary | ICD-10-CM | POA: Diagnosis not present

## 2018-06-16 DIAGNOSIS — G9340 Encephalopathy, unspecified: Secondary | ICD-10-CM | POA: Diagnosis not present

## 2018-06-16 DIAGNOSIS — N183 Chronic kidney disease, stage 3 unspecified: Secondary | ICD-10-CM

## 2018-06-16 DIAGNOSIS — R7989 Other specified abnormal findings of blood chemistry: Secondary | ICD-10-CM

## 2018-06-16 DIAGNOSIS — J9601 Acute respiratory failure with hypoxia: Secondary | ICD-10-CM | POA: Diagnosis not present

## 2018-06-16 DIAGNOSIS — R778 Other specified abnormalities of plasma proteins: Secondary | ICD-10-CM

## 2018-06-16 LAB — TROPONIN I
Troponin I: 0.04 ng/mL (ref <0.03)
Troponin I: <0.03 ng/mL (ref <0.03)

## 2018-06-16 LAB — HIV ANTIBODY (ROUTINE TESTING W REFLEX): HIV SCREEN 4TH GENERATION: NONREACTIVE

## 2018-06-16 MED ORDER — ENOXAPARIN SODIUM 40 MG/0.4ML ~~LOC~~ SOLN
40.0000 mg | Freq: Every day | SUBCUTANEOUS | Status: DC
  Administered 2018-06-16 – 2018-06-18 (×3): 40 mg via SUBCUTANEOUS
  Filled 2018-06-16 (×4): qty 0.4

## 2018-06-16 MED ORDER — ACETAMINOPHEN 325 MG PO TABS
650.0000 mg | ORAL_TABLET | Freq: Four times a day (QID) | ORAL | Status: DC | PRN
  Administered 2018-06-16 – 2018-06-17 (×3): 650 mg via ORAL
  Filled 2018-06-16 (×3): qty 2

## 2018-06-16 MED ORDER — ACETAMINOPHEN 650 MG RE SUPP: 650.0000 mg | Freq: Four times a day (QID) | RECTAL | Status: DC | PRN

## 2018-06-16 NOTE — Progress Notes (Signed)
Anna [name] is a 56 y.o. female patient admitted from ED awake, alert - oriented  X 4 - no acute distress noted.  VSS - Blood pressure 139/89, pulse 91, temperature 98.4 F (36.9 C), temperature source Oral, resp. rate 18, height 5\' 6"  (1.676 m), weight 81.2 kg, last menstrual period 07/07/2002, SpO2 93 %.    IV in place, occlusive dsg intact without redness.  Orientation to room, and floor completed with information packet given to patient/family.  Patient declined safety video at this time.  Admission INP armband ID verified with patient/family, and in place.   SR up x 2, fall assessment complete, with patient and family able to verbalize understanding of risk associated with falls, and verbalized understanding to call nsg before up out of bed.  Call light within reach, patient able to voice, and demonstrate understanding.  Skin, clean-dry- intact without evidence of bruising, or skin tears.   No evidence of skin break down noted on exam.     Will cont to eval and treat per MD orders.  Holley Raring, RN 06/16/2018 4:06 PM

## 2018-06-16 NOTE — Progress Notes (Addendum)
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  In the emergency department noted to be confused with slurred speech but oriented to person, time, month, year.  Noted to follow commands without evidence of focal neuro deficit.  Assessment/Plan Acute encephalopathy of unclear etiology, right facial droop, progressive generalized weakness, confusion.  CT head negative.  MRI brain no acute intracranial abnormality.  Urinalysis negative.  CT chest unremarkable.  Other than AKI, laboratory studies unremarkable.  CRP elevated, unclear significance and patient with lupus.  Possibly new medication, however not Seroquel. --Etiology unclear, suspect medication related, still appears encephalopathic.  Hold Seroquel and Valium. --Follow clinically.  Check EEG.  Acute hypoxic respiratory failure.  CT chest no acute abnormality.  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 --CT chest negative for acute abnormalities --Suspect secondary to COPD, more chronic rather than acute. --Wean oxygen as tolerated.  COPD appears stable.  Elevated troponin --Normal, in context of CKD.  EKG sinus rhythm, no acute changes.  No further evaluation suggested.  Generalized weakness x1 month --Nonfocal exam.  MRI negative.  Etiology unclear.  PT evaluation.  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  Hold losartan.  AKI superimposed on CKD stage III, RTA --Hold losartan.  Continue IV fluids.  Continue chronic bicarbonate supplementation --Recheck BMP in a.m.  Hypothyroidism --continue levothyroxine . --Check TSH  Depression --Hold  quetiapine  Cigarette smoker --Recommend cessation.  DVT prophylaxis: enoxaparin Code Status: Full Family Communication: husband at bedside Disposition Plan: home    Murray Hodgkins, MD  [facility] Direct contact: [phone] --[place] [place]  --[email or web address]; password [email or web address]  7PM-7AM contact night coverage as above 06/16/2018, 12:51 PM  LOS: 0 days   Consultants:    Procedures:    Antimicrobials:    Interval history/Subjective: Reports some right side pain, otherwise no complaints.  Husband at bedside reports patient appears better, facial droop appears nearly resolved.  Objective: Vitals:  Vitals:   06/16/18 0705 06/16/18 1231  BP: 135/81 (!) 148/92  Pulse: 95 81  Resp: 18 20  Temp:  98.5 F (36.9 C)  SpO2: 93% 97%    Exam:  Constitutional:  . Appears calm and comfortable Eyes:  . pupils and irises appear normal . Normal lids  ENMT:  . grossly normal hearing  . Lips appear normal Respiratory:  . CTA bilaterally, no w/r/r.  . Respiratory effort normal.  Cardiovascular:  . RRR, no m/r/g . No LE extremity edema   Abdomen:  . Soft, nontender, nondistended Musculoskeletal:  . RUE, LUE, RLE, LLE   o strength and tone normal, no atrophy, no abnormal movements o No tenderness, masses Skin:  . No rashes, lesions, ulcers . Flank and back appears unremarkable.  No bony tenderness over the thoracic or lumbar spine. Neurologic:  . CN 2-12 appear grossly intact. . No pronator drift. Psychiatric:  . Mental status o Mood difficult to gauge; affect odd, somnolent o Oriented to person, hospital, month, year, yesterday's events. . judgment and insight difficult to gauge.  I have personally reviewed the following:   Data: . BMP on admission creatinine 2.19, no repeat test yet.  LFTs unremarkable.  Hemoglobin on admission 10.1 with normal WBC and platelets.  Lactic acid  within normal limits.  Serum alcohol negative.  Urine drug screen positive  for benzodiazepines.  Troponin 0.03, 0.04  Scheduled Meds: . amLODipine  10 mg Oral Daily  . atorvastatin  20 mg Oral Daily  . azaTHIOprine  50 mg Oral BID  . calcitRIOL  0.25 mcg Oral Daily  . enoxaparin (LOVENOX) injection  40 mg Subcutaneous Daily  . escitalopram  30 mg Oral Daily  . folic acid  1 mg Oral Daily  . hydroxychloroquine  200 mg Oral Daily  . levothyroxine  50 mcg Oral QAC breakfast  . metoprolol succinate  50 mg Oral Daily  . pantoprazole  40 mg Oral Daily  . predniSONE  10 mg Oral Q breakfast  . QUEtiapine  400 mg Oral QHS  . sodium bicarbonate  1,300 mg Oral TID  . vitamin B-12  1,000 mcg Oral Daily   Continuous Infusions: . sodium chloride 125 mL/hr at 06/16/18 1045    Principal Problem:   Acute encephalopathy Active Problems:   SLE (systemic lupus erythematosus) (HCC)   Acute respiratory failure with hypoxia (HCC)   RTA (renal tubular acidosis)   HTN (hypertension)   Weakness on right side of face   Elevated troponin   CKD (chronic kidney disease), stage III ([place])   LOS: 0 days

## 2018-06-16 NOTE — ED Notes (Signed)
EEG arrives at bedside as pt being transferred advised pt will be going to 5W.

## 2018-06-16 NOTE — Progress Notes (Signed)
EEG Completed; Results Pending  

## 2018-06-16 NOTE — Progress Notes (Signed)
RN will call EEG when pt is available.

## 2018-06-16 NOTE — Procedures (Signed)
ELECTROENCEPHALOGRAM REPORT   Patient: Anna [name]       Room #: 9F63W EEG No. ID: [id number] Age: 57 y.o.        Sex: female Referring Physician: Sarajane Jews Report Date:  06/16/2018        Interpreting Physician: Alexis Goodell  History: Anna [name] is an 57 y.o. female with altered mental status  Medications:  Norvasc, Lipitor, Imuran, Rocaltrol, Lexapro, Folvite, Plaquenil, Synthroid, Toprol, Protonix, Seroquel, B12  Conditions of Recording:  This is a 21 channel routine scalp EEG performed with bipolar and monopolar montages arranged in accordance to the international 10/20 system of electrode placement. One channel was dedicated to EKG recording.  The patient is in the awake state.  Description:  The waking background activity consists of a low voltage, symmetrical, fairly well organized, 8 Hz alpha activity, seen from the parieto-occipital and posterior temporal regions.  Low voltage fast activity, poorly organized, is seen anteriorly and is at times superimposed on more posterior regions.  A mixture of theta and alpha rhythms are seen from the central and temporal regions. The patient does not drowse or sleep. No epileptiform activity is noted.   Hyperventilation and intermittent photic stimulation were not performed.  IMPRESSION: This is a normal awake electroencephalogram. There are no focal lateralizing or epileptiform features.   Alexis Goodell, MD Neurology [phone] 06/16/2018, 8:30 PM

## 2018-06-17 ENCOUNTER — Other Ambulatory Visit: Payer: Self-pay

## 2018-06-17 ENCOUNTER — Encounter (HOSPITAL_COMMUNITY): Payer: Self-pay | Admitting: *Deleted

## 2018-06-17 DIAGNOSIS — J9601 Acute respiratory failure with hypoxia: Secondary | ICD-10-CM | POA: Diagnosis not present

## 2018-06-17 DIAGNOSIS — G9340 Encephalopathy, unspecified: Secondary | ICD-10-CM | POA: Diagnosis not present

## 2018-06-17 DIAGNOSIS — N178 Other acute kidney failure: Secondary | ICD-10-CM | POA: Diagnosis not present

## 2018-06-17 DIAGNOSIS — I10 Essential (primary) hypertension: Secondary | ICD-10-CM

## 2018-06-17 DIAGNOSIS — N183 Chronic kidney disease, stage 3 (moderate): Secondary | ICD-10-CM

## 2018-06-17 LAB — BASIC METABOLIC PANEL
Anion gap: 9 (ref 5–15)
BUN: 24 mg/dL — ABNORMAL HIGH (ref 6–20)
CO2: 16 mmol/L — ABNORMAL LOW (ref 22–32)
Calcium: 8.1 mg/dL — ABNORMAL LOW (ref 8.9–10.3)
Chloride: 118 mmol/L — ABNORMAL HIGH (ref 98–111)
Creatinine, Ser: 1.8 mg/dL — ABNORMAL HIGH (ref 0.44–1.00)
GFR calc Af Amer: 35 mL/min — ABNORMAL LOW (ref >60)
GFR calc non Af Amer: 30 mL/min — ABNORMAL LOW (ref >60)
Glucose, Bld: 94 mg/dL (ref 70–99)
POTASSIUM: 4.1 mmol/L (ref 3.5–5.1)
Sodium: 143 mmol/L (ref 135–145)

## 2018-06-17 LAB — TSH: TSH: 0.41 u[IU]/mL (ref 0.350–4.500)

## 2018-06-17 NOTE — Evaluation (Signed)
Physical Therapy Evaluation Patient Details Name: Anna [name] MRN: [id number] DOB: June 13, 1961 Today's Date: 06/17/2018   History of Present Illness  pt is a 57 y/o female with h/o lupus, CVA, CKD3, HTN, presenting with confusion and acute kidney injury.  Clinical Impression  Pt admitted with/for AMS due for the most part due to acute kidney injury.  Pt presently needing min guard to min assist for basic mobility and gait.  Pt would be safest if she had  Up to 24 * supervision initially .  Pt currently limited functionally due to the problems listed below.  (see problems list.)  Pt will benefit from PT to maximize function and safety to be able to get home safely with available assist.     Follow Up Recommendations Home health PT;Supervision/Assistance - 24 hour    Equipment Recommendations  Other (comment)(TBA)    Recommendations for Other Services       Precautions / Restrictions Precautions Precautions: Fall      Mobility  Bed Mobility Overal bed mobility: Needs Assistance Bed Mobility: Supine to Sit;Sit to Supine     Supine to sit: Supervision Sit to supine: Supervision      Transfers Overall transfer level: Needs assistance Equipment used: Rolling walker (2 wheeled) Transfers: Sit to/from Stand Sit to Stand: Min guard            Ambulation/Gait Ambulation/Gait assistance: Min guard Gait Distance (Feet): 280 Feet Assistive device: Rolling walker (2 wheeled) Gait Pattern/deviations: Step-through pattern Gait velocity: moderater Gait velocity interpretation: 1.31 - 2.62 ft/sec, indicative of limited community ambulator General Gait Details: pt walking faster than she can control easily.  Pt running into objes on the Left and not slowing down while getting the wheel un stuck..    Stairs            Wheelchair Mobility    Modified Rankin (Stroke Patients Only)       Balance Overall balance assessment: Needs assistance   Sitting balance-Leahy  Scale: Good       Standing balance-Leahy Scale: Fair                               Pertinent Vitals/Pain Pain Assessment: No/denies pain    Home Living Family/patient expects to be discharged to:: Private residence Living Arrangements: Spouse/significant other Available Help at Discharge: Family;Available PRN/intermittently(husband works at [employer] away from home) Type of Home: [facility] Access: Level entry     [place]: One level;Able to live on main level with bedroom/bathroom Home Equipment: None      Prior Function Level of Independence: Independent with assistive device(s)(in a homelike environment)               Hand Dominance        Extremity/Trunk Assessment   Upper Extremity Assessment Upper Extremity Assessment: Overall WFL for tasks assessed    Lower Extremity Assessment Lower Extremity Assessment: Overall WFL for tasks assessed(MILD proximal weakness)       Communication   Communication: No difficulties  Cognition Arousal/Alertness: Awake/alert Behavior During Therapy: WFL for tasks assessed/performed Overall Cognitive Status: Within Functional Limits for tasks assessed                                        General Comments      Exercises  Assessment/Plan    PT Assessment Patient needs continued PT services  PT Problem List Decreased strength;Decreased activity tolerance;Decreased balance;Decreased mobility;Decreased coordination       PT Treatment Interventions DME instruction;Gait training;Stair training;Functional mobility training;Therapeutic activities;Balance training;Patient/family education    PT Goals (Current goals can be found in the Care Plan section)  Acute Rehab PT Goals Patient Stated Goal: I'm ready to get back home PT Goal Formulation: With patient Time For Goal Achievement: 07/01/18    Frequency Min 3X/week   Barriers to discharge   pt not able to relate how  she will have up to 24/7 help initially    Co-evaluation               AM-PAC PT "6 Clicks" Mobility  Outcome Measure Help needed turning from your back to your side while in a flat bed without using bedrails?: None Help needed moving from lying on your back to sitting on the side of a flat bed without using bedrails?: None Help needed moving to and from a bed to a chair (including a wheelchair)?: A Little Help needed standing up from a chair using your arms (e.g., wheelchair or bedside chair)?: A Little Help needed to walk in hospital room?: A Little Help needed climbing 3-5 steps with a railing? : A Little 6 Click Score: 20    End of Session   Activity Tolerance: Patient tolerated treatment well Patient left: in bed   PT Visit Diagnosis: Unsteadiness on feet (R26.81);Other abnormalities of gait and mobility (R26.89);Muscle weakness (generalized) (M62.81)    Time: 8832-5498 PT Time Calculation (min) (ACUTE ONLY): 24 min   Charges:     PT Treatments $Gait Training: 8-22 mins $Therapeutic Activity: 8-22 mins        06/17/2018  Donnella Sham, PT Acute Rehabilitation Services [phone]  (pager) [phone]  (office)  Tessie Fass Raynaldo Falco 06/17/2018, 3:57 PM

## 2018-06-17 NOTE — Plan of Care (Signed)
  Problem: Education: Goal: Knowledge of General Education information will improve Description Including pain rating scale, medication(s)/side effects and non-pharmacologic comfort measures Outcome: Progressing   Problem: Clinical Measurements: Goal: Ability to maintain clinical measurements within normal limits will improve Outcome: Progressing   Problem: Coping: Goal: Level of anxiety will decrease Outcome: Progressing   Problem: Elimination: Goal: Will not experience complications related to bowel motility Outcome: Progressing   Problem: Pain Managment: Goal: General experience of comfort will improve Outcome: Progressing

## 2018-06-17 NOTE — Progress Notes (Signed)
PROGRESS NOTE        PATIENT DETAILS Name: Anna [name] Age: 57 y.o. Sex: female Date of Birth: 15-Apr-1961 Admit Date: 06/15/2018 Admitting Physician Etta Quill, DO [id number], Bryn Gulling, MD  Brief Narrative: [name] with history of lupus, prior history of CVA, stage III CKD, hypertension-presented with confusion and acute kidney injury.  See below for further details  Subjective: She is completely awake and alert this morning.  She was talking about the impeachment process with me-and was making sense.  She was requesting discharge home this morning.  Spoke to the patient's spouse over the phone-her mental status is now significantly improved and he feels it is very close to her usual baseline.  Assessment/Plan: Acute encephalopathy: Confusion has markedly improved-she is completely awake and alert.  Extensive work-up including CT head/MRI brain and EEG with no major abnormalities.  Urine drug screen did not show any illicit substances.  Suspect that encephalopathy is likely metabolic and probably secondary to acute kidney injury-as she has improved with just supportive care and improvement in renal function.   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  Continue to avoid nephrotoxic agents.  Has mild metabolic acidosis-continue bicarb supplementation.  ?  Acute hypoxic respiratory failure: Although chest x-ray showed some diffuse interstitial changes-none changes were seen on CT chest.  She is improved-and is now on room air.  She does have extensive centrilobular emphysema on CT.  Minimally elevated troponin: Trend is flat and not consistent with ACS-no clinical features to support ACS-EKG without any acute changes.  Doubt further work-up is required at this point.  History of lupus: Since encephalopathy seems to be secondary to acute kidney  injury-and is improving -no indication to suggest lupus flare causing AMS-she is being continued on Plaquenil, Imuran and prednisone.  Hypothyroidism: Continue levothyroxine  Hypertension: Controlled-continue amlodipine and metoprolol.  Depression: Stable-mood is very pleasant today-continue Seroquel  Tobacco abuse: Counseled  Generalized weakness: Apparently walks with a walker at baseline-appears to be much weaker and deconditioned-this is likely secondary to acute illness.  Await PT evaluation.  DVT Prophylaxis: Prophylactic Lovenox   Code Status: Full code  Family Communication: Spouse over the phone  Disposition Plan: Remain inpatient-Home in a few days  Antimicrobial agents: Anti-infectives (From admission, onward)   Start     Dose/Rate Route Frequency Ordered Stop   06/16/18 1000  hydroxychloroquine (PLAQUENIL) tablet 200 mg     200 mg Oral Daily 06/15/18 2124        Procedures: None  CONSULTS:  None  Time spent: 25- minutes-Greater than 50% of this time was spent in counseling, explanation of diagnosis, planning of further management, and coordination of care.  MEDICATIONS: Scheduled Meds: . amLODipine  10 mg Oral Daily  . atorvastatin  20 mg Oral Daily  . azaTHIOprine  50 mg Oral BID  . calcitRIOL  0.25 mcg Oral Daily  . enoxaparin (LOVENOX) injection  40 mg Subcutaneous Daily  . escitalopram  30 mg Oral Daily  . folic acid  1 mg Oral Daily  . hydroxychloroquine  200 mg Oral Daily  . levothyroxine  50 mcg Oral QAC breakfast  . metoprolol succinate  50 mg Oral Daily  . pantoprazole  40 mg Oral Daily  . predniSONE  10 mg Oral Q breakfast  . QUEtiapine  400 mg Oral QHS  . sodium bicarbonate  1,300 mg Oral TID  . vitamin B-12  1,000 mcg Oral Daily   Continuous Infusions: . sodium chloride 125 mL/hr at 06/17/18 0540   PRN Meds:.acetaminophen **OR** acetaminophen, ondansetron   PHYSICAL EXAM: Vital signs: Vitals:   06/16/18 1231 06/16/18 1512  06/16/18 2041 06/17/18 0509  BP: (!) 148/92 139/89 (!) 143/89 129/80  Pulse: 81 91 96 80  Resp: 20 18 20 18   Temp: 98.5 F (36.9 C) 98.4 F (36.9 C) 98.1 F (36.7 C) 97.6 F (36.4 C)  TempSrc: Oral Oral  Oral  SpO2: 97% 93% 97% 97%  Weight:  81.2 kg    Height:  5\' 6"  (1.676 m)     Filed Weights   06/16/18 1512  Weight: 81.2 kg   Body mass index is 28.89 kg/m.   General appearance :Awake, alert, not in any distress.  HEENT: Atraumatic and Normocephalic Neck: supple Resp:Good air entry bilaterally, no added sounds  CVS: S1 S2 regular, no murmurs.  GI: Bowel sounds present, Non tender and not distended with no gaurding, rigidity or rebound.No organomegaly Extremities: B/L Lower Ext shows no edema, both legs are warm to touch Neurology:  speech clear,Non focal, sensation is grossly intact. Musculoskeletal:No digital cyanosis Skin:No Rash, warm and dry Wounds:N/A  I have personally reviewed following labs and imaging studies  LABORATORY DATA: CBC: Recent Labs  Lab 06/15/18 1638  WBC 4.7  HGB 10.1*  HCT 27.9*  MCV 108.1*  PLT 161    Basic Metabolic Panel: Recent Labs  Lab 06/15/18 1638 06/17/18 0426  NA 143 143  K 4.6 4.1  CL 115* 118*  CO2 15* 16*  GLUCOSE 94 94  BUN 29* 24*  CREATININE 2.19* 1.80*  CALCIUM 8.8* 8.1*    GFR: Estimated Creatinine Clearance: 36.6 mL/min (A) (by C-G formula based on SCr of 1.8 mg/dL (H)).  Liver Function Tests: Recent Labs  Lab 06/15/18 1638  AST 41  ALT 18  ALKPHOS 77  BILITOT 0.5  PROT 7.4  ALBUMIN 3.7   No results for input(s): LIPASE, AMYLASE in the last 168 hours. Recent Labs  Lab 06/15/18 2118  AMMONIA 17    Coagulation Profile: No results for input(s): INR, PROTIME in the last 168 hours.  Cardiac Enzymes: Recent Labs  Lab 06/15/18 2118 06/16/18 0307 06/16/18 1835  TROPONINI 0.03* 0.04* <0.03    BNP (last 3 results) No results for input(s): PROBNP in the last 8760 hours.  HbA1C: No  results for input(s): HGBA1C in the last 72 hours.  CBG: Recent Labs  Lab 06/15/18 1614  GLUCAP 95    Lipid Profile: No results for input(s): CHOL, HDL, LDLCALC, TRIG, CHOLHDL, LDLDIRECT in the last 72 hours.  Thyroid Function Tests: Recent Labs    06/17/18 0426  TSH 0.410    Anemia Panel: No results for input(s): VITAMINB12, FOLATE, FERRITIN, TIBC, IRON, RETICCTPCT in the last 72 hours.  Urine analysis:    Component Value Date/Time   COLORURINE YELLOW 06/15/2018 2200   APPEARANCEUR CLEAR 06/15/2018 2200   LABSPEC 1.024 06/15/2018 2200   PHURINE 5.0 06/15/2018 2200   GLUCOSEU NEGATIVE 06/15/2018 2200   HGBUR NEGATIVE 06/15/2018 2200   BILIRUBINUR NEGATIVE 06/15/2018 2200   BILIRUBINUR n 07/20/2013 0948   KETONESUR NEGATIVE 06/15/2018 2200   PROTEINUR 30 (A) 06/15/2018 2200   UROBILINOGEN 0.2 05/11/2015 2201   NITRITE NEGATIVE 06/15/2018 2200   LEUKOCYTESUR NEGATIVE 06/15/2018  2200    Sepsis Labs: Lactic Acid, Venous    Component Value Date/Time   LATICACIDVEN 0.45 (L) 06/15/2018 1923    MICROBIOLOGY: No results found for this or any previous visit (from the past 240 hour(s)).  RADIOLOGY STUDIES/RESULTS: Ct Head Wo Contrast  Result Date: 06/15/2018 CLINICAL DATA:  Altered mental status.  Right-sided facial droop EXAM: CT HEAD WITHOUT CONTRAST TECHNIQUE: Contiguous axial images were obtained from the base of the skull through the vertex without intravenous contrast. COMPARISON:  Head CT August 04, 2016; limited brain MRI August 06, 2016 FINDINGS: Brain: The ventricles are normal in size and configuration. There is no intracranial mass, hemorrhage, extra-axial fluid collection, or midline shift. Brain parenchyma appears unremarkable. No acute infarct evident. Foci of basal ganglia calcification is felt to be physiologic. Vascular: There is no evident hyperdense vessel. There are foci of calcification in each carotid siphon region. Skull: The bony calvarium appears  intact. Sinuses/Orbits: There is mild mucosal thickening in several ethmoid air cells. Other visualized paranasal sinuses are clear. Visualized orbits appear symmetric bilaterally. Other: Mastoid air cells are clear. IMPRESSION: Brain parenchyma appears unremarkable. No mass or hemorrhage evident. There are foci of arterial vascular calcification. There is mild mucosal thickening in several ethmoid air cells. Electronically Signed   By: Lowella Grip III M.D.   On: 06/15/2018 20:19   Ct Chest Wo Contrast  Result Date: 06/15/2018 CLINICAL DATA:  Worsening generalized weakness and cough for 1 week. EXAM: CT CHEST WITHOUT CONTRAST TECHNIQUE: Multidetector CT imaging of the chest was performed following the standard protocol without IV contrast. COMPARISON:  Portable chest obtained earlier today. Chest CT dated 08/11/2016. FINDINGS: Cardiovascular: Small pericardial effusion with a maximum thickness of 8 mm. Normal sized heart. Mediastinum/Nodes: No enlarged mediastinal or axillary lymph nodes. Thyroid gland, trachea, and esophagus demonstrate no significant findings. Lungs/Pleura: Extensive bilateral bullous changes and mild diffuse ground-glass opacity of the interstitial markings without significant change. No pleural fluid. Upper Abdomen: Unremarkable. Musculoskeletal: Minimal thoracic spine degenerative changes. IMPRESSION: 1. No acute abnormality. 2. 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 3. Decreased size of a small pericardial effusion. Emphysema (ICD10-J43.9). Electronically Signed   By: Claudie Revering M.D.   On: 06/15/2018 23:38   Mr Brain Wo Contrast  Result Date: 06/15/2018 CLINICAL DATA:  57 y/o F; patient presenting with altered mental status with confusion and slurred speech. History of CVA versus TIA, SLE, hypertension, heart failure, CKD stage 3. Cold symptoms with productive cough for 1 week. EXAM: MRI HEAD WITHOUT CONTRAST TECHNIQUE: Axial DWI, coronal DWI, axial T2  blade, axial T2 FLAIR blade, and axial susceptibility weighted sequences were acquired. COMPARISON:  06/15/2018 CT head.  08/06/2016 MRI head. FINDINGS: Brain: No acute infarction, hemorrhage, hydrocephalus, extra-axial collection or mass lesion. No structural or signal abnormality of the brain identified. Vascular: Normal flow voids. Skull and upper cervical spine: Normal marrow signal. Sinuses/Orbits: Negative. Other: None. IMPRESSION: No acute intracranial abnormality identified. Unremarkable MRI of the brain. Electronically Signed   By: Kristine Garbe M.D.   On: 06/15/2018 23:12   Dg Chest Port 1 View  Result Date: 06/15/2018 CLINICAL DATA:  Altered mental status with hypoxia EXAM: PORTABLE CHEST 1 VIEW COMPARISON:  02/02/2018 FINDINGS: Mild cardiomegaly. Vascular congestion and diffuse interstitial opacities. No pleural effusion or pneumothorax. IMPRESSION: Mild cardiomegaly with vascular congestion. Diffuse interstitial opacity could reflect interstitial edema or interstitial inflammatory process. Electronically Signed   By: Donavan Foil M.D.   On: 06/15/2018 17:41  LOS: 1 day   Oren Binet, MD  [facility]  If 7PM-7AM, please contact night-coverage  Please page via [email or web address] [email or web address]-click on MD name and type text message  06/17/2018, 1:17 PM

## 2018-06-18 ENCOUNTER — Other Ambulatory Visit: Payer: Self-pay

## 2018-06-18 DIAGNOSIS — N179 Acute kidney failure, unspecified: Secondary | ICD-10-CM | POA: Diagnosis not present

## 2018-06-18 DIAGNOSIS — N189 Chronic kidney disease, unspecified: Secondary | ICD-10-CM

## 2018-06-18 DIAGNOSIS — G9340 Encephalopathy, unspecified: Secondary | ICD-10-CM | POA: Diagnosis not present

## 2018-06-18 DIAGNOSIS — R7989 Other specified abnormal findings of blood chemistry: Secondary | ICD-10-CM | POA: Diagnosis not present

## 2018-06-18 DIAGNOSIS — J9601 Acute respiratory failure with hypoxia: Secondary | ICD-10-CM | POA: Diagnosis not present

## 2018-06-18 LAB — BASIC METABOLIC PANEL
Anion gap: 9 (ref 5–15)
BUN: 19 mg/dL (ref 6–20)
CO2: 20 mmol/L — AB (ref 22–32)
Calcium: 8.4 mg/dL — ABNORMAL LOW (ref 8.9–10.3)
Chloride: 116 mmol/L — ABNORMAL HIGH (ref 98–111)
Creatinine, Ser: 1.59 mg/dL — ABNORMAL HIGH (ref 0.44–1.00)
GFR calc Af Amer: 41 mL/min — ABNORMAL LOW (ref >60)
GFR calc non Af Amer: 35 mL/min — ABNORMAL LOW (ref >60)
GLUCOSE: 92 mg/dL (ref 70–99)
Potassium: 3.9 mmol/L (ref 3.5–5.1)
Sodium: 145 mmol/L (ref 135–145)

## 2018-06-18 MED ORDER — AMLODIPINE BESYLATE 5 MG PO TABS: 5.0000 mg | ORAL_TABLET | Freq: Every day | ORAL | 0 refills | Status: DC

## 2018-06-18 MED ORDER — QUETIAPINE FUMARATE 400 MG PO TABS: 400.0000 mg | ORAL_TABLET | Freq: Every day | ORAL | 0 refills | Status: DC

## 2018-06-18 MED ORDER — ASPIRIN EC 81 MG PO TBEC: 81.0000 mg | DELAYED_RELEASE_TABLET | Freq: Every day | ORAL | 2 refills | Status: DC

## 2018-06-18 NOTE — Discharge Summary (Signed)
PATIENT DETAILS Name: Anna [name] Age: 57 y.o. Sex: female Date of Birth: [birth year] MRN: [id number]. Admitting Physician: Etta Quill, DO YIR:SWNIOE, Bryn Gulling, MD  Admit Date: 06/15/2018 Discharge date: 06/18/2018  Recommendations for Outpatient Follow-up:  1. Follow up with PCP in 1-2 weeks 2. Please obtain BMP/CBC in one week 3. Losartan/spironolactone on hold-please reassess at next visit whether this needs to be held or resumed.  Admitted From:  Home   Disposition: [place]: No  Equipment/Devices: None  Discharge Condition: Stable  CODE STATUS: FULL CODE  Diet recommendation:  Heart Healthy  Brief Summary: See H&P, Labs, Consult and Test reports for all details in brief, [name] with history of lupus, prior history of CVA, stage III CKD, hypertension-presented with confusion and acute kidney injury.  See below for further details  Brief Hospital Course: Acute encephalopathy: Confusion has resolved-she is completely awake and alert.  Extensive work-up including CT head/MRI brain and EEG without any major abnormalities.  Urine drug screen did not show any illicit substances as well.  He did have acute kidney injury with metabolic acidosis on admission-probably the culprit of altered mental status.  Her altered mental status improved with improvement in renal function.  Since she has improved with just supportive care-and is now back to her baseline (per significant other) she is stable to be discharged home.  If in the future, she has similar episodes-may need further work-up and neurology evaluation.     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  Continue to avoid nephrotoxic agents.  Has mild metabolic acidosis-continue bicarb supplementation.  We will continue to hold losartan and Aldactone on discharge-PCP to reevaluate.  ?  Acute  hypoxic respiratory failure: Although chest x-ray showed some diffuse interstitial changes-none changes were seen on CT chest.  She is improved-and is now on room air.  She does have extensive centrilobular emphysema on CT.  Minimally elevated troponin: Trend is flat and not consistent with ACS-no clinical features to support ACS-EKG without any acute changes.  Doubt further work-up is required at this point.  History of lupus: Since encephalopathy seems to be secondary to acute kidney injury-and is improving -no indication to suggest lupus flare causing AMS-she is being continued on Plaquenil, Imuran and prednisone on discharge.  Hypothyroidism: Continue levothyroxine  Hypertension: Controlled-continue amlodipine and metoprolol.  Continue to hold losartan and Aldactone  Depression: Stable-mood is very pleasant today-continue Seroquel  Tobacco abuse: Counseled  Generalized weakness: Apparently walks with a walker at baseline-appears to be much weaker and deconditioned-this is likely secondary to acute illness.  PT evaluation completed-will order home health services as recommended.  Procedures/Studies: None  Discharge Diagnoses:  Principal Problem:   Acute encephalopathy Active Problems:   SLE (systemic lupus erythematosus) (HCC)   Acute respiratory failure with hypoxia (HCC)   RTA (renal tubular acidosis)   HTN (hypertension)   Weakness on right side of face   Elevated troponin   CKD (chronic kidney disease), stage III (HCC)   Encephalopathy acute   Discharge Instructions:  Activity:  As tolerated with Full fall precautions use walker/cane & assistance as needed   Discharge Instructions    Call MD for:  difficulty breathing, headache or visual disturbances   Complete by:  As directed    Diet - low sodium heart healthy   Complete by:  As directed  Increase activity slowly   Complete by:  As directed      Allergies as of 06/18/2018   No Known Allergies       Medication List    STOP taking these medications   aspirin 500 MG tablet Replaced by:  aspirin EC 81 MG tablet   losartan 100 MG tablet Commonly known as:  COZAAR   spironolactone 25 MG tablet Commonly known as:  ALDACTONE     TAKE these medications   amLODipine 10 MG tablet Commonly known as:  NORVASC Take 10 mg by mouth daily.   aspirin EC 81 MG tablet Take 1 tablet (81 mg total) by mouth daily. Replaces:  aspirin 500 MG tablet   atorvastatin 20 MG tablet Commonly known as:  LIPITOR Take 20 mg by mouth daily.   azaTHIOprine 50 MG tablet Commonly known as:  IMURAN Take 50 mg by mouth 2 (two) times daily.   calcitRIOL 0.25 MCG capsule Commonly known as:  ROCALTROL Take 0.25 mcg by mouth daily.   diazepam 10 MG tablet Commonly known as:  VALIUM Take 10 mg by mouth every 8 (eight) hours as needed for anxiety.   diphenhydrAMINE 25 MG tablet Commonly known as:  BENADRYL Take 25 mg by mouth at bedtime.   escitalopram 10 MG tablet Commonly known as:  LEXAPRO Take 30 mg by mouth daily.   folic acid 1 MG tablet Commonly known as:  FOLVITE Take 1 mg by mouth daily.   hydroxychloroquine 200 MG tablet Commonly known as:  PLAQUENIL Take 200 mg by mouth daily.   levothyroxine 50 MCG tablet Commonly known as:  SYNTHROID, LEVOTHROID Take 50 mcg by mouth daily before breakfast.   metoprolol succinate 50 MG 24 hr tablet Commonly known as:  TOPROL-XL Take 50 mg by mouth daily. Take with or immediately following a meal.   omeprazole 20 MG capsule Commonly known as:  PRILOSEC Take 20 mg by mouth daily.   ondansetron 4 MG tablet Commonly known as:  ZOFRAN Take 1 tablet (4 mg total) by mouth every 6 (six) hours as needed for nausea.   polyethylene glycol packet Commonly known as:  MIRALAX / GLYCOLAX Take 17 g by mouth 2 (two) times daily.   predniSONE 10 MG tablet Commonly known as:  DELTASONE Take 10 mg by mouth daily with breakfast.   psyllium 58.6 %  powder Commonly known as:  METAMUCIL SMOOTH TEXTURE Take 1 packet by mouth 3 (three) times daily.   QUEtiapine 400 MG tablet Commonly known as:  SEROQUEL Take 400 mg by mouth at bedtime.   sodium bicarbonate 650 MG tablet Take 1,300 mg by mouth 2 (two) times daily.   vitamin B-12 1000 MCG tablet Commonly known as:  CYANOCOBALAMIN Take 1,000 mcg by mouth daily.   ZANTAC 75 75 MG tablet Generic drug:  ranitidine Take 75 mg by mouth 2 (two) times daily as needed for indigestion.      Follow-up Information    [name], MD. Schedule an appointment as soon as possible for a visit in 1 week(s).   Specialty:  Family Medicine Contact information: [address] [place] [address] [phone]          No Known Allergies  Consultations:   None   Other Procedures/Studies: Ct Head Wo Contrast  Result Date: 06/15/2018 CLINICAL DATA:  Altered mental status.  Right-sided facial droop EXAM: CT HEAD WITHOUT CONTRAST TECHNIQUE: Contiguous axial images were obtained from the base of the skull through  the vertex without intravenous contrast. COMPARISON:  Head CT August 04, 2016; limited brain MRI August 06, 2016 FINDINGS: Brain: The ventricles are normal in size and configuration. There is no intracranial mass, hemorrhage, extra-axial fluid collection, or midline shift. Brain parenchyma appears unremarkable. No acute infarct evident. Foci of basal ganglia calcification is felt to be physiologic. Vascular: There is no evident hyperdense vessel. There are foci of calcification in each carotid siphon region. Skull: The bony calvarium appears intact. Sinuses/Orbits: There is mild mucosal thickening in several ethmoid air cells. Other visualized paranasal sinuses are clear. Visualized orbits appear symmetric bilaterally. Other: Mastoid air cells are clear. IMPRESSION: Brain parenchyma appears unremarkable. No mass or hemorrhage evident. There are foci of arterial vascular  calcification. There is mild mucosal thickening in several ethmoid air cells. Electronically Signed   By: Lowella Grip III M.D.   On: 06/15/2018 20:19   Ct Chest Wo Contrast  Result Date: 06/15/2018 CLINICAL DATA:  Worsening generalized weakness and cough for 1 week. EXAM: CT CHEST WITHOUT CONTRAST TECHNIQUE: Multidetector CT imaging of the chest was performed following the standard protocol without IV contrast. COMPARISON:  Portable chest obtained earlier today. Chest CT dated 08/11/2016. FINDINGS: Cardiovascular: Small pericardial effusion with a maximum thickness of 8 mm. Normal sized heart. Mediastinum/Nodes: No enlarged mediastinal or axillary lymph nodes. Thyroid gland, trachea, and esophagus demonstrate no significant findings. Lungs/Pleura: Extensive bilateral bullous changes and mild diffuse ground-glass opacity of the interstitial markings without significant change. No pleural fluid. Upper Abdomen: Unremarkable. Musculoskeletal: Minimal thoracic spine degenerative changes. IMPRESSION: 1. No acute abnormality. 2. 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 3. Decreased size of a small pericardial effusion. Emphysema (ICD10-J43.9). Electronically Signed   By: Claudie Revering M.D.   On: 06/15/2018 23:38   Mr Brain Wo Contrast  Result Date: 06/15/2018 CLINICAL DATA:  57 y/o F; patient presenting with altered mental status with confusion and slurred speech. History of CVA versus TIA, SLE, hypertension, heart failure, CKD stage 3. Cold symptoms with productive cough for 1 week. EXAM: MRI HEAD WITHOUT CONTRAST TECHNIQUE: Axial DWI, coronal DWI, axial T2 blade, axial T2 FLAIR blade, and axial susceptibility weighted sequences were acquired. COMPARISON:  06/15/2018 CT head.  08/06/2016 MRI head. FINDINGS: Brain: No acute infarction, hemorrhage, hydrocephalus, extra-axial collection or mass lesion. No structural or signal abnormality of the brain identified. Vascular: Normal flow  voids. Skull and upper cervical spine: Normal marrow signal. Sinuses/Orbits: Negative. Other: None. IMPRESSION: No acute intracranial abnormality identified. Unremarkable MRI of the brain. Electronically Signed   By: Kristine Garbe M.D.   On: 06/15/2018 23:12   Dg Chest Port 1 View  Result Date: 06/15/2018 CLINICAL DATA:  Altered mental status with hypoxia EXAM: PORTABLE CHEST 1 VIEW COMPARISON:  02/02/2018 FINDINGS: Mild cardiomegaly. Vascular congestion and diffuse interstitial opacities. No pleural effusion or pneumothorax. IMPRESSION: Mild cardiomegaly with vascular congestion. Diffuse interstitial opacity could reflect interstitial edema or interstitial inflammatory process. Electronically Signed   By: Donavan Foil M.D.   On: 06/15/2018 17:41     TODAY-DAY OF DISCHARGE:  Subjective:   Anna [name] today has no headache,no chest abdominal pain,no new weakness tingling or numbness, feels much better wants to go home today.   Objective:   Blood pressure (!) 146/85, pulse 79, temperature 98.1 F (36.7 C), temperature source Oral, resp. rate 18, height 5\' 6"  (1.676 m), weight 81.2 kg, last menstrual period 07/07/2002, SpO2 100 %.  Intake/Output Summary (Last 24 hours) at 06/18/2018 0858 Last data  filed at 06/17/2018 1300 Gross per 24 hour  Intake 240 ml  Output -  Net 240 ml   Filed Weights   06/16/18 1512  Weight: 81.2 kg    Exam: Awake Alert, Oriented *3, No new F.N deficits, Normal affect [place].AT,PERRAL Supple Neck,No JVD, No cervical lymphadenopathy appriciated.  Symmetrical Chest wall movement, Good air movement bilaterally, CTAB RRR,No Gallops,Rubs or new Murmurs, No Parasternal Heave +ve B.Sounds, Abd Soft, Non tender, No organomegaly appriciated, No rebound -guarding or rigidity. No Cyanosis, Clubbing or edema, No new Rash or bruise   PERTINENT RADIOLOGIC STUDIES: Ct Head Wo Contrast  Result Date: 06/15/2018 CLINICAL DATA:  Altered mental status.   Right-sided facial droop EXAM: CT HEAD WITHOUT CONTRAST TECHNIQUE: Contiguous axial images were obtained from the base of the skull through the vertex without intravenous contrast. COMPARISON:  Head CT August 04, 2016; limited brain MRI August 06, 2016 FINDINGS: Brain: The ventricles are normal in size and configuration. There is no intracranial mass, hemorrhage, extra-axial fluid collection, or midline shift. Brain parenchyma appears unremarkable. No acute infarct evident. Foci of basal ganglia calcification is felt to be physiologic. Vascular: There is no evident hyperdense vessel. There are foci of calcification in each carotid siphon region. Skull: The bony calvarium appears intact. Sinuses/Orbits: There is mild mucosal thickening in several ethmoid air cells. Other visualized paranasal sinuses are clear. Visualized orbits appear symmetric bilaterally. Other: Mastoid air cells are clear. IMPRESSION: Brain parenchyma appears unremarkable. No mass or hemorrhage evident. There are foci of arterial vascular calcification. There is mild mucosal thickening in several ethmoid air cells. Electronically Signed   By: Lowella Grip III M.D.   On: 06/15/2018 20:19   Ct Chest Wo Contrast  Result Date: 06/15/2018 CLINICAL DATA:  Worsening generalized weakness and cough for 1 week. EXAM: CT CHEST WITHOUT CONTRAST TECHNIQUE: Multidetector CT imaging of the chest was performed following the standard protocol without IV contrast. COMPARISON:  Portable chest obtained earlier today. Chest CT dated 08/11/2016. FINDINGS: Cardiovascular: Small pericardial effusion with a maximum thickness of 8 mm. Normal sized heart. Mediastinum/Nodes: No enlarged mediastinal or axillary lymph nodes. Thyroid gland, trachea, and esophagus demonstrate no significant findings. Lungs/Pleura: Extensive bilateral bullous changes and mild diffuse ground-glass opacity of the interstitial markings without significant change. No pleural fluid. Upper  Abdomen: Unremarkable. Musculoskeletal: Minimal thoracic spine degenerative changes. IMPRESSION: 1. No acute abnormality. 2. 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 3. Decreased size of a small pericardial effusion. Emphysema (ICD10-J43.9). Electronically Signed   By: Claudie Revering M.D.   On: 06/15/2018 23:38   Mr Brain Wo Contrast  Result Date: 06/15/2018 CLINICAL DATA:  57 y/o F; patient presenting with altered mental status with confusion and slurred speech. History of CVA versus TIA, SLE, hypertension, heart failure, CKD stage 3. Cold symptoms with productive cough for 1 week. EXAM: MRI HEAD WITHOUT CONTRAST TECHNIQUE: Axial DWI, coronal DWI, axial T2 blade, axial T2 FLAIR blade, and axial susceptibility weighted sequences were acquired. COMPARISON:  06/15/2018 CT head.  08/06/2016 MRI head. FINDINGS: Brain: No acute infarction, hemorrhage, hydrocephalus, extra-axial collection or mass lesion. No structural or signal abnormality of the brain identified. Vascular: Normal flow voids. Skull and upper cervical spine: Normal marrow signal. Sinuses/Orbits: Negative. Other: None. IMPRESSION: No acute intracranial abnormality identified. Unremarkable MRI of the brain. Electronically Signed   By: Kristine Garbe M.D.   On: 06/15/2018 23:12   Dg Chest Port 1 View  Result Date: 06/15/2018 CLINICAL DATA:  Altered mental status  with hypoxia EXAM: PORTABLE CHEST 1 VIEW COMPARISON:  02/02/2018 FINDINGS: Mild cardiomegaly. Vascular congestion and diffuse interstitial opacities. No pleural effusion or pneumothorax. IMPRESSION: Mild cardiomegaly with vascular congestion. Diffuse interstitial opacity could reflect interstitial edema or interstitial inflammatory process. Electronically Signed   By: Donavan Foil M.D.   On: 06/15/2018 17:41     PERTINENT LAB RESULTS: CBC: Recent Labs    06/15/18 1638  WBC 4.7  HGB 10.1*  HCT 27.9*  PLT 221   CMET CMP     Component Value  Date/Time   NA 145 06/18/2018 0348   K 3.9 06/18/2018 0348   CL 116 (H) 06/18/2018 0348   CO2 20 (L) 06/18/2018 0348   GLUCOSE 92 06/18/2018 0348   BUN 19 06/18/2018 0348   CREATININE 1.59 (H) 06/18/2018 0348   CREATININE 1.17 (H) 01/10/2013 1234   CALCIUM 8.4 (L) 06/18/2018 0348   PROT 7.4 06/15/2018 1638   ALBUMIN 3.7 06/15/2018 1638   AST 41 06/15/2018 1638   ALT 18 06/15/2018 1638   ALKPHOS 77 06/15/2018 1638   BILITOT 0.5 06/15/2018 1638   GFRNONAA 35 (L) 06/18/2018 0348   GFRAA 41 (L) 06/18/2018 0348    GFR Estimated Creatinine Clearance: 41.5 mL/min (A) (by C-G formula based on SCr of 1.59 mg/dL (H)). No results for input(s): LIPASE, AMYLASE in the last 72 hours. Recent Labs    06/15/18 2118 06/16/18 0307 06/16/18 1835  TROPONINI 0.03* 0.04* <0.03   Invalid input(s): POCBNP No results for input(s): DDIMER in the last 72 hours. No results for input(s): HGBA1C in the last 72 hours. No results for input(s): CHOL, HDL, LDLCALC, TRIG, CHOLHDL, LDLDIRECT in the last 72 hours. Recent Labs    06/17/18 0426  TSH 0.410   No results for input(s): VITAMINB12, FOLATE, FERRITIN, TIBC, IRON, RETICCTPCT in the last 72 hours. Coags: No results for input(s): INR in the last 72 hours.  Invalid input(s): PT Microbiology: No results found for this or any previous visit (from the past 240 hour(s)).  FURTHER DISCHARGE INSTRUCTIONS:  Get Medicines reviewed and adjusted: Please take all your medications with you for your next visit with your Primary MD  Laboratory/radiological data: Please request your Primary MD to go over all hospital tests and procedure/radiological results at the follow up, please ask your Primary MD to get all Hospital records sent to his/her office.  In some cases, they will be blood work, cultures and biopsy results pending at the time of your discharge. Please request that your primary care M.D. goes through all the records of your hospital data and  follows up on these results.  Also Note the following: If you experience worsening of your admission symptoms, develop shortness of breath, life threatening emergency, suicidal or homicidal thoughts you must seek medical attention immediately by calling 911 or calling your MD immediately  if symptoms less severe.  You must read complete instructions/literature along with all the possible adverse reactions/side effects for all the Medicines you take and that have been prescribed to you. Take any new Medicines after you have completely understood and accpet all the possible adverse reactions/side effects.   Do not drive when taking Pain medications or sleeping medications (Benzodaizepines)  Do not take more than prescribed Pain, Sleep and Anxiety Medications. It is not advisable to combine anxiety,sleep and pain medications without talking with your primary care practitioner  Special Instructions: If you have smoked or chewed Tobacco  in the last 2 yrs please stop smoking, stop any  regular Alcohol  and or any Recreational drug use.  Wear Seat belts while driving.  Please note: You were cared for by a hospitalist during your hospital stay. Once you are discharged, your primary care physician will handle any further medical issues. Please note that NO REFILLS for any discharge medications will be authorized once you are discharged, as it is imperative that you return to your primary care physician (or establish a relationship with a primary care physician if you do not have one) for your post hospital discharge needs so that they can reassess your need for medications and monitor your lab values.  Total Time spent coordinating discharge including counseling, education and face to face time equals 35 minutes.  SignedOren Binet 06/18/2018 8:58 AM

## 2018-06-18 NOTE — Progress Notes (Signed)
Pt given discharge instructions, prescriptions, and care notes. Pt verbalized understanding AEB no further questions or concerns at this time. IV was discontinued, no redness, pain, or swelling noted at this time. Skin dry and intact. Pt left the floor via wheelchair with staff in stable condition.

## 2018-07-20 ENCOUNTER — Ambulatory Visit: Payer: BLUE CROSS/BLUE SHIELD | Admitting: Physician Assistant

## 2018-07-20 ENCOUNTER — Encounter: Payer: Self-pay | Admitting: Physician Assistant

## 2018-07-20 DIAGNOSIS — F411 Generalized anxiety disorder: Secondary | ICD-10-CM

## 2018-07-20 DIAGNOSIS — F319 Bipolar disorder, unspecified: Secondary | ICD-10-CM | POA: Diagnosis not present

## 2018-07-20 MED ORDER — DIAZEPAM 10 MG PO TABS: 10.0000 mg | ORAL_TABLET | Freq: Three times a day (TID) | ORAL | 2 refills | Status: DC | PRN

## 2018-07-20 MED ORDER — ESCITALOPRAM OXALATE 10 MG PO TABS: 30.0000 mg | ORAL_TABLET | Freq: Every day | ORAL | 0 refills | Status: DC

## 2018-07-20 MED ORDER — QUETIAPINE FUMARATE 400 MG PO TABS: 400.0000 mg | ORAL_TABLET | Freq: Every day | ORAL | 0 refills | Status: DC

## 2018-07-20 NOTE — Progress Notes (Signed)
[facility]  Patient ID: Anna [name],  MRN: [id number]  PCP: [name], MD  Date of Evaluation: 07/20/2018 Time spent:15 minutes  Chief Complaint:  Chief Complaint    Anxiety; Depression; Follow-up      HISTORY/CURRENT STATUS: HPI Overdue for med check.  Has been in hospital several times since LOV.  In [place] at [place], then [place], then most recently at [facility]. (See hospital notes)   States she feels like her medications are working.  Especially the Valium.  She is really anxious without it.  It does help to keep her calm in general but really helps when she has a panic attack which can be as much as once a day.  Patient denies loss of interest in usual activities and is able to enjoy things.  Denies decreased energy or motivation.  Appetite has not changed.  No extreme sadness, tearfulness, or feelings of hopelessness.  Denies any changes in concentration, making decisions or remembering things.  Denies suicidal or homicidal thoughts.  Patient denies increased energy with decreased need for sleep, no increased talkativeness, no racing thoughts, no impulsivity or risky behaviors, no increased spending, no increased libido, no grandiosity.  Individual Medical History/ Review of Systems: Changes? :Yes Hospitalization at [facility] in December for acute encephalopathy, metabolic acidosis secondary to renal function, chronic kidney disease stage III, questionable hypoxic respiratory failure, hypertension  Allergies: Patient has no known allergies.  Current Medications:  Current Outpatient Medications:  .  amLODipine (NORVASC) 10 MG tablet, Take 10 mg by mouth daily., Disp: , Rfl: 1 .  aspirin EC 81 MG tablet, Take 1 tablet (81 mg total) by mouth daily., Disp: 150 tablet, Rfl: 2 .  atorvastatin (LIPITOR) 20 MG tablet, Take 20 mg by mouth daily., Disp: , Rfl:  .  azaTHIOprine (IMURAN) 50 MG tablet, Take 50 mg by mouth 2 (two) times daily. , Disp: , Rfl: 0 .   calcitRIOL (ROCALTROL) 0.25 MCG capsule, Take 0.25 mcg by mouth daily., Disp: , Rfl: 6 .  diazepam (VALIUM) 10 MG tablet, Take 1 tablet (10 mg total) by mouth every 8 (eight) hours as needed for anxiety., Disp: 90 tablet, Rfl: 2 .  escitalopram (LEXAPRO) 10 MG tablet, Take 3 tablets (30 mg total) by mouth daily., Disp: 270 tablet, Rfl: 0 .  folic acid (FOLVITE) 1 MG tablet, Take 1 mg by mouth daily., Disp: , Rfl:  .  hydroxychloroquine (PLAQUENIL) 200 MG tablet, Take 200 mg by mouth daily. , Disp: , Rfl: 0 .  levothyroxine (SYNTHROID, LEVOTHROID) 50 MCG tablet, Take 50 mcg by mouth daily before breakfast., Disp: , Rfl:  .  metoprolol succinate (TOPROL-XL) 50 MG 24 hr tablet, Take 50 mg by mouth daily. Take with or immediately following a meal., Disp: , Rfl:  .  omeprazole (PRILOSEC) 20 MG capsule, Take 20 mg by mouth daily., Disp: , Rfl:  .  ondansetron (ZOFRAN) 4 MG tablet, Take 1 tablet (4 mg total) by mouth every 6 (six) hours as needed for nausea., Disp: 20 tablet, Rfl: 0 .  polyethylene glycol (MIRALAX / GLYCOLAX) packet, Take 17 g by mouth 2 (two) times daily., Disp: , Rfl: 0 .  predniSONE (DELTASONE) 10 MG tablet, Take 10 mg by mouth daily with breakfast., Disp: , Rfl:  .  psyllium (METAMUCIL SMOOTH TEXTURE) 58.6 % powder, Take 1 packet by mouth 3 (three) times daily., Disp: , Rfl:  .  QUEtiapine (SEROQUEL) 400 MG tablet, Take 1 tablet (400 mg total) by mouth at  bedtime., Disp: 90 tablet, Rfl: 0 .  ranitidine (ZANTAC 75) 75 MG tablet, Take 75 mg by mouth 2 (two) times daily as needed for indigestion., Disp: , Rfl:  .  sodium bicarbonate 650 MG tablet, Take 1,300 mg by mouth 2 (two) times daily., Disp: , Rfl:  .  vitamin B-12 (CYANOCOBALAMIN) 1000 MCG tablet, Take 1,000 mcg by mouth daily., Disp: , Rfl:  .  diphenhydrAMINE (BENADRYL) 25 MG tablet, Take 25 mg by mouth at bedtime., Disp: , Rfl:  Medication Side Effects: none  Family Medical/ Social History: Changes? No  MENTAL HEALTH  EXAM:  Last menstrual period 07/07/2002.There is no height or weight on file to calculate BMI.  General Appearance: Casual and Well Groomed  Eye Contact:  Good  Speech:  Clear and Coherent  Volume:  Normal  Mood:  Euthymic she is not as bright and happy as usual, making jokes and cutting up.  Affect:  Flat  Thought Process:  Goal Directed but thinks slower than she normally does.  Orientation:  Full (Time, Place, and Person)  Thought Content: Logical   Suicidal Thoughts:  No  Homicidal Thoughts:  No  Memory:  Immediate;   Fair Recent;   Fair Remote;   Fair  Judgement:  Fair  Insight:  Fair  Psychomotor Activity:  walks slow, is weak  Concentration:  Concentration: Fair and Attention Span: Fair  Recall:  [employer] of Knowledge: Fair  Language: Good  Assets:  Desire for Improvement  ADL's:  Intact  Cognition: fair to good  Prognosis:  Fair    DIAGNOSES:    ICD-10-CM   1. Bipolar I disorder ([place]) F31.9   2. Generalized anxiety disorder F41.1     Receiving Psychotherapy: No    RECOMMENDATIONS: As far as her anxiety and bipolar disorder goes, I believe she is stable.  No changes will be made in her meds. Continue Lexapro 30 mg p.o. daily. Continue Valium 10 mg p.o. 3 times daily PRN. Continue Seroquel 400 mg nightly. Return in 3 months or sooner as needed.    Donnal Moat, PA-C

## 2018-07-23 ENCOUNTER — Emergency Department (HOSPITAL_COMMUNITY): Payer: BLUE CROSS/BLUE SHIELD

## 2018-07-23 ENCOUNTER — Inpatient Hospital Stay (HOSPITAL_COMMUNITY)
Admission: EM | Admit: 2018-07-23 | Discharge: 2018-08-02 | DRG: 682 | Disposition: A | Discharge status: Skilled Nursing Facility | Payer: BLUE CROSS/BLUE SHIELD | Attending: Internal Medicine | Admitting: Internal Medicine

## 2018-07-23 ENCOUNTER — Encounter (HOSPITAL_COMMUNITY): Payer: Self-pay | Admitting: Internal Medicine

## 2018-07-23 ENCOUNTER — Other Ambulatory Visit: Payer: Self-pay

## 2018-07-23 DIAGNOSIS — J440 Chronic obstructive pulmonary disease with acute lower respiratory infection: Secondary | ICD-10-CM | POA: Diagnosis present

## 2018-07-23 DIAGNOSIS — R4182 Altered mental status, unspecified: Secondary | ICD-10-CM | POA: Diagnosis not present

## 2018-07-23 DIAGNOSIS — Z7982 Long term (current) use of aspirin: Secondary | ICD-10-CM

## 2018-07-23 DIAGNOSIS — R401 Stupor: Secondary | ICD-10-CM

## 2018-07-23 DIAGNOSIS — I5032 Chronic diastolic (congestive) heart failure: Secondary | ICD-10-CM | POA: Diagnosis not present

## 2018-07-23 DIAGNOSIS — D509 Iron deficiency anemia, unspecified: Secondary | ICD-10-CM | POA: Diagnosis present

## 2018-07-23 DIAGNOSIS — Z8249 Family history of ischemic heart disease and other diseases of the circulatory system: Secondary | ICD-10-CM

## 2018-07-23 DIAGNOSIS — F319 Bipolar disorder, unspecified: Secondary | ICD-10-CM | POA: Diagnosis present

## 2018-07-23 DIAGNOSIS — Z90710 Acquired absence of both cervix and uterus: Secondary | ICD-10-CM

## 2018-07-23 DIAGNOSIS — F17210 Nicotine dependence, cigarettes, uncomplicated: Secondary | ICD-10-CM | POA: Diagnosis present

## 2018-07-23 DIAGNOSIS — I10 Essential (primary) hypertension: Secondary | ICD-10-CM | POA: Diagnosis present

## 2018-07-23 DIAGNOSIS — I130 Hypertensive heart and chronic kidney disease with heart failure and stage 1 through stage 4 chronic kidney disease, or unspecified chronic kidney disease: Secondary | ICD-10-CM | POA: Diagnosis present

## 2018-07-23 DIAGNOSIS — J441 Chronic obstructive pulmonary disease with (acute) exacerbation: Secondary | ICD-10-CM | POA: Diagnosis present

## 2018-07-23 DIAGNOSIS — M329 Systemic lupus erythematosus, unspecified: Secondary | ICD-10-CM | POA: Diagnosis present

## 2018-07-23 DIAGNOSIS — E785 Hyperlipidemia, unspecified: Secondary | ICD-10-CM | POA: Diagnosis present

## 2018-07-23 DIAGNOSIS — Z87442 Personal history of urinary calculi: Secondary | ICD-10-CM

## 2018-07-23 DIAGNOSIS — N179 Acute kidney failure, unspecified: Principal | ICD-10-CM | POA: Diagnosis present

## 2018-07-23 DIAGNOSIS — Z8673 Personal history of transient ischemic attack (TIA), and cerebral infarction without residual deficits: Secondary | ICD-10-CM

## 2018-07-23 DIAGNOSIS — Z79899 Other long term (current) drug therapy: Secondary | ICD-10-CM

## 2018-07-23 DIAGNOSIS — Z7952 Long term (current) use of systemic steroids: Secondary | ICD-10-CM

## 2018-07-23 DIAGNOSIS — R404 Transient alteration of awareness: Secondary | ICD-10-CM

## 2018-07-23 DIAGNOSIS — IMO0002 Reserved for concepts with insufficient information to code with codable children: Secondary | ICD-10-CM | POA: Diagnosis present

## 2018-07-23 DIAGNOSIS — Z9114 Patient's other noncompliance with medication regimen: Secondary | ICD-10-CM

## 2018-07-23 DIAGNOSIS — E872 Acidosis: Secondary | ICD-10-CM | POA: Diagnosis present

## 2018-07-23 DIAGNOSIS — N170 Acute kidney failure with tubular necrosis: Secondary | ICD-10-CM | POA: Diagnosis not present

## 2018-07-23 DIAGNOSIS — D539 Nutritional anemia, unspecified: Secondary | ICD-10-CM | POA: Diagnosis present

## 2018-07-23 DIAGNOSIS — F419 Anxiety disorder, unspecified: Secondary | ICD-10-CM | POA: Diagnosis present

## 2018-07-23 DIAGNOSIS — Z833 Family history of diabetes mellitus: Secondary | ICD-10-CM

## 2018-07-23 DIAGNOSIS — N183 Chronic kidney disease, stage 3 unspecified: Secondary | ICD-10-CM | POA: Diagnosis present

## 2018-07-23 DIAGNOSIS — N2589 Other disorders resulting from impaired renal tubular function: Secondary | ICD-10-CM | POA: Diagnosis present

## 2018-07-23 DIAGNOSIS — G4733 Obstructive sleep apnea (adult) (pediatric): Secondary | ICD-10-CM | POA: Diagnosis present

## 2018-07-23 DIAGNOSIS — D631 Anemia in chronic kidney disease: Secondary | ICD-10-CM | POA: Diagnosis present

## 2018-07-23 DIAGNOSIS — G9341 Metabolic encephalopathy: Secondary | ICD-10-CM | POA: Diagnosis present

## 2018-07-23 DIAGNOSIS — E869 Volume depletion, unspecified: Secondary | ICD-10-CM | POA: Diagnosis not present

## 2018-07-23 DIAGNOSIS — J189 Pneumonia, unspecified organism: Secondary | ICD-10-CM | POA: Diagnosis present

## 2018-07-23 DIAGNOSIS — J9601 Acute respiratory failure with hypoxia: Secondary | ICD-10-CM

## 2018-07-23 DIAGNOSIS — E039 Hypothyroidism, unspecified: Secondary | ICD-10-CM | POA: Diagnosis present

## 2018-07-23 LAB — CBC WITH DIFFERENTIAL/PLATELET
Abs Immature Granulocytes: 0.03 10*3/uL (ref 0.00–0.07)
BASOS PCT: 1 %
Basophils Absolute: 0 10*3/uL (ref 0.0–0.1)
Eosinophils Absolute: 0.1 10*3/uL (ref 0.0–0.5)
Eosinophils Relative: 1 %
HCT: 26.3 % — ABNORMAL LOW (ref 36.0–46.0)
Hemoglobin: 9 g/dL — ABNORMAL LOW (ref 12.0–15.0)
IMMATURE GRANULOCYTES: 1 %
Lymphocytes Relative: 13 %
Lymphs Abs: 0.6 10*3/uL — ABNORMAL LOW (ref 0.7–4.0)
MCH: 42.3 pg — ABNORMAL HIGH (ref 26.0–34.0)
MCHC: 34.2 g/dL (ref 30.0–36.0)
MCV: 123.5 fL — ABNORMAL HIGH (ref 80.0–100.0)
Monocytes Absolute: 0.4 10*3/uL (ref 0.1–1.0)
Monocytes Relative: 9 %
Neutro Abs: 3.7 10*3/uL (ref 1.7–7.7)
Neutrophils Relative %: 75 %
Platelets: 423 10*3/uL — ABNORMAL HIGH (ref 150–400)
RBC: 2.13 MIL/uL — ABNORMAL LOW (ref 3.87–5.11)
RDW: 26.4 % — AB (ref 11.5–15.5)
WBC: 4.9 10*3/uL (ref 4.0–10.5)
nRBC: 0 % (ref 0.0–0.2)

## 2018-07-23 LAB — RETICULOCYTES
Immature Retic Fract: 27.4 % — ABNORMAL HIGH (ref 2.3–15.9)
RBC.: 1.82 MIL/uL — ABNORMAL LOW (ref 3.87–5.11)
Retic Count, Absolute: 106.7 10*3/uL (ref 19.0–186.0)
Retic Ct Pct: 5.9 % — ABNORMAL HIGH (ref 0.4–3.1)

## 2018-07-23 LAB — COMPREHENSIVE METABOLIC PANEL
ALK PHOS: 63 U/L (ref 38–126)
ALT: 17 U/L (ref 0–44)
AST: 30 U/L (ref 15–41)
Albumin: 3.9 g/dL (ref 3.5–5.0)
Anion gap: 12 (ref 5–15)
BUN: 34 mg/dL — ABNORMAL HIGH (ref 6–20)
CALCIUM: 9 mg/dL (ref 8.9–10.3)
CHLORIDE: 118 mmol/L — AB (ref 98–111)
CO2: 14 mmol/L — AB (ref 22–32)
CREATININE: 2.57 mg/dL — AB (ref 0.44–1.00)
GFR calc Af Amer: 23 mL/min — ABNORMAL LOW (ref >60)
GFR calc non Af Amer: 20 mL/min — ABNORMAL LOW (ref >60)
Glucose, Bld: 109 mg/dL — ABNORMAL HIGH (ref 70–99)
Potassium: 5.1 mmol/L (ref 3.5–5.1)
Sodium: 144 mmol/L (ref 135–145)
Total Bilirubin: 0.3 mg/dL (ref 0.3–1.2)
Total Protein: 7.4 g/dL (ref 6.5–8.1)

## 2018-07-23 LAB — CBG MONITORING, ED: Glucose-Capillary: 114 mg/dL — ABNORMAL HIGH (ref 70–99)

## 2018-07-23 LAB — RAPID URINE DRUG SCREEN, HOSP PERFORMED
Amphetamines: NOT DETECTED
Barbiturates: NOT DETECTED
Benzodiazepines: POSITIVE — AB
Cocaine: NOT DETECTED
Opiates: NOT DETECTED
Tetrahydrocannabinol: NOT DETECTED

## 2018-07-23 LAB — FERRITIN: Ferritin: 130 ng/mL (ref 11–307)

## 2018-07-23 LAB — URINALYSIS, ROUTINE W REFLEX MICROSCOPIC
Bacteria, UA: NONE SEEN
Bilirubin Urine: NEGATIVE
Glucose, UA: NEGATIVE mg/dL
Ketones, ur: NEGATIVE mg/dL
Leukocytes, UA: NEGATIVE
Nitrite: NEGATIVE
Protein, ur: 30 mg/dL — AB
Specific Gravity, Urine: 1.015 (ref 1.005–1.030)
pH: 5 (ref 5.0–8.0)

## 2018-07-23 LAB — I-STAT VENOUS BLOOD GAS, ED
Acid-base deficit: 12 mmol/L — ABNORMAL HIGH (ref 0.0–2.0)
Bicarbonate: 14.6 mmol/L — ABNORMAL LOW (ref 20.0–28.0)
O2 Saturation: 73 %
TCO2: 16 mmol/L — AB (ref 22–32)
pCO2, Ven: 35.8 mmHg — ABNORMAL LOW (ref 44.0–60.0)
pH, Ven: 7.218 — ABNORMAL LOW (ref 7.250–7.430)
pO2, Ven: 46 mmHg — ABNORMAL HIGH (ref 32.0–45.0)

## 2018-07-23 LAB — AMMONIA: Ammonia: 20 umol/L (ref 9–35)

## 2018-07-23 LAB — I-STAT CHEM 8, ED
BUN: 33 mg/dL — ABNORMAL HIGH (ref 6–20)
Calcium, Ion: 1.19 mmol/L (ref 1.15–1.40)
Chloride: 124 mmol/L — ABNORMAL HIGH (ref 98–111)
Creatinine, Ser: 2.8 mg/dL — ABNORMAL HIGH (ref 0.44–1.00)
Glucose, Bld: 103 mg/dL — ABNORMAL HIGH (ref 70–99)
HCT: 26 % — ABNORMAL LOW (ref 36.0–46.0)
HEMOGLOBIN: 8.8 g/dL — AB (ref 12.0–15.0)
Potassium: 5.1 mmol/L (ref 3.5–5.1)
Sodium: 144 mmol/L (ref 135–145)
TCO2: 16 mmol/L — ABNORMAL LOW (ref 22–32)

## 2018-07-23 LAB — ACETAMINOPHEN LEVEL: Acetaminophen (Tylenol), Serum: <10 ug/mL — ABNORMAL LOW (ref 10–30)

## 2018-07-23 LAB — I-STAT CG4 LACTIC ACID, ED: Lactic Acid, Venous: 0.76 mmol/L (ref 0.5–1.9)

## 2018-07-23 LAB — ETHANOL: Alcohol, Ethyl (B): <10 mg/dL (ref <10)

## 2018-07-23 LAB — SALICYLATE LEVEL: Salicylate Lvl: 19.7 mg/dL (ref 2.8–30.0)

## 2018-07-23 LAB — IRON AND TIBC
Iron: 39 ug/dL (ref 28–170)
Saturation Ratios: 14 % (ref 10.4–31.8)
TIBC: 284 ug/dL (ref 250–450)
UIBC: 245 ug/dL

## 2018-07-23 LAB — VITAMIN B12: Vitamin B-12: 359 pg/mL (ref 180–914)

## 2018-07-23 MED ORDER — CALCITRIOL 0.25 MCG PO CAPS
0.2500 ug | ORAL_CAPSULE | Freq: Every day | ORAL | Status: DC
  Administered 2018-07-24 – 2018-08-02 (×10): 0.25 ug via ORAL
  Filled 2018-07-23 (×10): qty 1

## 2018-07-23 MED ORDER — PREDNISONE 5 MG PO TABS
10.0000 mg | ORAL_TABLET | Freq: Every day | ORAL | Status: DC
  Administered 2018-07-24 – 2018-07-25 (×2): 10 mg via ORAL
  Filled 2018-07-23: qty 1
  Filled 2018-07-23: qty 2

## 2018-07-23 MED ORDER — ACETAMINOPHEN 650 MG RE SUPP: 650.0000 mg | Freq: Four times a day (QID) | RECTAL | Status: DC | PRN

## 2018-07-23 MED ORDER — PANTOPRAZOLE SODIUM 40 MG PO TBEC
40.0000 mg | DELAYED_RELEASE_TABLET | Freq: Every day | ORAL | Status: DC
  Administered 2018-07-24 – 2018-08-02 (×10): 40 mg via ORAL
  Filled 2018-07-23 (×10): qty 1

## 2018-07-23 MED ORDER — POLYETHYLENE GLYCOL 3350 17 G PO PACK
17.0000 g | PACK | Freq: Two times a day (BID) | ORAL | Status: DC
  Administered 2018-07-23 – 2018-08-02 (×10): 17 g via ORAL
  Filled 2018-07-23 (×17): qty 1

## 2018-07-23 MED ORDER — AMLODIPINE BESYLATE 10 MG PO TABS
10.0000 mg | ORAL_TABLET | Freq: Every day | ORAL | Status: DC
  Administered 2018-07-24 – 2018-07-30 (×7): 10 mg via ORAL
  Filled 2018-07-23 (×7): qty 1

## 2018-07-23 MED ORDER — AZATHIOPRINE 50 MG PO TABS
50.0000 mg | ORAL_TABLET | Freq: Two times a day (BID) | ORAL | Status: DC
  Administered 2018-07-23 – 2018-08-02 (×20): 50 mg via ORAL
  Filled 2018-07-23 (×22): qty 1

## 2018-07-23 MED ORDER — LACTATED RINGERS IV SOLN
INTRAVENOUS | Status: DC
  Administered 2018-07-23 – 2018-07-29 (×10): via INTRAVENOUS

## 2018-07-23 MED ORDER — METOPROLOL SUCCINATE ER 25 MG PO TB24
50.0000 mg | ORAL_TABLET | Freq: Every day | ORAL | Status: DC
  Administered 2018-07-24 – 2018-08-02 (×10): 50 mg via ORAL
  Filled 2018-07-23 (×7): qty 2
  Filled 2018-07-23: qty 1
  Filled 2018-07-23 (×2): qty 2

## 2018-07-23 MED ORDER — DOCUSATE SODIUM 100 MG PO CAPS
100.0000 mg | ORAL_CAPSULE | Freq: Two times a day (BID) | ORAL | Status: DC
  Administered 2018-07-23 – 2018-08-02 (×17): 100 mg via ORAL
  Filled 2018-07-23 (×20): qty 1

## 2018-07-23 MED ORDER — QUETIAPINE FUMARATE 400 MG PO TABS
400.0000 mg | ORAL_TABLET | Freq: Every day | ORAL | Status: DC
  Administered 2018-07-23 – 2018-07-25 (×3): 400 mg via ORAL
  Filled 2018-07-23 (×5): qty 1
  Filled 2018-07-23: qty 8

## 2018-07-23 MED ORDER — SODIUM CHLORIDE 0.9 % IV BOLUS
1000.0000 mL | Freq: Once | INTRAVENOUS | Status: AC
  Administered 2018-07-23: 1000 mL via INTRAVENOUS

## 2018-07-23 MED ORDER — HYDROXYCHLOROQUINE SULFATE 200 MG PO TABS
200.0000 mg | ORAL_TABLET | Freq: Every day | ORAL | Status: DC
  Administered 2018-07-24 – 2018-08-02 (×10): 200 mg via ORAL
  Filled 2018-07-23 (×10): qty 1

## 2018-07-23 MED ORDER — SODIUM BICARBONATE 8.4 % IV SOLN
100.0000 meq | Freq: Once | INTRAVENOUS | Status: AC
  Administered 2018-07-23: 100 meq via INTRAVENOUS
  Filled 2018-07-23: qty 50
  Filled 2018-07-23: qty 100

## 2018-07-23 MED ORDER — ASPIRIN EC 81 MG PO TBEC
81.0000 mg | DELAYED_RELEASE_TABLET | Freq: Every day | ORAL | Status: DC
  Administered 2018-07-24 – 2018-08-02 (×10): 81 mg via ORAL
  Filled 2018-07-23 (×10): qty 1

## 2018-07-23 MED ORDER — SODIUM BICARBONATE 650 MG PO TABS
1300.0000 mg | ORAL_TABLET | Freq: Two times a day (BID) | ORAL | Status: DC
  Administered 2018-07-24 – 2018-08-02 (×19): 1300 mg via ORAL
  Filled 2018-07-23 (×19): qty 2

## 2018-07-23 MED ORDER — LEVOTHYROXINE SODIUM 50 MCG PO TABS
50.0000 ug | ORAL_TABLET | Freq: Every day | ORAL | Status: DC
  Administered 2018-07-24 – 2018-08-02 (×10): 50 ug via ORAL
  Filled 2018-07-23 (×10): qty 1

## 2018-07-23 MED ORDER — ACETAMINOPHEN 325 MG PO TABS
650.0000 mg | ORAL_TABLET | Freq: Four times a day (QID) | ORAL | Status: DC | PRN
  Administered 2018-07-25 – 2018-07-29 (×4): 650 mg via ORAL
  Filled 2018-07-23 (×5): qty 2

## 2018-07-23 MED ORDER — ONDANSETRON HCL 4 MG PO TABS
4.0000 mg | ORAL_TABLET | Freq: Four times a day (QID) | ORAL | Status: DC | PRN
  Administered 2018-07-30: 4 mg via ORAL
  Filled 2018-07-23: qty 1

## 2018-07-23 MED ORDER — ESCITALOPRAM OXALATE 10 MG PO TABS
30.0000 mg | ORAL_TABLET | Freq: Every day | ORAL | Status: DC
  Administered 2018-07-24 – 2018-08-02 (×10): 30 mg via ORAL
  Filled 2018-07-23 (×6): qty 3
  Filled 2018-07-23: qty 1
  Filled 2018-07-23 (×3): qty 3

## 2018-07-23 MED ORDER — ENOXAPARIN SODIUM 40 MG/0.4ML ~~LOC~~ SOLN
40.0000 mg | SUBCUTANEOUS | Status: DC
  Administered 2018-07-23 – 2018-07-25 (×3): 40 mg via SUBCUTANEOUS
  Filled 2018-07-23 (×3): qty 0.4

## 2018-07-23 MED ORDER — FOLIC ACID 1 MG PO TABS
1.0000 mg | ORAL_TABLET | Freq: Every day | ORAL | Status: DC
  Administered 2018-07-24 – 2018-08-02 (×10): 1 mg via ORAL
  Filled 2018-07-23 (×10): qty 1

## 2018-07-23 MED ORDER — ONDANSETRON HCL 4 MG/2ML IJ SOLN
4.0000 mg | Freq: Four times a day (QID) | INTRAMUSCULAR | Status: DC | PRN
  Administered 2018-07-25: 4 mg via INTRAVENOUS
  Filled 2018-07-23: qty 2

## 2018-07-23 MED ORDER — ATORVASTATIN CALCIUM 10 MG PO TABS
20.0000 mg | ORAL_TABLET | Freq: Every day | ORAL | Status: DC
  Administered 2018-07-24 – 2018-08-02 (×10): 20 mg via ORAL
  Filled 2018-07-23 (×10): qty 2

## 2018-07-23 MED ORDER — VITAMIN B-12 1000 MCG PO TABS
1000.0000 ug | ORAL_TABLET | Freq: Every day | ORAL | Status: DC
  Administered 2018-07-24 – 2018-08-02 (×10): 1000 ug via ORAL
  Filled 2018-07-23 (×9): qty 1

## 2018-07-23 NOTE — Progress Notes (Signed)
RN and tech helped patient to bedside toilet pt was a 2 assist very unsteady on feet pt also confused trying to grab at things that are not there. Pt placed back in bed bed alarm on.

## 2018-07-23 NOTE — ED Notes (Signed)
Contact Information Ludwig Clarks [place]) Husband [phone]

## 2018-07-23 NOTE — H&P (Signed)
History and Physical    Anna [name] DUK:[id number] DOB: 05-15-1961 DOA: 07/23/2018  PCP: [name], MD Consultants:  Adelene Idler, [place] - Psychiatry; [facility] - pulmonology Patient coming from:  Home - lives with husband; NOK: Annia Friendly, [place]  Chief Complaint: AMS  HPI: Anna [name] is a 58 y.o. female with medical history significant of CVA: HLD; SLE; hypothyroidism; HTN; bipolar d/o;  and CHF presenting with AMS.  "I wasn't.  I just said, um, supposed to have taken some pills.  Dr. Orinda Kenner, specialist, and yesterday I didn't.  So he told me to come back today.  So I didn't want to, nothing, and see what's going on....  I said I did it again."  She was in 3 hospitals last year for the same presentation.  Her husband talked to her this AM and she wasn't too coherent this morning, he thinks maybe a couple of days of symptoms.  Her last normal was about 12pm one day this week but that evening she wasn't able to talk again, "in a zone."  Family thinks it is associated with her medications.  Her sister is concerned about overdosing.  Family is unable to monitor or distribute medication.  She was admitted 12/10-13 with a similar presentation with acute encephalopathy.  This resolved completely and she had an extensive and unremarkable evaluation.  This was attributed to AKI with metabolic acidosis and she improved with supportive care.  She was seen on 1/14 by psychiatry for anxiety/bipolar and was continued on her home meds - Lexapro 30 mg daily; Valium 10 mg PO TID prn; and Seroquel 400 mg qhs.  ED Course:  This happens intermittently - ?BZD.  AKI, acidosis - ?alternative cause.  Family is concerned that she is not safe to go home.  Review of Systems:  Unable to perform  Ambulatory Status:  Ambulates with a walker  Past Medical History:  Diagnosis Date  . Acute CHF ([place]) 07/2016  . Depressive disorder, not elsewhere classified   . Family history of adverse reaction to anesthesia    mother passed away during surgery  . Hypertension   . Hypothyroid    Dr. Wilson Singer  . Kidney stone   . Lupus (systemic lupus erythematosus) ([place])   . Migraine   . Pure hypercholesterolemia   . Stroke [facility])     Past Surgical History:  Procedure Laterality Date  . CESAREAN SECTION    . ECTOPIC PREGNANCY SURGERY     times 2  . JOINT REPLACEMENT    . LAPAROSCOPY     with rt salpingectomy  . [place] SURGERY  2003  . TONSILLECTOMY    . TOTAL ABDOMINAL HYSTERECTOMY  2004   ovaries retained, DUB  . TUBAL LIGATION  1985    Social History   Socioeconomic History  . Marital status: Married    Spouse name: Ludwig Clarks  . Number of children: 1  . Years of education: 19  . Highest education level: Not on file  Occupational History  . Occupation: disability    Employer: UNEMPLOYED  Social Needs  . Financial resource strain: Not on file  . Food insecurity:    Worry: Not on file    Inability: Not on file  . Transportation needs:    Medical: Not on file    Non-medical: Not on file  Tobacco Use  . Smoking status: Current Every Day Smoker    Packs/day: 0.50    Years: 35.00    Pack years: 17.50  Types: Cigarettes  . Smokeless tobacco: Never Used  . Tobacco comment: Continued cessation encouraged  Substance and Sexual Activity  . Alcohol use: Yes    Comment: 1 drink every 3-4 months.  . Drug use: No  . Sexual activity: Yes    Partners: Male    Birth control/protection: Surgical    Comment: TAH  Lifestyle  . Physical activity:    Days per week: Not on file    Minutes per session: Not on file  . Stress: Not on file  Relationships  . Social connections:    Talks on phone: Not on file    Gets together: Not on file    Attends religious service: Not on file    Active member of club or organization: Not on file    Attends meetings of clubs or organizations: Not on file    Relationship status: Not on file  . Intimate partner violence:    Fear of current or ex partner: Not on  file    Emotionally abused: Not on file    Physically abused: Not on file    Forced sexual activity: Not on file  Other Topics Concern  . 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   Patient has one son, lives in [place].   Patient is disabled.   Patient has a college education.   Patient is right-handed.   Patient drinks some caffeine.    No Known Allergies  Family History  Problem Relation Age of Onset  . Diabetes Mother   . Hypertension Sister   . Diabetes Maternal Grandmother   . Autoimmune disease Neg Hx     Prior to Admission medications   Medication Sig Start Date End Date Taking? Authorizing Provider  amLODipine (NORVASC) 10 MG tablet Take 10 mg by mouth daily. 04/17/18   [provider]  aspirin EC 81 MG tablet Take 1 tablet (81 mg total) by mouth daily. 06/18/18 06/18/19  Ghimire, Henreitta Leber, MD  atorvastatin (LIPITOR) 20 MG tablet Take 20 mg by mouth daily.    [provider]  azaTHIOprine (IMURAN) 50 MG tablet Take 50 mg by mouth 2 (two) times daily.  02/20/17   [provider]  calcitRIOL (ROCALTROL) 0.25 MCG capsule Take 0.25 mcg by mouth daily. 04/21/18   [provider]  diazepam (VALIUM) 10 MG tablet Take 1 tablet (10 mg total) by mouth every 8 (eight) hours as needed for anxiety. 07/20/18   Addison Lank, PA-C  diphenhydrAMINE (BENADRYL) 25 MG tablet Take 25 mg by mouth at bedtime.    [provider]  escitalopram (LEXAPRO) 10 MG tablet Take 3 tablets (30 mg total) by mouth daily. 07/20/18   Donnal Moat T, PA-C  folic acid (FOLVITE) 1 MG tablet Take 1 mg by mouth daily.    [provider]  hydroxychloroquine (PLAQUENIL) 200 MG tablet Take 200 mg by mouth daily.  02/27/17   [provider]  levothyroxine (SYNTHROID, LEVOTHROID) 50 MCG tablet Take 50 mcg by mouth daily before breakfast.    [provider]  metoprolol succinate  (TOPROL-XL) 50 MG 24 hr tablet Take 50 mg by mouth daily. Take with or immediately following a meal.    [provider]  omeprazole (PRILOSEC) 20 MG capsule Take 20 mg by mouth daily.    [provider]  ondansetron (ZOFRAN) 4 MG tablet Take 1 tablet (4 mg total) by mouth every  6 (six) hours as needed for nausea. 02/13/18   Mariel Aloe, MD  polyethylene glycol (MIRALAX / GLYCOLAX) packet Take 17 g by mouth 2 (two) times daily. 02/13/18   Mariel Aloe, MD  predniSONE (DELTASONE) 10 MG tablet Take 10 mg by mouth daily with breakfast.    [provider]  psyllium (METAMUCIL SMOOTH TEXTURE) 58.6 % powder Take 1 packet by mouth 3 (three) times daily. 02/13/18   Mariel Aloe, MD  QUEtiapine (SEROQUEL) 400 MG tablet Take 1 tablet (400 mg total) by mouth at bedtime. 07/20/18   Addison Lank, PA-C  ranitidine (ZANTAC 75) 75 MG tablet Take 75 mg by mouth 2 (two) times daily as needed for indigestion.    [provider]  sodium bicarbonate 650 MG tablet Take 1,300 mg by mouth 2 (two) times daily.    [provider]  vitamin B-12 (CYANOCOBALAMIN) 1000 MCG tablet Take 1,000 mcg by mouth daily.    [provider]    Physical Exam: Vitals:   07/23/18 0937 07/23/18 1552  BP:  (!) 158/100  Pulse:  95  Resp:  20  Temp:  98 F (36.7 C)  TempSrc:  Oral  SpO2: 98% 98%     . General:  Somnolent, agitated, clearly confused.  Somewhat redirectable.  Answers direct questions but quite tangential . Eyes:  PERRL, EOMI, normal lids, iris . ENT:  grossly normal hearing, lips & tongue, mmm; suboptimal dentition . Neck:  no LAD, masses or thyromegaly . Cardiovascular:  RRR, no m/r/g. No LE edema.  [name] Respiratory:  CTA bilaterally with no wheezes/rales/rhonchi.  Normal respiratory effort.  Chronic intermittent coarse cough throughout exam with upper airway noise noted. . Abdomen:  soft, NT, ND, NABS . Skin:  no rash or induration seen on limited  exam . Musculoskeletal:  grossly normal tone BUE/BLE, good ROM, no bony abnormality . Lower extremity:  No LE edema.  Limited foot exam with no ulcerations.  2+ distal pulses. [name] Psychiatric:  Somnolent and agitated but redirectable.  Answers limited questions appropriately but clearly tangential Neurologic:  Unable to perform   Radiological Exams on Admission: Ct Head Wo Contrast  Result Date: 07/23/2018 CLINICAL DATA:  Altered mental status with decreased responsiveness EXAM: CT HEAD WITHOUT CONTRAST TECHNIQUE: Contiguous axial images were obtained from the base of the skull through the vertex without intravenous contrast. COMPARISON:  Head CT June 15, 2018 and brain MRI June 15, 2018 FINDINGS: Brain: The ventricles are normal in size and configuration. There is no intracranial mass, hemorrhage, extra-axial fluid collection, or midline shift. Brain parenchyma appears unremarkable. No acute infarct evident. There is mild basal ganglia calcification which is likely physiologic in this age group. Vascular: No hyperdense vessel. There are foci of calcification in each carotid siphon region. Skull: Bony calvarium appears intact. Sinuses/Orbits: There is mucosal thickening in several ethmoid air cells. Other visualized paranasal sinuses are clear. Visualized orbits appear symmetric bilaterally. Other: Mastoid air cells are clear. IMPRESSION: No evident acute infarct. No mass or hemorrhage. Foci of arterial vascular calcification noted. There is mucosal thickening in several ethmoid air cells. Electronically Signed   By: Lowella Grip III M.D.   On: 07/23/2018 10:53   Dg Chest Port 1 View  Result Date: 07/23/2018 CLINICAL DATA:  Altered level of consciousness. Dialysis. EXAM: PORTABLE CHEST 1 VIEW COMPARISON:  Chest radiograph and CT 06/15/2018 FINDINGS: The patient is rotated to the right. The cardiac silhouette remains enlarged. Diffuse interstitial densities are unchanged  from the prior  radiograph though increased from older radiographs of 02/02/2018. No sizable pleural effusion or pneumothorax is identified. IMPRESSION: Cardiomegaly with unchanged interstitial densities which could reflect mild edema or atypical infection superimposed on chronic lung disease. Electronically Signed   By: Logan Bores M.D.   On: 07/23/2018 10:17    EKG: Independently reviewed.  NSR with rate 91; bigeminy and IVCD are noted with no apparent acute ischemia   Labs on Admission: I have personally reviewed the available labs and imaging studies at the time of the admission.  Pertinent labs:   VBG: 7.218/35.8/14.6 UA: small Hgb, 30 protein UDS: positive BZD CO2 14 Glucose 109 BUN 34/Creatinine 2.57/GFR 23; baseline creatinine 1.6-1.8, GFR 30-35 WBC 4.9 Hgb 9.0; 10.1 on 12/10 MCV 123.5 Platelets 423 TSH 0.410 on 12/12  Assessment/Plan Principal Problem:   AMS (altered mental status) Active Problems:   SLE (systemic lupus erythematosus) (HCC)   Hypothyroid   Essential hypertension, benign   Acute renal failure superimposed on stage 3 chronic kidney disease (HCC)   RTA (renal tubular acidosis)   Chronic diastolic CHF (congestive heart failure) (HCC)   Bipolar disease, chronic ([place])   AMS -Patient presenting with encephalopathy as evidenced by her obtunded/unresponsiveness -Uncertain when was her last normal, since family doesn't provide a really effective history -She was admitted for this multiple times in the last year, including in 12/19 -Evaluation thus far unremarkable other than recurrent acidosis and renal failure (see below) -While initial concern was for infection in the setting of chronic immunosuppression, there is no other evidence of infection at this time; cultures are pending -Suspect medication misadventure, likely associated with Valium overusage - will hold; bicarbonate noncompliance is also a likely issue -Based on unremarkable evaluation with current ability to  protect her airway, will observe for now on telemetry with IVF hydration and telemetry monitoring -She will need a sitter based on her agitation -For now will place in observation, as the patient may improve quickly and be appropriate for d/c to home tomorrow -She may require placement if she does not show rapid improvement since the husband works starting about 2AM and the sister lives on the other side of town and there is no one available to help the patient manage her medications  Acute renal failure on stage 3 CKD -Once again she returns with renal failure superimposed on CKD -Will hydrate and monitor -Continue Calcitriol  Renal tubular acidosis -Patient with h/o the same and is on standing bicarbonate -Her husband reports that he saw a bottle that "looked pretty full" on the shelf and so he is not certain that she is taking this medication -Will give an amp of bicarbonate via IV and then transition back to PO HCO3 in AM -It appears that the spironolactone may have been resumed; this will be held and should be considered for discontinuation  HTN -Continue Norvasc, Toprol XL  Hypothyroidism -Normal TSH on 12/12 -Continue Synthroid at current dose for now  Lupus -Imuran, Plaquenil, Prednisone -Continue folate -Again there is no indication to suggest lupus flare as the cause of the AMS and she had a negative MRI during her last hospitalization with similar presentation  Chronic diastolic CHF -Appears to be compensated at this time -Normal echo on 07/13/17 -Prior echo in 1/18 showed grade 2 diastolic dysfunction -There does not appear to be an indication to repeat the echo at this time  Bipolar d/o -Continue Lexapro and Seroquel -Hold Valium, as noted above -Consider taper off this medication  to eliminate possible etiologies for recurrent admissions -Prednisone can contribute to psychosis, but this would be an unusual presentation  Chronic cough -Family reports that she has  had this cough for approximately 1 year -She is on immunosuppressant medication and so is at increased risk for various infections -Today's CXR was stable -CT on 06/15/18 showed extensive changes of COPD -Suggest CT with contrast once her renal function has improved  DVT prophylaxis: Lovenox  Code Status:  Full - confirmed with family Family Communication: Sister and husband were present throughout evaluation  Disposition Plan:  To be determined Consults called: SW Admission status: It is my clinical opinion that referral for OBSERVATION is reasonable and necessary in this patient based on the above information provided. The aforementioned taken together are felt to place the patient at high risk for further clinical deterioration. However it is anticipated that the patient may be medically stable for discharge from the hospital within 24 to 48 hours.    Karmen Bongo MD [facility]  If note is complete, please contact covering daytime or nighttime physician. [email or web address]   07/23/2018, 6:39 PM

## 2018-07-23 NOTE — ED Provider Notes (Signed)
[facility] EMERGENCY DEPARTMENT Provider Note   CSN: [id number] Arrival date & time: 07/23/18  0935     History   Chief Complaint Chief Complaint  Patient presents with  . Altered Mental Status    HPI Anna [name] is a 58 y.o. female.  58 yo F with a chief complaint of altered mental status.  The patient apparently drove to her nephrologist office this morning and was noted to be acting differently than her normal and so 911 was called and she was sent here to the ED.  According to paramedics the patient has been having a cough.  They had difficulty obtaining further history.  Patient denies any new medications.  Denies any coingestants.  She would currently just like to go home.  The history is provided by the patient and the EMS personnel.  Altered Mental Status  Presenting symptoms: behavior changes and disorientation   Severity:  Moderate Most recent episode:  Today Episode history:  Continuous Duration:  2 hours Timing:  Constant Progression:  Unchanged Chronicity:  Recurrent Context: taking medications as prescribed and not recent change in medication   Associated symptoms: slurred speech   Associated symptoms: no fever, no headaches, no nausea, no palpitations and no vomiting     Past Medical History:  Diagnosis Date  . Acute CHF ([place]) 07/2016  . Depressive disorder, not elsewhere classified   . Family history of adverse reaction to anesthesia    mother passed away during surgery  . Hypertension   . Hypothyroid    Dr. Wilson Singer  . Kidney stone   . Lupus (systemic lupus erythematosus) ([place])   . Migraine   . Pure hypercholesterolemia   . Stroke [facility])     Patient Active Problem List   Diagnosis Date Noted  . Encephalopathy acute 06/17/2018  . Elevated troponin 06/16/2018  . CKD (chronic kidney disease), stage III ([place]) 06/16/2018  . 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 HTN (hypertension) 06/15/2018  . Weakness on right side of  face 06/15/2018  . Constipation in female 02/12/2018  . Orthostatic hypotension 02/12/2018  . History of TIA (transient ischemic attack) 02/12/2018  . Intractable nausea and vomiting 02/12/2018  . Pulmonary hypertension ([place]) 08/25/2016  . Pleural effusion   . Pulmonary edema   . SIRS (systemic inflammatory response syndrome) ([facility])   . Accidental drug overdose   . Acute diastolic CHF (congestive heart failure) ([place])   . HCAP (healthcare-associated pneumonia)   . Long-term use of Plaquenil   . History of lupus ([place])   . AKI (acute kidney injury) ([place]) 08/04/2016  . Congestive heart failure (CHF) ([place]) 08/04/2016  . Anemia 08/04/2016  . Acute respiratory failure with hypoxia ([place]) 08/04/2016  . Acute encephalopathy 08/04/2016  . Migraine without aura 12/09/2012  . Unspecified constipation 12/08/2012  . Unspecified hypothyroidism 12/08/2012  . GERD (gastroesophageal reflux disease) 12/08/2012  . Multinodular goiter 08/20/2011  . Essential hypertension, benign 12/25/2010  . SLE (systemic lupus erythematosus) ([place]) 11/27/2010  . Depression 11/27/2010  . Hyperlipidemia 11/27/2010  . Hypothyroid 11/27/2010  . Encounter for long-term (current) use of other medications 11/27/2010    Past Surgical History:  Procedure Laterality Date  . CESAREAN SECTION    . ECTOPIC PREGNANCY SURGERY     times 2  . JOINT REPLACEMENT    . LAPAROSCOPY     with rt salpingectomy  . [place] SURGERY  2003  . TONSILLECTOMY    . TOTAL ABDOMINAL HYSTERECTOMY  2004  ovaries retained, DUB  . TUBAL LIGATION  1985     OB History    Gravida  5   Para  1   Term  1   Preterm      AB  4   Living  1     SAB      TAB  2   Ectopic  2   Multiple      Live Births  1            Home Medications    Prior to Admission medications   Medication Sig Start Date End Date Taking? Authorizing Provider  amLODipine (NORVASC) 10 MG tablet Take 10 mg by mouth daily. 04/17/18   [provider]  aspirin EC 81 MG tablet Take 1 tablet (81 mg total) by mouth daily. 06/18/18 06/18/19  Ghimire, Henreitta Leber, MD  atorvastatin (LIPITOR) 20 MG tablet Take 20 mg by mouth daily.    [provider]  azaTHIOprine (IMURAN) 50 MG tablet Take 50 mg by mouth 2 (two) times daily.  02/20/17   [provider]  calcitRIOL (ROCALTROL) 0.25 MCG capsule Take 0.25 mcg by mouth daily. 04/21/18   [provider]  diazepam (VALIUM) 10 MG tablet Take 1 tablet (10 mg total) by mouth every 8 (eight) hours as needed for anxiety. 07/20/18   Addison Lank, PA-C  diphenhydrAMINE (BENADRYL) 25 MG tablet Take 25 mg by mouth at bedtime.    [provider]  escitalopram (LEXAPRO) 10 MG tablet Take 3 tablets (30 mg total) by mouth daily. 07/20/18   Donnal Moat T, PA-C  folic acid (FOLVITE) 1 MG tablet Take 1 mg by mouth daily.    [provider]  hydroxychloroquine (PLAQUENIL) 200 MG tablet Take 200 mg by mouth daily.  02/27/17   [provider]  levothyroxine (SYNTHROID, LEVOTHROID) 50 MCG tablet Take 50 mcg by mouth daily before breakfast.    [provider]  metoprolol succinate (TOPROL-XL) 50 MG 24 hr tablet Take 50 mg by mouth daily. Take with or immediately following a meal.    [provider]  omeprazole (PRILOSEC) 20 MG capsule Take 20 mg by mouth daily.    [provider]  ondansetron (ZOFRAN) 4 MG tablet Take 1 tablet (4 mg total) by mouth every 6 (six) hours as needed for nausea. 02/13/18   Mariel Aloe, MD  polyethylene glycol (MIRALAX / GLYCOLAX) packet Take 17 g by mouth 2 (two) times daily. 02/13/18   Mariel Aloe, MD  predniSONE (DELTASONE) 10 MG tablet Take 10 mg by mouth daily with breakfast.    [provider]  psyllium (METAMUCIL SMOOTH TEXTURE) 58.6 % powder Take 1 packet by mouth 3 (three) times daily. 02/13/18   Mariel Aloe, MD  QUEtiapine (SEROQUEL) 400 MG tablet Take 1 tablet (400 mg total) by  mouth at bedtime. 07/20/18   Addison Lank, PA-C  ranitidine (ZANTAC 75) 75 MG tablet Take 75 mg by mouth 2 (two) times daily as needed for indigestion.    [provider]  sodium bicarbonate 650 MG tablet Take 1,300 mg by mouth 2 (two) times daily.    [provider]  vitamin B-12 (CYANOCOBALAMIN) 1000 MCG tablet Take 1,000 mcg by mouth daily.    [provider]    Family History Family History  Problem Relation Age of Onset  . Diabetes Mother   . Hypertension Sister   . Diabetes Maternal Grandmother   . Autoimmune  disease Neg Hx     Social History Social History   Tobacco Use  . Smoking status: Former Smoker    Packs/day: 0.25    Years: 35.00    Pack years: 8.75    Types: Cigarettes  . Smokeless tobacco: Never Used  . Tobacco comment: Continued cessation encouraged  Substance Use Topics  . Alcohol use: Yes    Comment: 1 drink every 3-4 months.  . Drug use: No     Allergies   Patient has no known allergies.   Review of Systems Review of Systems  Constitutional: Positive for activity change. Negative for chills and fever.  HENT: Negative for congestion and rhinorrhea.   Eyes: Negative for redness and visual disturbance.  Respiratory: Negative for shortness of breath and wheezing.   Cardiovascular: Negative for chest pain and palpitations.  Gastrointestinal: Negative for nausea and vomiting.  Genitourinary: Negative for dysuria and urgency.  Musculoskeletal: Negative for arthralgias and myalgias.  Skin: Negative for pallor and wound.  Neurological: Negative for dizziness and headaches.     Physical Exam Updated Vital Signs LMP 07/07/2002   SpO2 98% Comment: 90 RA   Physical Exam Vitals signs and nursing note reviewed.  Constitutional:      General: She is not in acute distress.    Appearance: She is well-developed. She is not diaphoretic.     Comments: Clinically appears to be intoxicated  HENT:     Head: Normocephalic and  atraumatic.  Eyes:     Pupils: Pupils are equal, round, and reactive to light.  Neck:     Musculoskeletal: Normal range of motion and neck supple.  Cardiovascular:     Rate and Rhythm: Normal rate and regular rhythm.     Heart sounds: No murmur. No friction rub. No gallop.   Pulmonary:     Effort: Pulmonary effort is normal.     Breath sounds: No wheezing or rales.  Abdominal:     General: There is no distension.     Palpations: Abdomen is soft.     Tenderness: There is no abdominal tenderness.  Musculoskeletal:        General: No tenderness.  Skin:    General: Skin is warm and dry.  Neurological:     Mental Status: She is alert and oriented to person, place, and time.  Psychiatric:        Behavior: Behavior normal.      ED Treatments / Results  Labs (all labs ordered are listed, but only abnormal results are displayed) Labs Reviewed  COMPREHENSIVE METABOLIC PANEL - Abnormal; Notable for the following components:      Result Value   Chloride 118 (*)    CO2 14 (*)    Glucose, Bld 109 (*)    BUN 34 (*)    Creatinine, Ser 2.57 (*)    GFR calc non Af Amer 20 (*)    GFR calc Af Amer 23 (*)    All other components within normal limits  CBC WITH DIFFERENTIAL/PLATELET - Abnormal; Notable for the following components:   RBC 2.13 (*)    Hemoglobin 9.0 (*)    HCT 26.3 (*)    MCV 123.5 (*)    MCH 42.3 (*)    RDW 26.4 (*)    Platelets 423 (*)    Lymphs Abs 0.6 (*)    All other components within normal limits  CBG MONITORING, ED - Abnormal; Notable for the following components:   Glucose-Capillary 114 (*)  All other components within normal limits  I-STAT CHEM 8, ED - Abnormal; Notable for the following components:   Chloride 124 (*)    BUN 33 (*)    Creatinine, Ser 2.80 (*)    Glucose, Bld 103 (*)    TCO2 16 (*)    Hemoglobin 8.8 (*)    HCT 26.0 (*)    All other components within normal limits  I-STAT VENOUS BLOOD GAS, ED - Abnormal; Notable for the following  components:   pH, Ven 7.218 (*)    pCO2, Ven 35.8 (*)    pO2, Ven 46.0 (*)    Bicarbonate 14.6 (*)    TCO2 16 (*)    Acid-base deficit 12.0 (*)    All other components within normal limits  URINE CULTURE  AMMONIA  ETHANOL  URINALYSIS, ROUTINE W REFLEX MICROSCOPIC  RAPID URINE DRUG SCREEN, HOSP PERFORMED  ACETAMINOPHEN LEVEL  SALICYLATE LEVEL  I-STAT CG4 LACTIC ACID, ED    EKG EKG Interpretation  Date/Time:  Friday July 23 2018 09:45:29 EST Ventricular Rate:  91 PR Interval:    QRS Duration: 127 QT Interval:  451 QTC Calculation: 565 R Axis:   0 Text Interpretation:  Sinus rhythm Supraventricular bigeminy Nonspecific intraventricular conduction delay Baseline wander TECHNICALLY DIFFICULT Otherwise no significant change Confirmed by Deno Etienne [phone]) on 07/23/2018 9:53:15 AM   Radiology Ct Head Wo Contrast  Result Date: 07/23/2018 CLINICAL DATA:  Altered mental status with decreased responsiveness EXAM: CT HEAD WITHOUT CONTRAST TECHNIQUE: Contiguous axial images were obtained from the base of the skull through the vertex without intravenous contrast. COMPARISON:  Head CT June 15, 2018 and brain MRI June 15, 2018 FINDINGS: Brain: The ventricles are normal in size and configuration. There is no intracranial mass, hemorrhage, extra-axial fluid collection, or midline shift. Brain parenchyma appears unremarkable. No acute infarct evident. There is mild basal ganglia calcification which is likely physiologic in this age group. Vascular: No hyperdense vessel. There are foci of calcification in each carotid siphon region. Skull: Bony calvarium appears intact. Sinuses/Orbits: There is mucosal thickening in several ethmoid air cells. Other visualized paranasal sinuses are clear. Visualized orbits appear symmetric bilaterally. Other: Mastoid air cells are clear. IMPRESSION: No evident acute infarct. No mass or hemorrhage. Foci of arterial vascular calcification noted. There is mucosal  thickening in several ethmoid air cells. Electronically Signed   By: Lowella Grip III M.D.   On: 07/23/2018 10:53   Dg Chest Port 1 View  Result Date: 07/23/2018 CLINICAL DATA:  Altered level of consciousness. Dialysis. EXAM: PORTABLE CHEST 1 VIEW COMPARISON:  Chest radiograph and CT 06/15/2018 FINDINGS: The patient is rotated to the right. The cardiac silhouette remains enlarged. Diffuse interstitial densities are unchanged from the prior radiograph though increased from older radiographs of 02/02/2018. No sizable pleural effusion or pneumothorax is identified. IMPRESSION: Cardiomegaly with unchanged interstitial densities which could reflect mild edema or atypical infection superimposed on chronic lung disease. Electronically Signed   By: Logan Bores M.D.   On: 07/23/2018 10:17    Procedures Procedures (including critical care time)  Medications Ordered in ED Medications  sodium chloride 0.9 % bolus 1,000 mL (has no administration in time range)     Initial Impression / Assessment and Plan / ED Course  I have reviewed the triage vital signs and the nursing notes.  Pertinent labs & imaging results that were available during my care of the patient were reviewed by me and considered in my medical decision making (see chart  for details).     58 yo F with a chief complaint of confusion.  Clinically the patient appears to be intoxicated.  She is chronically on Valium I wonder if she took extra this morning.  Patient denies that.  Will obtain a laboratory evaluation and observe in the emergency department.  Patient continues to be significantly altered.  Family is convinced that this is secondary to her benzodiazepine use.  Interestingly the patient is always an acute kidney injury with an acidosis with this.  I wondered if this was a D lactic acidosis, though patient denied a significant carbohydrate load this morning.  She also does not have short bowel syndrome which makes this  unlikely.  Continues to be altered.  Discussed with hospitalist for admission.  The patients results and plan were reviewed and discussed.   Any x-rays performed were independently reviewed by myself.   Differential diagnosis were considered with the presenting HPI.  Medications  aspirin EC tablet 81 mg (81 mg Oral Given 07/25/18 1016)  hydroxychloroquine (PLAQUENIL) tablet 200 mg (200 mg Oral Given 07/25/18 1016)  amLODipine (NORVASC) tablet 10 mg (10 mg Oral Given 07/25/18 1018)  atorvastatin (LIPITOR) tablet 20 mg (20 mg Oral Given 07/25/18 1016)  metoprolol succinate (TOPROL-XL) 24 hr tablet 50 mg (50 mg Oral Given 07/25/18 1016)  escitalopram (LEXAPRO) tablet 30 mg (30 mg Oral Given 07/25/18 1016)  QUEtiapine (SEROQUEL) tablet 400 mg (400 mg Oral Given 07/25/18 2321)  calcitRIOL (ROCALTROL) capsule 0.25 mcg (0.25 mcg Oral Given 07/25/18 1018)  levothyroxine (SYNTHROID, LEVOTHROID) tablet 50 mcg (50 mcg Oral Given 07/26/18 0609)  predniSONE (DELTASONE) tablet 10 mg (10 mg Oral Given 07/25/18 0748)  pantoprazole (PROTONIX) EC tablet 40 mg (40 mg Oral Given 07/25/18 1016)  polyethylene glycol (MIRALAX / GLYCOLAX) packet 17 g (17 g Oral Not Given 8/92/11 9417)  folic acid (FOLVITE) tablet 1 mg (1 mg Oral Given 07/25/18 1017)  vitamin B-12 (CYANOCOBALAMIN) tablet 1,000 mcg (1,000 mcg Oral Given 07/25/18 1017)  azaTHIOprine (IMURAN) tablet 50 mg (50 mg Oral Given 07/25/18 2313)  enoxaparin (LOVENOX) injection 40 mg (40 mg Subcutaneous Given 07/25/18 1610)  acetaminophen (TYLENOL) tablet 650 mg (650 mg Oral Given 07/25/18 2312)    Or  acetaminophen (TYLENOL) suppository 650 mg ( Rectal See Alternative 07/25/18 2312)  docusate sodium (COLACE) capsule 100 mg (100 mg Oral Given 07/25/18 2313)  ondansetron (ZOFRAN) tablet 4 mg ( Oral See Alternative 07/25/18 1746)    Or  ondansetron (ZOFRAN) injection 4 mg (4 mg Intravenous Given 07/25/18 1746)  lactated ringers infusion ( Intravenous Rate/Dose Verify 07/26/18  0600)  sodium bicarbonate tablet 1,300 mg (1,300 mg Oral Given 07/25/18 2320)  benzonatate (TESSALON) capsule 200 mg (200 mg Oral Given 07/25/18 2335)  guaiFENesin (MUCINEX) 12 hr tablet 600 mg (600 mg Oral Given 07/25/18 2316)  sodium chloride 0.9 % bolus 1,000 mL (0 mLs Intravenous Stopped 07/23/18 1605)  sodium bicarbonate injection 100 mEq (100 mEq Intravenous Given 07/23/18 1747)  hydrOXYzine (ATARAX/VISTARIL) tablet 50 mg (50 mg Oral Given 07/25/18 0132)    Vitals:   07/25/18 1552 07/25/18 1942 07/26/18 0023 07/26/18 0403  BP: (!) 162/97 123/87 113/71 (!) 140/93  Pulse: 88 90 90 81  Resp: 16 16 16    Temp: 98.4 F (36.9 C) 98.9 F (37.2 C) 98.2 F (36.8 C) (!) 97.3 F (36.3 C)  TempSrc: Oral Oral Oral Oral  SpO2: 90% 91% 92% 96%  Weight:      Height:  Final diagnoses:  Transient alteration of awareness    Admission/ observation were discussed with the admitting physician, patient and/or family and they are comfortable with the plan.    Final Clinical Impressions(s) / ED Diagnoses   Final diagnoses:  Transient alteration of awareness    ED Discharge Orders    None       Deno Etienne, DO 07/26/18 [id number]

## 2018-07-23 NOTE — ED Triage Notes (Signed)
Per EMS pt coming from her kidney MD. [facility] called for [facility]. Pt reports a productive cough for "a while"  [facility] reports she immediately went to sleep when they picked her up. Alert to self only.

## 2018-07-24 DIAGNOSIS — E869 Volume depletion, unspecified: Secondary | ICD-10-CM | POA: Diagnosis not present

## 2018-07-24 DIAGNOSIS — D509 Iron deficiency anemia, unspecified: Secondary | ICD-10-CM | POA: Diagnosis present

## 2018-07-24 DIAGNOSIS — E872 Acidosis: Secondary | ICD-10-CM | POA: Diagnosis present

## 2018-07-24 DIAGNOSIS — E039 Hypothyroidism, unspecified: Secondary | ICD-10-CM | POA: Diagnosis present

## 2018-07-24 DIAGNOSIS — M329 Systemic lupus erythematosus, unspecified: Secondary | ICD-10-CM | POA: Diagnosis present

## 2018-07-24 DIAGNOSIS — N179 Acute kidney failure, unspecified: Secondary | ICD-10-CM | POA: Diagnosis present

## 2018-07-24 DIAGNOSIS — G4733 Obstructive sleep apnea (adult) (pediatric): Secondary | ICD-10-CM | POA: Diagnosis present

## 2018-07-24 DIAGNOSIS — F419 Anxiety disorder, unspecified: Secondary | ICD-10-CM | POA: Diagnosis present

## 2018-07-24 DIAGNOSIS — R401 Stupor: Secondary | ICD-10-CM | POA: Diagnosis not present

## 2018-07-24 DIAGNOSIS — N2589 Other disorders resulting from impaired renal tubular function: Secondary | ICD-10-CM | POA: Diagnosis present

## 2018-07-24 DIAGNOSIS — J189 Pneumonia, unspecified organism: Secondary | ICD-10-CM | POA: Diagnosis present

## 2018-07-24 DIAGNOSIS — F319 Bipolar disorder, unspecified: Secondary | ICD-10-CM | POA: Diagnosis present

## 2018-07-24 DIAGNOSIS — I130 Hypertensive heart and chronic kidney disease with heart failure and stage 1 through stage 4 chronic kidney disease, or unspecified chronic kidney disease: Secondary | ICD-10-CM | POA: Diagnosis present

## 2018-07-24 DIAGNOSIS — G9341 Metabolic encephalopathy: Secondary | ICD-10-CM | POA: Diagnosis present

## 2018-07-24 DIAGNOSIS — E785 Hyperlipidemia, unspecified: Secondary | ICD-10-CM | POA: Diagnosis present

## 2018-07-24 DIAGNOSIS — Z9114 Patient's other noncompliance with medication regimen: Secondary | ICD-10-CM | POA: Diagnosis not present

## 2018-07-24 DIAGNOSIS — N183 Chronic kidney disease, stage 3 (moderate): Secondary | ICD-10-CM | POA: Diagnosis present

## 2018-07-24 DIAGNOSIS — I5032 Chronic diastolic (congestive) heart failure: Secondary | ICD-10-CM | POA: Diagnosis present

## 2018-07-24 DIAGNOSIS — J440 Chronic obstructive pulmonary disease with acute lower respiratory infection: Secondary | ICD-10-CM | POA: Diagnosis present

## 2018-07-24 DIAGNOSIS — J810 Acute pulmonary edema: Secondary | ICD-10-CM | POA: Diagnosis not present

## 2018-07-24 DIAGNOSIS — Z90710 Acquired absence of both cervix and uterus: Secondary | ICD-10-CM | POA: Diagnosis not present

## 2018-07-24 DIAGNOSIS — D631 Anemia in chronic kidney disease: Secondary | ICD-10-CM | POA: Diagnosis present

## 2018-07-24 DIAGNOSIS — R4182 Altered mental status, unspecified: Secondary | ICD-10-CM | POA: Diagnosis present

## 2018-07-24 DIAGNOSIS — D539 Nutritional anemia, unspecified: Secondary | ICD-10-CM | POA: Diagnosis present

## 2018-07-24 DIAGNOSIS — I10 Essential (primary) hypertension: Secondary | ICD-10-CM | POA: Diagnosis not present

## 2018-07-24 DIAGNOSIS — J441 Chronic obstructive pulmonary disease with (acute) exacerbation: Secondary | ICD-10-CM | POA: Diagnosis present

## 2018-07-24 DIAGNOSIS — J9601 Acute respiratory failure with hypoxia: Secondary | ICD-10-CM | POA: Diagnosis present

## 2018-07-24 DIAGNOSIS — N170 Acute kidney failure with tubular necrosis: Secondary | ICD-10-CM | POA: Diagnosis not present

## 2018-07-24 DIAGNOSIS — F17210 Nicotine dependence, cigarettes, uncomplicated: Secondary | ICD-10-CM | POA: Diagnosis present

## 2018-07-24 DIAGNOSIS — I340 Nonrheumatic mitral (valve) insufficiency: Secondary | ICD-10-CM | POA: Diagnosis not present

## 2018-07-24 LAB — BASIC METABOLIC PANEL
Anion gap: 10 (ref 5–15)
BUN: 26 mg/dL — ABNORMAL HIGH (ref 6–20)
CO2: 17 mmol/L — ABNORMAL LOW (ref 22–32)
Calcium: 9 mg/dL (ref 8.9–10.3)
Chloride: 115 mmol/L — ABNORMAL HIGH (ref 98–111)
Creatinine, Ser: 2.14 mg/dL — ABNORMAL HIGH (ref 0.44–1.00)
GFR calc Af Amer: 28 mL/min — ABNORMAL LOW (ref >60)
GFR calc non Af Amer: 25 mL/min — ABNORMAL LOW (ref >60)
Glucose, Bld: 113 mg/dL — ABNORMAL HIGH (ref 70–99)
POTASSIUM: 4.3 mmol/L (ref 3.5–5.1)
Sodium: 142 mmol/L (ref 135–145)

## 2018-07-24 LAB — CBC
HCT: 22.2 % — ABNORMAL LOW (ref 36.0–46.0)
Hemoglobin: 8.5 g/dL — ABNORMAL LOW (ref 12.0–15.0)
MCH: 45.5 pg — ABNORMAL HIGH (ref 26.0–34.0)
MCHC: 38.3 g/dL — ABNORMAL HIGH (ref 30.0–36.0)
MCV: 118.7 fL — ABNORMAL HIGH (ref 80.0–100.0)
Platelets: 378 10*3/uL (ref 150–400)
RBC: 1.87 MIL/uL — AB (ref 3.87–5.11)
WBC: 4.5 10*3/uL (ref 4.0–10.5)
nRBC: 0 % (ref 0.0–0.2)

## 2018-07-24 LAB — URINE CULTURE

## 2018-07-24 NOTE — Progress Notes (Signed)
Pt has agreed to let phlebotomy come back and draw her blood.

## 2018-07-24 NOTE — Progress Notes (Signed)
Pt refused her CBC and BMP lab draw this morning per phlebotomy.  Order was canceled.

## 2018-07-24 NOTE — Progress Notes (Signed)
Pt still with slurred speech.  Very tired and sleepy.

## 2018-07-24 NOTE — Progress Notes (Addendum)
Progress Note    Anna [name]  ZOX:[id number] DOB: [birth year]  DOA: 07/23/2018 PCP: [name], MD    Brief Narrative:   Chief complaint: Altered mental status  Medical records reviewed and are as summarized below:  Anna [name] is an 58 y.o. female with past medical history significant for CVA, SLE, hypothyroidism, HTN, HDL, and CHF; who presented acutely altered with acute renal failure.  Similar hospitalization in 06/2018 which improved with supportive care.  Assessment/Plan:   Principal Problem:   AMS (altered mental status) Active Problems:   SLE (systemic lupus erythematosus) (HCC)   Hypothyroid   Essential hypertension, benign   Acute renal failure superimposed on stage 3 chronic kidney disease (HCC)   RTA (renal tubular acidosis)   Chronic diastolic CHF (congestive heart failure) (HCC)   Bipolar disease, chronic (HCC)  Acute metabolic encephalopathy: From history was unclear when patient's last known normal.  Evaluation revealed recurrent acidosis, renal failure, and urine drug screen positive for benzodiazepines.  Chest x-ray otherwise similar to previous.  Patient currently still altered. - Continue sitter at bedside for patient safety - Question need of placement  Acute renal failure superimposed on chronic kidney disease stage III: Patient's baseline creatinine noted to be around 1.5 based off records.  Her initial creatinine noted to be elevated up to 2.57 on BMP.  Labs today showing downward trending to 2.14.  Patient lost IV access during the day. - IV team to replace -Continue Calcitrol - Continue lactated Ringer's at 100 mL/h - Recheck BMP in a.m.  Renal tubular acidosis : Patient was supposed to be on sodium bicarbonate tablets, but husband reported that the bottle looked pretty full.  Spironolactone was also held and should be considered for discontinuation.  Carbon dioxide improved from 14 to 17 today. - Continue sodium bicarbonate  tablet  Essential hypertension - Norvasc and metoprolol   Hypothyroidism: TSH within normal limits on 06/17/2018. - Continued  Synthroid  Chronic diastolic CHF: Patient appears currently compensated and echo within normal limits on 07/13/2017. - Continue to monitor  SLE on chronic immunosuppressive therapy - Continue Plaquenil, azathioprine, and prednisone  Chronic cough: Chest x-ray showed similar appearance to previous during last hospitalization. - May warrant rechecking CT of the chest once kidney function improves  Body mass index is 27.62 kg/m.   Family Communication/Anticipated D/C date and plan/Code Status   DVT prophylaxis: Lovenox ordered. Code Status: Full Code.  Family Communication: No family present at bedside Disposition Plan: To be determined   Medical Consultants:    None.   Anti-Infectives:    None  Subjective:   Patient reports that she was coughing and up all night breathing better  Objective:    Vitals:   07/23/18 1552 07/23/18 1950 07/24/18 0003 07/24/18 0611  BP: (!) 158/100 129/81 130/72 (!) 149/88  Pulse: 95 100 (!) 115 (!) 105  Resp: 20 17 18 18   Temp: 98 F (36.7 C) 98.5 F (36.9 C) 100 F (37.8 C) 99.2 F (37.3 C)  TempSrc: Oral Oral Axillary Oral  SpO2: 98% 96% 96% 100%  Weight:    80 kg  Height:    5\' 7"  (1.702 m)    Intake/Output Summary (Last 24 hours) at 07/24/2018 0802 Last data filed at 07/24/2018 0736 Gross per 24 hour  Intake 1849.99 ml  Output -  Net 1849.99 ml   Filed Weights   07/24/18 0611  Weight: 80 kg    Exam: Constitutional: Lethargic but easily arousable and  able to follow commands at this time. Eyes: PERRL, lids and conjunctivae normal ENMT: Mucous membranes are moist. Posterior pharynx clear of any exudate or lesions.   Neck: normal, supple, no masses, no thyromegaly Respiratory: Decreased aeration, but clear to auscultation bilaterally, no wheezing, no crackles. Normal respiratory effort. No  accessory muscle use.  Cardiovascular: Regular rate and rhythm, no murmurs / rubs / gallops. No extremity edema. 2+ pedal pulses. No carotid bruits.  Abdomen: no tenderness, no masses palpated. No hepatosplenomegaly. Bowel sounds positive.  Musculoskeletal: no clubbing / cyanosis. No joint deformity upper and lower extremities. Good ROM, no contractures. Normal muscle tone.  Skin: no rashes, lesions, ulcers. No induration Neurologic: CN 2-12 grossly intact. Sensation intact, DTR normal. Strength 5/5 in all 4.  Psychiatric:Alert and oriented x person and place.  Currently has a normal mood.    Data Reviewed:   I have personally reviewed following labs and imaging studies:  Labs: Labs show the following:   Basic Metabolic Panel: Recent Labs  Lab 07/23/18 0954 07/23/18 1032  NA 144 144  K 5.1 5.1  CL 118* 124*  CO2 14*  --   GLUCOSE 109* 103*  BUN 34* 33*  CREATININE 2.57* 2.80*  CALCIUM 9.0  --    GFR Estimated Creatinine Clearance: 23.6 mL/min (A) (by C-G formula based on SCr of 2.8 mg/dL (H)). Liver Function Tests: Recent Labs  Lab 07/23/18 0954  AST 30  ALT 17  ALKPHOS 63  BILITOT 0.3  PROT 7.4  ALBUMIN 3.9   No results for input(s): LIPASE, AMYLASE in the last 168 hours. Recent Labs  Lab 07/23/18 1545  AMMONIA 20   Coagulation profile No results for input(s): INR, PROTIME in the last 168 hours.  CBC: Recent Labs  Lab 07/23/18 0954 07/23/18 1032  WBC 4.9  --   NEUTROABS 3.7  --   HGB 9.0* 8.8*  HCT 26.3* 26.0*  MCV 123.5*  --   PLT 423*  --    Cardiac Enzymes: No results for input(s): CKTOTAL, CKMB, CKMBINDEX, TROPONINI in the last 168 hours. BNP (last 3 results) No results for input(s): PROBNP in the last 8760 hours. CBG: Recent Labs  Lab 07/23/18 0945  GLUCAP 114*   D-Dimer: No results for input(s): DDIMER in the last 72 hours. Hgb A1c: No results for input(s): HGBA1C in the last 72 hours. Lipid Profile: No results for input(s): CHOL,  HDL, LDLCALC, TRIG, CHOLHDL, LDLDIRECT in the last 72 hours. Thyroid function studies: No results for input(s): TSH, T4TOTAL, T3FREE, THYROIDAB in the last 72 hours.  Invalid input(s): FREET3 Anemia work up: Recent Labs    07/23/18 1545  VITAMINB12 359  FERRITIN 130  TIBC 284  IRON 39  RETICCTPCT 5.9*   Sepsis Labs: Recent Labs  Lab 07/23/18 0954 07/23/18 1033  WBC 4.9  --   LATICACIDVEN  --  0.76    Microbiology No results found for this or any previous visit (from the past 240 hour(s)).  Procedures and diagnostic studies:  Ct Head Wo Contrast  Result Date: 07/23/2018 CLINICAL DATA:  Altered mental status with decreased responsiveness EXAM: CT HEAD WITHOUT CONTRAST TECHNIQUE: Contiguous axial images were obtained from the base of the skull through the vertex without intravenous contrast. COMPARISON:  Head CT June 15, 2018 and brain MRI June 15, 2018 FINDINGS: Brain: The ventricles are normal in size and configuration. There is no intracranial mass, hemorrhage, extra-axial fluid collection, or midline shift. Brain parenchyma appears unremarkable. No acute infarct evident.  There is mild basal ganglia calcification which is likely physiologic in this age group. Vascular: No hyperdense vessel. There are foci of calcification in each carotid siphon region. Skull: Bony calvarium appears intact. Sinuses/Orbits: There is mucosal thickening in several ethmoid air cells. Other visualized paranasal sinuses are clear. Visualized orbits appear symmetric bilaterally. Other: Mastoid air cells are clear. IMPRESSION: No evident acute infarct. No mass or hemorrhage. Foci of arterial vascular calcification noted. There is mucosal thickening in several ethmoid air cells. Electronically Signed   By: Lowella Grip III M.D.   On: 07/23/2018 10:53   Dg Chest Port 1 View  Result Date: 07/23/2018 CLINICAL DATA:  Altered level of consciousness. Dialysis. EXAM: PORTABLE CHEST 1 VIEW COMPARISON:   Chest radiograph and CT 06/15/2018 FINDINGS: The patient is rotated to the right. The cardiac silhouette remains enlarged. Diffuse interstitial densities are unchanged from the prior radiograph though increased from older radiographs of 02/02/2018. No sizable pleural effusion or pneumothorax is identified. IMPRESSION: Cardiomegaly with unchanged interstitial densities which could reflect mild edema or atypical infection superimposed on chronic lung disease. Electronically Signed   By: Logan Bores M.D.   On: 07/23/2018 10:17    Medications:   . amLODipine  10 mg Oral Daily  . aspirin EC  81 mg Oral Daily  . atorvastatin  20 mg Oral Daily  . azaTHIOprine  50 mg Oral BID  . calcitRIOL  0.25 mcg Oral Daily  . docusate sodium  100 mg Oral BID  . enoxaparin (LOVENOX) injection  40 mg Subcutaneous Q24H  . escitalopram  30 mg Oral Daily  . folic acid  1 mg Oral Daily  . hydroxychloroquine  200 mg Oral Daily  . levothyroxine  50 mcg Oral Q0600  . metoprolol succinate  50 mg Oral Daily  . pantoprazole  40 mg Oral Daily  . polyethylene glycol  17 g Oral BID  . predniSONE  10 mg Oral Q breakfast  . QUEtiapine  400 mg Oral QHS  . sodium bicarbonate  1,300 mg Oral BID  . vitamin B-12  1,000 mcg Oral Daily   Continuous Infusions: . lactated ringers 100 mL/hr at 07/24/18 0736     LOS: 0 days    A   [facility]   *Please refer to [email or web address], password [email or web address] to get updated schedule on who will round on this patient, as hospitalists switch teams weekly. If 7PM-7AM, please contact night-coverage at [email or web address], password [email or web address] for any overnight needs.

## 2018-07-25 LAB — CBC WITH DIFFERENTIAL/PLATELET
Abs Immature Granulocytes: 0.02 10*3/uL (ref 0.00–0.07)
Basophils Absolute: 0 10*3/uL (ref 0.0–0.1)
Basophils Relative: 0 %
Eosinophils Absolute: 0 10*3/uL (ref 0.0–0.5)
Eosinophils Relative: 0 %
HEMATOCRIT: 21.2 % — AB (ref 36.0–46.0)
Hemoglobin: 7.9 g/dL — ABNORMAL LOW (ref 12.0–15.0)
Immature Granulocytes: 0 %
Lymphocytes Relative: 19 %
Lymphs Abs: 1 10*3/uL (ref 0.7–4.0)
MCH: 43.9 pg — ABNORMAL HIGH (ref 26.0–34.0)
MCHC: 37.3 g/dL — ABNORMAL HIGH (ref 30.0–36.0)
MCV: 117.8 fL — AB (ref 80.0–100.0)
MONO ABS: 0.8 10*3/uL (ref 0.1–1.0)
Monocytes Relative: 16 %
Neutro Abs: 3.3 10*3/uL (ref 1.7–7.7)
Neutrophils Relative %: 65 %
Platelets: 349 10*3/uL (ref 150–400)
RBC: 1.8 MIL/uL — ABNORMAL LOW (ref 3.87–5.11)
RDW: 24.7 % — ABNORMAL HIGH (ref 11.5–15.5)
WBC: 5.1 10*3/uL (ref 4.0–10.5)
nRBC: 0 % (ref 0.0–0.2)

## 2018-07-25 LAB — BASIC METABOLIC PANEL
Anion gap: 12 (ref 5–15)
BUN: 24 mg/dL — ABNORMAL HIGH (ref 6–20)
CO2: 19 mmol/L — ABNORMAL LOW (ref 22–32)
CREATININE: 1.88 mg/dL — AB (ref 0.44–1.00)
Calcium: 8.8 mg/dL — ABNORMAL LOW (ref 8.9–10.3)
Chloride: 111 mmol/L (ref 98–111)
GFR calc Af Amer: 33 mL/min — ABNORMAL LOW (ref >60)
GFR calc non Af Amer: 29 mL/min — ABNORMAL LOW (ref >60)
Glucose, Bld: 106 mg/dL — ABNORMAL HIGH (ref 70–99)
Potassium: 4.1 mmol/L (ref 3.5–5.1)
Sodium: 142 mmol/L (ref 135–145)

## 2018-07-25 MED ORDER — GUAIFENESIN ER 600 MG PO TB12
600.0000 mg | ORAL_TABLET | Freq: Two times a day (BID) | ORAL | Status: DC
  Administered 2018-07-25 – 2018-08-02 (×17): 600 mg via ORAL
  Filled 2018-07-25 (×17): qty 1

## 2018-07-25 MED ORDER — HYDROXYZINE HCL 25 MG PO TABS
50.0000 mg | ORAL_TABLET | Freq: Once | ORAL | Status: AC
  Administered 2018-07-25: 50 mg via ORAL
  Filled 2018-07-25: qty 2

## 2018-07-25 MED ORDER — BENZONATATE 100 MG PO CAPS
200.0000 mg | ORAL_CAPSULE | Freq: Three times a day (TID) | ORAL | Status: DC | PRN
  Administered 2018-07-25 – 2018-07-31 (×7): 200 mg via ORAL
  Filled 2018-07-25 (×7): qty 2

## 2018-07-25 NOTE — Evaluation (Signed)
Occupational Therapy Evaluation Patient Details Name: Anna [name] MRN: [id number] DOB: 1961-01-29 Today's Date: 07/25/2018    History of Present Illness Patient is a 58 y/o female who presents from kidney MD with AMS. Admitted with acute encephalopathy secondary to possible BZD?  AKI, acidosis? PMH includes bipolar disorder, CHF, CVA, lupus, CKD, HTN.   Clinical Impression   PTA Pt with frequent falls in home environment, mod I for ADL with PRN assist from friends and husband. Pt is currently min A for LB dressing, min guard assist for transfers and sink level grooming. Decreased activity tolerance, decreased balance, decreased cognition, slurred speech, DOE, and decreased safety awareness. She requires skilled OT in the acute setting as well as afterwards at the SNF level to maximize safety and independence in ADL and functional transfers.    Follow Up Recommendations  SNF;Supervision/Assistance - 24 hour    Equipment Recommendations  Other (comment);3 in 1 bedside commode(defer to next venue)    Recommendations for Other Services       Precautions / Restrictions Precautions Precautions: Fall Precaution Comments: watch 02 Restrictions Weight Bearing Restrictions: No      Mobility Bed Mobility Overal bed mobility: Modified Independent             General bed mobility comments: Increased effort, time and heavy use of rail.  Transfers Overall transfer level: Needs assistance Equipment used: Rolling walker (2 wheeled) Transfers: Sit to/from Stand Sit to Stand: Min guard         General transfer comment: vc for safe hand placement, rocking for momentum    Balance Overall balance assessment: History of Falls;Needs assistance Sitting-balance support: Feet supported;No upper extremity supported Sitting balance-Leahy Scale: Good Sitting balance - Comments: Able to reach outside BoS and adjust socks without difficulty.    Standing balance support: During functional  activity;Bilateral upper extremity supported Standing balance-Leahy Scale: Poor Standing balance comment: Reliant on BUEs for support in standing.                            ADL either performed or assessed with clinical judgement   ADL Overall ADL's : Needs assistance/impaired Eating/Feeding: Independent   Grooming: Min guard;Standing Grooming Details (indicate cue type and reason): has to lean against sink  Upper Body Bathing: Set up;Sitting   Lower Body Bathing: Set up;Sitting/lateral leans   Upper Body Dressing : Set up;Sitting   Lower Body Dressing: Minimal assistance;Sit to/from stand Lower Body Dressing Details (indicate cue type and reason): able to access feet to don/doff socks but requires assist for sit<>stand and managing clothing Toilet Transfer: Min guard;Ambulation;RW Toilet Transfer Details (indicate cue type and reason): cues for safety with RW Toileting- Clothing Manipulation and Hygiene: Set up;Sitting/lateral lean       Functional mobility during ADLs: Min guard;Minimal assistance;Rolling walker(hands on min guard) General ADL Comments: decreased balance, decreased safety awareness, decreased activity tolerance, decreased attention     Vision         Perception     Praxis      Pertinent Vitals/Pain Pain Assessment: No/denies pain     Hand Dominance Right   Extremity/Trunk Assessment Upper Extremity Assessment Upper Extremity Assessment: Generalized weakness   Lower Extremity Assessment Lower Extremity Assessment: Defer to PT evaluation RLE Sensation: decreased light touch(knees into feet) LLE Sensation: decreased light touch(knees into feet)       Communication Communication Communication: Expressive difficulties(slurred speech)   Cognition Arousal/Alertness: Awake/alert Behavior During Therapy:  WFL for tasks assessed/performed Overall Cognitive Status: Impaired/Different from baseline Area of Impairment:  Orientation;Memory;Safety/judgement;Awareness                 Orientation Level: Disoriented to;Place;Situation   Memory: Decreased short-term memory   Safety/Judgement: Decreased awareness of safety;Decreased awareness of deficits Awareness: Intellectual   General Comments: tengential conversation   General Comments       Exercises     Shoulder Instructions      Home Living Family/patient expects to be discharged to:: Private residence Living Arrangements: Spouse/significant other Available Help at Discharge: Family;Available PRN/intermittently Type of Home: House Home Access: Stairs to enter Entrance Stairs-Number of Steps: 1   Home Layout: One level;Able to live on main level with bedroom/bathroom     Bathroom Shower/Tub: Walk-in shower   Bathroom Toilet: Handicapped height     Home Equipment: Environmental consultant - 2 wheels;Cane - single point          Prior Functioning/Environment Level of Independence: Needs assistance  Gait / Transfers Assistance Needed: Uses RW vs SPC for ambulation. Reports multiple falls.  ADL's / Homemaking Assistance Needed: Pt reports independent wtih ADLs or her g/fs come over and help as needed.   Comments: Pt's spouse works sometimes starting at 2 am.        OT Problem List: Decreased activity tolerance;Impaired balance (sitting and/or standing);Decreased safety awareness;Decreased cognition      OT Treatment/Interventions: Self-care/ADL training;DME and/or AE instruction;Therapeutic activities;Patient/family education;Balance training    OT Goals(Current goals can be found in the care plan section) Acute Rehab OT Goals Patient Stated Goal: to go home OT Goal Formulation: With patient Time For Goal Achievement: 08/08/18 Potential to Achieve Goals: Good  OT Frequency: Min 2X/week   Barriers to D/C: Decreased caregiver support  husband is supportive but cannot provide 24 hour supervision       Co-evaluation               AM-PAC OT "6 Clicks" Daily Activity     Outcome Measure Help from another person eating meals?: None Help from another person taking care of personal grooming?: A Little Help from another person toileting, which includes using toliet, bedpan, or urinal?: A Little Help from another person bathing (including washing, rinsing, drying)?: A Little Help from another person to put on and taking off regular upper body clothing?: None Help from another person to put on and taking off regular lower body clothing?: A Little 6 Click Score: 20   End of Session Equipment Utilized During Treatment: Gait belt;Rolling walker;Oxygen Nurse Communication: Mobility status  Activity Tolerance: Patient tolerated treatment well Patient left: in bed;with call bell/phone within reach;with bed alarm set  OT Visit Diagnosis: Unsteadiness on feet (R26.81);Other abnormalities of gait and mobility (R26.89);Repeated falls (R29.6);History of falling (Z91.81);Muscle weakness (generalized) (M62.81);Other symptoms and signs involving cognitive function                Time: 3382-5053 OT Time Calculation (min): 21 min Charges:  OT General Charges $OT Visit: 1 Visit OT Evaluation $OT Eval Moderate Complexity: [place] OTR/L Acute Rehabilitation Services Pager: [phone] Office: [place] 07/25/2018, 6:25 PM

## 2018-07-25 NOTE — Evaluation (Signed)
Physical Therapy Evaluation Patient Details Name: Anna [name] MRN: [id number] DOB: 1960-11-22 Today's Date: 07/25/2018   History of Present Illness  Patient is a 58 y/o female who presents from kidney MD with AMS. Admitted with acute encephalopathy secondary to possible BZD?  AKI, acidosis? PMH includes bipolar disorder, CHF, CVA, lupus, CKD, HTN.  Clinical Impression  Patient presents with generalized weakness, slurred speech, decreased memory, decreased safety awareness, dyspnea on exertion, impaired balance and impaired mobility s/p above. Tolerated transfers and gait training with Min A for balance/safety. Pt with bil knee instability with increased distance forced to sit due to weakness. Sp02 dropped to 84% on 3L/min 02. Pt at 73% on RA upon PT arrival sitting EOB, so donned supplemental 02. Rn notified. Pt lives with spouse who works and reports multiple falls at home. Not safe to be home alone. Would benefit from SNF to maximize independence and mobility prior to return home. Will follow acutely.    Follow Up Recommendations SNF;Supervision for mobility/OOB    Equipment Recommendations  None recommended by PT    Recommendations for Other Services       Precautions / Restrictions Precautions Precautions: Fall Precaution Comments: watch 02 Restrictions Weight Bearing Restrictions: No      Mobility  Bed Mobility Overal bed mobility: Modified Independent             General bed mobility comments: Increased effort, time and heavy use of rail.  Transfers Overall transfer level: Needs assistance Equipment used: Rolling walker (2 wheeled) Transfers: Sit to/from Stand Sit to Stand: Min guard         General transfer comment: Min guard for safety. Multiple attempts to stand from EOB with use of momentum. Transferred to chair post ambulation.  Ambulation/Gait Ambulation/Gait assistance: Min assist Gait Distance (Feet): 100 Feet Assistive device: Rolling walker (2  wheeled) Gait Pattern/deviations: Step-through pattern;Decreased stride length;Trunk flexed Gait velocity: decreased   General Gait Details: Slow, unsteady gait with bil knee instability worsened with distance; 2/4 DOE. Taking UE off walker and running into things. Poor safety awareness. Sp02 dropped to 84% on 3L/min 02.  Stairs            Wheelchair Mobility    Modified Rankin (Stroke Patients Only)       Balance Overall balance assessment: History of Falls;Needs assistance Sitting-balance support: Feet supported;No upper extremity supported Sitting balance-Leahy Scale: Good Sitting balance - Comments: ABle to reach outside BoS and adjust socks without difficulty.    Standing balance support: During functional activity;Bilateral upper extremity supported Standing balance-Leahy Scale: Poor Standing balance comment: Reliant on BUEs for support in standing.                              Pertinent Vitals/Pain Pain Assessment: No/denies pain    Home Living Family/patient expects to be discharged to:: Private residence Living Arrangements: Spouse/significant other Available Help at Discharge: Family;Available PRN/intermittently Type of Home: House Home Access: Stairs to enter   Entrance Stairs-Number of Steps: 1 Home Layout: One level;Able to live on main level with bedroom/bathroom Home Equipment: Gilford Rile - 2 wheels;Cane - single point      Prior Function Level of Independence: Needs assistance   Gait / Transfers Assistance Needed: Uses RW vs SPC for ambulation. Reports multiple falls.   ADL's / Homemaking Assistance Needed: Pt reports independent wtih ADLs or her g/fs come over and help as needed.  Comments: Pt's spouse works sometimes  starting at 2 am.     Hand Dominance   Dominant Hand: Right    Extremity/Trunk Assessment   Upper Extremity Assessment Upper Extremity Assessment: Defer to OT evaluation    Lower Extremity Assessment Lower  Extremity Assessment: Generalized weakness;LLE deficits/detail;RLE deficits/detail RLE Sensation: decreased light touch(knees into feet) LLE Sensation: decreased light touch(knees into feet)       Communication   Communication: Expressive difficulties(slurred speech)  Cognition Arousal/Alertness: Awake/alert Behavior During Therapy: WFL for tasks assessed/performed Overall Cognitive Status: Impaired/Different from baseline Area of Impairment: Orientation;Memory;Safety/judgement;Awareness                 Orientation Level: Disoriented to;Place;Situation   Memory: (difficulty recalling conversations had prior to PT arrival)   Safety/Judgement: Decreased awareness of safety;Decreased awareness of deficits Awareness: Intellectual   General Comments: Thinks she is at [facility] and not able to come up with this hospital name, does not know how she got here. Reports this incident happens a lot. Stating random things/questions not related to topic of conversation.       General Comments      Exercises     Assessment/Plan    PT Assessment Patient needs continued PT services  PT Problem List Decreased strength;Decreased mobility;Decreased balance;Impaired sensation;Decreased activity tolerance;Cardiopulmonary status limiting activity;Decreased cognition;Decreased safety awareness       PT Treatment Interventions Functional mobility training;Balance training;Patient/family education;Gait training;Therapeutic activities;Therapeutic exercise;DME instruction    PT Goals (Current goals can be found in the Care Plan section)  Acute Rehab PT Goals Patient Stated Goal: to go home PT Goal Formulation: With patient Time For Goal Achievement: 08/08/18 Potential to Achieve Goals: Good    Frequency Min 3X/week   Barriers to discharge Decreased caregiver support home alone for most of day    Co-evaluation               AM-PAC PT "6 Clicks" Mobility  Outcome Measure Help needed  turning from your back to your side while in a flat bed without using bedrails?: A Little Help needed moving from lying on your back to sitting on the side of a flat bed without using bedrails?: A Little Help needed moving to and from a bed to a chair (including a wheelchair)?: A Little Help needed standing up from a chair using your arms (e.g., wheelchair or bedside chair)?: A Little Help needed to walk in hospital room?: A Little Help needed climbing 3-5 steps with a railing? : A Lot 6 Click Score: 17    End of Session Equipment Utilized During Treatment: Gait belt;Oxygen Activity Tolerance: Treatment limited secondary to medical complications (Comment)(drop in SP02) Patient left: in chair;with call bell/phone within reach;with nursing/sitter in room Nurse Communication: Mobility status;Other (comment)(02 levels) PT Visit Diagnosis: Muscle weakness (generalized) (M62.81);Difficulty in walking, not elsewhere classified (R26.2);Unsteadiness on feet (R26.81)    Time: 7902-4097 PT Time Calculation (min) (ACUTE ONLY): 38 min   Charges:   PT Evaluation $PT Eval Moderate Complexity: 1 Mod PT Treatments $Gait Training: 8-22 mins $Therapeutic Activity: 8-22 mins        Wray Kearns, [place], DPT Acute Rehabilitation Services Pager [phone] Office [place] 07/25/2018, 2:51 PM

## 2018-07-25 NOTE — Clinical Social Work Note (Signed)
Clinical Social Work Assessment  Patient Details  Name: Anna [name] MRN: [id number] Date of Birth: 02-12-1961  Date of referral:  07/25/18               Reason for consult:  Facility Placement(possibly)                Permission sought to share information with:  Family Supports Permission granted to share information::  Yes, Verbal Permission Granted  Name::     Eveleigh Crumpler   Agency::  family  Relationship::  spouse  Contact Information:  Kaleigh Spiegelman [phone]  Housing/Transportation Living arrangements for the past 2 months:  Single Family Home(with spouse ) Source of Information:  Patient Patient Interpreter Needed:  None Criminal Activity/Legal Involvement Pertinent to Current Situation/Hospitalization:  No - Comment as needed Significant Relationships:  Spouse Lives with:  Spouse Do you feel safe going back to the place where you live?  Yes Need for family participation in patient care:  Yes (Comment)  Care giving concerns:  CSW consulted for SNF placement. CSW spoke with pt at bedside to discuss pt's wishes at this time.    Social Worker assessment / plan:  CSW spoke with pt at bedside. CSW was informed that pt is from home with spouse [place]. CSW was informed that per pt pt has been in a facility before as well as had home health. Pt expressed that pt would rather be at home and get home health as opposed to going back to a facility.    CSW expressed that PT would work with pt and form there a recommendation would be made. Pt expressed being understanding of this and suggested that she would see what PT had to say, but would still rather be at home if possible.   During this assessment pt was very pleasant. Pt answered questions at best and engaged in conversation with CSW. Affect was positive and appropriate for information given.  Employment status:  Other (Comment)(unknown at this time. ) Insurance information:  Other (Comment Required)(BCBS) PT  Recommendations:  Not assessed at this time Information / Referral to community resources:  [place]  Patient/Family's Response to care:  Pt's response to care appeared to be understanding and agreeable to plan of care at this time.   Patient/Family's Understanding of and Emotional Response to Diagnosis, Current Treatment, and Prognosis:  No further questions or concerns have been presented to CSW at this time.   Emotional Assessment Appearance:  Appears stated age Attitude/Demeanor/Rapport:  Engaged Affect (typically observed):  Appropriate Orientation:  Oriented to Self, Oriented to Place Alcohol / Substance use:  Not Applicable Psych involvement (Current and /or in the community):  No (Comment)  Discharge Needs  Concerns to be addressed:  Denies Needs/Concerns at this time Readmission within the last 30 days:  No Current discharge risk:  Dependent with Mobility, Lack of support system Barriers to Discharge:  Continued Medical Work up   [employer], [place] 07/25/2018, 9:24 AM

## 2018-07-25 NOTE — Progress Notes (Signed)
PROGRESS NOTE    Anna [name]  UDJ:[id number] DOB: 22-Feb-1961 DOA: 07/23/2018 PCP: [name], MD    Brief Narrative:  Anna [name] is a 58 y.o. female with medical history significant of CVA: HLD; SLE; hypothyroidism; HTN; bipolar d/o;  and CHF presenting with AMS.  "I wasn't.  I just said, um, supposed to have taken some pills.  Dr. Orinda Kenner, specialist, and yesterday I didn't.  So he told me to come back today.  So I didn't want to, nothing, and see what's going on....  I said I did it again."  She was in 3 hospitals last year for the same presentation.  Her husband talked to her this AM and she wasn't too coherent this morning, he thinks maybe a couple of days of symptoms.  Her last normal was about 12pm one day this week but that evening she wasn't able to talk again, "in a zone."  Family thinks it is associated with her medications.  Her sister is concerned about overdosing.  Family is unable to monitor or distribute medication.  She was admitted 12/10-13 with a similar presentation with acute encephalopathy.  This resolved completely and she had an extensive and unremarkable evaluation.  This was attributed to AKI with metabolic acidosis and she improved with supportive care.  She was seen on 1/14 by psychiatry for anxiety/bipolar and was continued on her home meds - Lexapro 30 mg daily; Valium 10 mg PO TID prn; and Seroquel 400 mg qhs.  ED Course:  This happens intermittently - ?BZD.  AKI, acidosis - ?alternative cause.  Family is concerned that she is not safe to go home.   Assessment & Plan:   Principal Problem:   AMS (altered mental status) Active Problems:   SLE (systemic lupus erythematosus) (HCC)   Hypothyroid   Essential hypertension, benign   Acute renal failure superimposed on stage 3 chronic kidney disease (HCC)   RTA (renal tubular acidosis)   Chronic diastolic CHF (congestive heart failure) (HCC)   Bipolar disease, chronic (HCC)   AMS -frequent admissions for  this - no family bedside -She was admitted for this multiple times in the last year, including in 12/19 -Evaluation thus far unremarkable other than recurrent acidosis and renal failure (see below) -Suspect medication misadventure, likely associated with Valium overusage, continue to hold - bicarbonate noncompliance is also a likely issue - CSW consulted - f/u PT eval - continue safety sitter - not safe for discharge home -She may require placement if she does not show rapid improvement since the husband works starting about 2AM and the sister lives on the other side of town and there is no one available to help the patient manage her medications  Acute renal failure on stage 3 CKD -Once again she returns with renal failure superimposed on CKD -minimal improvement in Cr -Continue Calcitriol  Renal tubular acidosis -Her husband reports that he saw a bottle of bicarb that "looked pretty full" on the shelf and so he is not certain that she is taking this medication -Will give an amp of bicarbonate via IV and then transition back to PO HCO3 in AM -It appears that the spironolactone may have been resumed; this will be held and should be considered for discontinuation  HTN -Continue Norvasc, Toprol XL  Hypothyroidism -Normal TSH on 12/12 -Continue Synthroid at current dose for now  Lupus -Imuran, Plaquenil, Prednisone -Continue folate -Again there is no indication to suggest lupus flare as the cause of the AMS and  she had a negative MRI during her last hospitalization with similar presentation  Chronic diastolic CHF -Appears to be compensated at this time -Normal echo on 07/13/17 -Prior echo in 1/18 showed grade 2 diastolic dysfunction -no need for repeat echo  Bipolar d/o -Continue Lexapro and Seroquel -Hold Valium -Consider taper off this medication to eliminate possible etiologies for recurrent admissions - follow up with psych outpatient  Chronic cough -Family  reports that she has had this cough for approximately 1 year -She is on immunosuppressant medication and so is at increased risk for various infections - previous CXR was stable -CT on 06/15/18 showed extensive changes of COPD   DVT prophylaxis: lovenox Code Status: full Family Communication: no family bedside Disposition Plan: unclear- pending PT eval   Consultants:   SW  PT  Procedures:   None  Antimicrobials:   None    Subjective: Patient sitting watching television.  Reports she doesn't know how she got to the hospital.  She mentions she would like to go home when the time comes.  Doesn't recall seeing Education officer, museum.  No complaints.  Objective: Vitals:   07/24/18 2358 07/25/18 0413 07/25/18 0732 07/25/18 0733  BP: 100/61 140/87  (!) 153/97  Pulse: 88 89  94  Resp: 16 16  18   Temp: 99.1 F (37.3 C) 98.9 F (37.2 C)  99 F (37.2 C)  TempSrc: Oral Oral  Oral  SpO2: 96% 95% (!) 85% 95%  Weight:      Height:        Intake/Output Summary (Last 24 hours) at 07/25/2018 1207 Last data filed at 07/25/2018 1517 Gross per 24 hour  Intake 2072.75 ml  Output -  Net 2072.75 ml   Filed Weights   07/24/18 0611  Weight: 80 kg    Examination:  General exam: Appears calm and comfortable  Respiratory system: Clear to auscultation. Respiratory effort normal. Cardiovascular system: S1 & S2 heard, RRR. No JVD, murmurs, rubs, gallops or clicks. No pedal edema. Gastrointestinal system: Abdomen is nondistended, soft and nontender. No organomegaly or masses felt. Normal bowel sounds heard. Central nervous system: Alert and oriented to person and place.  Patient is not oriented to time or situation. No focal neurological deficits. Extremities: Symmetric 5 x 5 power. Skin: No rashes, lesions or ulcers Psychiatry: Poor insight. Somewhat guarded affect    Data Reviewed: I have personally reviewed following labs and imaging studies  CBC: Recent Labs  Lab 07/23/18 0954  07/23/18 1032 07/24/18 1005 07/25/18 0637  WBC 4.9  --  4.5 5.1  NEUTROABS 3.7  --   --  3.3  HGB 9.0* 8.8* 8.5* 7.9*  HCT 26.3* 26.0* 22.2* 21.2*  MCV 123.5*  --  118.7* 117.8*  PLT 423*  --  378 616   Basic Metabolic Panel: Recent Labs  Lab 07/23/18 0954 07/23/18 1032 07/24/18 1005 07/25/18 0637  NA 144 144 142 142  K 5.1 5.1 4.3 4.1  CL 118* 124* 115* 111  CO2 14*  --  17* 19*  GLUCOSE 109* 103* 113* 106*  BUN 34* 33* 26* 24*  CREATININE 2.57* 2.80* 2.14* 1.88*  CALCIUM 9.0  --  9.0 8.8*   GFR: Estimated Creatinine Clearance: 35.1 mL/min (A) (by C-G formula based on SCr of 1.88 mg/dL (H)). Liver Function Tests: Recent Labs  Lab 07/23/18 0954  AST 30  ALT 17  ALKPHOS 63  BILITOT 0.3  PROT 7.4  ALBUMIN 3.9   No results for input(s): LIPASE, AMYLASE in the  last 168 hours. Recent Labs  Lab 07/23/18 1545  AMMONIA 20   Coagulation Profile: No results for input(s): INR, PROTIME in the last 168 hours. Cardiac Enzymes: No results for input(s): CKTOTAL, CKMB, CKMBINDEX, TROPONINI in the last 168 hours. BNP (last 3 results) No results for input(s): PROBNP in the last 8760 hours. HbA1C: No results for input(s): HGBA1C in the last 72 hours. CBG: Recent Labs  Lab 07/23/18 0945  GLUCAP 114*   Lipid Profile: No results for input(s): CHOL, HDL, LDLCALC, TRIG, CHOLHDL, LDLDIRECT in the last 72 hours. Thyroid Function Tests: No results for input(s): TSH, T4TOTAL, FREET4, T3FREE, THYROIDAB in the last 72 hours. Anemia Panel: Recent Labs    07/23/18 1545  VITAMINB12 359  FERRITIN 130  TIBC 284  IRON 39  RETICCTPCT 5.9*   Sepsis Labs: Recent Labs  Lab 07/23/18 1033  LATICACIDVEN 0.76    Recent Results (from the past 240 hour(s))  Urine culture     Status: Abnormal   Collection Time: 07/23/18  1:42 PM  Result Value Ref Range Status   Specimen Description URINE, CLEAN CATCH  Final   Special Requests   Final    NONE Performed at [facility], [place] [address], [place], [place] [address]    Culture MULTIPLE SPECIES PRESENT, SUGGEST RECOLLECTION (A)  Final   Report Status 07/24/2018 FINAL  Final  Culture, blood (routine x 2)     Status: None (Preliminary result)   Collection Time: 07/23/18  7:12 PM  Result Value Ref Range Status   Specimen Description BLOOD LEFT HAND  Final   Special Requests   Final    BOTTLES DRAWN AEROBIC ONLY Blood Culture adequate volume   Culture   Final    NO GROWTH < 24 HOURS Performed at [facility], [place] [address], [place], [place] [address]    Report Status PENDING  Incomplete  Culture, blood (routine x 2)     Status: None (Preliminary result)   Collection Time: 07/23/18  7:12 PM  Result Value Ref Range Status   Specimen Description BLOOD LEFT HAND  Final   Special Requests   Final    BOTTLES DRAWN AEROBIC ONLY Blood Culture adequate volume   Culture   Final    NO GROWTH < 24 HOURS Performed at [facility], [place] [address], [place], [place] [address]    Report Status PENDING  Incomplete         Radiology Studies: No results found.      Scheduled Meds: . amLODipine  10 mg Oral Daily  . aspirin EC  81 mg Oral Daily  . atorvastatin  20 mg Oral Daily  . azaTHIOprine  50 mg Oral BID  . calcitRIOL  0.25 mcg Oral Daily  . docusate sodium  100 mg Oral BID  . enoxaparin (LOVENOX) injection  40 mg Subcutaneous Q24H  . escitalopram  30 mg Oral Daily  . folic acid  1 mg Oral Daily  . guaiFENesin  600 mg Oral BID  . hydroxychloroquine  200 mg Oral Daily  . levothyroxine  50 mcg Oral Q0600  . metoprolol succinate  50 mg Oral Daily  . pantoprazole  40 mg Oral Daily  . polyethylene glycol  17 g Oral BID  . predniSONE  10 mg Oral Q breakfast  . QUEtiapine  400 mg Oral QHS  . sodium bicarbonate  1,300 mg Oral BID  . vitamin B-12  1,000 mcg Oral Daily  Continuous Infusions: . lactated ringers 100 mL/hr at 07/25/18 0715     LOS: 1 day    Time spent: 40  minutes    Loretha Stapler, MD [facility] Pager [phone]  If 7PM-7AM, please contact night-coverage [email or web address] Password [facility] 07/25/2018, 12:07 PM

## 2018-07-26 ENCOUNTER — Inpatient Hospital Stay (HOSPITAL_COMMUNITY): Payer: BLUE CROSS/BLUE SHIELD

## 2018-07-26 DIAGNOSIS — J9601 Acute respiratory failure with hypoxia: Secondary | ICD-10-CM

## 2018-07-26 LAB — BLOOD GAS, ARTERIAL
Acid-base deficit: 0.3 mmol/L (ref 0.0–2.0)
Bicarbonate: 24.5 mmol/L (ref 20.0–28.0)
Drawn by: 275531
FIO2: 100
O2 Saturation: 83.7 %
PH ART: 7.354 (ref 7.350–7.450)
Patient temperature: 98.6
pCO2 arterial: 45.2 mmHg (ref 32.0–48.0)
pO2, Arterial: 53.2 mmHg — ABNORMAL LOW (ref 83.0–108.0)

## 2018-07-26 MED ORDER — IPRATROPIUM-ALBUTEROL 0.5-2.5 (3) MG/3ML IN SOLN
3.0000 mL | Freq: Four times a day (QID) | RESPIRATORY_TRACT | Status: DC
  Administered 2018-07-26 – 2018-07-27 (×3): 3 mL via RESPIRATORY_TRACT
  Filled 2018-07-26 (×3): qty 3

## 2018-07-26 MED ORDER — METHYLPREDNISOLONE SODIUM SUCC 125 MG IJ SOLR
60.0000 mg | Freq: Three times a day (TID) | INTRAMUSCULAR | Status: DC
  Administered 2018-07-26 – 2018-07-27 (×4): 60 mg via INTRAVENOUS
  Filled 2018-07-26 (×4): qty 2

## 2018-07-26 MED ORDER — FUROSEMIDE 10 MG/ML IJ SOLN
40.0000 mg | Freq: Once | INTRAMUSCULAR | Status: AC
  Administered 2018-07-26: 40 mg via INTRAVENOUS
  Filled 2018-07-26: qty 4

## 2018-07-26 MED ORDER — PIPERACILLIN-TAZOBACTAM 3.375 G IVPB
3.3750 g | Freq: Three times a day (TID) | INTRAVENOUS | Status: DC
  Administered 2018-07-26 – 2018-07-29 (×9): 3.375 g via INTRAVENOUS
  Filled 2018-07-26 (×10): qty 50

## 2018-07-26 MED ORDER — DOXYCYCLINE HYCLATE 100 MG PO TABS
100.0000 mg | ORAL_TABLET | Freq: Two times a day (BID) | ORAL | Status: DC
  Administered 2018-07-26: 100 mg via ORAL

## 2018-07-26 MED ORDER — SODIUM CHLORIDE 0.9 % IV SOLN
INTRAVENOUS | Status: DC | PRN
  Administered 2018-07-26 – 2018-07-27 (×2): 250 mL via INTRAVENOUS

## 2018-07-26 MED ORDER — MORPHINE SULFATE (PF) 2 MG/ML IV SOLN
2.0000 mg | INTRAVENOUS | Status: DC | PRN
  Administered 2018-07-26 – 2018-07-28 (×4): 2 mg via INTRAVENOUS
  Filled 2018-07-26 (×5): qty 1

## 2018-07-26 MED ORDER — SODIUM CHLORIDE 0.9 % IV SOLN: INTRAVENOUS | Status: DC

## 2018-07-26 MED ORDER — VANCOMYCIN HCL IN DEXTROSE 1-5 GM/200ML-% IV SOLN
1000.0000 mg | INTRAVENOUS | Status: DC
  Administered 2018-07-26: 1000 mg via INTRAVENOUS
  Filled 2018-07-26 (×3): qty 200

## 2018-07-26 NOTE — Progress Notes (Signed)
PROGRESS NOTE    Anna [name]  [id number]:[id number] DOB: 1961/05/06 DOA: 07/23/2018 PCP: [name], MD   Brief Narrative: Patient is a 58 year old female with history of CVA, SLE, hyperlipidemia, hypothyroidism, hypertension, bipolar disorder, CHF who presented to the emergency department with altered mental status.  She has multiple admissions in the past with a similar scenerio.  Her altered mental status was attributed to benzodiazepines.  Patient was also found to be in acute kidney injury on presentation.  She became hypoxic last night and had to be put on nonrebreather.  Assessment & Plan:   Principal Problem:   AMS (altered mental status) Active Problems:   SLE (systemic lupus erythematosus) (HCC)   Hypothyroid   Essential hypertension, benign   Acute renal failure superimposed on stage 3 chronic kidney disease (HCC)   RTA (renal tubular acidosis)   Chronic diastolic CHF (congestive heart failure) (HCC)   Bipolar disease, chronic (HCC)  Acute hypoxic respiratory failure: Noticed to be hypoxic since last night.  Currently on 100% nonrebreather.  Desaturated on nasal cannula. Auscultation revealed bilateral expiratory wheezes.  Most likely this is from her COPD.  We will get a CT chest to rule out any pneumonia. We will get a stat ABG.  If it shows hypercarbia, will start her on BiPAP. This x-ray done on 1/17 had some COPD changes versus atypical pneumonia.  Acute COPD exacerbation: Smokes a pack a day since last several years.  Auscultated to have bilateral expiratory wheezes.  Continue bronchodilators and steroids.  Start on doxycycline.  Altered mental status: Has been admitted in the past with similar complaints more than once.  Her UDS was positive for benzodiazepine.  She is still weak and looks lethargic but she is currently alert and oriented.  We will continue to monitor mental status.  Acute kidney injury on CKD stage III: Her baseline creatinine is around 1.5.   Presented with creatinine of more than 2.  She does not look volume overloaded.  We will start her on gentle IV fluids.  Will check ultrasound of the kidneys and bladder.  Her CKD might have advanced due to her SLE. She needs to  follow-up with nephrology on discharge.  SLE: Continue Imuran, Plaquenil and prednisone.  Also on folic acid.  Microcytic anemia: MCV of 112.  Vitamin B12 level normal.  Already on folic acid supplementation and will continue those.  Her hemoglobin this morning is 7.9.  Iron studies shows normal iron level, ferritin and iron saturation.  No evidence of active bleeding.  History of renal tubular acidosis: On bicarb tablets at home.  Will resume those.  Hypertension: Continue amlodipine, Toprol.  Currently blood pressure stable.  History of chronic diastolic CHF: Currently compensated.  Actually patient looks volume depleted currently.  Continue gentle IV fluids.  History of bipolar disorder: On Lexapro and Seroquel which we will continue.  On Valium at home which could have made her altered.  Hold Valium.  We will also discontinue Seroquel because of her drowsiness  Hypothyroidism: Continue Synthyroid  Deconditioning/debility: Physical therapy and Occupational Therapy saw the patient antibiotics nursing facility on discharge.  Social worker consulted        DVT prophylaxis: SCD Code Status: Full Family Communication: None present at the bedside Disposition Plan: Skilled nursing facility after she is medically stabilized  Consultants:   Procedures:  Antimicrobials:  Anti-infectives (From admission, onward)   Start     Dose/Rate Route Frequency Ordered Stop   07/24/18 1000  hydroxychloroquine (PLAQUENIL)  tablet 200 mg     200 mg Oral Daily 07/23/18 1601        Subjective: Patient seen and examined at bedside this morning.  Reported to be desaturated last night.  Currently on 100% nonrebreather.  She looks weak and lethargic but alert and oriented.   Denies any chest pain.  Objective: Vitals:   07/25/18 1942 07/26/18 0023 07/26/18 0403 07/26/18 0800  BP: 123/87 113/71 (!) 140/93 (!) 146/98  Pulse: 90 90 81 84  Resp: 16 16    Temp: 98.9 F (37.2 C) 98.2 F (36.8 C) (!) 97.3 F (36.3 C) (!) 97.3 F (36.3 C)  TempSrc: Oral Oral Oral Axillary  SpO2: 91% 92% 96%   Weight:      Height:        Intake/Output Summary (Last 24 hours) at 07/26/2018 1024 Last data filed at 07/26/2018 0600 Gross per 24 hour  Intake 2964.49 ml  Output -  Net 2964.49 ml   Filed Weights   07/24/18 0611  Weight: 80 kg    Examination:  General exam: Generalized weakness, chronically ill looking HEENT:PERRL,Oral mucosa dry, Ear/Nose normal on gross exam Respiratory system: Bilateral expiratory wheezes Cardiovascular system: S1 & S2 heard, RRR. No JVD, murmurs, rubs, gallops or clicks. No pedal edema. Gastrointestinal system: Abdomen is nondistended, soft and nontender. No organomegaly or masses felt. Normal bowel sounds heard. Central nervous system: Alert and oriented. No focal neurological deficits. Extremities: No edema, no clubbing ,no cyanosis, distal peripheral pulses palpable. Skin: No rashes, lesions or ulcers,no icterus ,no pallor iate.     Data Reviewed: I have personally reviewed following labs and imaging studies  CBC: Recent Labs  Lab 07/23/18 0954 07/23/18 1032 07/24/18 1005 07/25/18 0637  WBC 4.9  --  4.5 5.1  NEUTROABS 3.7  --   --  3.3  HGB 9.0* 8.8* 8.5* 7.9*  HCT 26.3* 26.0* 22.2* 21.2*  MCV 123.5*  --  118.7* 117.8*  PLT 423*  --  378 161   Basic Metabolic Panel: Recent Labs  Lab 07/23/18 0954 07/23/18 1032 07/24/18 1005 07/25/18 0637  NA 144 144 142 142  K 5.1 5.1 4.3 4.1  CL 118* 124* 115* 111  CO2 14*  --  17* 19*  GLUCOSE 109* 103* 113* 106*  BUN 34* 33* 26* 24*  CREATININE 2.57* 2.80* 2.14* 1.88*  CALCIUM 9.0  --  9.0 8.8*   GFR: Estimated Creatinine Clearance: 35.1 mL/min (A) (by C-G formula based  on SCr of 1.88 mg/dL (H)). Liver Function Tests: Recent Labs  Lab 07/23/18 0954  AST 30  ALT 17  ALKPHOS 63  BILITOT 0.3  PROT 7.4  ALBUMIN 3.9   No results for input(s): LIPASE, AMYLASE in the last 168 hours. Recent Labs  Lab 07/23/18 1545  AMMONIA 20   Coagulation Profile: No results for input(s): INR, PROTIME in the last 168 hours. Cardiac Enzymes: No results for input(s): CKTOTAL, CKMB, CKMBINDEX, TROPONINI in the last 168 hours. BNP (last 3 results) No results for input(s): PROBNP in the last 8760 hours. HbA1C: No results for input(s): HGBA1C in the last 72 hours. CBG: Recent Labs  Lab 07/23/18 0945  GLUCAP 114*   Lipid Profile: No results for input(s): CHOL, HDL, LDLCALC, TRIG, CHOLHDL, LDLDIRECT in the last 72 hours. Thyroid Function Tests: No results for input(s): TSH, T4TOTAL, FREET4, T3FREE, THYROIDAB in the last 72 hours. Anemia Panel: Recent Labs    07/23/18 1545  VITAMINB12 359  FERRITIN 130  TIBC 284  IRON 39  RETICCTPCT 5.9*   Sepsis Labs: Recent Labs  Lab 07/23/18 1033  LATICACIDVEN 0.76    Recent Results (from the past 240 hour(s))  Urine culture     Status: Abnormal   Collection Time: 07/23/18  1:42 PM  Result Value Ref Range Status   Specimen Description URINE, CLEAN CATCH  Final   Special Requests   Final    NONE Performed at [facility], [place] [address], [place], [place] [address]    Culture MULTIPLE SPECIES PRESENT, SUGGEST RECOLLECTION (A)  Final   Report Status 07/24/2018 FINAL  Final  Culture, blood (routine x 2)     Status: None (Preliminary result)   Collection Time: 07/23/18  7:12 PM  Result Value Ref Range Status   Specimen Description BLOOD LEFT HAND  Final   Special Requests   Final    BOTTLES DRAWN AEROBIC ONLY Blood Culture adequate volume   Culture   Final    NO GROWTH 3 DAYS Performed at [facility], [place] [address], [place], [place] [address]    Report Status PENDING  Incomplete  Culture, blood  (routine x 2)     Status: None (Preliminary result)   Collection Time: 07/23/18  7:12 PM  Result Value Ref Range Status   Specimen Description BLOOD LEFT HAND  Final   Special Requests   Final    BOTTLES DRAWN AEROBIC ONLY Blood Culture adequate volume   Culture   Final    NO GROWTH 3 DAYS Performed at [facility], [address], [place], [place] [address]    Report Status PENDING  Incomplete         Radiology Studies: No results found.      Scheduled Meds: . amLODipine  10 mg Oral Daily  . aspirin EC  81 mg Oral Daily  . atorvastatin  20 mg Oral Daily  . azaTHIOprine  50 mg Oral BID  . calcitRIOL  0.25 mcg Oral Daily  . docusate sodium  100 mg Oral BID  . escitalopram  30 mg Oral Daily  . folic acid  1 mg Oral Daily  . guaiFENesin  600 mg Oral BID  . hydroxychloroquine  200 mg Oral Daily  . ipratropium-albuterol  3 mL Nebulization Q6H  . levothyroxine  50 mcg Oral Q0600  . methylPREDNISolone (SOLU-MEDROL) injection  60 mg Intravenous Q8H  . metoprolol succinate  50 mg Oral Daily  . pantoprazole  40 mg Oral Daily  . polyethylene glycol  17 g Oral BID  . sodium bicarbonate  1,300 mg Oral BID  . vitamin B-12  1,000 mcg Oral Daily   Continuous Infusions: . sodium chloride    . lactated ringers 100 mL/hr at 07/26/18 0600     LOS: 2 days    Time spent: 35 mins.More than 50% of that time was spent in counseling and/or coordination of care.      Shelly Coss, MD [facility] Pager [phone]  If 7PM-7AM, please contact night-coverage [email or web address] Password [facility] 07/26/2018, 10:24 AM

## 2018-07-26 NOTE — Progress Notes (Signed)
Pharmacy Antibiotic Note  Anna [name] is a 58 y.o. female with hx of lupus, on immunosuppression admitted on 07/23/2018 with pneumonia and acute hypoxic respiratory failure. Pharmacy has been consulted for vancomycin and zosyn dosing.  Scr 1.88, improved compared with 2.8 on admission, but probably still slightly above baseline. Current est. crcl ~ 35 ml/min  Plan: Vancomycin 1g IV Q 24 hrs Zosyn 3.375g IV Q 8 hrs (4 hr infusion) Monitor renal function Vancomycin trough at steady state  Height: 5\' 7"  (170.2 cm) Weight: 176 lb 5.9 oz (80 kg) IBW/kg (Calculated) : 61.6  Temp (24hrs), Avg:98 F (36.7 C), Min:97.3 F (36.3 C), Max:98.9 F (37.2 C)  Recent Labs  Lab 07/23/18 0954 07/23/18 1032 07/23/18 1033 07/24/18 1005 07/25/18 0637  WBC 4.9  --   --  4.5 5.1  CREATININE 2.57* 2.80*  --  2.14* 1.88*  LATICACIDVEN  --   --  0.76  --   --     Estimated Creatinine Clearance: 35.1 mL/min (A) (by C-G formula based on SCr of 1.88 mg/dL (H)).    No Known Allergies  Antimicrobials this admission: Vancomycin 1/20>>  Zosyn 1/20 >>  Doxy 1/20 x 1  Dose adjustments this admission:   Microbiology results: 1/17 BCx: ngtd 1/17 UCx: multiple species likely contaminated   Thank you for allowing pharmacy to be a part of this patient's care.  Maryanna Shape, PharmD, BCPS, BCPPS Clinical Pharmacist  Pager: [phone]    07/26/2018 4:23 PM

## 2018-07-26 NOTE — Consult Note (Signed)
NAME:  Anna [name], MRN:  [id number], DOB:  [birth year], LOS: 2 ADMISSION DATE:  07/23/2018, CONSULTATION DATE:  1/20 REFERRING MD:  [facility] , CHIEF COMPLAINT:  Abnormal chest CT    Brief History   58yo female smoker with hx SLE on imuran, COPD, HTN, bipolar, CHF admitted 1/17 with AMS thought r/t benzos with multiple recent admissions for the same. Also found to have AKI. Overnight 1/19 had worsening hypoxia and CT chest performed to further eval which revealed ?R hilar mass and PCCM consulted.   History of present illness   58yo female smoker with hx SLE on imuran, COPD, HTN, bipolar, CHF with multiple recent admissions with encephalopathy thought r/t benzos presented again 1/17 with AMS.  Overnight 1/19 had increasing hypoxia and PCCM consulted.  She endorses cough, aches, ?fever, yellow sputum, BLE edema, abd fullness, DOE   Past Medical History   has a past medical history of Acute CHF ([place]) (07/2016), Depressive disorder, not elsewhere classified, Family history of adverse reaction to anesthesia, Hypertension, Hypothyroid, Kidney stone, Lupus (systemic lupus erythematosus) ([facility]), Migraine, Pure hypercholesterolemia, and Stroke ([place]).   Significant Hospital Events     Consults:  none  Procedures:    Significant Diagnostic Tests:  CT chest 1/20>>> 1. The appearance of the chest is concerning for potential right hilar mass with associated right hilar lymphadenopathy. At this time, there is what appears to be asymmetric edema involving the right lower and middle lobes with small pleural effusion tracking in the right major fissure. Further evaluation with contrast enhanced chest CT is recommended to better evaluate these findings if there is clinical concern for malignancy. 2. Aortic atherosclerosis. 3. Diffuse bronchial wall thickening with moderate centrilobular and mild paraseptal emphysema; imaging findings suggestive of underlying COPD. CT head 1/17>>> neg acute   Micro  Data:  BC x 2 1/17>>>  Antimicrobials:  Doxycycline 1/20>>>   Interim history/subjective:  C/o cough, DOE. Feels comfortable at rest. Lethargic on entry into room but once wakes she is awake and cooperative.   Objective   Blood pressure (!) 152/86, pulse 99, temperature 98.3 F (36.8 C), temperature source Oral, resp. rate 19, height 5\' 7"  (1.702 m), weight 80 kg, last menstrual period 07/07/2002, SpO2 99 %.        Intake/Output Summary (Last 24 hours) at 07/26/2018 1550 Last data filed at 07/26/2018 0600 Gross per 24 hour  Intake 2964.49 ml  Output -  Net 2964.49 ml   Filed Weights   07/24/18 0611  Weight: 80 kg    Examination: General: adult female lying in bed on 10L O2 via face mask HEENT: MM pink/moist, partial-NRB mask in place Neuro: Asleep upon entering room, awakens to stimulation, snoring respirations prior to wakening, interacts / appropriate but is sedate CV: s1s2 rrr, no m/r/g PULM: even/non-labored, lungs bilaterally clear anterior, faint basilar crackles on right UT:MLYY, non-tender, bsx4 active  Extremities: warm/dry, trace LE edema / appearance of chronic venous stasis  Skin: no rashes or lesions  Resolved Hospital Problem list     Assessment & Plan:  Acute Hypoxic Respiratory Failure  -Right hilar mass, possible right sided edema  -TSH wnl P: Wean O2 for sats > 90-95% Pulmonary hygiene - IS, mobilize Assess ECHO for right sided involvement, r/o CHF Lasix 40 mg IV x1 ambulatory O2 assessment prior to d/c   Concern for Right Hilar Mass with LAN P: Consider repeat imaging now with IV contrast but marginal renal function barrier  Treat as HCAP given immune  suppression and follow up in office in 4 weeks with repeat CT imaging  D1/x abx, consider 7-10 days therapy  Will need outpatient pulmonary follow up    Emphysema / COPD  -extensive emphysematous changes, peribronchovascular thickening  P: Duoneb Q6  Smoking cessation counseling    Suspected OSA  -noted to be snoring upon entering room  P: Consider outpatient sleep evaluation    Lupus  P: Continue Imuran + Plaquenil    CKD  P: Trend BMP / urinary output Replace electrolytes as indicated Avoid nephrotoxic agents, ensure adequate renal perfusion   Labs   CBC: Recent Labs  Lab 07/23/18 0954 07/23/18 1032 07/24/18 1005 07/25/18 0637  WBC 4.9  --  4.5 5.1  NEUTROABS 3.7  --   --  3.3  HGB 9.0* 8.8* 8.5* 7.9*  HCT 26.3* 26.0* 22.2* 21.2*  MCV 123.5*  --  118.7* 117.8*  PLT 423*  --  378 932    Basic Metabolic Panel: Recent Labs  Lab 07/23/18 0954 07/23/18 1032 07/24/18 1005 07/25/18 0637  NA 144 144 142 142  K 5.1 5.1 4.3 4.1  CL 118* 124* 115* 111  CO2 14*  --  17* 19*  GLUCOSE 109* 103* 113* 106*  BUN 34* 33* 26* 24*  CREATININE 2.57* 2.80* 2.14* 1.88*  CALCIUM 9.0  --  9.0 8.8*   GFR: Estimated Creatinine Clearance: 35.1 mL/min (A) (by C-G formula based on SCr of 1.88 mg/dL (H)). Recent Labs  Lab 07/23/18 0954 07/23/18 1033 07/24/18 1005 07/25/18 0637  WBC 4.9  --  4.5 5.1  LATICACIDVEN  --  0.76  --   --     Liver Function Tests: Recent Labs  Lab 07/23/18 0954  AST 30  ALT 17  ALKPHOS 63  BILITOT 0.3  PROT 7.4  ALBUMIN 3.9   No results for input(s): LIPASE, AMYLASE in the last 168 hours. Recent Labs  Lab 07/23/18 1545  AMMONIA 20    ABG    Component Value Date/Time   PHART 7.354 07/26/2018 1323   PCO2ART 45.2 07/26/2018 1323   PO2ART 53.2 (L) 07/26/2018 1323   HCO3 24.5 07/26/2018 1323   TCO2 16 (L) 07/23/2018 1039   ACIDBASEDEF 0.3 07/26/2018 1323   O2SAT 83.7 07/26/2018 1323     Coagulation Profile: No results for input(s): INR, PROTIME in the last 168 hours.  Cardiac Enzymes: No results for input(s): CKTOTAL, CKMB, CKMBINDEX, TROPONINI in the last 168 hours.  HbA1C: No results found for: HGBA1C  CBG: Recent Labs  Lab 07/23/18 0945  GLUCAP 114*    Review of Systems:   As per  HPI - All other systems reviewed and were neg.   Past Medical History  She,  has a past medical history of Acute CHF ([place]) (07/2016), Depressive disorder, not elsewhere classified, Family history of adverse reaction to anesthesia, Hypertension, Hypothyroid, Kidney stone, Lupus (systemic lupus erythematosus) ([place]), Migraine, Pure hypercholesterolemia, and Stroke ([place]).   Surgical History    Past Surgical History:  Procedure Laterality Date  . CESAREAN SECTION    . ECTOPIC PREGNANCY SURGERY     times 2  . JOINT REPLACEMENT    . LAPAROSCOPY     with rt salpingectomy  . [place] SURGERY  2003  . TONSILLECTOMY    . TOTAL ABDOMINAL HYSTERECTOMY  2004   ovaries retained, DUB  . [place]     Social History   reports that she has been smoking cigarettes. She has a  17.50 pack-year smoking history. She has never used smokeless tobacco. She reports current alcohol use. She reports that she does not use drugs.   Family History   Her family history includes Diabetes in her maternal grandmother and mother; Hypertension in her sister. There is no history of Autoimmune disease.   Allergies No Known Allergies   Home Medications  Prior to Admission medications   Medication Sig Start Date End Date Taking? Authorizing Provider  amLODipine (NORVASC) 10 MG tablet Take 10 mg by mouth daily. 04/17/18  Yes [provider]  aspirin EC 325 MG tablet Take 325 mg by mouth 2 (two) times daily as needed (arthritis pain).   Yes [provider]  aspirin EC 81 MG tablet Take 1 tablet (81 mg total) by mouth daily. 06/18/18 06/18/19 Yes Ghimire, Henreitta Leber, MD  atorvastatin (LIPITOR) 20 MG tablet Take 20 mg by mouth daily.   Yes [provider]  azaTHIOprine (IMURAN) 50 MG tablet Take 50 mg by mouth 2 (two) times daily.  02/20/17  Yes [provider]  calcitRIOL (ROCALTROL) 0.25 MCG capsule Take 0.25 mcg by mouth daily. 04/21/18  Yes [provider]   diazepam (VALIUM) 10 MG tablet Take 1 tablet (10 mg total) by mouth every 8 (eight) hours as needed for anxiety. 07/20/18  Yes Hurst, Dorothea Glassman, PA-C  diphenhydrAMINE (BENADRYL) 25 MG tablet Take 25-50 mg by mouth at bedtime as needed for sleep.    Yes [provider]  escitalopram (LEXAPRO) 10 MG tablet Take 3 tablets (30 mg total) by mouth daily. 07/20/18  Yes Donnal Moat T, PA-C  folic acid (FOLVITE) 1 MG tablet Take 1 mg by mouth daily.   Yes [provider]  hydroxychloroquine (PLAQUENIL) 200 MG tablet Take 200 mg by mouth daily.  02/27/17  Yes [provider]  IRON PO Take 1 tablet by mouth daily.   Yes [provider]  levothyroxine (SYNTHROID, LEVOTHROID) 50 MCG tablet Take 50 mcg by mouth daily before breakfast.   Yes [provider]  metoprolol succinate (TOPROL-XL) 50 MG 24 hr tablet Take 50 mg by mouth daily. Take with or immediately following a meal.   Yes [provider]  omeprazole (PRILOSEC) 20 MG capsule Take 20 mg by mouth daily.   Yes [provider]  ondansetron (ZOFRAN) 4 MG tablet Take 1 tablet (4 mg total) by mouth every 6 (six) hours as needed for nausea. 02/13/18  Yes Mariel Aloe, MD  polyethylene glycol (MIRALAX / GLYCOLAX) packet Take 17 g by mouth 2 (two) times daily. Patient taking differently: Take 17 g by mouth daily as needed (constipation).  02/13/18  Yes Mariel Aloe, MD  predniSONE (DELTASONE) 10 MG tablet Take 5 mg by mouth daily with breakfast.    Yes [provider]  QUEtiapine (SEROQUEL) 400 MG tablet Take 1 tablet (400 mg total) by mouth at bedtime. 07/20/18  Yes Hurst, Dorothea Glassman, PA-C  ranitidine (ZANTAC 75) 75 MG tablet Take 75 mg by mouth 2 (two) times daily as needed for indigestion.   Yes [provider]  sodium bicarbonate 650 MG tablet Take 1,300 mg by mouth 2 (two) times daily.   Yes [provider]  vitamin B-12 (CYANOCOBALAMIN) 1000 MCG tablet Take 1,000 mcg  by mouth daily.   Yes [provider]  psyllium (METAMUCIL SMOOTH TEXTURE) 58.6 % powder Take 1 packet by mouth 3 (three) times daily. Patient not taking: Reported on 07/25/2018 02/13/18   Cordelia Poche  A, MD  spironolactone (ALDACTONE) 25 MG tablet Take 12.5 mg by mouth daily. 07/14/18   [provider]     Nickolas Madrid, NP 07/26/2018  3:50 PM Pager: [phone] or [phone]

## 2018-07-27 ENCOUNTER — Inpatient Hospital Stay (HOSPITAL_COMMUNITY): Payer: BLUE CROSS/BLUE SHIELD

## 2018-07-27 DIAGNOSIS — I340 Nonrheumatic mitral (valve) insufficiency: Secondary | ICD-10-CM

## 2018-07-27 DIAGNOSIS — G4733 Obstructive sleep apnea (adult) (pediatric): Secondary | ICD-10-CM

## 2018-07-27 DIAGNOSIS — J9601 Acute respiratory failure with hypoxia: Secondary | ICD-10-CM

## 2018-07-27 DIAGNOSIS — J810 Acute pulmonary edema: Secondary | ICD-10-CM

## 2018-07-27 LAB — CBC WITH DIFFERENTIAL/PLATELET
Abs Immature Granulocytes: 0 10*3/uL (ref 0.00–0.07)
BASOS PCT: 0 %
Basophils Absolute: 0 10*3/uL (ref 0.0–0.1)
EOS ABS: 0 10*3/uL (ref 0.0–0.5)
EOS PCT: 0 %
HCT: 22.9 % — ABNORMAL LOW (ref 36.0–46.0)
Hemoglobin: 7.9 g/dL — ABNORMAL LOW (ref 12.0–15.0)
Lymphocytes Relative: 18 %
Lymphs Abs: 0.6 10*3/uL — ABNORMAL LOW (ref 0.7–4.0)
MCH: 40.9 pg — ABNORMAL HIGH (ref 26.0–34.0)
MCHC: 34.5 g/dL (ref 30.0–36.0)
MCV: 118.7 fL — ABNORMAL HIGH (ref 80.0–100.0)
Monocytes Absolute: 0.1 10*3/uL (ref 0.1–1.0)
Monocytes Relative: 3 %
Neutro Abs: 2.5 10*3/uL (ref 1.7–7.7)
Neutrophils Relative %: 79 %
PLATELETS: 328 10*3/uL (ref 150–400)
RBC: 1.93 MIL/uL — ABNORMAL LOW (ref 3.87–5.11)
RDW: 21.2 % — AB (ref 11.5–15.5)
WBC: 3.2 10*3/uL — ABNORMAL LOW (ref 4.0–10.5)
nRBC: 0 % (ref 0.0–0.2)
nRBC: 0 /100 WBC

## 2018-07-27 LAB — BASIC METABOLIC PANEL
Anion gap: 10 (ref 5–15)
BUN: 24 mg/dL — ABNORMAL HIGH (ref 6–20)
CO2: 23 mmol/L (ref 22–32)
Calcium: 8.6 mg/dL — ABNORMAL LOW (ref 8.9–10.3)
Chloride: 106 mmol/L (ref 98–111)
Creatinine, Ser: 2.04 mg/dL — ABNORMAL HIGH (ref 0.44–1.00)
GFR calc Af Amer: 30 mL/min — ABNORMAL LOW (ref >60)
GFR, EST NON AFRICAN AMERICAN: 26 mL/min — AB (ref >60)
Glucose, Bld: 140 mg/dL — ABNORMAL HIGH (ref 70–99)
Potassium: 4.5 mmol/L (ref 3.5–5.1)
Sodium: 139 mmol/L (ref 135–145)

## 2018-07-27 LAB — ECHOCARDIOGRAM COMPLETE
Height: 67 in
Weight: 2821.89 oz

## 2018-07-27 LAB — MRSA PCR SCREENING: MRSA BY PCR: NEGATIVE

## 2018-07-27 MED ORDER — IPRATROPIUM-ALBUTEROL 0.5-2.5 (3) MG/3ML IN SOLN
3.0000 mL | Freq: Three times a day (TID) | RESPIRATORY_TRACT | Status: DC
  Administered 2018-07-27 (×2): 3 mL via RESPIRATORY_TRACT
  Filled 2018-07-27 (×2): qty 3

## 2018-07-27 MED ORDER — METHYLPREDNISOLONE SODIUM SUCC 40 MG IJ SOLR
40.0000 mg | Freq: Two times a day (BID) | INTRAMUSCULAR | Status: DC
  Administered 2018-07-27 – 2018-07-28 (×2): 40 mg via INTRAVENOUS
  Filled 2018-07-27 (×2): qty 1

## 2018-07-27 NOTE — Progress Notes (Signed)
  Echocardiogram 2D Echocardiogram has been performed.  Cherrill Scrima G Shaneeka Scarboro 07/27/2018, 1:54 PM

## 2018-07-27 NOTE — Progress Notes (Signed)
Occupational Therapy Treatment Patient Details Name: Anna [name] MRN: [id number] DOB: [birth year] Today's Date: 07/27/2018    History of present illness Patient is a 58 y/o female who presents from kidney MD with AMS. Admitted with acute encephalopathy secondary to possible BZD?  AKI, acidosis? PMH includes bipolar disorder, CHF, CVA, lupus, CKD, HTN.   OT comments  Pt noted to have oxygen saturation 90% on 8L venturi- mask after bathroom transfer. Pt educated on energy conservation and spirometer. Pt with return demonstration during session. Pt with coughing throughout session. Uncertain of home (A) upon d/c at this time.   Follow Up Recommendations  SNF;Supervision/Assistance - 24 hour    Equipment Recommendations  Other (comment);3 in 1 bedside commode    Recommendations for Other Services      Precautions / Restrictions Precautions Precautions: Fall Precaution Comments: watch 02       Mobility Bed Mobility Overal bed mobility: Modified Independent                Transfers Overall transfer level: Needs assistance   Transfers: Sit to/from Stand Sit to Stand: Min guard         General transfer comment: declines RW. RN in the room states "she didnt use it at all yesterday so patient declines use" pt holding environmental supports for safety throughout session    Balance Overall balance assessment: History of Falls;Needs assistance         Standing balance support: During functional activity;Single extremity supported Standing balance-Leahy Scale: Fair                             ADL either performed or assessed with clinical judgement   ADL Overall ADL's : Needs assistance/impaired     Grooming: Min guard;Standing Grooming Details (indicate cue type and reason): pt requires 1 UE support on sink  for balance                 Toilet Transfer: Min Psychiatric nurse Details (indicate cue type and reason): pt reaching for  environmental supports. pt on 8L O2 during session with coughing Toileting- Clothing Manipulation and Hygiene: Min guard;Sit to/from stand Toileting - Clothing Manipulation Details (indicate cue type and reason): requires L UE on grab bar     Functional mobility during ADLs: Min guard General ADL Comments: pt educated on energy conservation regarding water temperature, sustain standing > 5 minutes during a task should be done sitting, educated on spirometer with return demo and positioned in chair for upright posture. pt declines handout offered.      Vision       Perception     Praxis      Cognition Arousal/Alertness: Awake/alert Behavior During Therapy: WFL for tasks assessed/performed Overall Cognitive Status: Impaired/Different from baseline Area of Impairment: Memory;Awareness                     Memory: Decreased short-term memory   Safety/Judgement: Decreased awareness of safety;Decreased awareness of deficits Awareness: Emergent   General Comments: pt states "where you the one that told me" "were you the one that i saw yesterday" pt educated sevearl times during session that OT is first time visiting patient. pt expressed never having energy conservation education then when offered handout states "no no i have that. I dont need that"        Exercises     Shoulder Instructions       General  Comments pt with mask on 8 L at this time. pt with 02 saturations 90% on 8L after bathroom transfer    Pertinent Vitals/ Pain       Pain Assessment: 0-10 Pain Score: 5  Pain Location: head Pain Descriptors / Indicators: Headache Pain Intervention(s): Repositioned;Other (comment)(RN present and requested)  Home Living                                          Prior Functioning/Environment              Frequency  Min 2X/week        Progress Toward Goals  OT Goals(current goals can now be found in the care plan section)  Progress  towards OT goals: Progressing toward goals  Acute Rehab OT Goals Patient Stated Goal: to go to [employer] OT Goal Formulation: With patient Time For Goal Achievement: 08/08/18 Potential to Achieve Goals: Good ADL Goals Pt Will Perform Grooming: with supervision;standing Pt Will Transfer to Toilet: with supervision;ambulating Pt Will Perform Toileting - Clothing Manipulation and hygiene: with modified independence;sitting/lateral leans Pt Will Perform Tub/Shower Transfer: Tub transfer;with supervision;with caregiver independent in assisting;ambulating;3 in 1 Additional ADL Goal #1: Pt will recall 3 ways of conserving energy during ADL with one or less verbal cues  Plan Discharge plan remains appropriate    Co-evaluation                 AM-PAC OT "6 Clicks" Daily Activity     Outcome Measure   Help from another person eating meals?: None Help from another person taking care of personal grooming?: A Little Help from another person toileting, which includes using toliet, bedpan, or urinal?: A Little Help from another person bathing (including washing, rinsing, drying)?: A Little Help from another person to put on and taking off regular upper body clothing?: None Help from another person to put on and taking off regular lower body clothing?: A Little 6 Click Score: 20    End of Session Equipment Utilized During Treatment: Gait belt  OT Visit Diagnosis: Unsteadiness on feet (R26.81);Other abnormalities of gait and mobility (R26.89);Repeated falls (R29.6);History of falling (Z91.81);Muscle weakness (generalized) (M62.81);Other symptoms and signs involving cognitive function   Activity Tolerance Patient tolerated treatment well   Patient Left in chair;with call bell/phone within reach;with chair alarm set   Nurse Communication Mobility status;Precautions        Time: 0981-1914 OT Time Calculation (min): 23 min  Charges: OT General Charges $OT Visit: 1 Visit OT  Treatments $Self Care/Home Management : 23-37 mins   Jeri Modena, OTR/L  Acute Rehabilitation Services Pager: [phone] Office: [phone] .    Jeri Modena 07/27/2018, 11:31 AM

## 2018-07-27 NOTE — Progress Notes (Signed)
PROGRESS NOTE    Anna [name]  XLK:[id number] DOB: September 07, 1960 DOA: 07/23/2018 PCP: [name], MD   Brief Narrative: Patient is a 58 year old female with history of CVA, SLE, hyperlipidemia, hypothyroidism, hypertension, bipolar disorder, CHF who presented to the emergency department with altered mental status.  She has multiple admissions in the past with a similar scenerio.  Her altered mental status was attributed to benzodiazepines.  Patient was also found to be in acute kidney injury on presentation.  Her hospital course was remarkable for development of acute respiratory failure with hypoxia and had to be put on nonrebreather. Respiratory status has improved this morning. Assessment & Plan:   Principal Problem:   AMS (altered mental status) Active Problems:   SLE (systemic lupus erythematosus) (HCC)   Hypothyroid   Essential hypertension, benign   Acute renal failure superimposed on stage 3 chronic kidney disease (HCC)   RTA (renal tubular acidosis)   Chronic diastolic CHF (congestive heart failure) (HCC)   Bipolar disease, chronic (HCC)  Acute hypoxic respiratory failure: On Ventimask. Respiratory status has slightly improved this morning with decreased oxygen requirement.  Continue steroids. Chest x-ray done on 1/17 had some COPD changes versus atypical pneumonia.  Also started on antibiotics.  Follow-up cultures. Follow-up echocardiogram. Chest Xray today showed Cardiomegaly with diffuse bilateral pulmonary interstitial prominence and small bilateral pleural effusions.  Acute COPD exacerbation: Smokes a pack a day since last several years.   Continue bronchodilators and steroids.    Altered mental status: Has been admitted in the past with similar complaints more than once.  Her UDS was positive for benzodiazepine.  Mental status has improved this morning.  She is more alert. she is currently alert and oriented.  We will continue to monitor mental status.  Acute  kidney injury on CKD stage III: Her baseline creatinine is around 1.5.  Presented with creatinine of more than 2.  She does not look volume overloaded.  Her CKD might have advanced due to her SLE. Renal ultrasound showed diffusely increased echogenicity within the renal parenchyma, compatible with medical renal disease She needs to  follow-up with nephrology on discharge.  SLE: Continue Imuran, Plaquenil and prednisone.  Also on folic acid.  Microcytic anemia: MCV of 112.  Vitamin B12 level normal.  Already on folic acid supplementation and will continue those.  Her hemoglobin this morning is 7.9.  Iron studies shows normal iron level, ferritin and iron saturation.  No evidence of active bleeding.  History of renal tubular acidosis: On bicarb tablets at home.  Will resume those.  Hypertension: Continue amlodipine, Toprol.  Currently blood pressure stable.  History of chronic diastolic CHF: Currently compensated.   History of bipolar disorder: On Lexapro and Seroquel which we will continue.  On Valium at home which could have made her altered.  Hold Valium.  We will also discontinue Seroquel because of her drowsiness  Hypothyroidism: Continue Synthyroid  Deconditioning/debility: Physical therapy and Occupational Therapy saw the patient and recommended skilled nursing facility on discharge.  Social worker consulted.        DVT prophylaxis: SCD Code Status: Full Family Communication: None present at the bedside Disposition Plan: Skilled nursing facility after she is medically stabilized  Consultants: PCCM  Procedures:None  Antimicrobials:  Anti-infectives (From admission, onward)   Start     Dose/Rate Route Frequency Ordered Stop   07/26/18 1800  vancomycin (VANCOCIN) IVPB 1000 mg/200 mL premix     1,000 mg 200 mL/hr over 60 Minutes Intravenous Every 24 hours  07/26/18 1635     07/26/18 1700  piperacillin-tazobactam (ZOSYN) IVPB 3.375 g     3.375 g 12.5 mL/hr over 240 Minutes  Intravenous Every 8 hours 07/26/18 1635     07/26/18 1030  doxycycline (VIBRA-TABS) tablet 100 mg  Status:  Discontinued     100 mg Oral Every 12 hours 07/26/18 1027 07/26/18 1543   07/24/18 1000  hydroxychloroquine (PLAQUENIL) tablet 200 mg     200 mg Oral Daily 07/23/18 1601        Subjective: Patient seen and examined the bedside this morning.  Looks more comfortable than yesterday.  More alert.  Decreased oxygen requirement.  Still desaturates easily on nasal cannula.  Objective: Vitals:   07/27/18 0423 07/27/18 0711 07/27/18 0733 07/27/18 1144  BP: 138/82  (!) 148/89 (!) 149/88  Pulse: 77 77 89 72  Resp: 18 18 18 18   Temp: 98.4 F (36.9 C)  97.8 F (36.6 C) 98 F (36.7 C)  TempSrc: Oral  Oral   SpO2: 99% 97% 91% 99%  Weight:      Height:        Intake/Output Summary (Last 24 hours) at 07/27/2018 1145 Last data filed at 07/27/2018 0600 Gross per 24 hour  Intake 2249.46 ml  Output -  Net 2249.46 ml   Filed Weights   07/24/18 0611  Weight: 80 kg    Examination:   General exam: No obvious distress  HEENT:PERRL,Oral mucosa moist, Ear/Nose normal on gross exam Respiratory system: Bilateral coarse breathing sounds Cardiovascular system: S1 & S2 heard, RRR. No JVD, murmurs, rubs, gallops or clicks. Gastrointestinal system: Abdomen is nondistended, soft and nontender. No organomegaly or masses felt. Normal bowel sounds heard. Central nervous system: Alert and oriented. No focal neurological deficits. Extremities: No edema, no clubbing ,no cyanosis, distal peripheral pulses palpable. Skin: No rashes, lesions or ulcers,no icterus ,no pallor    Data Reviewed: I have personally reviewed following labs and imaging studies  CBC: Recent Labs  Lab 07/23/18 0954 07/23/18 1032 07/24/18 1005 07/25/18 0637 07/27/18 0544  WBC 4.9  --  4.5 5.1 3.2*  NEUTROABS 3.7  --   --  3.3 2.5  HGB 9.0* 8.8* 8.5* 7.9* 7.9*  HCT 26.3* 26.0* 22.2* 21.2* 22.9*  MCV 123.5*  --  118.7*  117.8* 118.7*  PLT 423*  --  378 349 517   Basic Metabolic Panel: Recent Labs  Lab 07/23/18 0954 07/23/18 1032 07/24/18 1005 07/25/18 0637 07/27/18 0544  NA 144 144 142 142 139  K 5.1 5.1 4.3 4.1 4.5  CL 118* 124* 115* 111 106  CO2 14*  --  17* 19* 23  GLUCOSE 109* 103* 113* 106* 140*  BUN 34* 33* 26* 24* 24*  CREATININE 2.57* 2.80* 2.14* 1.88* 2.04*  CALCIUM 9.0  --  9.0 8.8* 8.6*   GFR: Estimated Creatinine Clearance: 32.3 mL/min (A) (by C-G formula based on SCr of 2.04 mg/dL (H)). Liver Function Tests: Recent Labs  Lab 07/23/18 0954  AST 30  ALT 17  ALKPHOS 63  BILITOT 0.3  PROT 7.4  ALBUMIN 3.9   No results for input(s): LIPASE, AMYLASE in the last 168 hours. Recent Labs  Lab 07/23/18 1545  AMMONIA 20   Coagulation Profile: No results for input(s): INR, PROTIME in the last 168 hours. Cardiac Enzymes: No results for input(s): CKTOTAL, CKMB, CKMBINDEX, TROPONINI in the last 168 hours. BNP (last 3 results) No results for input(s): PROBNP in the last 8760 hours. HbA1C: No results for input(s):  HGBA1C in the last 72 hours. CBG: Recent Labs  Lab 07/23/18 0945  GLUCAP 114*   Lipid Profile: No results for input(s): CHOL, HDL, LDLCALC, TRIG, CHOLHDL, LDLDIRECT in the last 72 hours. Thyroid Function Tests: No results for input(s): TSH, T4TOTAL, FREET4, T3FREE, THYROIDAB in the last 72 hours. Anemia Panel: No results for input(s): VITAMINB12, FOLATE, FERRITIN, TIBC, IRON, RETICCTPCT in the last 72 hours. Sepsis Labs: Recent Labs  Lab 07/23/18 1033  LATICACIDVEN 0.76    Recent Results (from the past 240 hour(s))  Urine culture     Status: Abnormal   Collection Time: 07/23/18  1:42 PM  Result Value Ref Range Status   Specimen Description URINE, CLEAN CATCH  Final   Special Requests   Final    NONE Performed at [facility], [place] [address], [place], [place] [address]    Culture MULTIPLE SPECIES PRESENT, SUGGEST RECOLLECTION (A)  Final   Report  Status 07/24/2018 FINAL  Final  Culture, blood (routine x 2)     Status: None (Preliminary result)   Collection Time: 07/23/18  7:12 PM  Result Value Ref Range Status   Specimen Description BLOOD LEFT HAND  Final   Special Requests   Final    BOTTLES DRAWN AEROBIC ONLY Blood Culture adequate volume   Culture   Final    NO GROWTH 3 DAYS Performed at [facility], [place] [address], [place], [place] [address]    Report Status PENDING  Incomplete  Culture, blood (routine x 2)     Status: None (Preliminary result)   Collection Time: 07/23/18  7:12 PM  Result Value Ref Range Status   Specimen Description BLOOD LEFT HAND  Final   Special Requests   Final    BOTTLES DRAWN AEROBIC ONLY Blood Culture adequate volume   Culture   Final    NO GROWTH 3 DAYS Performed at [facility], [address] [place], [place] [address]    Report Status PENDING  Incomplete         Radiology Studies: Dg Chest 2 View  Result Date: 07/27/2018 CLINICAL DATA:  Respiratory failure. EXAM: CHEST - 2 VIEW COMPARISON:  CT 07/26/2017. FINDINGS: Mediastinum and hilar structures normal. Cardiomegaly with diffuse bilateral pulmonary interstitial prominence and small bilateral pleural effusions. No pneumothorax. No acute bony abnormality. IMPRESSION: Cardiomegaly with diffuse bilateral pulmonary interstitial prominence and small bilateral pleural effusions again noted. Similar findings on prior exams. Again CHF and/or pneumonitis could present in this fashion. Electronically Signed   By: Marcello Moores  Register   On: 07/27/2018 07:30   Ct Chest Wo Contrast  Result Date: 07/26/2018 CLINICAL DATA:  58 year old female with history of COPD exacerbation. Bilateral upper chest soreness from coughing. EXAM: CT CHEST WITHOUT CONTRAST TECHNIQUE: Multidetector CT imaging of the chest was performed following the standard protocol without IV contrast. COMPARISON:  Chest CT 06/15/2018. FINDINGS: Cardiovascular: Heart size is  borderline enlarged. Small volume of pericardial fluid and/or thickening, unlikely to be of any hemodynamic significance at this time. No associated pericardial calcification. Aortic atherosclerosis. No definite coronary artery calcifications. Mediastinum/Nodes: No definite pathologically enlarged mediastinal lymph nodes noted on today's noncontrast CT examination, although there is extensive fullness in the right hilar region which could indicate lymphadenopathy (poorly evaluated without IV contrast). Esophagus is unremarkable in appearance. No axillary lymphadenopathy. Lungs/Pleura: Trace right pleural effusion, most of which tracks in the inferior aspect of the right major fissure laterally. Extensive thickening of the peribronchovascular interstitium with surrounding ground-glass  attenuation and associated interlobular septal thickening throughout the right lung, most evident in the right lower and middle lobes. Fullness in the right infrahilar region inseparable from adjacent hilar structures. Left lung is clear. Diffuse bronchial wall thickening with moderate centrilobular and mild paraseptal emphysema. Focal architectural distortion in the anterior aspect of the right upper lobe near the apex, most compatible with an area of post infectious or inflammatory scarring. Upper Abdomen: Aortic atherosclerosis. Musculoskeletal: There are no aggressive appearing lytic or blastic lesions noted in the visualized portions of the skeleton. IMPRESSION: 1. The appearance of the chest is concerning for potential right hilar mass with associated right hilar lymphadenopathy. At this time, there is what appears to be asymmetric edema involving the right lower and middle lobes with small pleural effusion tracking in the right major fissure. Further evaluation with contrast enhanced chest CT is recommended to better evaluate these findings if there is clinical concern for malignancy. 2. Aortic atherosclerosis. 3. Diffuse  bronchial wall thickening with moderate centrilobular and mild paraseptal emphysema; imaging findings suggestive of underlying COPD. Aortic Atherosclerosis (ICD10-I70.0) and Emphysema (ICD10-J43.9). Electronically Signed   By: Vinnie Langton M.D.   On: 07/26/2018 12:49   US Renal  Result Date: 07/27/2018 CLINICAL DATA:  Initial evaluation for acute renal injury EXAM: RENAL / URINARY TRACT ULTRASOUND COMPLETE COMPARISON:  Prior CT from 02/12/2018 FINDINGS: Right Kidney: Renal measurements: 10.3 x 6.1 x 6.7 cm = volume: 214.8 mL. Diffusely increased echogenicity, compatible with medical renal disease. No hydronephrosis. 7 x 6 x 10 mm simple cyst present at the interpolar region. Few small cyst present at the lower pole the right kidney, largest measuring 1.2 x 1.2 x 1.3 cm. Left Kidney: Renal measurements: 8.7 x 4.3 x 4.2 cm = volume: 82.6 mL. Diffusely increased echogenicity, compatible with medical renal disease. No hydronephrosis. No focal renal mass. Bladder: Appears normal for degree of bladder distention. Bilateral ureteral jets visualized. IMPRESSION: 1. Diffusely increased echogenicity within the renal parenchyma, compatible with medical renal disease. 2. No hydronephrosis. 3. Simple right renal cysts as above. Electronically Signed   By: Jeannine Boga M.D.   On: 07/27/2018 00:26        Scheduled Meds: . amLODipine  10 mg Oral Daily  . aspirin EC  81 mg Oral Daily  . atorvastatin  20 mg Oral Daily  . azaTHIOprine  50 mg Oral BID  . calcitRIOL  0.25 mcg Oral Daily  . docusate sodium  100 mg Oral BID  . escitalopram  30 mg Oral Daily  . folic acid  1 mg Oral Daily  . guaiFENesin  600 mg Oral BID  . hydroxychloroquine  200 mg Oral Daily  . ipratropium-albuterol  3 mL Nebulization TID  . levothyroxine  50 mcg Oral Q0600  . methylPREDNISolone (SOLU-MEDROL) injection  40 mg Intravenous Q12H  . metoprolol succinate  50 mg Oral Daily  . pantoprazole  40 mg Oral Daily  . polyethylene  glycol  17 g Oral BID  . sodium bicarbonate  1,300 mg Oral BID  . vitamin B-12  1,000 mcg Oral Daily   Continuous Infusions: . sodium chloride Stopped (07/26/18 2155)  . lactated ringers 50 mL/hr at 07/27/18 0600  . piperacillin-tazobactam (ZOSYN)  IV 3.375 g (07/27/18 0540)  . vancomycin Stopped (07/26/18 2141)     LOS: 3 days    Time spent: 35 mins.More than 50% of that time was spent in counseling and/or coordination of care.      Shelly Coss, MD  [facility] Pager [phone]  If 7PM-7AM, please contact night-coverage [email or web address] Password [email or web address] 07/27/2018, 11:45 AM

## 2018-07-27 NOTE — Progress Notes (Signed)
Physical Therapy Treatment Patient Details Name: Anna [name] MRN: [id number] DOB: Jul 22, 1960 Today's Date: 07/27/2018    History of Present Illness Patient is a 58 y/o female who presents from kidney MD with AMS. Admitted with acute encephalopathy secondary to possible BZD?  AKI, acidosis? PMH includes bipolar disorder, CHF, CVA, lupus, CKD, HTN.    PT Comments    Patient progressing well towards PT goals. Tolerated gait training with close min guard for balance/safety due to bil knee instability and 02. Sp02 dropped to 83% on 6L/min 02. Required seated rest break and cues for pursed lip breathing. Continues to some cognitive deficits especially relating to memory and awareness. More alert today. Will follow.    Follow Up Recommendations  SNF;Supervision for mobility/OOB     Equipment Recommendations  None recommended by PT    Recommendations for Other Services       Precautions / Restrictions Precautions Precautions: Fall Precaution Comments: watch 02 Restrictions Weight Bearing Restrictions: No    Mobility  Bed Mobility Overal bed mobility: Modified Independent                Transfers Overall transfer level: Needs assistance Equipment used: Rolling walker (2 wheeled) Transfers: Sit to/from Stand Sit to Stand: Min guard         General transfer comment: Min guard for safety. Stood from [employer], from Youth worker.   Ambulation/Gait Ambulation/Gait assistance: Min guard;Min assist Gait Distance (Feet): 175 Feet(x2 bouts) Assistive device: Rolling walker (2 wheeled) Gait Pattern/deviations: Step-through pattern;Decreased stride length;Trunk flexed Gait velocity: decreased   General Gait Details: Slow, unsteady gait with bil knee instability worsened with distance; 2/4 DOE. Sp02 dropped to 84% on 6L/min 02. Cues for pursed lip breathing. No dizziness.    Stairs             Wheelchair Mobility    Modified Rankin (Stroke Patients Only)        Balance Overall balance assessment: History of Falls;Needs assistance Sitting-balance support: Feet supported;No upper extremity supported Sitting balance-Leahy Scale: Good     Standing balance support: During functional activity Standing balance-Leahy Scale: Fair                              Cognition Arousal/Alertness: Awake/alert Behavior During Therapy: WFL for tasks assessed/performed Overall Cognitive Status: Impaired/Different from baseline Area of Impairment: Memory;Awareness                     Memory: Decreased short-term memory   Safety/Judgement: Decreased awareness of safety;Decreased awareness of deficits Awareness: Emergent   General Comments: pt states "where you the one that told me" "were you the one that i saw yesterday" pt educated sevearl times during session that OT is first time visiting patient. pt expressed never having energy conservation education then when offered handout states "no no i have that. I dont need that"      Exercises      General Comments General comments (skin integrity, edema, etc.): pt with mask on 8 L at this time. pt with 02 saturations 90% on 8L after bathroom transfer      Pertinent Vitals/Pain Pain Assessment: Faces Pain Score: 5  Faces Pain Scale: Hurts even more Pain Location: chronic back pain Pain Descriptors / Indicators: Sore;Aching Pain Intervention(s): Monitored during session;Patient requesting pain meds-RN notified;Limited activity within patient's tolerance    Home Living  Prior Function            PT Goals (current goals can now be found in the care plan section) Acute Rehab PT Goals Patient Stated Goal: to go to [employer] Progress towards PT goals: Progressing toward goals    Frequency    Min 3X/week      PT Plan Current plan remains appropriate    Co-evaluation              AM-PAC PT "6 Clicks" Mobility   Outcome Measure  Help needed  turning from your back to your side while in a flat bed without using bedrails?: A Little Help needed moving from lying on your back to sitting on the side of a flat bed without using bedrails?: A Little Help needed moving to and from a bed to a chair (including a wheelchair)?: A Little Help needed standing up from a chair using your arms (e.g., wheelchair or bedside chair)?: A Little Help needed to walk in hospital room?: A Little Help needed climbing 3-5 steps with a railing? : A Lot 6 Click Score: 17    End of Session Equipment Utilized During Treatment: Gait belt;Oxygen Activity Tolerance: Treatment limited secondary to medical complications (Comment)(drop in Sp02) Patient left: in bed;with bed alarm set;with nursing/sitter in room;with call bell/phone within reach Nurse Communication: Mobility status;Other (comment)(02 sat) PT Visit Diagnosis: Muscle weakness (generalized) (M62.81);Difficulty in walking, not elsewhere classified (R26.2);Unsteadiness on feet (R26.81)     Time: 2336-1224 PT Time Calculation (min) (ACUTE ONLY): 32 min  Charges:  $Gait Training: 23-37 mins                     Wray Kearns, [place], DPT Acute Rehabilitation Services Pager [phone] Office [place] 07/27/2018, 2:54 PM

## 2018-07-27 NOTE — Progress Notes (Addendum)
NAME:  Anna [name], MRN:  [id number], DOB:  02-27-1961, LOS: 3 ADMISSION DATE:  07/23/2018, CONSULTATION DATE:  1/20 REFERRING MD:  [facility] , CHIEF COMPLAINT:  Abnormal chest CT    Brief History   58yo female smoker with hx SLE on imuran, COPD, HTN, bipolar, CHF admitted 1/17 with AMS thought r/t benzos with multiple recent admissions for the same. Also found to have AKI. Overnight 1/19 had worsening hypoxia and CT chest performed to further eval which revealed ?R hilar mass (not seen on CT in December 2019) and PCCM consulted.    Past Medical History   has a past medical history of Acute CHF ([place]) (07/2016), Depressive disorder, not elsewhere classified, Family history of adverse reaction to anesthesia, Hypertension, Hypothyroid, Kidney stone, Lupus (systemic lupus erythematosus) ([place]), Migraine, Pure hypercholesterolemia, and Stroke ([place]).   Significant Hospital Events   1/17  Admit  1/20  PCCM consulted for evaluation of AMS & abnormal CT chest  Consults:  PCCM  Procedures:    Significant Diagnostic Tests:  CT chest 1/20 >> 1. The appearance of the chest is concerning for potential right hilar mass with associated right hilar lymphadenopathy. At this time, there is what appears to be asymmetric edema involving the right lower and middle lobes with small pleural effusion tracking in the right major fissure. Further evaluation with contrast enhanced chest CT is recommended to better evaluate these findings if there is clinical concern for malignancy. 2. Aortic atherosclerosis. 3. Diffuse bronchial wall thickening with moderate centrilobular and mild paraseptal emphysema; imaging findings suggestive of underlying COPD. CT head 1/17 >> neg acute  ECHO 1/21 >>   Micro Data:  BC x 2 1/17 >>  Antimicrobials:  Doxycycline 1/20>>>   Interim history/subjective:  Pt reports she feels the lasix has helped.  No output charted on I/O's but pt reports she went to the bathroom "a lot"  overnight.   Objective   Blood pressure (!) 148/89, pulse 89, temperature 97.8 F (36.6 C), temperature source Oral, resp. rate 18, height 5\' 7"  (1.702 m), weight 80 kg, last menstrual period 07/07/2002, SpO2 91 %.    FiO2 (%):  [45 %] 45 %   Intake/Output Summary (Last 24 hours) at 07/27/2018 2505 Last data filed at 07/27/2018 0600 Gross per 24 hour  Intake 2249.46 ml  Output -  Net 2249.46 ml   Filed Weights   07/24/18 0611  Weight: 80 kg    Examination: General: adult female lying in bed in NAD HEENT: MM pink/moist, VM in place  Neuro: AAOx4, speech clear, MAE CV: s1s2 rrr, no m/r/g PULM: even/non-labored, lungs bilaterally with bibasilar crackles  LZ:JQBH, non-tender, bsx4 active  Extremities: warm/dry, trace LE edema  Skin: no rashes or lesions  Resolved Hospital Problem list     Assessment & Plan:   Acute Hypoxic Respiratory Failure  -possible right sided edema  -TSH wnl P: Wean O2 for sats >90% Pulmonary hygiene - IS, mobilize  Hold further lasix  Assess ambulatory O2 needs prior to discharge  Limit sedating medications as able Await ECHO  Wean steroids to off   Perihilar Airspace Disease with Concern for Right Hilar Mass with LAN  -mass NOT seen on CT in 06/2018, would be unusual for mass growth of that size in one month P: No role FOB at this time  Consider follow up imaging in 4-6 weeks  Avoid IV contrast given marginal renal function  ABX per primary, D2/x  Follow up with pulmonary as outpatient  Emphysema / COPD  -extensive emphysematous changes, peribronchovascular thickening  P: Duoneb Q6  Smoking cessation counseling   Suspected OSA  -noted to be snoring upon entering room  P: Follow up with pulmonary as outpatient for sleep evaluation   Lupus  P: Imuran + Plaquenil per primary    CKD  P: Trend BMP / urinary output Replace electrolytes as indicated Avoid nephrotoxic agents, ensure adequate renal perfusion   Labs     CBC: Recent Labs  Lab 07/23/18 0954 07/23/18 1032 07/24/18 1005 07/25/18 0637 07/27/18 0544  WBC 4.9  --  4.5 5.1 3.2*  NEUTROABS 3.7  --   --  3.3 2.5  HGB 9.0* 8.8* 8.5* 7.9* 7.9*  HCT 26.3* 26.0* 22.2* 21.2* 22.9*  MCV 123.5*  --  118.7* 117.8* 118.7*  PLT 423*  --  378 349 350    Basic Metabolic Panel: Recent Labs  Lab 07/23/18 0954 07/23/18 1032 07/24/18 1005 07/25/18 0637 07/27/18 0544  NA 144 144 142 142 139  K 5.1 5.1 4.3 4.1 4.5  CL 118* 124* 115* 111 106  CO2 14*  --  17* 19* 23  GLUCOSE 109* 103* 113* 106* 140*  BUN 34* 33* 26* 24* 24*  CREATININE 2.57* 2.80* 2.14* 1.88* 2.04*  CALCIUM 9.0  --  9.0 8.8* 8.6*   GFR: Estimated Creatinine Clearance: 32.3 mL/min (A) (by C-G formula based on SCr of 2.04 mg/dL (H)). Recent Labs  Lab 07/23/18 0954 07/23/18 1033 07/24/18 1005 07/25/18 0637 07/27/18 0544  WBC 4.9  --  4.5 5.1 3.2*  LATICACIDVEN  --  0.76  --   --   --     Liver Function Tests: Recent Labs  Lab 07/23/18 0954  AST 30  ALT 17  ALKPHOS 63  BILITOT 0.3  PROT 7.4  ALBUMIN 3.9   No results for input(s): LIPASE, AMYLASE in the last 168 hours. Recent Labs  Lab 07/23/18 1545  AMMONIA 20    ABG    Component Value Date/Time   PHART 7.354 07/26/2018 1323   PCO2ART 45.2 07/26/2018 1323   PO2ART 53.2 (L) 07/26/2018 1323   HCO3 24.5 07/26/2018 1323   TCO2 16 (L) 07/23/2018 1039   ACIDBASEDEF 0.3 07/26/2018 1323   O2SAT 83.7 07/26/2018 1323     Coagulation Profile: No results for input(s): INR, PROTIME in the last 168 hours.  Cardiac Enzymes: No results for input(s): CKTOTAL, CKMB, CKMBINDEX, TROPONINI in the last 168 hours.  HbA1C: No results found for: HGBA1C  CBG: Recent Labs  Lab 07/23/18 0945  GLUCAP Karnak, NP-C [facility] Pgr: [phone] or if no answer [phone] 07/27/2018, 9:06 AM  Attending Note:  58 year old female with history of pulmonary edema from CHF and  benzo use.  Patient is improving.  On exam, lungs with bibasilar crackles.  I reviewed CXR myself, pulmonary edema noted.  Discussed with PCCM-NP.  Patient is much improved.  Acute respiratory failure:  - Minimize benzos  Acute pulmonary edema  - Lasix as ordered  - CXR as needed  COPD:  - Duonebs  - Smoking cessation  OSA:  - Will need outpatient f/u for a sleep study upon discharge  PCCM will sign off, please call back if needed.  Patient seen and examined, agree with above note.  I dictated the care and orders written for this patient under my direction.  Rush Farmer, [place]

## 2018-07-28 LAB — URINALYSIS, ROUTINE W REFLEX MICROSCOPIC
Bilirubin Urine: NEGATIVE
Glucose, UA: NEGATIVE mg/dL
Hgb urine dipstick: NEGATIVE
Ketones, ur: NEGATIVE mg/dL
Leukocytes, UA: NEGATIVE
Nitrite: NEGATIVE
PH: 6 (ref 5.0–8.0)
Protein, ur: NEGATIVE mg/dL
SPECIFIC GRAVITY, URINE: 1.008 (ref 1.005–1.030)

## 2018-07-28 LAB — BASIC METABOLIC PANEL
Anion gap: 11 (ref 5–15)
BUN: 29 mg/dL — ABNORMAL HIGH (ref 6–20)
CO2: 24 mmol/L (ref 22–32)
Calcium: 8.9 mg/dL (ref 8.9–10.3)
Chloride: 105 mmol/L (ref 98–111)
Creatinine, Ser: 2.08 mg/dL — ABNORMAL HIGH (ref 0.44–1.00)
GFR calc Af Amer: 29 mL/min — ABNORMAL LOW (ref >60)
GFR calc non Af Amer: 25 mL/min — ABNORMAL LOW (ref >60)
Glucose, Bld: 125 mg/dL — ABNORMAL HIGH (ref 70–99)
Potassium: 4.1 mmol/L (ref 3.5–5.1)
SODIUM: 140 mmol/L (ref 135–145)

## 2018-07-28 LAB — CBC WITH DIFFERENTIAL/PLATELET
Abs Immature Granulocytes: 0.07 10*3/uL (ref 0.00–0.07)
Basophils Absolute: 0 10*3/uL (ref 0.0–0.1)
Basophils Relative: 0 %
Eosinophils Absolute: 0 10*3/uL (ref 0.0–0.5)
Eosinophils Relative: 0 %
HCT: 25.8 % — ABNORMAL LOW (ref 36.0–46.0)
Hemoglobin: 8.7 g/dL — ABNORMAL LOW (ref 12.0–15.0)
Immature Granulocytes: 1 %
Lymphocytes Relative: 12 %
Lymphs Abs: 1.1 10*3/uL (ref 0.7–4.0)
MCH: 39.9 pg — ABNORMAL HIGH (ref 26.0–34.0)
MCHC: 33.7 g/dL (ref 30.0–36.0)
MCV: 118.3 fL — AB (ref 80.0–100.0)
MONOS PCT: 9 %
Monocytes Absolute: 0.8 10*3/uL (ref 0.1–1.0)
Neutro Abs: 6.6 10*3/uL (ref 1.7–7.7)
Neutrophils Relative %: 78 %
PLATELETS: 411 10*3/uL — AB (ref 150–400)
RBC: 2.18 MIL/uL — ABNORMAL LOW (ref 3.87–5.11)
RDW: 20.8 % — ABNORMAL HIGH (ref 11.5–15.5)
WBC: 8.6 10*3/uL (ref 4.0–10.5)
nRBC: 0.2 % (ref 0.0–0.2)

## 2018-07-28 LAB — CULTURE, BLOOD (ROUTINE X 2)
Culture: NO GROWTH
Culture: NO GROWTH
Special Requests: ADEQUATE
Special Requests: ADEQUATE

## 2018-07-28 MED ORDER — PREDNISONE 20 MG PO TABS
40.0000 mg | ORAL_TABLET | Freq: Every day | ORAL | Status: DC
  Administered 2018-07-29 – 2018-08-01 (×4): 40 mg via ORAL
  Filled 2018-07-28 (×4): qty 2

## 2018-07-28 MED ORDER — FUROSEMIDE 10 MG/ML IJ SOLN
40.0000 mg | Freq: Once | INTRAMUSCULAR | Status: AC
  Administered 2018-07-28: 40 mg via INTRAVENOUS
  Filled 2018-07-28: qty 4

## 2018-07-28 MED ORDER — IPRATROPIUM-ALBUTEROL 0.5-2.5 (3) MG/3ML IN SOLN: 3.0000 mL | Freq: Four times a day (QID) | RESPIRATORY_TRACT | Status: DC | PRN

## 2018-07-28 MED ORDER — FUROSEMIDE 10 MG/ML IJ SOLN
INTRAMUSCULAR | Status: AC
  Filled 2018-07-28: qty 4

## 2018-07-28 NOTE — Progress Notes (Addendum)
PROGRESS NOTE    Anna [name]  DVV:[id number] DOB: [birth year] DOA: 07/23/2018 PCP: [name], MD   Brief Narrative: Patient is a 58 year old female with history of CVA, SLE, hyperlipidemia, hypothyroidism, hypertension, bipolar disorder, CHF who presented to the emergency department with altered mental status.  She has multiple admissions in the past with a similar scenerio.  Her altered mental status was attributed to benzodiazepines.  Patient was also found to be in acute kidney injury on presentation.  Her hospital course was remarkable for development of acute respiratory failure with hypoxia and had to be put on nonrebreather. Respiratory status gradually improved.Physical  Therapy evaluated her and recommended skilled nursing facility on discharge. Assessment & Plan:   Principal Problem:   AMS (altered mental status) Active Problems:   SLE (systemic lupus erythematosus) (HCC)   Hypothyroid   Essential hypertension, benign   Acute renal failure superimposed on stage 3 chronic kidney disease (HCC)   RTA (renal tubular acidosis)   Chronic diastolic CHF (congestive heart failure) (HCC)   Bipolar disease, chronic (HCC)   Acute respiratory failure with hypoxemia (HCC)   OSA (obstructive sleep apnea)  Acute hypoxic respiratory failure: On nasal cannula at 4L.min today.Respiratory status has  improved this morning with decreased oxygen requirement.   Chest x-ray done on 1/17 had some COPD changes versus atypical pneumonia.  Also started on antibiotics.  Follow-up cultures.NGTD Chest Xray today showed Cardiomegaly with diffuse bilateral pulmonary interstitial prominence and small bilateral pleural effusions.  Diastolic heart failure: Echocardiogram done here showed ejection fraction of 60 to 65%, severely dilated left atrium.  We will give her a dose of Lasix today.  He has basal crackles  Acute COPD exacerbation: Smokes a pack a day since last several years.   Continue  bronchodilators and steroids.  Steroids changed to oral today.  Altered mental status: Has been admitted in the past with similar complaints more than once.  Her UDS was positive for benzodiazepine.  Mental status has improved this morning. She is currently alert and oriented.  We will continue to monitor mental status.  Acute kidney injury on CKD stage III: Her baseline creatinine is around 1.5.  Presented with creatinine of more than 2.  She does not look volume overloaded.  Her CKD might have advanced due to her SLE. Renal ultrasound showed diffusely increased echogenicity within the renal parenchyma, compatible with medical renal disease She needs to  follow-up with nephrology on discharge.  SLE: Continue Imuran, Plaquenil and prednisone.  Also on folic acid.  Microcytic anemia: MCV of 112.  Vitamin B12 level normal.  Already on folic acid supplementation and will continue those.  Her hemoglobin this morning is 8.7.  Iron studies shows normal iron level, ferritin and iron saturation.  No evidence of active bleeding.  History of renal tubular acidosis: On bicarb tablets at home.  Will resume those.  Hypertension: Continue amlodipine, Toprol.  Currently blood pressure stable.  History of bipolar disorder: On Lexapro and Seroquel which we will continue.  On Valium at home which could have made her altered.  Hold Valium.  We will also discontinue Seroquel because of her drowsiness  Hypothyroidism: Continue Synthyroid  Deconditioning/debility: Physical therapy and Occupational Therapy saw the patient and recommended skilled nursing facility on discharge.  Social worker consulted.        DVT prophylaxis: SCD Code Status: Full Family Communication: None present at the bedside Disposition Plan: Skilled nursing facility after she is medically stabilized  Consultants: PCCM  Procedures:None  Antimicrobials:  Anti-infectives (From admission, onward)   Start     Dose/Rate Route Frequency  Ordered Stop   07/26/18 1800  vancomycin (VANCOCIN) IVPB 1000 mg/200 mL premix  Status:  Discontinued     1,000 mg 200 mL/hr over 60 Minutes Intravenous Every 24 hours 07/26/18 1635 07/27/18 1446   07/26/18 1700  piperacillin-tazobactam (ZOSYN) IVPB 3.375 g     3.375 g 12.5 mL/hr over 240 Minutes Intravenous Every 8 hours 07/26/18 1635     07/26/18 1030  doxycycline (VIBRA-TABS) tablet 100 mg  Status:  Discontinued     100 mg Oral Every 12 hours 07/26/18 1027 07/26/18 1543   07/24/18 1000  hydroxychloroquine (PLAQUENIL) tablet 200 mg     200 mg Oral Daily 07/23/18 1601        Subjective: Patient seen and examined the bedside this morning.  Looks more comfortable than yesterday.  More alert.  Decreased oxygen requirement.  She was very emotional and tearful when she was told that she is going to the skilled nursing facility.  Objective: Vitals:   07/27/18 2342 07/28/18 0345 07/28/18 0744 07/28/18 1133  BP: (!) 149/88 (!) 148/95 (!) 186/114 (!) 150/100  Pulse: 66 63 77 70  Resp: 17 15 16 16   Temp: 98.8 F (37.1 C) 98.4 F (36.9 C) 98.6 F (37 C) 98.6 F (37 C)  TempSrc: Oral Oral Oral   SpO2: 90% 100% 93% 99%  Weight:      Height:        Intake/Output Summary (Last 24 hours) at 07/28/2018 1145 Last data filed at 07/28/2018 1100 Gross per 24 hour  Intake 2052.5 ml  Output 1 ml  Net 2051.5 ml   Filed Weights   07/24/18 0611  Weight: 80 kg    Examination:  General exam: No obvious distress  HEENT:PERRL,Oral mucosa moist, Ear/Nose normal on gross exam Respiratory system: Bilateral decreased air entry, bilateral basal crackles. Cardiovascular system: S1 & S2 heard, RRR. No JVD, murmurs, rubs, gallops or clicks. Gastrointestinal system: Abdomen is nondistended, soft and nontender. No organomegaly or masses felt. Normal bowel sounds heard. Central nervous system: Alert and oriented. No focal neurological deficits. Extremities: No edema, no clubbing ,no cyanosis, distal  peripheral pulses palpable. Skin: No rashes, lesions or ulcers,no icterus ,no pallor     Data Reviewed: I have personally reviewed following labs and imaging studies  CBC: Recent Labs  Lab 07/23/18 0954 07/23/18 1032 07/24/18 1005 07/25/18 0637 07/27/18 0544 07/28/18 0751  WBC 4.9  --  4.5 5.1 3.2* 8.6  NEUTROABS 3.7  --   --  3.3 2.5 6.6  HGB 9.0* 8.8* 8.5* 7.9* 7.9* 8.7*  HCT 26.3* 26.0* 22.2* 21.2* 22.9* 25.8*  MCV 123.5*  --  118.7* 117.8* 118.7* 118.3*  PLT 423*  --  378 349 328 326*   Basic Metabolic Panel: Recent Labs  Lab 07/23/18 0954 07/23/18 1032 07/24/18 1005 07/25/18 0637 07/27/18 0544 07/28/18 0751  NA 144 144 142 142 139 140  K 5.1 5.1 4.3 4.1 4.5 4.1  CL 118* 124* 115* 111 106 105  CO2 14*  --  17* 19* 23 24  GLUCOSE 109* 103* 113* 106* 140* 125*  BUN 34* 33* 26* 24* 24* 29*  CREATININE 2.57* 2.80* 2.14* 1.88* 2.04* 2.08*  CALCIUM 9.0  --  9.0 8.8* 8.6* 8.9   GFR: Estimated Creatinine Clearance: 31.7 mL/min (A) (by C-G formula based on SCr of 2.08 mg/dL (H)). Liver Function Tests: Recent Labs  Lab 07/23/18 [phone]  AST 30  ALT 17  ALKPHOS 63  BILITOT 0.3  PROT 7.4  ALBUMIN 3.9   No results for input(s): LIPASE, AMYLASE in the last 168 hours. Recent Labs  Lab 07/23/18 1545  AMMONIA 20   Coagulation Profile: No results for input(s): INR, PROTIME in the last 168 hours. Cardiac Enzymes: No results for input(s): CKTOTAL, CKMB, CKMBINDEX, TROPONINI in the last 168 hours. BNP (last 3 results) No results for input(s): PROBNP in the last 8760 hours. HbA1C: No results for input(s): HGBA1C in the last 72 hours. CBG: Recent Labs  Lab 07/23/18 0945  GLUCAP 114*   Lipid Profile: No results for input(s): CHOL, HDL, LDLCALC, TRIG, CHOLHDL, LDLDIRECT in the last 72 hours. Thyroid Function Tests: No results for input(s): TSH, T4TOTAL, FREET4, T3FREE, THYROIDAB in the last 72 hours. Anemia Panel: No results for input(s): VITAMINB12, FOLATE,  FERRITIN, TIBC, IRON, RETICCTPCT in the last 72 hours. Sepsis Labs: Recent Labs  Lab 07/23/18 1033  LATICACIDVEN 0.76    Recent Results (from the past 240 hour(s))  Urine culture     Status: Abnormal   Collection Time: 07/23/18  1:42 PM  Result Value Ref Range Status   Specimen Description URINE, CLEAN CATCH  Final   Special Requests   Final    NONE Performed at [facility], [place] [address] [place], [place] [address]    Culture MULTIPLE SPECIES PRESENT, SUGGEST RECOLLECTION (A)  Final   Report Status 07/24/2018 FINAL  Final  Culture, blood (routine x 2)     Status: None   Collection Time: 07/23/18  7:12 PM  Result Value Ref Range Status   Specimen Description BLOOD LEFT HAND  Final   Special Requests   Final    BOTTLES DRAWN AEROBIC ONLY Blood Culture adequate volume   Culture   Final    NO GROWTH 5 DAYS Performed at [facility], [place] [address], [place], [place] [address]    Report Status 07/28/2018 FINAL  Final  Culture, blood (routine x 2)     Status: None   Collection Time: 07/23/18  7:12 PM  Result Value Ref Range Status   Specimen Description BLOOD LEFT HAND  Final   Special Requests   Final    BOTTLES DRAWN AEROBIC ONLY Blood Culture adequate volume   Culture   Final    NO GROWTH 5 DAYS Performed at [facility], [place] [address] [place], [place] [address]    Report Status 07/28/2018 FINAL  Final  MRSA PCR Screening     Status: None   Collection Time: 07/26/18  4:25 PM  Result Value Ref Range Status   MRSA by PCR NEGATIVE NEGATIVE Final    Comment:        The GeneXpert MRSA Assay (FDA approved for NASAL specimens only), is one component of a comprehensive MRSA colonization surveillance program. It is not intended to diagnose MRSA infection nor to guide or monitor treatment for MRSA infections. Performed at [facility], [place] [address], [place], [place] [address]          Radiology Studies: Dg Chest 2 View  Result Date:  07/27/2018 CLINICAL DATA:  Respiratory failure. EXAM: CHEST - 2 VIEW COMPARISON:  CT 07/26/2017. FINDINGS: Mediastinum and hilar structures normal. Cardiomegaly with diffuse bilateral pulmonary interstitial prominence and small bilateral pleural effusions. No pneumothorax. No acute bony abnormality. IMPRESSION: Cardiomegaly with diffuse bilateral pulmonary interstitial prominence and small bilateral pleural effusions again noted. Similar findings on  prior exams. Again CHF and/or pneumonitis could present in this fashion. Electronically Signed   By: Marcello Moores  Register   On: 07/27/2018 07:30   Ct Chest Wo Contrast  Result Date: 07/26/2018 CLINICAL DATA:  58 year old female with history of COPD exacerbation. Bilateral upper chest soreness from coughing. EXAM: CT CHEST WITHOUT CONTRAST TECHNIQUE: Multidetector CT imaging of the chest was performed following the standard protocol without IV contrast. COMPARISON:  Chest CT 06/15/2018. FINDINGS: Cardiovascular: Heart size is borderline enlarged. Small volume of pericardial fluid and/or thickening, unlikely to be of any hemodynamic significance at this time. No associated pericardial calcification. Aortic atherosclerosis. No definite coronary artery calcifications. Mediastinum/Nodes: No definite pathologically enlarged mediastinal lymph nodes noted on today's noncontrast CT examination, although there is extensive fullness in the right hilar region which could indicate lymphadenopathy (poorly evaluated without IV contrast). Esophagus is unremarkable in appearance. No axillary lymphadenopathy. Lungs/Pleura: Trace right pleural effusion, most of which tracks in the inferior aspect of the right major fissure laterally. Extensive thickening of the peribronchovascular interstitium with surrounding ground-glass attenuation and associated interlobular septal thickening throughout the right lung, most evident in the right lower and middle lobes. Fullness in the right infrahilar  region inseparable from adjacent hilar structures. Left lung is clear. Diffuse bronchial wall thickening with moderate centrilobular and mild paraseptal emphysema. Focal architectural distortion in the anterior aspect of the right upper lobe near the apex, most compatible with an area of post infectious or inflammatory scarring. Upper Abdomen: Aortic atherosclerosis. Musculoskeletal: There are no aggressive appearing lytic or blastic lesions noted in the visualized portions of the skeleton. IMPRESSION: 1. The appearance of the chest is concerning for potential right hilar mass with associated right hilar lymphadenopathy. At this time, there is what appears to be asymmetric edema involving the right lower and middle lobes with small pleural effusion tracking in the right major fissure. Further evaluation with contrast enhanced chest CT is recommended to better evaluate these findings if there is clinical concern for malignancy. 2. Aortic atherosclerosis. 3. Diffuse bronchial wall thickening with moderate centrilobular and mild paraseptal emphysema; imaging findings suggestive of underlying COPD. Aortic Atherosclerosis (ICD10-I70.0) and Emphysema (ICD10-J43.9). Electronically Signed   By: Vinnie Langton M.D.   On: 07/26/2018 12:49   US Renal  Result Date: 07/27/2018 CLINICAL DATA:  Initial evaluation for acute renal injury EXAM: RENAL / URINARY TRACT ULTRASOUND COMPLETE COMPARISON:  Prior CT from 02/12/2018 FINDINGS: Right Kidney: Renal measurements: 10.3 x 6.1 x 6.7 cm = volume: 214.8 mL. Diffusely increased echogenicity, compatible with medical renal disease. No hydronephrosis. 7 x 6 x 10 mm simple cyst present at the interpolar region. Few small cyst present at the lower pole the right kidney, largest measuring 1.2 x 1.2 x 1.3 cm. Left Kidney: Renal measurements: 8.7 x 4.3 x 4.2 cm = volume: 82.6 mL. Diffusely increased echogenicity, compatible with medical renal disease. No hydronephrosis. No focal renal  mass. Bladder: Appears normal for degree of bladder distention. Bilateral ureteral jets visualized. IMPRESSION: 1. Diffusely increased echogenicity within the renal parenchyma, compatible with medical renal disease. 2. No hydronephrosis. 3. Simple right renal cysts as above. Electronically Signed   By: Jeannine Boga M.D.   On: 07/27/2018 00:26        Scheduled Meds: . amLODipine  10 mg Oral Daily  . aspirin EC  81 mg Oral Daily  . atorvastatin  20 mg Oral Daily  . azaTHIOprine  50 mg Oral BID  . calcitRIOL  0.25 mcg Oral Daily  .  docusate sodium  100 mg Oral BID  . escitalopram  30 mg Oral Daily  . folic acid  1 mg Oral Daily  . furosemide  40 mg Intravenous Once  . guaiFENesin  600 mg Oral BID  . hydroxychloroquine  200 mg Oral Daily  . levothyroxine  50 mcg Oral Q0600  . metoprolol succinate  50 mg Oral Daily  . pantoprazole  40 mg Oral Daily  . polyethylene glycol  17 g Oral BID  . [START ON 07/29/2018] predniSONE  40 mg Oral Q breakfast  . sodium bicarbonate  1,300 mg Oral BID  . vitamin B-12  1,000 mcg Oral Daily   Continuous Infusions: . sodium chloride 250 mL (07/27/18 2125)  . lactated ringers 50 mL/hr at 07/28/18 1100  . piperacillin-tazobactam (ZOSYN)  IV 3.375 g (07/28/18 0607)     LOS: 4 days    Time spent: 35 mins.More than 50% of that time was spent in counseling and/or coordination of care.      Shelly Coss, MD [facility] Pager [phone]  If 7PM-7AM, please contact night-coverage [email or web address] Password [email or web address] 07/28/2018, 11:45 AM

## 2018-07-28 NOTE — Plan of Care (Signed)

## 2018-07-28 NOTE — NC FL2 (Signed)
[place] LEVEL OF CARE SCREENING TOOL     IDENTIFICATION  Patient Name: Anna [name] Birthdate: 05/14/1961 Sex: female Admission Date (Current Location): 07/23/2018  [facility] and [place] Number:  Herbalist and Address:  [facility] [place], [place] [address], [place], [place] [address]      Provider Number: [id number]  Attending Physician Name and Address:  Shelly Coss, MD  Relative Name and Phone Number:       Current Level of Care: Hospital Recommended Level of Care: [place] Prior Approval Number:    Date Approved/Denied:   PASRR Number: [id number]  Discharge Plan: SNF    Current Diagnoses: Patient Active Problem List   Diagnosis Date Noted  . Acute respiratory failure with hypoxemia ([place])   . OSA (obstructive sleep apnea)   . AMS (altered mental status) 07/23/2018  . Chronic diastolic CHF (congestive heart failure) ([place]) 07/23/2018  . Bipolar disease, chronic ([place]) 07/23/2018  . Elevated troponin 06/16/2018  . CKD (chronic kidney disease), stage III ([place]) 06/16/2018  . Acute renal failure superimposed on stage 3 chronic kidney disease ([place]) 06/15/2018  . 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 Weakness on right side of face 06/15/2018  . Constipation in female 02/12/2018  . Orthostatic hypotension 02/12/2018  . History of TIA (transient ischemic attack) 02/12/2018  . Intractable nausea and vomiting 02/12/2018  . Pulmonary hypertension ([place]) 08/25/2016  . Pleural effusion   . Pulmonary edema   . SIRS (systemic inflammatory response syndrome) (HCC)   . Accidental drug overdose   . Acute diastolic CHF (congestive heart failure) ([place])   . HCAP (healthcare-associated pneumonia)   . Long-term use of Plaquenil   . Anemia 08/04/2016  . Acute respiratory failure with hypoxia ([place]) 08/04/2016  . Acute encephalopathy 08/04/2016  . Migraine without aura 12/09/2012  . Unspecified constipation 12/08/2012   . GERD (gastroesophageal reflux disease) 12/08/2012  . Multinodular goiter 08/20/2011  . Essential hypertension, benign 12/25/2010  . SLE (systemic lupus erythematosus) ([place]) 11/27/2010  . Depression 11/27/2010  . Hyperlipidemia 11/27/2010  . Hypothyroid 11/27/2010  . Encounter for long-term (current) use of other medications 11/27/2010    Orientation RESPIRATION BLADDER Height & Weight     Self, Time, Situation, Place  O2(see DC summary) Continent Weight: 176 lb 5.9 oz (80 kg) Height:  5\' 7"  (170.2 cm)  BEHAVIORAL SYMPTOMS/MOOD NEUROLOGICAL BOWEL NUTRITION STATUS      Continent Diet(see DC summary)  AMBULATORY STATUS COMMUNICATION OF NEEDS Skin   Limited Assist Verbally Normal                       Personal Care Assistance Level of Assistance  Bathing, Feeding, Dressing Bathing Assistance: Limited assistance Feeding assistance: Independent Dressing Assistance: Limited assistance     Functional Limitations Info  Sight, Hearing, Speech Sight Info: Adequate Hearing Info: Adequate Speech Info: Adequate    SPECIAL CARE FACTORS FREQUENCY  PT (By licensed PT), OT (By licensed OT)     PT Frequency: 5x/wk OT Frequency: 5x/wk            Contractures Contractures Info: Not present    Additional Factors Info  Code Status, Allergies, Psychotropic Code Status Info: Full Allergies Info: NKA Psychotropic Info: Lexapro 30mg  daily         Current Medications (07/28/2018):  This is the current hospital active medication list Current Facility-Administered Medications  Medication Dose Route Frequency Provider Last Rate Last Dose  .  furosemide (LASIX) 10 MG/ML injection           . 0.9 %  sodium chloride infusion   Intravenous PRN Shelly Coss, MD 10 mL/hr at 07/27/18 2125 250 mL at 07/27/18 2125  . acetaminophen (TYLENOL) tablet 650 mg  650 mg Oral Q6H PRN Karmen Bongo, MD   650 mg at 07/27/18 0022   Or  . acetaminophen (TYLENOL) suppository 650 mg  650 mg  Rectal Q6H PRN Karmen Bongo, MD      . amLODipine (NORVASC) tablet 10 mg  10 mg Oral Daily Karmen Bongo, MD   10 mg at 07/28/18 1003  . aspirin EC tablet 81 mg  81 mg Oral Daily Karmen Bongo, MD   81 mg at 07/28/18 1001  . atorvastatin (LIPITOR) tablet 20 mg  20 mg Oral Daily Karmen Bongo, MD   20 mg at 07/28/18 1000  . azaTHIOprine (IMURAN) tablet 50 mg  50 mg Oral BID Karmen Bongo, MD   50 mg at 07/28/18 1002  . benzonatate (TESSALON) capsule 200 mg  200 mg Oral TID PRN Arby Barrette A, NP   200 mg at 07/28/18 1006  . calcitRIOL (ROCALTROL) capsule 0.25 mcg  0.25 mcg Oral Daily Karmen Bongo, MD   0.25 mcg at 07/28/18 1003  . docusate sodium (COLACE) capsule 100 mg  100 mg Oral BID Karmen Bongo, MD   100 mg at 07/28/18 1002  . escitalopram (LEXAPRO) tablet 30 mg  30 mg Oral Daily Karmen Bongo, MD   30 mg at 07/28/18 0959  . folic acid (FOLVITE) tablet 1 mg  1 mg Oral Daily Karmen Bongo, MD   1 mg at 07/28/18 1002  . guaiFENesin (MUCINEX) 12 hr tablet 600 mg  600 mg Oral BID Fuller Plan A, MD   600 mg at 07/28/18 2774  . hydroxychloroquine (PLAQUENIL) tablet 200 mg  200 mg Oral Daily Karmen Bongo, MD   200 mg at 07/28/18 1001  . ipratropium-albuterol (DUONEB) 0.5-2.5 (3) MG/3ML nebulizer solution 3 mL  3 mL Nebulization Q6H PRN Adhikari, Amrit, MD      . lactated ringers infusion   Intravenous Continuous Darlina Sicilian A, NP 50 mL/hr at 07/28/18 1100    . levothyroxine (SYNTHROID, LEVOTHROID) tablet 50 mcg  50 mcg Oral Q0600 Karmen Bongo, MD   50 mcg at 07/28/18 0606  . metoprolol succinate (TOPROL-XL) 24 hr tablet 50 mg  50 mg Oral Daily Karmen Bongo, MD   50 mg at 07/28/18 1001  . morphine 2 MG/ML injection 2 mg  2 mg Intravenous Q4H PRN Shelly Coss, MD   2 mg at 07/28/18 0007  . ondansetron (ZOFRAN) tablet 4 mg  4 mg Oral Q6H PRN Karmen Bongo, MD       Or  . ondansetron [facility]) injection 4 mg  4 mg Intravenous Q6H PRN Karmen Bongo,  MD   4 mg at 07/25/18 1746  . pantoprazole (PROTONIX) EC tablet 40 mg  40 mg Oral Daily Karmen Bongo, MD   40 mg at 07/28/18 1002  . piperacillin-tazobactam (ZOSYN) IVPB 3.375 g  3.375 g Intravenous Q8H Leodis Sias, RPH 12.5 mL/hr at 07/28/18 0607 3.375 g at 07/28/18 1287  . polyethylene glycol (MIRALAX / GLYCOLAX) packet 17 g  17 g Oral BID Karmen Bongo, MD   17 g at 07/28/18 0959  . [START ON 07/29/2018] predniSONE (DELTASONE) tablet 40 mg  40 mg Oral Q breakfast Adhikari, Amrit, MD      . sodium bicarbonate tablet  1,300 mg  1,300 mg Oral BID Karmen Bongo, MD   1,300 mg at 07/28/18 1000  . vitamin B-12 (CYANOCOBALAMIN) tablet 1,000 mcg  1,000 mcg Oral Daily Karmen Bongo, MD   1,000 mcg at 07/28/18 1007     Discharge Medications: Please see discharge summary for a list of discharge medications.  Relevant Imaging Results:  Relevant Lab Results:   Additional Information SS#: [id number]  Geralynn Ochs, LCSW

## 2018-07-28 NOTE — Care Management Note (Addendum)
Case Management Note  Patient Details  Name: Anna [name] MRN: [id number] Date of Birth: 04/27/1961  Subjective/Objective:     Pt in with altered mental status. She is from home with spouse.  Spouse works during the day and is not able to provide needed supervision at home.  DME: none No issues obtaining her medications.  No issues with transportation.            Action/Plan: CM met with the patient and she is reluctantly agreeable to SNF rehab. She knows she needs to go but wants to get home as soon as possible. She is agreeable to be faxed out in the [place] area. She was agreeable to CM speaking with her spouse.  CM called Mr Reyburn and he really wants her to have some rehab prior to returning home. He feels she needs the therapy and strengthening.  CSW updated. CM following for d/c disposition.  Expected Discharge Date:                  Expected Discharge Plan:  Skilled Nursing Facility  In-House Referral:  Clinical Social Work  Discharge planning Services  CM Consult  Post Acute Care Choice:    Choice offered to:     DME Arranged:    DME Agency:     HH Arranged:    HH Agency:     Status of Service:  In process, will continue to follow  If discussed at Long Length of Stay Meetings, dates discussed:    Additional Comments:   F , RN 07/28/2018, 2:21 PM  

## 2018-07-28 NOTE — Progress Notes (Signed)
Occupational Therapy Treatment Patient Details Name: Anna [name] MRN: [id number] DOB: Feb 27, 1961 Today's Date: 07/28/2018    History of present illness Patient is a 58 y/o female who presents from kidney MD with AMS. Admitted with acute encephalopathy secondary to possible BZD?  AKI, acidosis? PMH includes bipolar disorder, CHF, CVA, lupus, CKD, HTN.   OT comments  Session focused on dynamic sitting and standing balance during ADLs. Pt still requiring cueing for sequencing/ technique during bed mobility and functional transfers. Pt remains confused re location and condition despite reorienting cues. Min guard provided during sit <> stand and stand pivot transfers. SNF remains appropriate d/c location d/t lack of 24/7 (S) at home required for cognitive deficits.    Follow Up Recommendations  SNF;Supervision/Assistance - 24 hour    Equipment Recommendations  Other (comment)(defer to next venue)    Recommendations for Other Services      Precautions / Restrictions Precautions Precautions: Fall Precaution Comments: watch 02 Restrictions Weight Bearing Restrictions: No       Mobility Bed Mobility Overal bed mobility: Modified Independent             General bed mobility comments: cueing for UE placement/technique to power up into sitting   Transfers Overall transfer level: Needs assistance Equipment used: None Transfers: Sit to/from Stand;Stand Pivot Transfers Sit to Stand: Min guard Stand pivot transfers: Min guard       General transfer comment: min guard provided for safety     Balance Overall balance assessment: History of Falls;Needs assistance Sitting-balance support: Feet supported;No upper extremity supported Sitting balance-Leahy Scale: Good Sitting balance - Comments: Able to reach outside BOS while sitting EOB and reaching small object on floor   Standing balance support: During functional activity Standing balance-Leahy Scale: Poor Standing balance  comment: using no AD                           ADL either performed or assessed with clinical judgement   ADL Overall ADL's : Needs assistance/impaired Eating/Feeding: Independent   Grooming: Supervision/safety;Sitting                               Functional mobility during ADLs: Min guard General ADL Comments: Pt edu on importance of monitoring SpO2               Cognition Arousal/Alertness: Awake/alert Behavior During Therapy: WFL for tasks assessed/performed Overall Cognitive Status: Impaired/Different from baseline Area of Impairment: Memory;Awareness                 Orientation Level: Disoriented to;Place;Situation   Memory: Decreased short-term memory   Safety/Judgement: Decreased awareness of safety;Decreased awareness of deficits Awareness: Emergent   General Comments: Pt very confused re series of events that brought her to the hospital              General Comments Pt on [place] during SPT, d/t long nails was unable to get reliable O2 reading- RN aware    Pertinent Vitals/ Pain       Pain Assessment: No/denies pain         Frequency  Min 2X/week        Progress Toward Goals  OT Goals(current goals can now be found in the care plan section)  Progress towards OT goals: Progressing toward goals  Acute Rehab OT Goals Patient Stated Goal: "to go home" OT Goal Formulation: With patient  Time For Goal Achievement: 08/08/18 Potential to Achieve Goals: Good  Plan Discharge plan remains appropriate       AM-PAC OT "6 Clicks" Daily Activity     Outcome Measure   Help from another person eating meals?: None Help from another person taking care of personal grooming?: A Little Help from another person toileting, which includes using toliet, bedpan, or urinal?: A Little Help from another person bathing (including washing, rinsing, drying)?: A Little Help from another person to put on and taking off regular upper body  clothing?: None Help from another person to put on and taking off regular lower body clothing?: A Little 6 Click Score: 20    End of Session Equipment Utilized During Treatment: Oxygen  OT Visit Diagnosis: Unsteadiness on feet (R26.81);Other abnormalities of gait and mobility (R26.89);Repeated falls (R29.6);History of falling (Z91.81);Muscle weakness (generalized) (M62.81);Other symptoms and signs involving cognitive function   Activity Tolerance Patient tolerated treatment well   Patient Left in chair;with call bell/phone within reach;with chair alarm set   Nurse Communication Mobility status;Precautions        Time: 1216-2446 OT Time Calculation (min): 25 min  Charges: OT General Charges $OT Visit: 1 Visit OT Treatments $Self Care/Home Management : 23-37 mins   Curtis Sites OTR/L  07/28/2018, 11:00 AM

## 2018-07-28 NOTE — Progress Notes (Signed)
CSW received alert from [facility] that patient is now agreeable to SNF. CSW completed referral and faxed out to [facility] area, will follow up with bed offers when available.  CSW to follow.  Laveda Abbe, [place] Clinical Social Worker [phone]

## 2018-07-29 LAB — CBC WITH DIFFERENTIAL/PLATELET
Abs Immature Granulocytes: 0 10*3/uL (ref 0.00–0.07)
BLASTS: 2 %
Basophils Absolute: 0 10*3/uL (ref 0.0–0.1)
Basophils Relative: 0 %
Eosinophils Absolute: 0 10*3/uL (ref 0.0–0.5)
Eosinophils Relative: 0 %
HCT: 22 % — ABNORMAL LOW (ref 36.0–46.0)
Hemoglobin: 7.9 g/dL — ABNORMAL LOW (ref 12.0–15.0)
LYMPHS ABS: 2 10*3/uL (ref 0.7–4.0)
Lymphocytes Relative: 31 %
MCH: 42.7 pg — ABNORMAL HIGH (ref 26.0–34.0)
MCHC: 35.9 g/dL (ref 30.0–36.0)
MCV: 118.9 fL — ABNORMAL HIGH (ref 80.0–100.0)
Monocytes Absolute: 0.8 10*3/uL (ref 0.1–1.0)
Monocytes Relative: 12 %
Neutro Abs: 3.6 10*3/uL (ref 1.7–7.7)
Neutrophils Relative %: 55 %
Platelets: 308 10*3/uL (ref 150–400)
RBC: 1.85 MIL/uL — ABNORMAL LOW (ref 3.87–5.11)
RDW: 20.1 % — ABNORMAL HIGH (ref 11.5–15.5)
WBC: 6.6 10*3/uL (ref 4.0–10.5)
nRBC: 0 /100 WBC
nRBC: 0.3 % — ABNORMAL HIGH (ref 0.0–0.2)

## 2018-07-29 LAB — CREATININE CLEARANCE, URINE, 24 HOUR
COLLECTION INTERVAL-CRCL: 24 h
Creatinine Clearance: 36 mL/min — ABNORMAL LOW (ref 75–115)
Creatinine, 24H Ur: 1102 mg/d (ref 600–1800)
Creatinine, Urine: 50.1 mg/dL
Urine Total Volume-CRCL: 2200 mL

## 2018-07-29 LAB — EXPECTORATED SPUTUM ASSESSMENT W GRAM STAIN, RFLX TO RESP C

## 2018-07-29 LAB — BASIC METABOLIC PANEL
Anion gap: 9 (ref 5–15)
BUN: 28 mg/dL — ABNORMAL HIGH (ref 6–20)
CO2: 29 mmol/L (ref 22–32)
Calcium: 8.4 mg/dL — ABNORMAL LOW (ref 8.9–10.3)
Chloride: 102 mmol/L (ref 98–111)
Creatinine, Ser: 2.13 mg/dL — ABNORMAL HIGH (ref 0.44–1.00)
GFR calc Af Amer: 29 mL/min — ABNORMAL LOW (ref >60)
GFR calc non Af Amer: 25 mL/min — ABNORMAL LOW (ref >60)
GLUCOSE: 93 mg/dL (ref 70–99)
Potassium: 3.7 mmol/L (ref 3.5–5.1)
Sodium: 140 mmol/L (ref 135–145)

## 2018-07-29 LAB — SODIUM, URINE, RANDOM: SODIUM UR: 126 mmol/L

## 2018-07-29 LAB — ANTI-DNA ANTIBODY, DOUBLE-STRANDED: ds DNA Ab: 36 IU/mL — ABNORMAL HIGH (ref 0–9)

## 2018-07-29 LAB — C3 COMPLEMENT: C3 Complement: 105 mg/dL (ref 82–167)

## 2018-07-29 LAB — C4 COMPLEMENT: Complement C4, Body Fluid: 21 mg/dL (ref 14–44)

## 2018-07-29 MED ORDER — HYDRALAZINE HCL 25 MG PO TABS
25.0000 mg | ORAL_TABLET | Freq: Three times a day (TID) | ORAL | Status: DC
  Administered 2018-07-29 – 2018-07-30 (×4): 25 mg via ORAL
  Filled 2018-07-29 (×4): qty 1

## 2018-07-29 MED ORDER — AMOXICILLIN-POT CLAVULANATE 875-125 MG PO TABS
1.0000 | ORAL_TABLET | Freq: Two times a day (BID) | ORAL | Status: AC
  Administered 2018-07-29 – 2018-08-01 (×8): 1 via ORAL
  Filled 2018-07-29 (×8): qty 1

## 2018-07-29 NOTE — Progress Notes (Addendum)
PROGRESS NOTE    Anna [name]  EGB:[id number] DOB: 1961-04-29 DOA: 07/23/2018 PCP: [name], MD   Brief Narrative: Patient is a 58 year old female with history of CVA, SLE, hyperlipidemia, hypothyroidism, hypertension, bipolar disorder, CHF who presented to the emergency department with altered mental status.  She has multiple admissions in the past with a similar scenerio.  Her altered mental status was attributed to benzodiazepines.  Patient was also found to be in acute kidney injury on presentation.  Her hospital course was remarkable for development of acute respiratory failure with hypoxia and had to be put on nonrebreather. Respiratory status has gradually improved.Physical  Therapy evaluated her and recommended skilled nursing facility on discharge. Assessment & Plan:   Principal Problem:   AMS (altered mental status) Active Problems:   SLE (systemic lupus erythematosus) (HCC)   Hypothyroid   Essential hypertension, benign   Acute renal failure superimposed on stage 3 chronic kidney disease (HCC)   RTA (renal tubular acidosis)   Chronic diastolic CHF (congestive heart failure) (HCC)   Bipolar disease, chronic (HCC)   Acute respiratory failure with hypoxemia (HCC)   OSA (obstructive sleep apnea)  Acute hypoxic respiratory failure: Respiratory status has  improved this morning .  She was maintaining saturation on room air. Chest x-ray done on 1/17 had some COPD changes versus atypical pneumonia. Cultures NGTD Chest Xray had shown  showed Cardiomegaly with diffuse bilateral pulmonary interstitial prominence and small bilateral pleural effusions. Will discontinue Zosyn and continue on Augmentin for now.  Diastolic heart failure: Echocardiogram done here showed ejection fraction of 60 to 65%, severely dilated left atrium.  Gave her a dose of Lasix today.  Currently euvolemic.  Acute COPD exacerbation: Smokes a pack a day since last several years.   Continue bronchodilators  and steroids.  Steroids changed to oral .  Altered mental status: Has been admitted in the past with similar complaints more than once.  Her UDS was positive for benzodiazepine.  Mental status has improved . She is currently alert and oriented.  We will continue to monitor mental status.  Acute kidney injury on CKD stage III: Her baseline creatinine is around 1.5.  Presented with creatinine of more than 2.  She does not look volume overloaded.  Her CKD might have advanced due to her SLE. Renal ultrasound showed diffusely increased echogenicity within the renal parenchyma, compatible with medical renal disease She needs to  follow-up with nephrology on discharge. Discussed with on-call nephrologist yesterday.  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  C3/C4 negative, pending double-stranded DNA, urine FeNa  SLE: Continue Imuran, Plaquenil and prednisone.  Also on folic acid.  Macrocytic anemia: MCV of 112.  Vitamin B12 level normal.  Already on folic acid supplementation and will continue those.  Her hemoglobin this morning is 7.9.  Iron studies shows normal iron level, ferritin and iron saturation.  No evidence of active bleeding.  History of renal tubular acidosis: On bicarb tablets at home.  Will resume those.  Hypertension: Hypertensive today.  Started on hydralazine.  Continue amlodipine and Toprol.  History of bipolar disorder: On Lexapro and Seroquel at home.  On Valium at home which could have made her altered.  Hold Valium.  We held  Seroquel because of her drowsiness.  Might continue on discharge  Hypothyroidism: Continue Synthyroid  Deconditioning/debility: Physical therapy and Occupational Therapy saw the patient and recommended skilled nursing facility on discharge.  Social worker consulted.        DVT prophylaxis: SCD Code  Status: Full Family Communication: Discussed with the husband on phone Disposition Plan: Skilled nursing facility as soon as the bed is  available.  Consultants: PCCM  Procedures:None  Antimicrobials:  Anti-infectives (From admission, onward)   Start     Dose/Rate Route Frequency Ordered Stop   07/26/18 1800  vancomycin (VANCOCIN) IVPB 1000 mg/200 mL premix  Status:  Discontinued     1,000 mg 200 mL/hr over 60 Minutes Intravenous Every 24 hours 07/26/18 1635 07/27/18 1446   07/26/18 1700  piperacillin-tazobactam (ZOSYN) IVPB 3.375 g  Status:  Discontinued     3.375 g 12.5 mL/hr over 240 Minutes Intravenous Every 8 hours 07/26/18 1635 07/29/18 0854   07/26/18 1030  doxycycline (VIBRA-TABS) tablet 100 mg  Status:  Discontinued     100 mg Oral Every 12 hours 07/26/18 1027 07/26/18 1543   07/24/18 1000  hydroxychloroquine (PLAQUENIL) tablet 200 mg     200 mg Oral Daily 07/23/18 1601        Subjective: Patient seen and examined the bedside this morning.  Respiratory status much better today.  Saturating fine on room air.  Looks more comfortable today.  Objective: Vitals:   07/28/18 2359 07/29/18 0351 07/29/18 0743 07/29/18 1105  BP: (!) 153/88 (!) 155/93 (!) 173/83 (!) 154/87  Pulse: (!) 55 (!) 59 67 65  Resp: 15 18 18 17   Temp: 98.7 F (37.1 C) 98.6 F (37 C) 98 F (36.7 C) 98.1 F (36.7 C)  TempSrc: Oral Oral    SpO2: 98% 98% 100% 98%  Weight:      Height:        Intake/Output Summary (Last 24 hours) at 07/29/2018 1116 Last data filed at 07/29/2018 0856 Gross per 24 hour  Intake 1498.61 ml  Output 1350 ml  Net 148.61 ml   Filed Weights   07/24/18 0611  Weight: 80 kg    Examination:   General exam: No obvious distress  HEENT:PERRL,Oral mucosa moist, Ear/Nose normal on gross exam Respiratory system: Bilateral coarse breathing sounds, decreased air entry on the bases Cardiovascular system: S1 & S2 heard, RRR. No JVD, murmurs, rubs, gallops or clicks. Gastrointestinal system: Abdomen is nondistended, soft and nontender. No organomegaly or masses felt. Normal bowel sounds heard. Central nervous  system: Alert and oriented. No focal neurological deficits. Extremities: No edema, no clubbing ,no cyanosis, distal peripheral pulses palpable. Skin: No rashes, lesions or ulcers,no icterus ,no pallor MSK: Normal muscle bulk,tone ,power Psychiatry: Judgement and insight appear normal. Mood & affect appropriate.      Data Reviewed: I have personally reviewed following labs and imaging studies  CBC: Recent Labs  Lab 07/23/18 0954  07/24/18 1005 07/25/18 0637 07/27/18 0544 07/28/18 0751 07/29/18 0533  WBC 4.9  --  4.5 5.1 3.2* 8.6 6.6  NEUTROABS 3.7  --   --  3.3 2.5 6.6 3.6  HGB 9.0*   < > 8.5* 7.9* 7.9* 8.7* 7.9*  HCT 26.3*   < > 22.2* 21.2* 22.9* 25.8* 22.0*  MCV 123.5*  --  118.7* 117.8* 118.7* 118.3* 118.9*  PLT 423*  --  378 349 328 411* 308   < > = values in this interval not displayed.   Basic Metabolic Panel: Recent Labs  Lab 07/24/18 1005 07/25/18 0637 07/27/18 0544 07/28/18 0751 07/29/18 0533  NA 142 142 139 140 140  K 4.3 4.1 4.5 4.1 3.7  CL 115* 111 106 105 102  CO2 17* 19* 23 24 29   GLUCOSE 113* 106* 140* 125* 93  BUN 26* 24* 24* 29* 28*  CREATININE 2.14* 1.88* 2.04* 2.08* 2.13*  CALCIUM 9.0 8.8* 8.6* 8.9 8.4*   GFR: Estimated Creatinine Clearance: 31 mL/min (A) (by C-G formula based on SCr of 2.13 mg/dL (H)). Liver Function Tests: Recent Labs  Lab 07/23/18 0954  AST 30  ALT 17  ALKPHOS 63  BILITOT 0.3  PROT 7.4  ALBUMIN 3.9   No results for input(s): LIPASE, AMYLASE in the last 168 hours. Recent Labs  Lab 07/23/18 1545  AMMONIA 20   Coagulation Profile: No results for input(s): INR, PROTIME in the last 168 hours. Cardiac Enzymes: No results for input(s): CKTOTAL, CKMB, CKMBINDEX, TROPONINI in the last 168 hours. BNP (last 3 results) No results for input(s): PROBNP in the last 8760 hours. HbA1C: No results for input(s): HGBA1C in the last 72 hours. CBG: Recent Labs  Lab 07/23/18 0945  GLUCAP 114*   Lipid Profile: No results for  input(s): CHOL, HDL, LDLCALC, TRIG, CHOLHDL, LDLDIRECT in the last 72 hours. Thyroid Function Tests: No results for input(s): TSH, T4TOTAL, FREET4, T3FREE, THYROIDAB in the last 72 hours. Anemia Panel: No results for input(s): VITAMINB12, FOLATE, FERRITIN, TIBC, IRON, RETICCTPCT in the last 72 hours. Sepsis Labs: Recent Labs  Lab 07/23/18 1033  LATICACIDVEN 0.76    Recent Results (from the past 240 hour(s))  Urine culture     Status: Abnormal   Collection Time: 07/23/18  1:42 PM  Result Value Ref Range Status   Specimen Description URINE, CLEAN CATCH  Final   Special Requests   Final    NONE Performed at [facility], [place] [address] [place], [place] [address]    Culture MULTIPLE SPECIES PRESENT, SUGGEST RECOLLECTION (A)  Final   Report Status 07/24/2018 FINAL  Final  Culture, blood (routine x 2)     Status: None   Collection Time: 07/23/18  7:12 PM  Result Value Ref Range Status   Specimen Description BLOOD LEFT HAND  Final   Special Requests   Final    BOTTLES DRAWN AEROBIC ONLY Blood Culture adequate volume   Culture   Final    NO GROWTH 5 DAYS Performed at [facility], [place] [address] [place], [place] [address]    Report Status 07/28/2018 FINAL  Final  Culture, blood (routine x 2)     Status: None   Collection Time: 07/23/18  7:12 PM  Result Value Ref Range Status   Specimen Description BLOOD LEFT HAND  Final   Special Requests   Final    BOTTLES DRAWN AEROBIC ONLY Blood Culture adequate volume   Culture   Final    NO GROWTH 5 DAYS Performed at [facility], [place] [address], [place], [place] [address]    Report Status 07/28/2018 FINAL  Final  MRSA PCR Screening     Status: None   Collection Time: 07/26/18  4:25 PM  Result Value Ref Range Status   MRSA by PCR NEGATIVE NEGATIVE Final    Comment:        The GeneXpert MRSA Assay (FDA approved for NASAL specimens only), is one component of a comprehensive MRSA colonization surveillance program.  It is not intended to diagnose MRSA infection nor to guide or monitor treatment for MRSA infections. Performed at [facility], [place] [address] [place], [place] [address]          Radiology Studies: No results found.      Scheduled Meds: . amLODipine  10 mg  Oral Daily  . aspirin EC  81 mg Oral Daily  . atorvastatin  20 mg Oral Daily  . azaTHIOprine  50 mg Oral BID  . calcitRIOL  0.25 mcg Oral Daily  . docusate sodium  100 mg Oral BID  . escitalopram  30 mg Oral Daily  . folic acid  1 mg Oral Daily  . guaiFENesin  600 mg Oral BID  . hydrALAZINE  25 mg Oral Q8H  . hydroxychloroquine  200 mg Oral Daily  . levothyroxine  50 mcg Oral Q0600  . metoprolol succinate  50 mg Oral Daily  . pantoprazole  40 mg Oral Daily  . polyethylene glycol  17 g Oral BID  . predniSONE  40 mg Oral Q breakfast  . sodium bicarbonate  1,300 mg Oral BID  . vitamin B-12  1,000 mcg Oral Daily   Continuous Infusions: . sodium chloride 250 mL (07/27/18 2125)  . lactated ringers 50 mL/hr at 07/28/18 2132     LOS: 5 days    Time spent: 35 mins.More than 50% of that time was spent in counseling and/or coordination of care.      Shelly Coss, MD [facility] Pager [phone]  If 7PM-7AM, please contact night-coverage [email or web address] Password [facility] 07/29/2018, 11:16 AM

## 2018-07-29 NOTE — Progress Notes (Signed)
Physical Therapy Treatment Patient Details Name: Anna [name] MRN: [id number] DOB: Jan 03, 1961 Today's Date: 07/29/2018    History of Present Illness Patient is a 58 y/o female who presents from kidney MD with AMS. Admitted with acute encephalopathy secondary to possible BZD?  AKI, acidosis? PMH includes bipolar disorder, CHF, CVA, lupus, CKD, HTN.    PT Comments    Patient cognition improving but still deficits noted, at times inappropriately laughing at trivial things during gait session today, as well as short term memory. Pt ambulating 200' on RA without de-satting, SpO2 97%. Cont to rec SNF.      Follow Up Recommendations  SNF;Supervision for mobility/OOB     Equipment Recommendations  None recommended by PT    Recommendations for Other Services       Precautions / Restrictions Precautions Precautions: Fall Precaution Comments: watch 02 Restrictions Weight Bearing Restrictions: No    Mobility  Bed Mobility               General bed mobility comments: sitting EOB at entry  Transfers Overall transfer level: Needs assistance Equipment used: None Transfers: Sit to/from Stand;Stand Pivot Transfers Sit to Stand: Min guard Stand pivot transfers: Min guard       General transfer comment: min guard provided for safety   Ambulation/Gait Ambulation/Gait assistance: Min guard;Min assist Gait Distance (Feet): 100 Feet Assistive device: Rolling walker (2 wheeled) Gait Pattern/deviations: Step-through pattern;Decreased stride length;Trunk flexed Gait velocity: decreased   General Gait Details: unsteady gait, mild staggering and poor obstacle avoidance with RW. SpO2 WNL on RA    Stairs             Wheelchair Mobility    Modified Rankin (Stroke Patients Only)       Balance Overall balance assessment: History of Falls;Needs assistance Sitting-balance support: Feet supported;No upper extremity supported Sitting balance-Leahy Scale: Good Sitting  balance - Comments: Able to reach outside BOS while sitting EOB and reaching small object on floor     Standing balance-Leahy Scale: Poor Standing balance comment: using no AD                            Cognition Arousal/Alertness: Awake/alert Behavior During Therapy: WFL for tasks assessed/performed Overall Cognitive Status: Impaired/Different from baseline Area of Impairment: Memory;Awareness                 Orientation Level: Disoriented to;Place;Situation   Memory: Decreased short-term memory   Safety/Judgement: Decreased awareness of safety;Decreased awareness of deficits Awareness: Emergent   General Comments: pt coudl not recall prior PT session, at times inappropriate gesturing or laughin at trivial things in hospital.       Exercises      General Comments        Pertinent Vitals/Pain Pain Assessment: No/denies pain    Home Living                      Prior Function            PT Goals (current goals can now be found in the care plan section) Acute Rehab PT Goals Patient Stated Goal: "to go home" PT Goal Formulation: With patient Time For Goal Achievement: 08/08/18 Potential to Achieve Goals: Good Progress towards PT goals: Progressing toward goals    Frequency    Min 3X/week      PT Plan Current plan remains appropriate    Co-evaluation  AM-PAC PT "6 Clicks" Mobility   Outcome Measure  Help needed turning from your back to your side while in a flat bed without using bedrails?: A Little Help needed moving from lying on your back to sitting on the side of a flat bed without using bedrails?: A Little Help needed moving to and from a bed to a chair (including a wheelchair)?: A Little Help needed standing up from a chair using your arms (e.g., wheelchair or bedside chair)?: A Little Help needed to walk in hospital room?: A Little Help needed climbing 3-5 steps with a railing? : A Lot 6 Click Score:  17    End of Session Equipment Utilized During Treatment: Gait belt;Oxygen Activity Tolerance: Treatment limited secondary to medical complications (Comment) Patient left: in bed;with bed alarm set;with nursing/sitter in room;with call bell/phone within reach Nurse Communication: Mobility status;Other (comment) PT Visit Diagnosis: Muscle weakness (generalized) (M62.81);Difficulty in walking, not elsewhere classified (R26.2);Unsteadiness on feet (R26.81)     Time: 1126-1140 PT Time Calculation (min) (ACUTE ONLY): 14 min  Charges:  $Gait Training: 8-22 mins                     Reinaldo Berber, PT, DPT Acute Rehabilitation Services Pager: [phone] Office: [phone]     Reinaldo Berber 07/29/2018, 12:48 PM

## 2018-07-29 NOTE — Progress Notes (Signed)
Pharmacy Antibiotic Note  Anna [name] is a 58 y.o. female with hx of lupus, on immunosuppression admitted on 07/23/2018 with pneumonia and acute hypoxic respiratory failure. Patient continues on Zosyn for empiric PNA coverage.  SCr slowly trending up to 2.13 today.  CrCl remains above 30ml/min cutoff for Zosyn dose adjustment.  Anticipate no further dose adjustments needed.  If CrCl falls <20, pharmacy will be alerted for dose adjustment via [employer].  Plan: Continue Zosyn 3.375g IV Q 8 hrs (4 hr infusion) Narrow / change to PO abx as clinically appropriate Rx will sign off.   Height: 5\' 7"  (170.2 cm) Weight: 176 lb 5.9 oz (80 kg) IBW/kg (Calculated) : 61.6  Temp (24hrs), Avg:98.5 F (36.9 C), Min:98 F (36.7 C), Max:98.7 F (37.1 C)  Recent Labs  Lab 07/23/18 1033 07/24/18 1005 07/25/18 0637 07/27/18 0544 07/28/18 0751 07/29/18 0533  WBC  --  4.5 5.1 3.2* 8.6 6.6  CREATININE  --  2.14* 1.88* 2.04* 2.08* 2.13*  LATICACIDVEN 0.76  --   --   --   --   --     Estimated Creatinine Clearance: 31 mL/min (A) (by C-G formula based on SCr of 2.13 mg/dL (H)).    No Known Allergies  Antimicrobials this admission: Vancomycin 1/20 x 1 dose  Zosyn 1/20 >>  Doxy 1/20 x 1 dose   Dose adjustments this admission:   Microbiology results: 1/17 BCx: ngtd 1/17 UCx: multiple species likely contaminated   Thank you for allowing pharmacy to be a part of this patient's care.  [employer], Pharm.D., BCPS Clinical Pharmacist  **Pharmacist phone directory can now be found on [email or web address] ([email or web address]).  Listed under [place].  07/29/2018 8:06 AM

## 2018-07-30 LAB — BASIC METABOLIC PANEL
Anion gap: 10 (ref 5–15)
BUN: 26 mg/dL — AB (ref 6–20)
CO2: 27 mmol/L (ref 22–32)
Calcium: 8.8 mg/dL — ABNORMAL LOW (ref 8.9–10.3)
Chloride: 103 mmol/L (ref 98–111)
Creatinine, Ser: 1.92 mg/dL — ABNORMAL HIGH (ref 0.44–1.00)
GFR calc Af Amer: 32 mL/min — ABNORMAL LOW (ref >60)
GFR calc non Af Amer: 28 mL/min — ABNORMAL LOW (ref >60)
Glucose, Bld: 94 mg/dL (ref 70–99)
Potassium: 3.3 mmol/L — ABNORMAL LOW (ref 3.5–5.1)
Sodium: 140 mmol/L (ref 135–145)

## 2018-07-30 MED ORDER — NIFEDIPINE ER 60 MG PO TB24: 60.0000 mg | ORAL_TABLET | Freq: Every day | ORAL | 0 refills | Status: DC

## 2018-07-30 MED ORDER — POTASSIUM CHLORIDE CRYS ER 20 MEQ PO TBCR
40.0000 meq | EXTENDED_RELEASE_TABLET | Freq: Once | ORAL | Status: AC
  Administered 2018-07-30: 40 meq via ORAL
  Filled 2018-07-30: qty 2

## 2018-07-30 MED ORDER — FERROUS SULFATE 325 (65 FE) MG PO TABS: 325.0000 mg | ORAL_TABLET | Freq: Every day | ORAL | 3 refills | Status: AC

## 2018-07-30 MED ORDER — ALPRAZOLAM 0.5 MG PO TABS
0.5000 mg | ORAL_TABLET | Freq: Three times a day (TID) | ORAL | Status: DC | PRN
  Administered 2018-07-30 – 2018-08-01 (×6): 0.5 mg via ORAL
  Filled 2018-07-30 (×6): qty 1

## 2018-07-30 MED ORDER — HYDRALAZINE HCL 50 MG PO TABS
50.0000 mg | ORAL_TABLET | Freq: Three times a day (TID) | ORAL | Status: DC
  Administered 2018-07-30 – 2018-08-02 (×9): 50 mg via ORAL
  Filled 2018-07-30 (×9): qty 1

## 2018-07-30 MED ORDER — HYDRALAZINE HCL 50 MG PO TABS: 50.0000 mg | ORAL_TABLET | Freq: Three times a day (TID) | ORAL | 0 refills | Status: DC

## 2018-07-30 MED ORDER — NIFEDIPINE ER OSMOTIC RELEASE 60 MG PO TB24
60.0000 mg | ORAL_TABLET | Freq: Every day | ORAL | Status: DC
  Administered 2018-07-30 – 2018-08-01 (×3): 60 mg via ORAL
  Filled 2018-07-30 (×3): qty 1

## 2018-07-30 MED ORDER — DIAZEPAM 10 MG PO TABS: 5.0000 mg | ORAL_TABLET | Freq: Three times a day (TID) | ORAL | 0 refills | Status: DC | PRN

## 2018-07-30 MED ORDER — POTASSIUM CHLORIDE ER 20 MEQ PO TBCR: 20.0000 meq | EXTENDED_RELEASE_TABLET | Freq: Every day | ORAL | 0 refills | Status: DC

## 2018-07-30 MED ORDER — AMOXICILLIN-POT CLAVULANATE 875-125 MG PO TABS: 1.0000 | ORAL_TABLET | Freq: Two times a day (BID) | ORAL | 0 refills | Status: DC

## 2018-07-30 NOTE — Progress Notes (Signed)
CSW following for discharge plan. Patient has still not received insurance authorization to admit to SNF. CSW received update from RN that patient was feeling frustrated and defeated; went to see patient and update her on authorization status. Patient was crying, saying that she really just wanted to be able to go home. Patient discussed how she missed her husband and her dog, and she knew that her husband would want her to come home, too. CSW explained how patient's husband was worried about her being at home on her own during the day, but patient said that her husband would want her home. CSW contacted patient's husband, and patient's husband is still saying that the patient would be better off at SNF instead.   CSW relayed information to patient. Patient also upset because she feels very anxious, upset that she has been cut off completely with her anxiety medication and she feels very stressed and upset. Patient crying during discussion. Patient agreeable to SNF.  CSW to follow.  Laveda Abbe,  Clinical Social Worker [phone]

## 2018-07-30 NOTE — Discharge Summary (Signed)
Physician Discharge Summary  Anna [name] JOA:[id number] DOB: [birth year] DOA: 07/23/2018  PCP: [name], MD  Admit date: 07/23/2018 Discharge date: 07/30/2018  Admitted From: Home Disposition:  SNF  Discharge Condition:Stable CODE STATUS:FULL Diet recommendation: Heart Healthy   Brief/Interim Summary: Patient is a 58 year old female with history of CVA, SLE, hyperlipidemia, hypothyroidism, hypertension, bipolar disorder, CHF who presented to the emergency department with altered mental status.  She has multiple admissions in the past with a similar scenerio.  Her altered mental status might be associated with benzodiazepines.She was on valium for anxiety.  Patient was also found to be in acute kidney injury on presentation.  Her hospital course was remarkable for development of acute respiratory failure with hypoxia and had to be put on nonrebreather. Respiratory status has gradually improved and currently on baseline..Physical  Therapy evaluated her and recommended skilled nursing facility on discharge.  She is completely alert and oriented at present.  Following problems were addressed during her hospitalization:  Acute hypoxic respiratory failure: Respiratory status has  improved this morning .  She is maintaining saturation on room air. Chest x-ray done on 1/17 had some COPD changes versus atypical pneumonia. Cultures NGTD Chest Xray had shown  showed Cardiomegaly with diffuse bilateral pulmonary interstitial prominence and small bilateral pleural effusions.  Pneumonia could not be ruled out.  Her respiratory status has improved with antibiotics. Continue Augmentin to complete the course.  Diastolic heart failure: Echocardiogram done here showed ejection fraction of 60 to 65%, severely dilated left atrium.    Currently euvolemic.  She was on spironolactone at home which we will discontinue because of acute kidney injury.  Acute COPD exacerbation: Smokes a pack a day since last  several years.   Continue bronchodilators and steroids.  She was treated with steroids.  She is on 5 mg prednisone at home which we will continue on discharge.  Altered mental status: Has been admitted in the past with similar complaints more than once.  Her UDS was positive for benzodiazepine.  Mental status has improved . She is currently alert and oriented.   Acute kidney injury on CKD stage III: Her baseline creatinine is around 1.5.  Presented with creatinine of more than 2.  She does not look volume overloaded.  Renal ultrasound showed diffusely increased echogenicity within the renal parenchyma, compatible with medical renal disease She needs to  follow-up with nephrology on discharge. Discussed with on-call nephrologist .  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  C3/C4 negative, elevated  double-stranded DNA. Continue bicarbonate tablets.  SLE: Continue Imuran, Plaquenil and prednisone.  Also on folic acid.  She needs to follow-up with rheumatology as an outpatient.  Macrocytic anemia: MCV of 112.  Vitamin B12 level normal.  Already on folic acid supplementation and will continue those.    Iron studies shows normal iron level, ferritin and iron saturation.  No evidence of active bleeding.  Continue iron supplementation also.  Hypertension: Hypertensive today.    Dose of hydralazine increased.  Started on Toprol.  Continue metoprolol  History of bipolar disorder: On Lexapro and Seroquel at home.  On Valium at home which could have made her altered.  Resume  Valium at low dose.  Follow-up with psychiatry as an outpatient.  Hypothyroidism: Continue Synthyroid  Deconditioning/debility: Physical therapy and Occupational Therapy saw the patient and recommended skilled nursing facility on discharge.     Discharge Diagnoses:  Principal Problem:   AMS (altered mental status) Active Problems:   SLE (systemic lupus erythematosus) ([place])  Hypothyroid   Essential hypertension,  benign   Acute renal failure superimposed on stage 3 chronic kidney disease (HCC)   RTA (renal tubular acidosis)   Chronic diastolic CHF (congestive heart failure) (HCC)   Bipolar disease, chronic (HCC)   Acute respiratory failure with hypoxemia (HCC)   OSA (obstructive sleep apnea)    Discharge Instructions  Discharge Instructions    Ambulatory referral to Nephrology   Complete by:  As directed    Follow up in 2 weeks   Diet - low sodium heart healthy   Complete by:  As directed    Discharge instructions   Complete by:  As directed    1) Please take prescribed medications as instructed. 2)Do a CBC and BMP test in a week. 3) Follow-up with nephrology in 2 weeks.  Name and number of the provider has been attached. 4)Follow up with your rheumatologist and psychiatrist as an outpatient   Increase activity slowly   Complete by:  As directed      Allergies as of 07/30/2018   No Known Allergies     Medication List    STOP taking these medications   amLODipine 10 MG tablet Commonly known as:  NORVASC   diphenhydrAMINE 25 MG tablet Commonly known as:  BENADRYL   psyllium 58.6 % powder Commonly known as:  METAMUCIL SMOOTH TEXTURE   spironolactone 25 MG tablet Commonly known as:  ALDACTONE     TAKE these medications   amoxicillin-clavulanate 875-125 MG tablet Commonly known as:  AUGMENTIN Take 1 tablet by mouth every 12 (twelve) hours.   aspirin EC 81 MG tablet Take 1 tablet (81 mg total) by mouth daily. What changed:  Another medication with the same name was removed. Continue taking this medication, and follow the directions you see here.   atorvastatin 20 MG tablet Commonly known as:  LIPITOR Take 20 mg by mouth daily.   azaTHIOprine 50 MG tablet Commonly known as:  IMURAN Take 50 mg by mouth 2 (two) times daily.   calcitRIOL 0.25 MCG capsule Commonly known as:  ROCALTROL Take 0.25 mcg by mouth daily.   diazepam 10 MG tablet Commonly known as:   VALIUM Take 0.5 tablets (5 mg total) by mouth every 8 (eight) hours as needed for anxiety. What changed:  how much to take   escitalopram 10 MG tablet Commonly known as:  LEXAPRO Take 3 tablets (30 mg total) by mouth daily.   ferrous sulfate 325 (65 FE) MG tablet Take 1 tablet (325 mg total) by mouth daily. What changed:    medication strength  how much to take   folic acid 1 MG tablet Commonly known as:  FOLVITE Take 1 mg by mouth daily.   hydrALAZINE 50 MG tablet Commonly known as:  APRESOLINE Take 1 tablet (50 mg total) by mouth every 8 (eight) hours.   hydroxychloroquine 200 MG tablet Commonly known as:  PLAQUENIL Take 200 mg by mouth daily.   levothyroxine 50 MCG tablet Commonly known as:  SYNTHROID, LEVOTHROID Take 50 mcg by mouth daily before breakfast.   metoprolol succinate 50 MG 24 hr tablet Commonly known as:  TOPROL-XL Take 50 mg by mouth daily. Take with or immediately following a meal.   NIFEdipine 60 MG 24 hr tablet Commonly known as:  ADALAT CC Take 1 tablet (60 mg total) by mouth daily.   omeprazole 20 MG capsule Commonly known as:  PRILOSEC Take 20 mg by mouth daily.   ondansetron 4 MG tablet Commonly known  as:  ZOFRAN Take 1 tablet (4 mg total) by mouth every 6 (six) hours as needed for nausea.   polyethylene glycol packet Commonly known as:  MIRALAX / GLYCOLAX Take 17 g by mouth 2 (two) times daily. What changed:    when to take this  reasons to take this   Potassium Chloride ER 20 MEQ Tbcr Take 20 mEq by mouth daily for 5 days. Start taking on:  July 31, 2018   predniSONE 10 MG tablet Commonly known as:  DELTASONE Take 5 mg by mouth daily with breakfast.   QUEtiapine 400 MG tablet Commonly known as:  SEROQUEL Take 1 tablet (400 mg total) by mouth at bedtime.   sodium bicarbonate 650 MG tablet Take 1,300 mg by mouth 2 (two) times daily.   vitamin B-12 1000 MCG tablet Commonly known as:  CYANOCOBALAMIN Take 1,000 mcg by  mouth daily.   ZANTAC 75 75 MG tablet Generic drug:  ranitidine Take 75 mg by mouth 2 (two) times daily as needed for indigestion.      Follow-up Information    [name], MD. Schedule an appointment as soon as possible for a visit in 1 week(s).   Specialty:  Family Medicine Contact information: [address] [place] [address] [phone]        DeterdingJeneen Rinks, MD. Schedule an appointment as soon as possible for a visit in 2 week(s).   Specialty:  Nephrology Contact information: [place]  [phone]          No Known Allergies  Consultations:  PCCM   Procedures/Studies: Dg Chest 2 View  Result Date: 07/27/2018 CLINICAL DATA:  Respiratory failure. EXAM: CHEST - 2 VIEW COMPARISON:  CT 07/26/2017. FINDINGS: Mediastinum and hilar structures normal. Cardiomegaly with diffuse bilateral pulmonary interstitial prominence and small bilateral pleural effusions. No pneumothorax. No acute bony abnormality. IMPRESSION: Cardiomegaly with diffuse bilateral pulmonary interstitial prominence and small bilateral pleural effusions again noted. Similar findings on prior exams. Again CHF and/or pneumonitis could present in this fashion. Electronically Signed   By: Marcello Moores  Register   On: 07/27/2018 07:30   Ct Head Wo Contrast  Result Date: 07/23/2018 CLINICAL DATA:  Altered mental status with decreased responsiveness EXAM: CT HEAD WITHOUT CONTRAST TECHNIQUE: Contiguous axial images were obtained from the base of the skull through the vertex without intravenous contrast. COMPARISON:  Head CT June 15, 2018 and brain MRI June 15, 2018 FINDINGS: Brain: The ventricles are normal in size and configuration. There is no intracranial mass, hemorrhage, extra-axial fluid collection, or midline shift. Brain parenchyma appears unremarkable. No acute infarct evident. There is mild basal ganglia calcification which is likely physiologic in this  age group. Vascular: No hyperdense vessel. There are foci of calcification in each carotid siphon region. Skull: Bony calvarium appears intact. Sinuses/Orbits: There is mucosal thickening in several ethmoid air cells. Other visualized paranasal sinuses are clear. Visualized orbits appear symmetric bilaterally. Other: Mastoid air cells are clear. IMPRESSION: No evident acute infarct. No mass or hemorrhage. Foci of arterial vascular calcification noted. There is mucosal thickening in several ethmoid air cells. Electronically Signed   By: Lowella Grip III M.D.   On: 07/23/2018 10:53   Ct Chest Wo Contrast  Result Date: 07/26/2018 CLINICAL DATA:  58 year old female with history of COPD exacerbation. Bilateral upper chest soreness from coughing. EXAM: CT CHEST WITHOUT CONTRAST TECHNIQUE: Multidetector CT imaging of the chest was performed following the standard protocol without IV contrast. COMPARISON:  Chest  CT 06/15/2018. FINDINGS: Cardiovascular: Heart size is borderline enlarged. Small volume of pericardial fluid and/or thickening, unlikely to be of any hemodynamic significance at this time. No associated pericardial calcification. Aortic atherosclerosis. No definite coronary artery calcifications. Mediastinum/Nodes: No definite pathologically enlarged mediastinal lymph nodes noted on today's noncontrast CT examination, although there is extensive fullness in the right hilar region which could indicate lymphadenopathy (poorly evaluated without IV contrast). Esophagus is unremarkable in appearance. No axillary lymphadenopathy. Lungs/Pleura: Trace right pleural effusion, most of which tracks in the inferior aspect of the right major fissure laterally. Extensive thickening of the peribronchovascular interstitium with surrounding ground-glass attenuation and associated interlobular septal thickening throughout the right lung, most evident in the right lower and middle lobes. Fullness in the right infrahilar  region inseparable from adjacent hilar structures. Left lung is clear. Diffuse bronchial wall thickening with moderate centrilobular and mild paraseptal emphysema. Focal architectural distortion in the anterior aspect of the right upper lobe near the apex, most compatible with an area of post infectious or inflammatory scarring. Upper Abdomen: Aortic atherosclerosis. Musculoskeletal: There are no aggressive appearing lytic or blastic lesions noted in the visualized portions of the skeleton. IMPRESSION: 1. The appearance of the chest is concerning for potential right hilar mass with associated right hilar lymphadenopathy. At this time, there is what appears to be asymmetric edema involving the right lower and middle lobes with small pleural effusion tracking in the right major fissure. Further evaluation with contrast enhanced chest CT is recommended to better evaluate these findings if there is clinical concern for malignancy. 2. Aortic atherosclerosis. 3. Diffuse bronchial wall thickening with moderate centrilobular and mild paraseptal emphysema; imaging findings suggestive of underlying COPD. Aortic Atherosclerosis (ICD10-I70.0) and Emphysema (ICD10-J43.9). Electronically Signed   By: Vinnie Langton M.D.   On: 07/26/2018 12:49   US Renal  Result Date: 07/27/2018 CLINICAL DATA:  Initial evaluation for acute renal injury EXAM: RENAL / URINARY TRACT ULTRASOUND COMPLETE COMPARISON:  Prior CT from 02/12/2018 FINDINGS: Right Kidney: Renal measurements: 10.3 x 6.1 x 6.7 cm = volume: 214.8 mL. Diffusely increased echogenicity, compatible with medical renal disease. No hydronephrosis. 7 x 6 x 10 mm simple cyst present at the interpolar region. Few small cyst present at the lower pole the right kidney, largest measuring 1.2 x 1.2 x 1.3 cm. Left Kidney: Renal measurements: 8.7 x 4.3 x 4.2 cm = volume: 82.6 mL. Diffusely increased echogenicity, compatible with medical renal disease. No hydronephrosis. No focal renal  mass. Bladder: Appears normal for degree of bladder distention. Bilateral ureteral jets visualized. IMPRESSION: 1. Diffusely increased echogenicity within the renal parenchyma, compatible with medical renal disease. 2. No hydronephrosis. 3. Simple right renal cysts as above. Electronically Signed   By: Jeannine Boga M.D.   On: 07/27/2018 00:26   Dg Chest Port 1 View  Result Date: 07/23/2018 CLINICAL DATA:  Altered level of consciousness. Dialysis. EXAM: PORTABLE CHEST 1 VIEW COMPARISON:  Chest radiograph and CT 06/15/2018 FINDINGS: The patient is rotated to the right. The cardiac silhouette remains enlarged. Diffuse interstitial densities are unchanged from the prior radiograph though increased from older radiographs of 02/02/2018. No sizable pleural effusion or pneumothorax is identified. IMPRESSION: Cardiomegaly with unchanged interstitial densities which could reflect mild edema or atypical infection superimposed on chronic lung disease. Electronically Signed   By: Logan Bores M.D.   On: 07/23/2018 10:17       Subjective: Patient seen and examined the bedside this morning.  Remains comfortable.  Saturating fine on room air.  Respiratory status stable.  Mildly hypertensive this morning.  Stable for discharge to skilled nursing facility.  Discharge Exam: Vitals:   07/30/18 0354 07/30/18 0730  BP: (!) 151/82 (!) 164/101  Pulse: 70 66  Resp: 18 18  Temp: 98.7 F (37.1 C) 99.1 F (37.3 C)  SpO2: 94% 100%   Vitals:   07/29/18 2045 07/29/18 2328 07/30/18 0354 07/30/18 0730  BP:  (!) 147/81 (!) 151/82 (!) 164/101  Pulse:  66 70 66  Resp:  18 18 18   Temp:  98.1 F (36.7 C) 98.7 F (37.1 C) 99.1 F (37.3 C)  TempSrc:  Oral Oral Oral  SpO2: 95% 96% 94% 100%  Weight:      Height:        General: Pt is alert, awake, not in acute distress Cardiovascular: RRR, S1/S2 +, no rubs, no gallops Respiratory: CTA bilaterally, no wheezing, no rhonchi Abdominal: Soft, NT, ND, bowel sounds  + Extremities: no edema, no cyanosis    The results of significant diagnostics from this hospitalization (including imaging, microbiology, ancillary and laboratory) are listed below for reference.     Microbiology: Recent Results (from the past 240 hour(s))  Urine culture     Status: Abnormal   Collection Time: 07/23/18  1:42 PM  Result Value Ref Range Status   Specimen Description URINE, CLEAN CATCH  Final   Special Requests   Final    NONE Performed at [facility], [address] [place], [place] [address]    Culture MULTIPLE SPECIES PRESENT, SUGGEST RECOLLECTION (A)  Final   Report Status 07/24/2018 FINAL  Final  Culture, blood (routine x 2)     Status: None   Collection Time: 07/23/18  7:12 PM  Result Value Ref Range Status   Specimen Description BLOOD LEFT HAND  Final   Special Requests   Final    BOTTLES DRAWN AEROBIC ONLY Blood Culture adequate volume   Culture   Final    NO GROWTH 5 DAYS Performed at [facility], [place] [address] [place], [place] [address]    Report Status 07/28/2018 FINAL  Final  Culture, blood (routine x 2)     Status: None   Collection Time: 07/23/18  7:12 PM  Result Value Ref Range Status   Specimen Description BLOOD LEFT HAND  Final   Special Requests   Final    BOTTLES DRAWN AEROBIC ONLY Blood Culture adequate volume   Culture   Final    NO GROWTH 5 DAYS Performed at [facility], [place] [address] [place], [place] [address]    Report Status 07/28/2018 FINAL  Final  Expectorated sputum assessment w rflx to resp cult     Status: None   Collection Time: 07/26/18  3:31 PM  Result Value Ref Range Status   Specimen Description SPUTUM  Final   Special Requests NONE  Final   Sputum evaluation   Final    THIS SPECIMEN IS ACCEPTABLE FOR SPUTUM CULTURE Performed at [facility], [place] [address], [place], [place] [address]    Report Status 07/29/2018 FINAL  Final  Culture, respiratory     Status: None (Preliminary result)    Collection Time: 07/26/18  3:31 PM  Result Value Ref Range Status   Specimen Description SPUTUM  Final   Special Requests NONE Reflexed from [id number]  Final   Gram Stain   Final    FEW WBC PRESENT, PREDOMINANTLY MONONUCLEAR RARE SQUAMOUS EPITHELIAL CELLS PRESENT RARE Lonell Grandchild  POSITIVE COCCI IN PAIRS    Culture   Final    CULTURE REINCUBATED FOR BETTER GROWTH Performed at [facility], [place] [address], [place], [place] [address]    Report Status PENDING  Incomplete  MRSA PCR Screening     Status: None   Collection Time: 07/26/18  4:25 PM  Result Value Ref Range Status   MRSA by PCR NEGATIVE NEGATIVE Final    Comment:        The GeneXpert MRSA Assay (FDA approved for NASAL specimens only), is one component of a comprehensive MRSA colonization surveillance program. It is not intended to diagnose MRSA infection nor to guide or monitor treatment for MRSA infections. Performed at [facility], [place] [address] [place], [place] [address]      Labs: BNP (last 3 results) Recent Labs    06/15/18 2048  BNP 30.0   Basic Metabolic Panel: Recent Labs  Lab 07/25/18 0637 07/27/18 0544 07/28/18 0751 07/29/18 0533 07/30/18 0705  NA 142 139 140 140 140  K 4.1 4.5 4.1 3.7 3.3*  CL 111 106 105 102 103  CO2 19* 23 24 29 27   GLUCOSE 106* 140* 125* 93 94  BUN 24* 24* 29* 28* 26*  CREATININE 1.88* 2.04* 2.08* 2.13* 1.92*  CALCIUM 8.8* 8.6* 8.9 8.4* 8.8*   Liver Function Tests: No results for input(s): AST, ALT, ALKPHOS, BILITOT, PROT, ALBUMIN in the last 168 hours. No results for input(s): LIPASE, AMYLASE in the last 168 hours. Recent Labs  Lab 07/23/18 1545  AMMONIA 20   CBC: Recent Labs  Lab 07/24/18 1005 07/25/18 0637 07/27/18 0544 07/28/18 0751 07/29/18 0533  WBC 4.5 5.1 3.2* 8.6 6.6  NEUTROABS  --  3.3 2.5 6.6 3.6  HGB 8.5* 7.9* 7.9* 8.7* 7.9*  HCT 22.2* 21.2* 22.9* 25.8* 22.0*  MCV 118.7* 117.8* 118.7* 118.3* 118.9*  PLT 378 349 328 411* 308    Cardiac Enzymes: No results for input(s): CKTOTAL, CKMB, CKMBINDEX, TROPONINI in the last 168 hours. BNP: Invalid input(s): POCBNP CBG: No results for input(s): GLUCAP in the last 168 hours. D-Dimer No results for input(s): DDIMER in the last 72 hours. Hgb A1c No results for input(s): HGBA1C in the last 72 hours. Lipid Profile No results for input(s): CHOL, HDL, LDLCALC, TRIG, CHOLHDL, LDLDIRECT in the last 72 hours. Thyroid function studies No results for input(s): TSH, T4TOTAL, T3FREE, THYROIDAB in the last 72 hours.  Invalid input(s): FREET3 Anemia work up No results for input(s): VITAMINB12, FOLATE, FERRITIN, TIBC, IRON, RETICCTPCT in the last 72 hours. Urinalysis    Component Value Date/Time   COLORURINE STRAW (A) 07/28/2018 1859   APPEARANCEUR CLEAR 07/28/2018 1859   LABSPEC 1.008 07/28/2018 1859   PHURINE 6.0 07/28/2018 1859   GLUCOSEU NEGATIVE 07/28/2018 1859   HGBUR NEGATIVE 07/28/2018 1859   BILIRUBINUR NEGATIVE 07/28/2018 1859   BILIRUBINUR n 07/20/2013 0948   KETONESUR NEGATIVE 07/28/2018 1859   PROTEINUR NEGATIVE 07/28/2018 1859   UROBILINOGEN 0.2 05/11/2015 2201   NITRITE NEGATIVE 07/28/2018 1859   LEUKOCYTESUR NEGATIVE 07/28/2018 1859   Sepsis Labs Invalid input(s): PROCALCITONIN,  WBC,  LACTICIDVEN Microbiology Recent Results (from the past 240 hour(s))  Urine culture     Status: Abnormal   Collection Time: 07/23/18  1:42 PM  Result Value Ref Range Status   Specimen Description URINE, CLEAN CATCH  Final   Special Requests   Final    NONE Performed at [facility], [place] [address], [place], [place]  [address]    Culture MULTIPLE SPECIES PRESENT, SUGGEST RECOLLECTION (A)  Final   Report Status 07/24/2018 FINAL  Final  Culture, blood (routine x 2)     Status: None   Collection Time: 07/23/18  7:12 PM  Result Value Ref Range Status   Specimen Description BLOOD LEFT HAND  Final   Special Requests   Final    BOTTLES DRAWN AEROBIC ONLY Blood  Culture adequate volume   Culture   Final    NO GROWTH 5 DAYS Performed at [facility], [place] [address], [place], [place] [address]    Report Status 07/28/2018 FINAL  Final  Culture, blood (routine x 2)     Status: None   Collection Time: 07/23/18  7:12 PM  Result Value Ref Range Status   Specimen Description BLOOD LEFT HAND  Final   Special Requests   Final    BOTTLES DRAWN AEROBIC ONLY Blood Culture adequate volume   Culture   Final    NO GROWTH 5 DAYS Performed at [facility], [place] [address], [place], [place] [address]    Report Status 07/28/2018 FINAL  Final  Expectorated sputum assessment w rflx to resp cult     Status: None   Collection Time: 07/26/18  3:31 PM  Result Value Ref Range Status   Specimen Description SPUTUM  Final   Special Requests NONE  Final   Sputum evaluation   Final    THIS SPECIMEN IS ACCEPTABLE FOR SPUTUM CULTURE Performed at [facility], [place] [address] [place], [place] [address]    Report Status 07/29/2018 FINAL  Final  Culture, respiratory     Status: None (Preliminary result)   Collection Time: 07/26/18  3:31 PM  Result Value Ref Range Status   Specimen Description SPUTUM  Final   Special Requests NONE Reflexed from [id number]  Final   Gram Stain   Final    FEW WBC PRESENT, PREDOMINANTLY MONONUCLEAR RARE SQUAMOUS EPITHELIAL CELLS PRESENT RARE GRAM POSITIVE COCCI IN PAIRS    Culture   Final    CULTURE REINCUBATED FOR BETTER GROWTH Performed at [facility], [place] [address], [place], [place] [address]    Report Status PENDING  Incomplete  MRSA PCR Screening     Status: None   Collection Time: 07/26/18  4:25 PM  Result Value Ref Range Status   MRSA by PCR NEGATIVE NEGATIVE Final    Comment:        The GeneXpert MRSA Assay (FDA approved for NASAL specimens only), is one component of a comprehensive MRSA colonization surveillance program. It is not intended to diagnose MRSA infection nor to guide or monitor treatment  for MRSA infections. Performed at [facility], [place] [address], [place], [place] [address]     Please note: You were cared for by a hospitalist during your hospital stay. Once you are discharged, your primary care physician will handle any further medical issues. Please note that NO REFILLS for any discharge medications will be authorized once you are discharged, as it is imperative that you return to your primary care physician (or establish a relationship with a primary care physician if you do not have one) for your post hospital discharge needs so that they can reassess your need for medications and monitor your lab values.    Time coordinating discharge: 40 minutes  SIGNED:   Shelly Coss, MD  [facility] 07/30/2018, 11:56 AM Pager [phone]  If 7PM-7AM, please contact  night-coverage [email or web address] Password [email or web address]

## 2018-07-30 NOTE — Progress Notes (Signed)
CSW spoke with patient's husband to discuss bed offers, as patient has episodes of altered mental status and memory deficits. Patient's husband has selected [facility] for SNF. CSW confirmed bed availability and asked Admissions to start insurance authorization.  CSW to continue to follow.  Laveda Abbe, [place] Clinical Social Worker [phone]

## 2018-07-31 NOTE — Progress Notes (Signed)
  CSW spoke with Santiago Glad, admissions director with [facility]. We are still awaiting on insurance authorization.   CSW will continue to follow.   Domenic Schwab, MSW, [place]

## 2018-07-31 NOTE — Progress Notes (Addendum)
Seen and examined 58 yo with hx CVA, SLE, HLD, hypothyroidism, HTN, bipolar, HF who presented with AMS.  Thought possibly related to benzos.  She now appears at baseline.  On abx and steroids for possible pna/copd. No concerns today.  NAD RRR, no mgr CTAB S/NT/ND No lee  Currently awaiting SNF placement. See d/c summary for additional details

## 2018-08-01 LAB — GLUCOSE, CAPILLARY: Glucose-Capillary: 108 mg/dL — ABNORMAL HIGH (ref 70–99)

## 2018-08-01 LAB — CULTURE, RESPIRATORY

## 2018-08-01 LAB — CULTURE, RESPIRATORY W GRAM STAIN: Culture: NORMAL

## 2018-08-01 MED ORDER — PREDNISONE 20 MG PO TABS
20.0000 mg | ORAL_TABLET | Freq: Every day | ORAL | Status: DC
  Administered 2018-08-02: 20 mg via ORAL
  Filled 2018-08-01: qty 1

## 2018-08-01 MED ORDER — NIFEDIPINE ER OSMOTIC RELEASE 30 MG PO TB24
30.0000 mg | ORAL_TABLET | Freq: Once | ORAL | Status: AC
  Administered 2018-08-01: 30 mg via ORAL
  Filled 2018-08-01: qty 1

## 2018-08-01 MED ORDER — NIFEDIPINE ER OSMOTIC RELEASE 90 MG PO TB24
90.0000 mg | ORAL_TABLET | Freq: Every day | ORAL | Status: DC
  Administered 2018-08-02: 90 mg via ORAL
  Filled 2018-08-01: qty 1

## 2018-08-01 NOTE — Progress Notes (Signed)
Patient BP 179/86. Pulse 114. Dr. Sloan Leiter notified. Nurse was instructed to give 30mg  more of Nifedipine to total 90mg  for the day. Nurse will continue to monitor. [place]

## 2018-08-01 NOTE — Progress Notes (Signed)
PROGRESS NOTE        PATIENT DETAILS Name: Anna [name] Age: 58 y.o. Sex: female Date of Birth: 1961/01/26 Admit Date: 07/23/2018 Admitting Physician Karmen Bongo, MD XBM:[id number], Bryn Gulling, MD  Brief Narrative: Patient is a 58 y.o. female with history of lupus on immunosuppressive's, CVA, dyslipidemia, hypothyroidism, hypertension, bipolar disorder-presented to the hospital for evaluation of altered mental status-she was thought to have acute encephalopathy-multifactorial in etiology-secondary to hypoxia, AKI and also from use of benzodiazepine.  Improved with supportive care-awaiting SNF bed on discharge.  Subjective: No major issues overnight-awake and alert.  Assessment/Plan: Acute encephalopathy: Thought to be multifactorial-likely metabolic (hypoxia, AKI) and toxic (benzodiazepine use).  She is markedly improved with supportive care.  Acute hypoxic respiratory failure: Thought to be multifactorial-secondary to COPD exacerbation, probably atypical pneumonitis-improved with bronchodilators, steroids and empiric antimicrobial therapy with Augmentin (stop date of 1/27).  She is now on room air.  COPD exacerbation: Resolved-lungs are clear to auscultation-continue bronchodilators-and 5 mg of prednisone (chronic dose)  AKI on CKD stage III: AKI felt to be hemodynamically mediated-prior MD-discussed with nephrology-not felt to have lupus nephritis (complement levels within normal limit, dsDNA not significantly elevated).  No evidence of hydronephrosis on renal ultrasound high suspicion for hemodynamic mediated kidney injury-creatinine is slowly improving and trending back to usual baseline.  Repeat electrolytes as outpatient.    Macrocytic anemia: Multifactorial-suspect has anemia secondary to chronic disease (lupus, CKD) vitamin B12 within normal limits.  Hemoglobin slightly lower than usual baseline but patient asymptomatic-suspect worsened by acute illness.   Repeat CBC in 1 week.  Chronic diastolic heart failure: Euvolemic-Aldactone remains on hold  SLE: Continue Imuran, Plaquenil and prednisone.  Hypertension: Pressure creeping up-stop IV fluids-continue nifedipine but increased to 90 mg daily, continue metoprolol and follow.  Hypothyroidism: Continue Synthroid  Bipolar disorder: Appears to be stable-she is on chronic benzodiazepines for the past few years-agree with reducing dose of Valium 1 discharge.  Continue Seroquel.  DVT Prophylaxis: SCD's  Code Status: Full code   Family Communication: None at bedside  Disposition Plan: Remain inpatient- SNF on discharge-awaiting SNF bed  Antimicrobial agents: Anti-infectives (From admission, onward)   Start     Dose/Rate Route Frequency Ordered Stop   07/30/18 0000  amoxicillin-clavulanate (AUGMENTIN) 875-125 MG tablet     1 tablet Oral Every 12 hours 07/30/18 1156     07/29/18 1200  amoxicillin-clavulanate (AUGMENTIN) 875-125 MG per tablet 1 tablet     1 tablet Oral Every 12 hours 07/29/18 1130 08/02/18 0959   07/26/18 1800  vancomycin (VANCOCIN) IVPB 1000 mg/200 mL premix  Status:  Discontinued     1,000 mg 200 mL/hr over 60 Minutes Intravenous Every 24 hours 07/26/18 1635 07/27/18 1446   07/26/18 1700  piperacillin-tazobactam (ZOSYN) IVPB 3.375 g  Status:  Discontinued     3.375 g 12.5 mL/hr over 240 Minutes Intravenous Every 8 hours 07/26/18 1635 07/29/18 0854   07/26/18 1030  doxycycline (VIBRA-TABS) tablet 100 mg  Status:  Discontinued     100 mg Oral Every 12 hours 07/26/18 1027 07/26/18 1543   07/24/18 1000  hydroxychloroquine (PLAQUENIL) tablet 200 mg     200 mg Oral Daily 07/23/18 1601        Procedures: None  CONSULTS:  None  Time spent: 25- minutes-Greater than 50% of this time was spent in counseling, explanation of diagnosis, planning  of further management, and coordination of care.  MEDICATIONS: Scheduled Meds: . amoxicillin-clavulanate  1 tablet Oral Q12H    . aspirin EC  81 mg Oral Daily  . atorvastatin  20 mg Oral Daily  . azaTHIOprine  50 mg Oral BID  . calcitRIOL  0.25 mcg Oral Daily  . docusate sodium  100 mg Oral BID  . escitalopram  30 mg Oral Daily  . folic acid  1 mg Oral Daily  . guaiFENesin  600 mg Oral BID  . hydrALAZINE  50 mg Oral Q8H  . hydroxychloroquine  200 mg Oral Daily  . levothyroxine  50 mcg Oral Q0600  . metoprolol succinate  50 mg Oral Daily  . NIFEdipine  60 mg Oral Daily  . pantoprazole  40 mg Oral Daily  . polyethylene glycol  17 g Oral BID  . predniSONE  40 mg Oral Q breakfast  . sodium bicarbonate  1,300 mg Oral BID  . vitamin B-12  1,000 mcg Oral Daily   Continuous Infusions: . sodium chloride 250 mL (07/27/18 2125)  . lactated ringers 50 mL/hr at 07/29/18 1416   PRN Meds:.sodium chloride, acetaminophen **OR** acetaminophen, ALPRAZolam, benzonatate, ipratropium-albuterol, morphine injection, ondansetron **OR** ondansetron (ZOFRAN) IV   PHYSICAL EXAM: Vital signs: Vitals:   07/31/18 1950 07/31/18 2331 08/01/18 0358 08/01/18 0812  BP: (!) 166/89 133/87 140/86 (!) 182/88  Pulse: 78 74 72 66  Resp: 18 20 20 20   Temp: 98.5 F (36.9 C) 98.4 F (36.9 C) 98.3 F (36.8 C) 98.5 F (36.9 C)  TempSrc: Oral Oral Oral Oral  SpO2: 95% 92% (!) 89% 99%  Weight:      Height:       Filed Weights   07/24/18 0611  Weight: 80 kg   Body mass index is 27.62 kg/m.   General appearance :Awake, alert, not in any distress. Speech Clear.  HEENT: Atraumatic and Normocephalic Neck: supple Resp:Good air entry bilaterally, no added sounds  CVS: S1 S2 regular, no murmurs.  GI: Bowel sounds present, Non tender and not distended with no gaurding, rigidity or rebound.No organomegaly Extremities: B/L Lower Ext shows no edema, both legs are warm to touch Neurology: Nonfocal Musculoskeletal:No digital cyanosis Skin:No Rash, warm and dry Wounds:N/A  I have personally reviewed following labs and imaging  studies  LABORATORY DATA: CBC: Recent Labs  Lab 07/27/18 0544 07/28/18 0751 07/29/18 0533  WBC 3.2* 8.6 6.6  NEUTROABS 2.5 6.6 3.6  HGB 7.9* 8.7* 7.9*  HCT 22.9* 25.8* 22.0*  MCV 118.7* 118.3* 118.9*  PLT 328 411* 569    Basic Metabolic Panel: Recent Labs  Lab 07/27/18 0544 07/28/18 0751 07/29/18 0533 07/30/18 0705  NA 139 140 140 140  K 4.5 4.1 3.7 3.3*  CL 106 105 102 103  CO2 23 24 29 27   GLUCOSE 140* 125* 93 94  BUN 24* 29* 28* 26*  CREATININE 2.04* 2.08* 2.13* 1.92*  CALCIUM 8.6* 8.9 8.4* 8.8*    GFR: Estimated Creatinine Clearance: 34.4 mL/min (A) (by C-G formula based on SCr of 1.92 mg/dL (H)).  Liver Function Tests: No results for input(s): AST, ALT, ALKPHOS, BILITOT, PROT, ALBUMIN in the last 168 hours. No results for input(s): LIPASE, AMYLASE in the last 168 hours. No results for input(s): AMMONIA in the last 168 hours.  Coagulation Profile: No results for input(s): INR, PROTIME in the last 168 hours.  Cardiac Enzymes: No results for input(s): CKTOTAL, CKMB, CKMBINDEX, TROPONINI in the last 168 hours.  BNP (last 3 results)  No results for input(s): PROBNP in the last 8760 hours.  HbA1C: No results for input(s): HGBA1C in the last 72 hours.  CBG: Recent Labs  Lab 08/01/18 0634  GLUCAP 108*    Lipid Profile: No results for input(s): CHOL, HDL, LDLCALC, TRIG, CHOLHDL, LDLDIRECT in the last 72 hours.  Thyroid Function Tests: No results for input(s): TSH, T4TOTAL, FREET4, T3FREE, THYROIDAB in the last 72 hours.  Anemia Panel: No results for input(s): VITAMINB12, FOLATE, FERRITIN, TIBC, IRON, RETICCTPCT in the last 72 hours.  Urine analysis:    Component Value Date/Time   COLORURINE STRAW (A) 07/28/2018 1859   APPEARANCEUR CLEAR 07/28/2018 1859   LABSPEC 1.008 07/28/2018 1859   PHURINE 6.0 07/28/2018 1859   GLUCOSEU NEGATIVE 07/28/2018 1859   HGBUR NEGATIVE 07/28/2018 1859   BILIRUBINUR NEGATIVE 07/28/2018 1859   BILIRUBINUR n  07/20/2013 0948   KETONESUR NEGATIVE 07/28/2018 1859   PROTEINUR NEGATIVE 07/28/2018 1859   UROBILINOGEN 0.2 05/11/2015 2201   NITRITE NEGATIVE 07/28/2018 1859   LEUKOCYTESUR NEGATIVE 07/28/2018 1859    Sepsis Labs: Lactic Acid, Venous    Component Value Date/Time   LATICACIDVEN 0.76 07/23/2018 1033    MICROBIOLOGY: Recent Results (from the past 240 hour(s))  Urine culture     Status: Abnormal   Collection Time: 07/23/18  1:42 PM  Result Value Ref Range Status   Specimen Description URINE, CLEAN CATCH  Final   Special Requests   Final    NONE Performed at [facility], [place] [address], [place], [place] [address]    Culture MULTIPLE SPECIES PRESENT, SUGGEST RECOLLECTION (A)  Final   Report Status 07/24/2018 FINAL  Final  Culture, blood (routine x 2)     Status: None   Collection Time: 07/23/18  7:12 PM  Result Value Ref Range Status   Specimen Description BLOOD LEFT HAND  Final   Special Requests   Final    BOTTLES DRAWN AEROBIC ONLY Blood Culture adequate volume   Culture   Final    NO GROWTH 5 DAYS Performed at [facility], [place] [address], [place], [place] [address]    Report Status 07/28/2018 FINAL  Final  Culture, blood (routine x 2)     Status: None   Collection Time: 07/23/18  7:12 PM  Result Value Ref Range Status   Specimen Description BLOOD LEFT HAND  Final   Special Requests   Final    BOTTLES DRAWN AEROBIC ONLY Blood Culture adequate volume   Culture   Final    NO GROWTH 5 DAYS Performed at [facility], [place] [address], [place], [place] [address]    Report Status 07/28/2018 FINAL  Final  Expectorated sputum assessment w rflx to resp cult     Status: None   Collection Time: 07/26/18  3:31 PM  Result Value Ref Range Status   Specimen Description SPUTUM  Final   Special Requests NONE  Final   Sputum evaluation   Final    THIS SPECIMEN IS ACCEPTABLE FOR SPUTUM CULTURE Performed at [facility], [place] [address], [place], [place]  [address]    Report Status 07/29/2018 FINAL  Final  Culture, respiratory     Status: None (Preliminary result)   Collection Time: 07/26/18  3:31 PM  Result Value Ref Range Status   Specimen Description SPUTUM  Final   Special Requests NONE Reflexed from [id number]  Final   Gram Stain   Final    FEW WBC PRESENT, PREDOMINANTLY  MONONUCLEAR RARE SQUAMOUS EPITHELIAL CELLS PRESENT RARE GRAM POSITIVE COCCI IN PAIRS Performed at [facility], [place] [address], [place], [place] [address]    Culture FEW Consistent with normal respiratory flora.  Final   Report Status PENDING  Incomplete  MRSA PCR Screening     Status: None   Collection Time: 07/26/18  4:25 PM  Result Value Ref Range Status   MRSA by PCR NEGATIVE NEGATIVE Final    Comment:        The GeneXpert MRSA Assay (FDA approved for NASAL specimens only), is one component of a comprehensive MRSA colonization surveillance program. It is not intended to diagnose MRSA infection nor to guide or monitor treatment for MRSA infections. Performed at [facility], [place] [address], [place], [place] [address]     RADIOLOGY STUDIES/RESULTS: Dg Chest 2 View  Result Date: 07/27/2018 CLINICAL DATA:  Respiratory failure. EXAM: CHEST - 2 VIEW COMPARISON:  CT 07/26/2017. FINDINGS: Mediastinum and hilar structures normal. Cardiomegaly with diffuse bilateral pulmonary interstitial prominence and small bilateral pleural effusions. No pneumothorax. No acute bony abnormality. IMPRESSION: Cardiomegaly with diffuse bilateral pulmonary interstitial prominence and small bilateral pleural effusions again noted. Similar findings on prior exams. Again CHF and/or pneumonitis could present in this fashion. Electronically Signed   By: Marcello Moores  Register   On: 07/27/2018 07:30   Ct Head Wo Contrast  Result Date: 07/23/2018 CLINICAL DATA:  Altered mental status with decreased responsiveness EXAM: CT HEAD WITHOUT CONTRAST TECHNIQUE: Contiguous axial images were  obtained from the base of the skull through the vertex without intravenous contrast. COMPARISON:  Head CT June 15, 2018 and brain MRI June 15, 2018 FINDINGS: Brain: The ventricles are normal in size and configuration. There is no intracranial mass, hemorrhage, extra-axial fluid collection, or midline shift. Brain parenchyma appears unremarkable. No acute infarct evident. There is mild basal ganglia calcification which is likely physiologic in this age group. Vascular: No hyperdense vessel. There are foci of calcification in each carotid siphon region. Skull: Bony calvarium appears intact. Sinuses/Orbits: There is mucosal thickening in several ethmoid air cells. Other visualized paranasal sinuses are clear. Visualized orbits appear symmetric bilaterally. Other: Mastoid air cells are clear. IMPRESSION: No evident acute infarct. No mass or hemorrhage. Foci of arterial vascular calcification noted. There is mucosal thickening in several ethmoid air cells. Electronically Signed   By: Lowella Grip III M.D.   On: 07/23/2018 10:53   Ct Chest Wo Contrast  Result Date: 07/26/2018 CLINICAL DATA:  58 year old female with history of COPD exacerbation. Bilateral upper chest soreness from coughing. EXAM: CT CHEST WITHOUT CONTRAST TECHNIQUE: Multidetector CT imaging of the chest was performed following the standard protocol without IV contrast. COMPARISON:  Chest CT 06/15/2018. FINDINGS: Cardiovascular: Heart size is borderline enlarged. Small volume of pericardial fluid and/or thickening, unlikely to be of any hemodynamic significance at this time. No associated pericardial calcification. Aortic atherosclerosis. No definite coronary artery calcifications. Mediastinum/Nodes: No definite pathologically enlarged mediastinal lymph nodes noted on today's noncontrast CT examination, although there is extensive fullness in the right hilar region which could indicate lymphadenopathy (poorly evaluated without IV contrast).  Esophagus is unremarkable in appearance. No axillary lymphadenopathy. Lungs/Pleura: Trace right pleural effusion, most of which tracks in the inferior aspect of the right major fissure laterally. Extensive thickening of the peribronchovascular interstitium with surrounding ground-glass attenuation and associated interlobular septal thickening throughout the right lung, most evident in the right lower and middle lobes. Fullness in the right infrahilar region inseparable from  adjacent hilar structures. Left lung is clear. Diffuse bronchial wall thickening with moderate centrilobular and mild paraseptal emphysema. Focal architectural distortion in the anterior aspect of the right upper lobe near the apex, most compatible with an area of post infectious or inflammatory scarring. Upper Abdomen: Aortic atherosclerosis. Musculoskeletal: There are no aggressive appearing lytic or blastic lesions noted in the visualized portions of the skeleton. IMPRESSION: 1. The appearance of the chest is concerning for potential right hilar mass with associated right hilar lymphadenopathy. At this time, there is what appears to be asymmetric edema involving the right lower and middle lobes with small pleural effusion tracking in the right major fissure. Further evaluation with contrast enhanced chest CT is recommended to better evaluate these findings if there is clinical concern for malignancy. 2. Aortic atherosclerosis. 3. Diffuse bronchial wall thickening with moderate centrilobular and mild paraseptal emphysema; imaging findings suggestive of underlying COPD. Aortic Atherosclerosis (ICD10-I70.0) and Emphysema (ICD10-J43.9). Electronically Signed   By: Vinnie Langton M.D.   On: 07/26/2018 12:49   US Renal  Result Date: 07/27/2018 CLINICAL DATA:  Initial evaluation for acute renal injury EXAM: RENAL / URINARY TRACT ULTRASOUND COMPLETE COMPARISON:  Prior CT from 02/12/2018 FINDINGS: Right Kidney: Renal measurements: 10.3 x 6.1 x  6.7 cm = volume: 214.8 mL. Diffusely increased echogenicity, compatible with medical renal disease. No hydronephrosis. 7 x 6 x 10 mm simple cyst present at the interpolar region. Few small cyst present at the lower pole the right kidney, largest measuring 1.2 x 1.2 x 1.3 cm. Left Kidney: Renal measurements: 8.7 x 4.3 x 4.2 cm = volume: 82.6 mL. Diffusely increased echogenicity, compatible with medical renal disease. No hydronephrosis. No focal renal mass. Bladder: Appears normal for degree of bladder distention. Bilateral ureteral jets visualized. IMPRESSION: 1. Diffusely increased echogenicity within the renal parenchyma, compatible with medical renal disease. 2. No hydronephrosis. 3. Simple right renal cysts as above. Electronically Signed   By: Jeannine Boga M.D.   On: 07/27/2018 00:26   Dg Chest Port 1 View  Result Date: 07/23/2018 CLINICAL DATA:  Altered level of consciousness. Dialysis. EXAM: PORTABLE CHEST 1 VIEW COMPARISON:  Chest radiograph and CT 06/15/2018 FINDINGS: The patient is rotated to the right. The cardiac silhouette remains enlarged. Diffuse interstitial densities are unchanged from the prior radiograph though increased from older radiographs of 02/02/2018. No sizable pleural effusion or pneumothorax is identified. IMPRESSION: Cardiomegaly with unchanged interstitial densities which could reflect mild edema or atypical infection superimposed on chronic lung disease. Electronically Signed   By: Logan Bores M.D.   On: 07/23/2018 10:17     LOS: 8 days   Oren Binet, MD  [facility]  If 7PM-7AM, please contact night-coverage  Please page via [email or web address] [email or web address]-click on MD name and type text message  08/01/2018, 10:56 AM

## 2018-08-01 NOTE — Progress Notes (Signed)
Patient is very tearful and anxious. She states she feels she will never be able to leave. Nurse educated her that she is medically ready to leave, but we are waiting on insurance. Nurse gave her PRN Xanax. Will continue to monitor. [place]

## 2018-08-02 MED ORDER — NIFEDIPINE ER 90 MG PO TB24: 90.0000 mg | ORAL_TABLET | Freq: Every day | ORAL | Status: DC

## 2018-08-02 MED ORDER — PREDNISONE 10 MG PO TABS: ORAL_TABLET | ORAL | Status: DC

## 2018-08-02 NOTE — Clinical Social Work Placement (Signed)
Nurse to call report to [phone], Room 101A     CLINICAL SOCIAL WORK PLACEMENT  NOTE  Date:  08/02/2018  Patient Details  Name: Anna [name] MRN: [id number] Date of Birth: [birth year]  Clinical Social Work is seeking post-discharge placement for this patient at the [place] level of care (*CSW will initial, date and re-position this form in  chart as items are completed):  Yes   Patient/family provided with [place] Work Department's list of facilities offering this level of care within the geographic area requested by the patient (or if unable, by the patient's family).  Yes   Patient/family informed of their freedom to choose among providers that offer the needed level of care, that participate in Medicare, Medicaid or managed care program needed by the patient, have an available bed and are willing to accept the patient.  Yes   Patient/family informed of [facility]'s ownership interest in [facility] and [facility], as well as of the fact that they are under no obligation to receive care at these facilities.  PASRR submitted to EDS on       PASRR number received on       Existing PASRR number confirmed on 07/28/18     FL2 transmitted to all facilities in geographic area requested by pt/family on 07/28/18     FL2 transmitted to all facilities within larger geographic area on       Patient informed that his/her managed care company has contracts with or will negotiate with certain facilities, including the following:        Yes   Patient/family informed of bed offers received.  Patient chooses bed at [facility]     Physician recommends and patient chooses bed at      Patient to be transferred to [facility] on 08/02/18.  Patient to be transferred to facility by [facility]     Patient family notified on 08/02/18 of transfer.  Name of family member notified:  Self, husband     PHYSICIAN       Additional  Comment:    _______________________________________________ Geralynn Ochs, LCSW 08/02/2018, 11:22 AM

## 2018-08-02 NOTE — Progress Notes (Signed)
Patient discharging to [facility] All belongings sent with patient. Discharge paper work sent with [facility] Nurse will call report.

## 2018-08-02 NOTE — Progress Notes (Signed)
Nurse attempted to call report. I was on hold for sometime and nurse never picked up. I left a call back number if she has questions.

## 2018-08-02 NOTE — Progress Notes (Signed)
CSW received authorization for patient to discharge to SNF. Messaged MD to ensure patient is still medically stable for discharge.  CSW to follow for hopeful discharge today to SNF.  Laveda Abbe, [place] Clinical Social Worker [phone]

## 2018-08-02 NOTE — Discharge Summary (Signed)
PATIENT DETAILS Name: Anna [name] Age: 58 y.o. Sex: female Date of Birth: [birth year] MRN: [id number]. Admitting Physician: Karmen Bongo, MD [id number], Bryn Gulling, MD  Admit Date: 07/23/2018 Discharge date: 08/02/2018  Recommendations for Outpatient Follow-up:  1. Follow up with PCP in 1-2 weeks 2. Please obtain BMP/CBC in one week 3. Ensure follow-up with nephrology and rheumatology  4. PCP to consider tapering off benzodiazepine in the outpatient setting 5. PCP to repeat CT chest in a few weeks-hopefully with IV contrast-to reassess right hilar area (see CT scan report on 1/20)   Admitted From:  Home   Disposition: SNF   Home Health: No  Equipment/Devices: None  Discharge Condition: Stable  CODE STATUS: FULL CODE  Diet recommendation:  Heart Healthy  Brief Summary: See H&P, Labs, Consult and Test reports for all details in brief, Patient is a 58 y.o. female with history of lupus on immunosuppressive's, CVA, dyslipidemia, hypothyroidism, hypertension, bipolar disorder-presented to the hospital for evaluation of altered mental status-she was thought to have acute encephalopathy-multifactorial in etiology-secondary to hypoxia, AKI and also from use of benzodiazepine.  Improved with supportive care-awaiting SNF bed on discharge.  Brief Hospital Course: Acute encephalopathy: Thought to be multifactorial-likely metabolic (hypoxia, AKI) and toxic (benzodiazepine use).  She is markedly improved with supportive care-AKI has markedly improved-hypoxia has resolved-Valium dosage has been reduced to half of home dose on discharge.  Acute hypoxic respiratory failure: Thought to be multifactorial-secondary to COPD exacerbation, probably atypical pneumonitis-improved with bronchodilators, steroids and empiric antimicrobial therapy with Augmentin (stop date of 1/27).  She is now on room air.  COPD exacerbation: Resolved-lungs are clear to auscultation-continue  bronchodilators-continue tapering prednisone until she reaches her usual home regimen of 5 mg daily.   AKI on CKD stage III: AKI felt to be hemodynamically mediated-prior MD (Dr Adhikari)-discussed with nephrology-not felt to have lupus nephritis (complement levels within normal limit, dsDNA not significantly elevated).  No evidence of hydronephrosis on renal ultrasound high suspicion for hemodynamic mediated kidney injury-creatinine is slowly improving and trending back to usual baseline.  Repeat electrolytes as outpatient.    Macrocytic anemia: Multifactorial-suspect has anemia secondary to chronic disease (lupus, CKD) vitamin B12 within normal limits.  Hemoglobin slightly lower than usual baseline but patient asymptomatic-suspect worsened by acute illness.  Repeat CBC in 1 week.  Chronic diastolic heart failure: Euvolemic-Aldactone remains on hold-resume when able  SLE: Continue Imuran, Plaquenil and prednisone.  Hypertension:  Blood pressure somewhat improved-nifedipine has been increased to 90 mg daily-continue metoprolol-further optimization deferred to attending MD at SNF or PCP   Hypothyroidism: Continue Synthroid  Bipolar disorder/anxiety: Appears to be stable-she is on chronic benzodiazepines for the past few years-dosage of Valium has been decreased by half.  PCP to consider slow taper in the outpatient setting.  Continue Seroquel.    Incidental finding of a possible right hilar mass: CT scan chest on 1/20 (without contrast) showed some possible fullness in the right hilar area-unable to rule out a right hilar mass with associated right hilar lymphadenopathy.  Unable to give IV contrast-during this admission due to AKI-spoke at length with patient spouse over the phone on 1/27-explain that this patient needs a repeat CT chest in the next few weeks-hopefully with IV contrast if renal function allows.  Family/spouse is aware of the potential for malignancy-he is aware that he needs to  make his primary care practitioner aware of this finding.  I will route this discharge summary to his PCP as well.  Primary care practitioner  to consider referral to pulmonology as well  Procedures/Studies: None  Discharge Diagnoses:  Principal Problem:   AMS (altered mental status) Active Problems:   SLE (systemic lupus erythematosus) ([facility])   Hypothyroid   Essential hypertension, benign   Acute renal failure superimposed on stage 3 chronic kidney disease (HCC)   RTA (renal tubular acidosis)   Chronic diastolic CHF (congestive heart failure) (HCC)   Bipolar disease, chronic (HCC)   Acute respiratory failure with hypoxemia (HCC)   OSA (obstructive sleep apnea)   Discharge Instructions:  Activity:  As tolerated with Full fall precautions use walker/cane & assistance as needed   Discharge Instructions    Ambulatory referral to Nephrology   Complete by:  As directed    Follow up in 2 weeks   Diet - low sodium heart healthy   Complete by:  As directed    Discharge instructions   Complete by:  As directed    1) Please take prescribed medications as instructed. 2)Do a CBC and BMP test in a week. 3) Follow-up with nephrology in 2 weeks.  Name and number of the provider has been attached. 4)Follow up with your rheumatologist and psychiatrist as an outpatient  You have an abnormal CT scan chest (possible right lung fullness-which could represent cancer) you need a repeat CT scan of the chest in 2-3 weeks with IV contrast if your kidney function allows.  Please ask your primary care practitioner to repeat a CT scan of the chest at your next visit with her.  Please also ask your primary care practitioner to refer you to pulmonology clinic.   Increase activity slowly   Complete by:  As directed      Allergies as of 08/02/2018   No Known Allergies     Medication List    STOP taking these medications   amLODipine 10 MG tablet Commonly known as:  NORVASC   diphenhydrAMINE 25 MG  tablet Commonly known as:  BENADRYL   psyllium 58.6 % powder Commonly known as:  METAMUCIL SMOOTH TEXTURE   spironolactone 25 MG tablet Commonly known as:  ALDACTONE     TAKE these medications   aspirin EC 81 MG tablet Take 1 tablet (81 mg total) by mouth daily. What changed:  Another medication with the same name was removed. Continue taking this medication, and follow the directions you see here.   atorvastatin 20 MG tablet Commonly known as:  LIPITOR Take 20 mg by mouth daily.   azaTHIOprine 50 MG tablet Commonly known as:  IMURAN Take 50 mg by mouth 2 (two) times daily.   calcitRIOL 0.25 MCG capsule Commonly known as:  ROCALTROL Take 0.25 mcg by mouth daily.   diazepam 10 MG tablet Commonly known as:  VALIUM Take 0.5 tablets (5 mg total) by mouth every 8 (eight) hours as needed for anxiety. What changed:  how much to take   escitalopram 10 MG tablet Commonly known as:  LEXAPRO Take 3 tablets (30 mg total) by mouth daily.   ferrous sulfate 325 (65 FE) MG tablet Take 1 tablet (325 mg total) by mouth daily. What changed:    medication strength  how much to take   folic acid 1 MG tablet Commonly known as:  FOLVITE Take 1 mg by mouth daily.   hydrALAZINE 50 MG tablet Commonly known as:  APRESOLINE Take 1 tablet (50 mg total) by mouth every 8 (eight) hours.   hydroxychloroquine 200 MG tablet Commonly known as:  PLAQUENIL Take 200 mg by  mouth daily.   levothyroxine 50 MCG tablet Commonly known as:  SYNTHROID, LEVOTHROID Take 50 mcg by mouth daily before breakfast.   metoprolol succinate 50 MG 24 hr tablet Commonly known as:  TOPROL-XL Take 50 mg by mouth daily. Take with or immediately following a meal.   NIFEdipine 90 MG 24 hr tablet Commonly known as:  ADALAT CC Take 1 tablet (90 mg total) by mouth daily.   omeprazole 20 MG capsule Commonly known as:  PRILOSEC Take 20 mg by mouth daily.   ondansetron 4 MG tablet Commonly known as:  ZOFRAN Take  1 tablet (4 mg total) by mouth every 6 (six) hours as needed for nausea.   polyethylene glycol packet Commonly known as:  MIRALAX / GLYCOLAX Take 17 g by mouth 2 (two) times daily. What changed:    when to take this  reasons to take this   Potassium Chloride ER 20 MEQ Tbcr Take 20 mEq by mouth daily for 5 days.   predniSONE 10 MG tablet Commonly known as:  DELTASONE Take 20 mg for daily 1 day, then, Take 10 mg for daily 1 day, then, Resume 5 mg daily for 1 day and stay on it What changed:    how much to take  how to take this  when to take this  additional instructions   QUEtiapine 400 MG tablet Commonly known as:  SEROQUEL Take 1 tablet (400 mg total) by mouth at bedtime.   sodium bicarbonate 650 MG tablet Take 1,300 mg by mouth 2 (two) times daily.   vitamin B-12 1000 MCG tablet Commonly known as:  CYANOCOBALAMIN Take 1,000 mcg by mouth daily.   ZANTAC 75 75 MG tablet Generic drug:  ranitidine Take 75 mg by mouth 2 (two) times daily as needed for indigestion.       Contact information for follow-up providers    [name], MD. Schedule an appointment as soon as possible for a visit in 1 week(s).   Specialty:  Family Medicine Contact information: [address] [place] [address] [phone]        DeterdingJeneen Rinks, MD. Schedule an appointment as soon as possible for a visit in 2 week(s).   Specialty:  Nephrology Contact information: [place] [address] [phone]        Brand Males, MD. Schedule an appointment as soon as possible for a visit in 2 week(s).   Specialty:  Pulmonary Disease Why:  for COPD and abnormal CT Chest Contact information: [address] [place] [address] [phone]            Contact information for after-discharge care    Destination    [facility] Preferred SNF .   Service:  Skilled Nursing Contact information: [address] [place] [phone]                 No Known Allergies  Consultations:   None  Other Procedures/Studies: Dg Chest 2 View  Result Date: 07/27/2018 CLINICAL DATA:  Respiratory failure. EXAM: CHEST - 2 VIEW COMPARISON:  CT 07/26/2017. FINDINGS: Mediastinum and hilar structures normal. Cardiomegaly with diffuse bilateral pulmonary interstitial prominence and small bilateral pleural effusions. No pneumothorax. No acute bony abnormality. IMPRESSION: Cardiomegaly with diffuse bilateral pulmonary interstitial prominence and small bilateral pleural effusions again noted. Similar findings on prior exams. Again CHF and/or pneumonitis could present in this fashion. Electronically Signed   By: Marcello Moores  Register  On: 07/27/2018 07:30   Ct Head Wo Contrast  Result Date: 07/23/2018 CLINICAL DATA:  Altered mental status with decreased responsiveness EXAM: CT HEAD WITHOUT CONTRAST TECHNIQUE: Contiguous axial images were obtained from the base of the skull through the vertex without intravenous contrast. COMPARISON:  Head CT June 15, 2018 and brain MRI June 15, 2018 FINDINGS: Brain: The ventricles are normal in size and configuration. There is no intracranial mass, hemorrhage, extra-axial fluid collection, or midline shift. Brain parenchyma appears unremarkable. No acute infarct evident. There is mild basal ganglia calcification which is likely physiologic in this age group. Vascular: No hyperdense vessel. There are foci of calcification in each carotid siphon region. Skull: Bony calvarium appears intact. Sinuses/Orbits: There is mucosal thickening in several ethmoid air cells. Other visualized paranasal sinuses are clear. Visualized orbits appear symmetric bilaterally. Other: Mastoid air cells are clear. IMPRESSION: No evident acute infarct. No mass or hemorrhage. Foci of arterial vascular calcification noted. There is mucosal thickening in several ethmoid air cells.  Electronically Signed   By: Lowella Grip III M.D.   On: 07/23/2018 10:53   Ct Chest Wo Contrast  Result Date: 07/26/2018 CLINICAL DATA:  58 year old female with history of COPD exacerbation. Bilateral upper chest soreness from coughing. EXAM: CT CHEST WITHOUT CONTRAST TECHNIQUE: Multidetector CT imaging of the chest was performed following the standard protocol without IV contrast. COMPARISON:  Chest CT 06/15/2018. FINDINGS: Cardiovascular: Heart size is borderline enlarged. Small volume of pericardial fluid and/or thickening, unlikely to be of any hemodynamic significance at this time. No associated pericardial calcification. Aortic atherosclerosis. No definite coronary artery calcifications. Mediastinum/Nodes: No definite pathologically enlarged mediastinal lymph nodes noted on today's noncontrast CT examination, although there is extensive fullness in the right hilar region which could indicate lymphadenopathy (poorly evaluated without IV contrast). Esophagus is unremarkable in appearance. No axillary lymphadenopathy. Lungs/Pleura: Trace right pleural effusion, most of which tracks in the inferior aspect of the right major fissure laterally. Extensive thickening of the peribronchovascular interstitium with surrounding ground-glass attenuation and associated interlobular septal thickening throughout the right lung, most evident in the right lower and middle lobes. Fullness in the right infrahilar region inseparable from adjacent hilar structures. Left lung is clear. Diffuse bronchial wall thickening with moderate centrilobular and mild paraseptal emphysema. Focal architectural distortion in the anterior aspect of the right upper lobe near the apex, most compatible with an area of post infectious or inflammatory scarring. Upper Abdomen: Aortic atherosclerosis. Musculoskeletal: There are no aggressive appearing lytic or blastic lesions noted in the visualized portions of the skeleton. IMPRESSION: 1. The  appearance of the chest is concerning for potential right hilar mass with associated right hilar lymphadenopathy. At this time, there is what appears to be asymmetric edema involving the right lower and middle lobes with small pleural effusion tracking in the right major fissure. Further evaluation with contrast enhanced chest CT is recommended to better evaluate these findings if there is clinical concern for malignancy. 2. Aortic atherosclerosis. 3. Diffuse bronchial wall thickening with moderate centrilobular and mild paraseptal emphysema; imaging findings suggestive of underlying COPD. Aortic Atherosclerosis (ICD10-I70.0) and Emphysema (ICD10-J43.9). Electronically Signed   By: Vinnie Langton M.D.   On: 07/26/2018 12:49   US Renal  Result Date: 07/27/2018 CLINICAL DATA:  Initial evaluation for acute renal injury EXAM: RENAL / URINARY TRACT ULTRASOUND COMPLETE COMPARISON:  Prior CT from 02/12/2018 FINDINGS: Right Kidney: Renal measurements: 10.3 x 6.1 x 6.7 cm = volume: 214.8 mL. Diffusely increased echogenicity, compatible with medical renal disease.  No hydronephrosis. 7 x 6 x 10 mm simple cyst present at the interpolar region. Few small cyst present at the lower pole the right kidney, largest measuring 1.2 x 1.2 x 1.3 cm. Left Kidney: Renal measurements: 8.7 x 4.3 x 4.2 cm = volume: 82.6 mL. Diffusely increased echogenicity, compatible with medical renal disease. No hydronephrosis. No focal renal mass. Bladder: Appears normal for degree of bladder distention. Bilateral ureteral jets visualized. IMPRESSION: 1. Diffusely increased echogenicity within the renal parenchyma, compatible with medical renal disease. 2. No hydronephrosis. 3. Simple right renal cysts as above. Electronically Signed   By: Jeannine Boga M.D.   On: 07/27/2018 00:26   Dg Chest Port 1 View  Result Date: 07/23/2018 CLINICAL DATA:  Altered level of consciousness. Dialysis. EXAM: PORTABLE CHEST 1 VIEW COMPARISON:  Chest  radiograph and CT 06/15/2018 FINDINGS: The patient is rotated to the right. The cardiac silhouette remains enlarged. Diffuse interstitial densities are unchanged from the prior radiograph though increased from older radiographs of 02/02/2018. No sizable pleural effusion or pneumothorax is identified. IMPRESSION: Cardiomegaly with unchanged interstitial densities which could reflect mild edema or atypical infection superimposed on chronic lung disease. Electronically Signed   By: Logan Bores M.D.   On: 07/23/2018 10:17     TODAY-DAY OF DISCHARGE:  Subjective:   [place] today has no headache,no chest abdominal pain,no new weakness tingling or numbness, feels much better wants to go home today.   Objective:   Blood pressure (!) 155/93, pulse 69, temperature 98.3 F (36.8 C), temperature source Oral, resp. rate 19, height 5\' 7"  (1.702 m), weight 80 kg, last menstrual period 07/07/2002, SpO2 100 %. No intake or output data in the 24 hours ending 08/02/18 1052 Filed Weights   07/24/18 0611  Weight: 80 kg    Exam: Awake Alert, Oriented *3, No new F.N deficits, Normal affect [place].AT,PERRAL Supple Neck,No JVD, No cervical lymphadenopathy appriciated.  Symmetrical Chest wall movement, Good air movement bilaterally, CTAB RRR,No Gallops,Rubs or new Murmurs, No Parasternal Heave +ve B.Sounds, Abd Soft, Non tender, No organomegaly appriciated, No rebound -guarding or rigidity. No Cyanosis, Clubbing or edema, No new Rash or bruise   PERTINENT RADIOLOGIC STUDIES: Dg Chest 2 View  Result Date: 07/27/2018 CLINICAL DATA:  Respiratory failure. EXAM: CHEST - 2 VIEW COMPARISON:  CT 07/26/2017. FINDINGS: Mediastinum and hilar structures normal. Cardiomegaly with diffuse bilateral pulmonary interstitial prominence and small bilateral pleural effusions. No pneumothorax. No acute bony abnormality. IMPRESSION: Cardiomegaly with diffuse bilateral pulmonary interstitial prominence and small bilateral  pleural effusions again noted. Similar findings on prior exams. Again CHF and/or pneumonitis could present in this fashion. Electronically Signed   By: Marcello Moores  Register   On: 07/27/2018 07:30   Ct Head Wo Contrast  Result Date: 07/23/2018 CLINICAL DATA:  Altered mental status with decreased responsiveness EXAM: CT HEAD WITHOUT CONTRAST TECHNIQUE: Contiguous axial images were obtained from the base of the skull through the vertex without intravenous contrast. COMPARISON:  Head CT June 15, 2018 and brain MRI June 15, 2018 FINDINGS: Brain: The ventricles are normal in size and configuration. There is no intracranial mass, hemorrhage, extra-axial fluid collection, or midline shift. Brain parenchyma appears unremarkable. No acute infarct evident. There is mild basal ganglia calcification which is likely physiologic in this age group. Vascular: No hyperdense vessel. There are foci of calcification in each carotid siphon region. Skull: Bony calvarium appears intact. Sinuses/Orbits: There is mucosal thickening in several ethmoid air cells. Other visualized paranasal sinuses are clear. Visualized orbits  appear symmetric bilaterally. Other: Mastoid air cells are clear. IMPRESSION: No evident acute infarct. No mass or hemorrhage. Foci of arterial vascular calcification noted. There is mucosal thickening in several ethmoid air cells. Electronically Signed   By: Lowella Grip III M.D.   On: 07/23/2018 10:53   Ct Chest Wo Contrast  Result Date: 07/26/2018 CLINICAL DATA:  58 year old female with history of COPD exacerbation. Bilateral upper chest soreness from coughing. EXAM: CT CHEST WITHOUT CONTRAST TECHNIQUE: Multidetector CT imaging of the chest was performed following the standard protocol without IV contrast. COMPARISON:  Chest CT 06/15/2018. FINDINGS: Cardiovascular: Heart size is borderline enlarged. Small volume of pericardial fluid and/or thickening, unlikely to be of any hemodynamic significance at  this time. No associated pericardial calcification. Aortic atherosclerosis. No definite coronary artery calcifications. Mediastinum/Nodes: No definite pathologically enlarged mediastinal lymph nodes noted on today's noncontrast CT examination, although there is extensive fullness in the right hilar region which could indicate lymphadenopathy (poorly evaluated without IV contrast). Esophagus is unremarkable in appearance. No axillary lymphadenopathy. Lungs/Pleura: Trace right pleural effusion, most of which tracks in the inferior aspect of the right major fissure laterally. Extensive thickening of the peribronchovascular interstitium with surrounding ground-glass attenuation and associated interlobular septal thickening throughout the right lung, most evident in the right lower and middle lobes. Fullness in the right infrahilar region inseparable from adjacent hilar structures. Left lung is clear. Diffuse bronchial wall thickening with moderate centrilobular and mild paraseptal emphysema. Focal architectural distortion in the anterior aspect of the right upper lobe near the apex, most compatible with an area of post infectious or inflammatory scarring. Upper Abdomen: Aortic atherosclerosis. Musculoskeletal: There are no aggressive appearing lytic or blastic lesions noted in the visualized portions of the skeleton. IMPRESSION: 1. The appearance of the chest is concerning for potential right hilar mass with associated right hilar lymphadenopathy. At this time, there is what appears to be asymmetric edema involving the right lower and middle lobes with small pleural effusion tracking in the right major fissure. Further evaluation with contrast enhanced chest CT is recommended to better evaluate these findings if there is clinical concern for malignancy. 2. Aortic atherosclerosis. 3. Diffuse bronchial wall thickening with moderate centrilobular and mild paraseptal emphysema; imaging findings suggestive of underlying  COPD. Aortic Atherosclerosis (ICD10-I70.0) and Emphysema (ICD10-J43.9). Electronically Signed   By: Vinnie Langton M.D.   On: 07/26/2018 12:49   US Renal  Result Date: 07/27/2018 CLINICAL DATA:  Initial evaluation for acute renal injury EXAM: RENAL / URINARY TRACT ULTRASOUND COMPLETE COMPARISON:  Prior CT from 02/12/2018 FINDINGS: Right Kidney: Renal measurements: 10.3 x 6.1 x 6.7 cm = volume: 214.8 mL. Diffusely increased echogenicity, compatible with medical renal disease. No hydronephrosis. 7 x 6 x 10 mm simple cyst present at the interpolar region. Few small cyst present at the lower pole the right kidney, largest measuring 1.2 x 1.2 x 1.3 cm. Left Kidney: Renal measurements: 8.7 x 4.3 x 4.2 cm = volume: 82.6 mL. Diffusely increased echogenicity, compatible with medical renal disease. No hydronephrosis. No focal renal mass. Bladder: Appears normal for degree of bladder distention. Bilateral ureteral jets visualized. IMPRESSION: 1. Diffusely increased echogenicity within the renal parenchyma, compatible with medical renal disease. 2. No hydronephrosis. 3. Simple right renal cysts as above. Electronically Signed   By: Jeannine Boga M.D.   On: 07/27/2018 00:26   Dg Chest Port 1 View  Result Date: 07/23/2018 CLINICAL DATA:  Altered level of consciousness. Dialysis. EXAM: PORTABLE CHEST 1 VIEW COMPARISON:  Chest  radiograph and CT 06/15/2018 FINDINGS: The patient is rotated to the right. The cardiac silhouette remains enlarged. Diffuse interstitial densities are unchanged from the prior radiograph though increased from older radiographs of 02/02/2018. No sizable pleural effusion or pneumothorax is identified. IMPRESSION: Cardiomegaly with unchanged interstitial densities which could reflect mild edema or atypical infection superimposed on chronic lung disease. Electronically Signed   By: Logan Bores M.D.   On: 07/23/2018 10:17     PERTINENT LAB RESULTS: CBC: No results for input(s): WBC, HGB,  HCT, PLT in the last 72 hours. CMET CMP     Component Value Date/Time   NA 140 07/30/2018 0705   K 3.3 (L) 07/30/2018 0705   CL 103 07/30/2018 0705   CO2 27 07/30/2018 0705   GLUCOSE 94 07/30/2018 0705   BUN 26 (H) 07/30/2018 0705   CREATININE 1.92 (H) 07/30/2018 0705   CREATININE 1.17 (H) 01/10/2013 1234   CALCIUM 8.8 (L) 07/30/2018 0705   PROT 7.4 07/23/2018 0954   ALBUMIN 3.9 07/23/2018 0954   AST 30 07/23/2018 0954   ALT 17 07/23/2018 0954   ALKPHOS 63 07/23/2018 0954   BILITOT 0.3 07/23/2018 0954   GFRNONAA 28 (L) 07/30/2018 0705   GFRAA 32 (L) 07/30/2018 0705    GFR Estimated Creatinine Clearance: 34.4 mL/min (A) (by C-G formula based on SCr of 1.92 mg/dL (H)). No results for input(s): LIPASE, AMYLASE in the last 72 hours. No results for input(s): CKTOTAL, CKMB, CKMBINDEX, TROPONINI in the last 72 hours. Invalid input(s): POCBNP No results for input(s): DDIMER in the last 72 hours. No results for input(s): HGBA1C in the last 72 hours. No results for input(s): CHOL, HDL, LDLCALC, TRIG, CHOLHDL, LDLDIRECT in the last 72 hours. No results for input(s): TSH, T4TOTAL, T3FREE, THYROIDAB in the last 72 hours.  Invalid input(s): FREET3 No results for input(s): VITAMINB12, FOLATE, FERRITIN, TIBC, IRON, RETICCTPCT in the last 72 hours. Coags: No results for input(s): INR in the last 72 hours.  Invalid input(s): PT Microbiology: Recent Results (from the past 240 hour(s))  Urine culture     Status: Abnormal   Collection Time: 07/23/18  1:42 PM  Result Value Ref Range Status   Specimen Description URINE, CLEAN CATCH  Final   Special Requests   Final    NONE Performed at [facility], [place] [address], [place], [place] [address]    Culture MULTIPLE SPECIES PRESENT, SUGGEST RECOLLECTION (A)  Final   Report Status 07/24/2018 FINAL  Final  Culture, blood (routine x 2)     Status: None   Collection Time: 07/23/18  7:12 PM  Result Value Ref Range Status   Specimen  Description BLOOD LEFT HAND  Final   Special Requests   Final    BOTTLES DRAWN AEROBIC ONLY Blood Culture adequate volume   Culture   Final    NO GROWTH 5 DAYS Performed at [facility], [place] [address] [place], [place] [address]    Report Status 07/28/2018 FINAL  Final  Culture, blood (routine x 2)     Status: None   Collection Time: 07/23/18  7:12 PM  Result Value Ref Range Status   Specimen Description BLOOD LEFT HAND  Final   Special Requests   Final    BOTTLES DRAWN AEROBIC ONLY Blood Culture adequate volume   Culture   Final    NO GROWTH 5 DAYS Performed at [facility], [place] [address] [place], [place] [address]    Report Status  07/28/2018 FINAL  Final  Expectorated sputum assessment w rflx to resp cult     Status: None   Collection Time: 07/26/18  3:31 PM  Result Value Ref Range Status   Specimen Description SPUTUM  Final   Special Requests NONE  Final   Sputum evaluation   Final    THIS SPECIMEN IS ACCEPTABLE FOR SPUTUM CULTURE Performed at [facility], [address] [place], [place] [address]    Report Status 07/29/2018 FINAL  Final  Culture, respiratory     Status: None   Collection Time: 07/26/18  3:31 PM  Result Value Ref Range Status   Specimen Description SPUTUM  Final   Special Requests NONE Reflexed from [id number]  Final   Gram Stain   Final    FEW WBC PRESENT, PREDOMINANTLY MONONUCLEAR RARE SQUAMOUS EPITHELIAL CELLS PRESENT RARE GRAM POSITIVE COCCI IN PAIRS Performed at [facility], [place] [address], [place], [place] [address]    Culture FEW Consistent with normal respiratory flora.  Final   Report Status 08/01/2018 FINAL  Final  MRSA PCR Screening     Status: None   Collection Time: 07/26/18  4:25 PM  Result Value Ref Range Status   MRSA by PCR NEGATIVE NEGATIVE Final    Comment:        The GeneXpert MRSA Assay (FDA approved for NASAL specimens only), is one component of a comprehensive MRSA colonization surveillance  program. It is not intended to diagnose MRSA infection nor to guide or monitor treatment for MRSA infections. Performed at [facility], [place] [address] [place], [place] [address]     FURTHER DISCHARGE INSTRUCTIONS:  Get Medicines reviewed and adjusted: Please take all your medications with you for your next visit with your Primary MD  Laboratory/radiological data: Please request your Primary MD to go over all hospital tests and procedure/radiological results at the follow up, please ask your Primary MD to get all Hospital records sent to his/her office.  In some cases, they will be blood work, cultures and biopsy results pending at the time of your discharge. Please request that your primary care M.D. goes through all the records of your hospital data and follows up on these results.  Also Note the following: If you experience worsening of your admission symptoms, develop shortness of breath, life threatening emergency, suicidal or homicidal thoughts you must seek medical attention immediately by calling 911 or calling your MD immediately  if symptoms less severe.  You must read complete instructions/literature along with all the possible adverse reactions/side effects for all the Medicines you take and that have been prescribed to you. Take any new Medicines after you have completely understood and accpet all the possible adverse reactions/side effects.   Do not drive when taking Pain medications or sleeping medications (Benzodaizepines)  Do not take more than prescribed Pain, Sleep and Anxiety Medications. It is not advisable to combine anxiety,sleep and pain medications without talking with your primary care practitioner  Special Instructions: If you have smoked or chewed Tobacco  in the last 2 yrs please stop smoking, stop any regular Alcohol  and or any Recreational drug use.  Wear Seat belts while driving.  Please note: You were cared for by a hospitalist during your  hospital stay. Once you are discharged, your primary care physician will handle any further medical issues. Please note that NO REFILLS for any discharge medications will be authorized once you are discharged, as it is imperative that you  return to your primary care physician (or establish a relationship with a primary care physician if you do not have one) for your post hospital discharge needs so that they can reassess your need for medications and monitor your lab values.  Total Time spent coordinating discharge including counseling, education and face to face time equals 35 minutes.  SignedOren Binet 08/02/2018 10:52 AM

## 2018-08-04 ENCOUNTER — Encounter: Payer: Self-pay | Admitting: Emergency Medicine

## 2018-08-04 DIAGNOSIS — F17200 Nicotine dependence, unspecified, uncomplicated: Secondary | ICD-10-CM | POA: Insufficient documentation

## 2018-08-04 DIAGNOSIS — F411 Generalized anxiety disorder: Secondary | ICD-10-CM | POA: Insufficient documentation

## 2018-08-18 ENCOUNTER — Inpatient Hospital Stay: Payer: BLUE CROSS/BLUE SHIELD | Admitting: Pulmonary Disease

## 2018-08-24 ENCOUNTER — Encounter: Payer: Self-pay | Admitting: Pulmonary Disease

## 2018-08-24 ENCOUNTER — Ambulatory Visit: Payer: BLUE CROSS/BLUE SHIELD | Admitting: Pulmonary Disease

## 2018-08-24 VITALS — BP 150/100 | HR 92 | Ht 66.0 in | Wt 172.8 lb

## 2018-08-24 DIAGNOSIS — R911 Solitary pulmonary nodule: Secondary | ICD-10-CM | POA: Diagnosis not present

## 2018-08-24 DIAGNOSIS — R0600 Dyspnea, unspecified: Secondary | ICD-10-CM

## 2018-08-24 DIAGNOSIS — R918 Other nonspecific abnormal finding of lung field: Secondary | ICD-10-CM | POA: Diagnosis not present

## 2018-08-24 NOTE — Progress Notes (Signed)
Anna [name]    [id number]    10-19-1960  Primary Care Physician:Howell, Bryn Gulling, MD  Referring Physician: [name], MD [place], [place] [address]  Chief complaint:   Follow-up after hospitalization for pneumonia Active smoker  HPI: 58 year old with heart failure, hypertension, hyperlipidemia, depression, lupus (Plaquenil and methotrexate], CVA. She was admitted in February 2018 for pneumonia, parapneumonic effusion which was evaluated by thoracentesis. It was exudative by light's criteria [LDH 982]. She has been treated with antibiotics, prednisone and discharged.  She has history of lupus and was on methotrexate in psat. In 2018 she had worsening lupus symptoms and was started on Imuran and is on Plaquenil and prednisone at 5 mg.  She follows at [facility].    Interim History: Hospitalized in January for acute encephalopathy likely metabolic and secondary to benzodiazepine use.  Also noted to have acute hypoxic respiratory failure which is treated with bronchodilators, steroids and empiric antimicrobial therapy with Augmentin.  She had acute kidney injury on this admission.  CT scan is read as right hilar fullness and further follow-up recommended.  No complaints of dyspnea at present.  No cough, sputum production, fever  Outpatient Encounter Medications as of 08/24/2018  Medication Sig  . aspirin EC 81 MG tablet Take 1 tablet (81 mg total) by mouth daily.  [name] atorvastatin (LIPITOR) 20 MG tablet Take 20 mg by mouth daily.  [name] azaTHIOprine (IMURAN) 50 MG tablet Take 50 mg by mouth 2 (two) times daily.   . calcitRIOL (ROCALTROL) 0.25 MCG capsule Take 0.25 mcg by mouth daily.  . diazepam (VALIUM) 10 MG tablet Take 0.5 tablets (5 mg total) by mouth every 8 (eight) hours as needed for anxiety.  [name] escitalopram (LEXAPRO) 10 MG tablet Take 3 tablets (30 mg total) by mouth daily.  . ferrous sulfate 325 (65 FE) MG tablet  Take 1 tablet (325 mg total) by mouth daily.  . folic acid (FOLVITE) 1 MG tablet Take 1 mg by mouth daily.  . hydrALAZINE (APRESOLINE) 50 MG tablet Take 1 tablet (50 mg total) by mouth every 8 (eight) hours.  . hydroxychloroquine (PLAQUENIL) 200 MG tablet Take 200 mg by mouth daily.   [name] levothyroxine (SYNTHROID, LEVOTHROID) 50 MCG tablet Take 50 mcg by mouth daily before breakfast.  . metoprolol succinate (TOPROL-XL) 50 MG 24 hr tablet Take 50 mg by mouth daily. Take with or immediately following a meal.  . NIFEdipine (ADALAT CC) 90 MG 24 hr tablet Take 1 tablet (90 mg total) by mouth daily.  [name] omeprazole (PRILOSEC) 20 MG capsule Take 20 mg by mouth daily.  . ondansetron (ZOFRAN) 4 MG tablet Take 1 tablet (4 mg total) by mouth every 6 (six) hours as needed for nausea.  . polyethylene glycol (MIRALAX / GLYCOLAX) packet Take 17 g by mouth 2 (two) times daily. (Patient taking differently: Take 17 g by mouth daily as needed (constipation). )  . potassium chloride 20 MEQ TBCR Take 20 mEq by mouth daily for 5 days.  . predniSONE (DELTASONE) 10 MG tablet Take 20 mg for daily 1 day, then, Take 10 mg for daily 1 day, then, Resume 5 mg daily for 1 day and stay on it  . QUEtiapine (SEROQUEL) 400 MG tablet Take 1 tablet (400 mg total) by mouth at bedtime.  . ranitidine (ZANTAC 75) 75 MG tablet Take 75 mg by mouth 2 (two) times daily as needed for indigestion.  [name]  sodium bicarbonate 650 MG tablet Take 1,300 mg by mouth 2 (two) times daily.  . vitamin B-12 (CYANOCOBALAMIN) 1000 MCG tablet Take 1,000 mcg by mouth daily.   No facility-administered encounter medications on file as of 08/24/2018.     Allergies as of 08/24/2018  . (No Known Allergies)    Past Medical History:  Diagnosis Date  . Acute CHF ([place]) 07/2016  . Depressive disorder, not elsewhere classified   . Family history of adverse reaction to anesthesia    mother passed away during surgery  . Hypertension   . Hypothyroid    Dr. Wilson Singer  .  Kidney stone   . Lupus (systemic lupus erythematosus) ([place])   . Migraine   . Pure hypercholesterolemia   . Stroke [facility])     Past Surgical History:  Procedure Laterality Date  . CESAREAN SECTION    . ECTOPIC PREGNANCY SURGERY     times 2  . JOINT REPLACEMENT    . LAPAROSCOPY     with rt salpingectomy  . [place] SURGERY  2003  . TONSILLECTOMY    . TOTAL ABDOMINAL HYSTERECTOMY  2004   ovaries retained, DUB  . TUBAL LIGATION  1985    Family History  Problem Relation Age of Onset  . Diabetes Mother   . Hypertension Sister   . Diabetes Maternal Grandmother   . Autoimmune disease Neg Hx     Social History   Socioeconomic History  . Marital status: Married    Spouse name: Ludwig Clarks  . Number of children: 1  . Years of education: 5  . Highest education level: Not on file  Occupational History  . Occupation: disability    Employer: UNEMPLOYED  Social Needs  . Financial resource strain: Not on file  . Food insecurity:    Worry: Not on file    Inability: Not on file  . Transportation needs:    Medical: Not on file    Non-medical: Not on file  Tobacco Use  . Smoking status: Current Every Day Smoker    Packs/day: 0.50    Years: 35.00    Pack years: 17.50    Types: Cigarettes  . Smokeless tobacco: Never Used  . Tobacco comment: Continued cessation encouraged  Substance and Sexual Activity  . Alcohol use: Yes    Comment: 1 drink every 3-4 months.  . Drug use: No  . Sexual activity: Yes    Partners: Male    Birth control/protection: Surgical    Comment: TAH  Lifestyle  . Physical activity:    Days per week: Not on file    Minutes per session: Not on file  . Stress: Not on file  Relationships  . Social connections:    Talks on phone: Not on file    Gets together: Not on file    Attends religious service: Not on file    Active member of club or organization: Not on file    Attends meetings of clubs or organizations: Not on file    Relationship status: Not on  file  . Intimate partner violence:    Fear of current or ex partner: Not on file    Emotionally abused: Not on file    Physically abused: Not on file    Forced sexual activity: Not on file  Other Topics Concern  . 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   Patient has one son, lives in [place].  Patient is disabled.   Patient has a college education.   Patient is right-handed.   Patient drinks some caffeine.   Review of systems: Review of Systems  Constitutional: Negative for fever and chills.  HENT: Negative.   Eyes: Negative for blurred vision.  Respiratory: as per HPI  Cardiovascular: Negative for chest pain and palpitations.  Gastrointestinal: Negative for vomiting, diarrhea, blood per rectum. Genitourinary: Negative for dysuria, urgency, frequency and hematuria.  Musculoskeletal: Negative for myalgias, back pain and joint pain.  Skin: Negative for itching and rash.  Neurological: Negative for dizziness, tremors, focal weakness, seizures and loss of consciousness.  Endo/Heme/Allergies: Negative for environmental allergies.  Psychiatric/Behavioral: Negative for depression, suicidal ideas and hallucinations.  All other systems reviewed and are negative.  Physical Exam: Blood pressure (!) 140/92, pulse 84, height 5\' 6"  (1.676 m), weight 186 lb 6.4 oz (84.6 kg), last menstrual period 07/07/2002, SpO2 99 %. Gen:      No acute distress HEENT:  EOMI, sclera anicteric Neck:     No masses; no thyromegaly Lungs:    Clear to auscultation bilaterally; normal respiratory effort CV:         Regular rate and rhythm; no murmurs Abd:      + bowel sounds; soft, non-tender; no palpable masses, no distension Ext:    No edema; adequate peripheral perfusion Skin:      Warm and dry; no rash Neuro: alert and oriented x 3 Psych: normal mood and affect  Data Reviewed: Imaging CT chest 08/11/16-right lower lobe consolidation with  moderate right effusion, moderate emphysema CT chest 06/15/2018- centrilobular emphysema, no acute abnormality CT chest 07/26/2018- possible right hilar mass with right hilar adenopathy, right greater than left pulmonary edema, moderate centrilobular and paraseptal emphysema. I have reviewed all images personally  PFTs 04/29/17 FVC 3.07 (104%], FEV1 2.32 [99%], F/F 76, TLC 93%, DLCO 38% Minimal obstruction, severe diffusion defect.  Echo 08/06/16 Mild LVH, LVEF 08-14%, grade 2 diastolic dysfunction, PA peak pressure 39 Trivial pericardial effusion.  Echo 07/13/17 Mild LVH, LVEF 48-18%, PA systolic pressure within normal range.  Serologies 08/13/16 Double-stranded DNA-55, C3-139, C4-5 SCL 70 negative  Assessment:  Abnormal CT scan PET scan is read as possible right hilar mass although the previous CT in December 2019 looked okay She is at high risk for malignancy as she is an active smoker.  Will order PET scan for further evaluation She cannot get CT with contrast due to chronic kidney disease.  Emphysema Even though she has significant emphysematous changes on CT scan her PFTs do not show any overt obstruction by F/F criteria Not on any inhalers at present  Lupus Manifest primarily as arthritis. There is no evidence of lung involvement with no interstitial lung disease. She continues on Imuran, prednisone and Plaquenil per rheumatology.  Pulmonary hypertension Mildly elevated systolic pressure noted at last admission. Repeat echo shows normal PA pressure  Smoker Continue to smoke. Smoking cessation encouraged.   Plan/Recommendations: - PET scan - Smoking cessation  Marshell Garfinkel MD [facility] 08/24/2018, 3:48 PM  CC: [name], MD

## 2018-08-24 NOTE — Patient Instructions (Signed)
We will schedule you for a PET scan for further evaluation of the abnormal CT scan Follow-up in 2 weeks for review and plan for next office.

## 2018-08-24 NOTE — Addendum Note (Signed)
Addended by: Jannette Spanner on: 08/24/2018 04:13 PM   Modules accepted: Orders

## 2018-08-29 ENCOUNTER — Emergency Department (HOSPITAL_COMMUNITY): Payer: BLUE CROSS/BLUE SHIELD

## 2018-08-29 ENCOUNTER — Other Ambulatory Visit: Payer: Self-pay

## 2018-08-29 ENCOUNTER — Encounter (HOSPITAL_COMMUNITY): Payer: Self-pay | Admitting: Emergency Medicine

## 2018-08-29 ENCOUNTER — Inpatient Hospital Stay (HOSPITAL_COMMUNITY)
Admission: EM | Admit: 2018-08-29 | Discharge: 2018-09-12 | DRG: 870 | Disposition: A | Discharge status: Home Health | Payer: BLUE CROSS/BLUE SHIELD | Attending: Internal Medicine | Admitting: Internal Medicine

## 2018-08-29 ENCOUNTER — Inpatient Hospital Stay (HOSPITAL_COMMUNITY): Payer: BLUE CROSS/BLUE SHIELD

## 2018-08-29 DIAGNOSIS — J9602 Acute respiratory failure with hypercapnia: Secondary | ICD-10-CM

## 2018-08-29 DIAGNOSIS — A419 Sepsis, unspecified organism: Secondary | ICD-10-CM | POA: Diagnosis present

## 2018-08-29 DIAGNOSIS — G4733 Obstructive sleep apnea (adult) (pediatric): Secondary | ICD-10-CM | POA: Diagnosis present

## 2018-08-29 DIAGNOSIS — G4700 Insomnia, unspecified: Secondary | ICD-10-CM | POA: Diagnosis not present

## 2018-08-29 DIAGNOSIS — E874 Mixed disorder of acid-base balance: Secondary | ICD-10-CM | POA: Diagnosis present

## 2018-08-29 DIAGNOSIS — E782 Mixed hyperlipidemia: Secondary | ICD-10-CM

## 2018-08-29 DIAGNOSIS — J189 Pneumonia, unspecified organism: Secondary | ICD-10-CM

## 2018-08-29 DIAGNOSIS — F411 Generalized anxiety disorder: Secondary | ICD-10-CM | POA: Diagnosis present

## 2018-08-29 DIAGNOSIS — N170 Acute kidney failure with tubular necrosis: Secondary | ICD-10-CM | POA: Diagnosis present

## 2018-08-29 DIAGNOSIS — N183 Chronic kidney disease, stage 3 unspecified: Secondary | ICD-10-CM

## 2018-08-29 DIAGNOSIS — I130 Hypertensive heart and chronic kidney disease with heart failure and stage 1 through stage 4 chronic kidney disease, or unspecified chronic kidney disease: Secondary | ICD-10-CM | POA: Diagnosis present

## 2018-08-29 DIAGNOSIS — E872 Acidosis: Secondary | ICD-10-CM | POA: Diagnosis present

## 2018-08-29 DIAGNOSIS — R6521 Severe sepsis with septic shock: Secondary | ICD-10-CM | POA: Diagnosis present

## 2018-08-29 DIAGNOSIS — Z9289 Personal history of other medical treatment: Secondary | ICD-10-CM

## 2018-08-29 DIAGNOSIS — E039 Hypothyroidism, unspecified: Secondary | ICD-10-CM | POA: Diagnosis present

## 2018-08-29 DIAGNOSIS — J80 Acute respiratory distress syndrome: Secondary | ICD-10-CM | POA: Diagnosis not present

## 2018-08-29 DIAGNOSIS — R4182 Altered mental status, unspecified: Secondary | ICD-10-CM | POA: Diagnosis present

## 2018-08-29 DIAGNOSIS — E875 Hyperkalemia: Secondary | ICD-10-CM | POA: Diagnosis not present

## 2018-08-29 DIAGNOSIS — F319 Bipolar disorder, unspecified: Secondary | ICD-10-CM | POA: Diagnosis present

## 2018-08-29 DIAGNOSIS — F320 Major depressive disorder, single episode, mild: Secondary | ICD-10-CM

## 2018-08-29 DIAGNOSIS — A481 Legionnaires' disease: Secondary | ICD-10-CM | POA: Diagnosis not present

## 2018-08-29 DIAGNOSIS — M329 Systemic lupus erythematosus, unspecified: Secondary | ICD-10-CM | POA: Diagnosis not present

## 2018-08-29 DIAGNOSIS — D539 Nutritional anemia, unspecified: Secondary | ICD-10-CM | POA: Diagnosis present

## 2018-08-29 DIAGNOSIS — J13 Pneumonia due to Streptococcus pneumoniae: Secondary | ICD-10-CM | POA: Diagnosis not present

## 2018-08-29 DIAGNOSIS — E669 Obesity, unspecified: Secondary | ICD-10-CM | POA: Diagnosis present

## 2018-08-29 DIAGNOSIS — D500 Iron deficiency anemia secondary to blood loss (chronic): Secondary | ICD-10-CM | POA: Diagnosis not present

## 2018-08-29 DIAGNOSIS — Z79899 Other long term (current) drug therapy: Secondary | ICD-10-CM

## 2018-08-29 DIAGNOSIS — J9600 Acute respiratory failure, unspecified whether with hypoxia or hypercapnia: Secondary | ICD-10-CM | POA: Diagnosis present

## 2018-08-29 DIAGNOSIS — I272 Pulmonary hypertension, unspecified: Secondary | ICD-10-CM | POA: Diagnosis present

## 2018-08-29 DIAGNOSIS — E878 Other disorders of electrolyte and fluid balance, not elsewhere classified: Secondary | ICD-10-CM | POA: Diagnosis present

## 2018-08-29 DIAGNOSIS — Z7189 Other specified counseling: Secondary | ICD-10-CM

## 2018-08-29 DIAGNOSIS — R0902 Hypoxemia: Secondary | ICD-10-CM | POA: Diagnosis not present

## 2018-08-29 DIAGNOSIS — K5900 Constipation, unspecified: Secondary | ICD-10-CM | POA: Diagnosis present

## 2018-08-29 DIAGNOSIS — J449 Chronic obstructive pulmonary disease, unspecified: Secondary | ICD-10-CM | POA: Diagnosis present

## 2018-08-29 DIAGNOSIS — Z79890 Hormone replacement therapy (postmenopausal): Secondary | ICD-10-CM

## 2018-08-29 DIAGNOSIS — J810 Acute pulmonary edema: Secondary | ICD-10-CM | POA: Diagnosis not present

## 2018-08-29 DIAGNOSIS — I5032 Chronic diastolic (congestive) heart failure: Secondary | ICD-10-CM | POA: Diagnosis present

## 2018-08-29 DIAGNOSIS — G9340 Encephalopathy, unspecified: Secondary | ICD-10-CM

## 2018-08-29 DIAGNOSIS — T82594A Other mechanical complication of infusion catheter, initial encounter: Secondary | ICD-10-CM

## 2018-08-29 DIAGNOSIS — N179 Acute kidney failure, unspecified: Secondary | ICD-10-CM | POA: Diagnosis not present

## 2018-08-29 DIAGNOSIS — E785 Hyperlipidemia, unspecified: Secondary | ICD-10-CM | POA: Diagnosis present

## 2018-08-29 DIAGNOSIS — G9341 Metabolic encephalopathy: Secondary | ICD-10-CM | POA: Diagnosis present

## 2018-08-29 DIAGNOSIS — I10 Essential (primary) hypertension: Secondary | ICD-10-CM | POA: Diagnosis not present

## 2018-08-29 DIAGNOSIS — Z8249 Family history of ischemic heart disease and other diseases of the circulatory system: Secondary | ICD-10-CM

## 2018-08-29 DIAGNOSIS — R338 Other retention of urine: Secondary | ICD-10-CM | POA: Diagnosis not present

## 2018-08-29 DIAGNOSIS — R34 Anuria and oliguria: Secondary | ICD-10-CM | POA: Diagnosis present

## 2018-08-29 DIAGNOSIS — J9601 Acute respiratory failure with hypoxia: Secondary | ICD-10-CM | POA: Diagnosis present

## 2018-08-29 DIAGNOSIS — R6520 Severe sepsis without septic shock: Secondary | ICD-10-CM

## 2018-08-29 DIAGNOSIS — N189 Chronic kidney disease, unspecified: Secondary | ICD-10-CM | POA: Diagnosis not present

## 2018-08-29 DIAGNOSIS — R918 Other nonspecific abnormal finding of lung field: Secondary | ICD-10-CM | POA: Diagnosis present

## 2018-08-29 DIAGNOSIS — R590 Localized enlarged lymph nodes: Secondary | ICD-10-CM | POA: Diagnosis not present

## 2018-08-29 DIAGNOSIS — R8290 Unspecified abnormal findings in urine: Secondary | ICD-10-CM | POA: Diagnosis not present

## 2018-08-29 DIAGNOSIS — E876 Hypokalemia: Secondary | ICD-10-CM | POA: Diagnosis not present

## 2018-08-29 DIAGNOSIS — K219 Gastro-esophageal reflux disease without esophagitis: Secondary | ICD-10-CM | POA: Diagnosis present

## 2018-08-29 DIAGNOSIS — Z833 Family history of diabetes mellitus: Secondary | ICD-10-CM

## 2018-08-29 DIAGNOSIS — Z0189 Encounter for other specified special examinations: Secondary | ICD-10-CM

## 2018-08-29 DIAGNOSIS — F17210 Nicotine dependence, cigarettes, uncomplicated: Secondary | ICD-10-CM | POA: Diagnosis present

## 2018-08-29 DIAGNOSIS — Z8673 Personal history of transient ischemic attack (TIA), and cerebral infarction without residual deficits: Secondary | ICD-10-CM

## 2018-08-29 DIAGNOSIS — Z7952 Long term (current) use of systemic steroids: Secondary | ICD-10-CM

## 2018-08-29 DIAGNOSIS — Z7982 Long term (current) use of aspirin: Secondary | ICD-10-CM

## 2018-08-29 DIAGNOSIS — F201 Disorganized schizophrenia: Secondary | ICD-10-CM | POA: Diagnosis not present

## 2018-08-29 DIAGNOSIS — E032 Hypothyroidism due to medicaments and other exogenous substances: Secondary | ICD-10-CM

## 2018-08-29 LAB — POCT I-STAT 7, (LYTES, BLD GAS, ICA,H+H)
Acid-base deficit: 9 mmol/L — ABNORMAL HIGH (ref 0.0–2.0)
Acid-base deficit: 11 mmol/L — ABNORMAL HIGH (ref 0.0–2.0)
Bicarbonate: 15.2 mmol/L — ABNORMAL LOW (ref 20.0–28.0)
Bicarbonate: 15.2 mmol/L — ABNORMAL LOW (ref 20.0–28.0)
Calcium, Ion: 1.16 mmol/L (ref 1.15–1.40)
Calcium, Ion: 1.14 mmol/L — ABNORMAL LOW (ref 1.15–1.40)
HCT: 21 % — ABNORMAL LOW (ref 36.0–46.0)
HCT: 17 % — ABNORMAL LOW (ref 36.0–46.0)
Hemoglobin: 7.1 g/dL — ABNORMAL LOW (ref 12.0–15.0)
Hemoglobin: 5.8 g/dL — CL (ref 12.0–15.0)
O2 Saturation: 92 %
O2 Saturation: 88 %
Patient temperature: 99.5
Potassium: 4 mmol/L (ref 3.5–5.1)
Potassium: 4.6 mmol/L (ref 3.5–5.1)
Sodium: 140 mmol/L (ref 135–145)
Sodium: 141 mmol/L (ref 135–145)
TCO2: 16 mmol/L — ABNORMAL LOW (ref 22–32)
TCO2: 16 mmol/L — ABNORMAL LOW (ref 22–32)
pCO2 arterial: 26.2 mmHg — ABNORMAL LOW (ref 32.0–48.0)
pCO2 arterial: 38 mmHg (ref 32.0–48.0)
pH, Arterial: 7.37 (ref 7.350–7.450)
pH, Arterial: 7.213 — ABNORMAL LOW (ref 7.350–7.450)
pO2, Arterial: 65 mmHg — ABNORMAL LOW (ref 83.0–108.0)
pO2, Arterial: 67 mmHg — ABNORMAL LOW (ref 83.0–108.0)

## 2018-08-29 LAB — CBC WITH DIFFERENTIAL/PLATELET
Abs Immature Granulocytes: 0.15 10*3/uL — ABNORMAL HIGH (ref 0.00–0.07)
Basophils Absolute: 0 10*3/uL (ref 0.0–0.1)
Basophils Relative: 0 %
Eosinophils Absolute: 0 10*3/uL (ref 0.0–0.5)
Eosinophils Relative: 0 %
HCT: 21.2 % — ABNORMAL LOW (ref 36.0–46.0)
Hemoglobin: 7.1 g/dL — ABNORMAL LOW (ref 12.0–15.0)
Immature Granulocytes: 1 %
LYMPHS PCT: 3 %
Lymphs Abs: 0.3 10*3/uL — ABNORMAL LOW (ref 0.7–4.0)
MCH: 37.6 pg — ABNORMAL HIGH (ref 26.0–34.0)
MCHC: 33.5 g/dL (ref 30.0–36.0)
MCV: 112.2 fL — AB (ref 80.0–100.0)
Monocytes Absolute: 0.5 10*3/uL (ref 0.1–1.0)
Monocytes Relative: 4 %
Neutro Abs: 11.1 10*3/uL — ABNORMAL HIGH (ref 1.7–7.7)
Neutrophils Relative %: 92 %
PLATELETS: 213 10*3/uL (ref 150–400)
RBC: 1.89 MIL/uL — ABNORMAL LOW (ref 3.87–5.11)
RDW: 16.4 % — ABNORMAL HIGH (ref 11.5–15.5)
WBC: 12.1 10*3/uL — ABNORMAL HIGH (ref 4.0–10.5)
nRBC: 0 % (ref 0.0–0.2)

## 2018-08-29 LAB — LACTIC ACID, PLASMA
LACTIC ACID, VENOUS: 1.4 mmol/L (ref 0.5–1.9)
Lactic Acid, Venous: 1.3 mmol/L (ref 0.5–1.9)

## 2018-08-29 LAB — URINALYSIS, ROUTINE W REFLEX MICROSCOPIC
Bilirubin Urine: NEGATIVE
Glucose, UA: NEGATIVE mg/dL
KETONES UR: NEGATIVE mg/dL
Leukocytes,Ua: NEGATIVE
Nitrite: NEGATIVE
Protein, ur: 100 mg/dL — AB
Specific Gravity, Urine: 1.02 (ref 1.005–1.030)
pH: 5 (ref 5.0–8.0)

## 2018-08-29 LAB — COMPREHENSIVE METABOLIC PANEL
ALT: 11 U/L (ref 0–44)
AST: 15 U/L (ref 15–41)
Albumin: 2.2 g/dL — ABNORMAL LOW (ref 3.5–5.0)
Alkaline Phosphatase: 69 U/L (ref 38–126)
Anion gap: 12 (ref 5–15)
BUN: 47 mg/dL — ABNORMAL HIGH (ref 6–20)
CHLORIDE: 112 mmol/L — AB (ref 98–111)
CO2: 15 mmol/L — ABNORMAL LOW (ref 22–32)
CREATININE: 3.13 mg/dL — AB (ref 0.44–1.00)
Calcium: 8.2 mg/dL — ABNORMAL LOW (ref 8.9–10.3)
GFR calc Af Amer: 18 mL/min — ABNORMAL LOW (ref >60)
GFR calc non Af Amer: 16 mL/min — ABNORMAL LOW (ref >60)
Glucose, Bld: 120 mg/dL — ABNORMAL HIGH (ref 70–99)
POTASSIUM: 4 mmol/L (ref 3.5–5.1)
Sodium: 139 mmol/L (ref 135–145)
Total Bilirubin: 0.5 mg/dL (ref 0.3–1.2)
Total Protein: 6 g/dL — ABNORMAL LOW (ref 6.5–8.1)

## 2018-08-29 LAB — STREP PNEUMONIAE URINARY ANTIGEN: Strep Pneumo Urinary Antigen: NEGATIVE

## 2018-08-29 LAB — ECHOCARDIOGRAM LIMITED
Height: 66 in
Weight: 2764.82 oz

## 2018-08-29 LAB — RETICULOCYTES
Immature Retic Fract: 9.5 % (ref 2.3–15.9)
RBC.: 1.8 MIL/uL — ABNORMAL LOW (ref 3.87–5.11)
Retic Count, Absolute: 10.1 10*3/uL — ABNORMAL LOW (ref 19.0–186.0)
Retic Ct Pct: 0.6 % (ref 0.4–3.1)

## 2018-08-29 LAB — FERRITIN: Ferritin: 331 ng/mL — ABNORMAL HIGH (ref 11–307)

## 2018-08-29 LAB — VITAMIN B12: Vitamin B-12: 647 pg/mL (ref 180–914)

## 2018-08-29 LAB — IRON AND TIBC
Iron: 9 ug/dL — ABNORMAL LOW (ref 28–170)
Saturation Ratios: 7 % — ABNORMAL LOW (ref 10.4–31.8)
TIBC: 126 ug/dL — ABNORMAL LOW (ref 250–450)
UIBC: 117 ug/dL

## 2018-08-29 LAB — MRSA PCR SCREENING: MRSA by PCR: NEGATIVE

## 2018-08-29 LAB — PROTIME-INR
INR: 1.46
Prothrombin Time: 17.5 seconds — ABNORMAL HIGH (ref 11.4–15.2)

## 2018-08-29 LAB — PREPARE RBC (CROSSMATCH)

## 2018-08-29 LAB — GLUCOSE, CAPILLARY
GLUCOSE-CAPILLARY: 100 mg/dL — AB (ref 70–99)
Glucose-Capillary: 145 mg/dL — ABNORMAL HIGH (ref 70–99)

## 2018-08-29 LAB — PROCALCITONIN: Procalcitonin: 5.92 ng/mL

## 2018-08-29 LAB — FOLATE: Folate: 33.2 ng/mL (ref >5.9)

## 2018-08-29 LAB — INFLUENZA PANEL BY PCR (TYPE A & B)
Influenza A By PCR: NEGATIVE
Influenza B By PCR: NEGATIVE

## 2018-08-29 LAB — TROPONIN I: Troponin I: 0.07 ng/mL (ref <0.03)

## 2018-08-29 LAB — BRAIN NATRIURETIC PEPTIDE: B Natriuretic Peptide: 392.9 pg/mL — ABNORMAL HIGH (ref 0.0–100.0)

## 2018-08-29 MED ORDER — DEXMEDETOMIDINE HCL IN NACL 200 MCG/50ML IV SOLN
0.4000 ug/kg/h | INTRAVENOUS | Status: DC
  Administered 2018-08-29: 0.4 ug/kg/h via INTRAVENOUS
  Filled 2018-08-29: qty 50

## 2018-08-29 MED ORDER — FENTANYL CITRATE (PF) 100 MCG/2ML IJ SOLN
INTRAMUSCULAR | Status: AC
  Administered 2018-08-29: 100 ug
  Filled 2018-08-29: qty 2

## 2018-08-29 MED ORDER — DEXMEDETOMIDINE HCL IN NACL 200 MCG/50ML IV SOLN: 0.4000 ug/kg/h | INTRAVENOUS | Status: DC

## 2018-08-29 MED ORDER — LEVOTHYROXINE SODIUM 50 MCG PO TABS
50.0000 ug | ORAL_TABLET | Freq: Every day | ORAL | Status: DC
  Administered 2018-08-30 – 2018-09-12 (×14): 50 ug via ORAL
  Filled 2018-08-29 (×14): qty 1

## 2018-08-29 MED ORDER — FOLIC ACID 1 MG PO TABS
1.0000 mg | ORAL_TABLET | Freq: Every day | ORAL | Status: DC
  Administered 2018-08-30 – 2018-09-12 (×14): 1 mg via ORAL
  Filled 2018-08-29 (×14): qty 1

## 2018-08-29 MED ORDER — ETOMIDATE 2 MG/ML IV SOLN
20.0000 mg | Freq: Once | INTRAVENOUS | Status: AC
  Administered 2018-08-29: 20 mg via INTRAVENOUS

## 2018-08-29 MED ORDER — HYDROCORTISONE NA SUCCINATE PF 100 MG IJ SOLR
50.0000 mg | Freq: Four times a day (QID) | INTRAMUSCULAR | Status: DC
  Administered 2018-08-29 – 2018-09-05 (×29): 50 mg via INTRAVENOUS
  Filled 2018-08-29 (×29): qty 2

## 2018-08-29 MED ORDER — LACTATED RINGERS IV BOLUS (SEPSIS)
500.0000 mL | Freq: Once | INTRAVENOUS | Status: AC
  Administered 2018-08-29: 500 mL via INTRAVENOUS

## 2018-08-29 MED ORDER — MIDAZOLAM HCL 2 MG/2ML IJ SOLN
INTRAMUSCULAR | Status: AC
  Administered 2018-08-29: 2 mg
  Filled 2018-08-29: qty 2

## 2018-08-29 MED ORDER — SODIUM CHLORIDE 0.9 % IV SOLN
2.0000 g | Freq: Once | INTRAVENOUS | Status: AC
  Administered 2018-08-29: 2 g via INTRAVENOUS
  Filled 2018-08-29: qty 2

## 2018-08-29 MED ORDER — LACTATED RINGERS IV SOLN
INTRAVENOUS | Status: DC
  Administered 2018-08-29 – 2018-08-30 (×3): via INTRAVENOUS

## 2018-08-29 MED ORDER — MIDAZOLAM HCL 2 MG/2ML IJ SOLN
2.0000 mg | Freq: Once | INTRAMUSCULAR | Status: DC
  Filled 2018-08-29: qty 2

## 2018-08-29 MED ORDER — ROCURONIUM BROMIDE 50 MG/5ML IV SOLN
100.0000 mg | Freq: Once | INTRAVENOUS | Status: AC
  Administered 2018-08-29: 50 mg via INTRAVENOUS
  Filled 2018-08-29: qty 10

## 2018-08-29 MED ORDER — LACTATED RINGERS IV BOLUS (SEPSIS)
1000.0000 mL | Freq: Once | INTRAVENOUS | Status: AC
  Administered 2018-08-29: 1000 mL via INTRAVENOUS

## 2018-08-29 MED ORDER — CHLORHEXIDINE GLUCONATE 0.12% ORAL RINSE (MEDLINE KIT)
15.0000 mL | Freq: Two times a day (BID) | OROMUCOSAL | Status: DC
  Administered 2018-08-29 – 2018-09-06 (×16): 15 mL via OROMUCOSAL

## 2018-08-29 MED ORDER — FENTANYL 2500MCG IN NS 250ML (10MCG/ML) PREMIX INFUSION
0.0000 ug/h | INTRAVENOUS | Status: DC
  Administered 2018-08-29: 50 ug/h via INTRAVENOUS
  Administered 2018-08-30: 100 ug/h via INTRAVENOUS
  Administered 2018-08-31: 120 ug/h via INTRAVENOUS
  Administered 2018-09-01: 325 ug/h via INTRAVENOUS
  Administered 2018-09-01: 150 ug/h via INTRAVENOUS
  Administered 2018-09-02 – 2018-09-03 (×4): 325 ug/h via INTRAVENOUS
  Administered 2018-09-03 – 2018-09-04 (×3): 250 ug/h via INTRAVENOUS
  Administered 2018-09-04: 300 ug/h via INTRAVENOUS
  Administered 2018-09-05: 200 ug/h via INTRAVENOUS
  Administered 2018-09-05: 225 ug/h via INTRAVENOUS
  Administered 2018-09-06: 300 ug/h via INTRAVENOUS
  Filled 2018-08-29 (×16): qty 250

## 2018-08-29 MED ORDER — SODIUM CHLORIDE 0.9 % IV SOLN
1.0000 g | INTRAVENOUS | Status: DC
  Administered 2018-08-30 – 2018-08-31 (×2): 1 g via INTRAVENOUS
  Filled 2018-08-29 (×2): qty 1

## 2018-08-29 MED ORDER — HYDROXYCHLOROQUINE SULFATE 200 MG PO TABS
200.0000 mg | ORAL_TABLET | Freq: Every day | ORAL | Status: DC
  Administered 2018-08-30 – 2018-09-12 (×14): 200 mg via ORAL
  Filled 2018-08-29 (×14): qty 1

## 2018-08-29 MED ORDER — VANCOMYCIN HCL 10 G IV SOLR
1500.0000 mg | Freq: Once | INTRAVENOUS | Status: AC
  Administered 2018-08-29: 1500 mg via INTRAVENOUS
  Filled 2018-08-29: qty 1500

## 2018-08-29 MED ORDER — LEVOFLOXACIN IN D5W 500 MG/100ML IV SOLN
500.0000 mg | INTRAVENOUS | Status: DC
  Administered 2018-08-29: 500 mg via INTRAVENOUS
  Filled 2018-08-29: qty 100

## 2018-08-29 MED ORDER — PANTOPRAZOLE SODIUM 40 MG IV SOLR
40.0000 mg | Freq: Every day | INTRAVENOUS | Status: DC
  Administered 2018-08-29 – 2018-09-07 (×10): 40 mg via INTRAVENOUS
  Filled 2018-08-29 (×10): qty 40

## 2018-08-29 MED ORDER — VANCOMYCIN HCL IN DEXTROSE 750-5 MG/150ML-% IV SOLN
750.0000 mg | INTRAVENOUS | Status: DC
  Administered 2018-08-30: 750 mg via INTRAVENOUS
  Filled 2018-08-29 (×2): qty 150

## 2018-08-29 MED ORDER — INSULIN ASPART 100 UNIT/ML ~~LOC~~ SOLN
0.0000 [IU] | SUBCUTANEOUS | Status: DC
  Administered 2018-08-29 – 2018-08-31 (×8): 2 [IU] via SUBCUTANEOUS
  Administered 2018-08-31: 3 [IU] via SUBCUTANEOUS
  Administered 2018-09-01 – 2018-09-02 (×6): 2 [IU] via SUBCUTANEOUS
  Administered 2018-09-02: 3 [IU] via SUBCUTANEOUS
  Administered 2018-09-02 – 2018-09-03 (×4): 2 [IU] via SUBCUTANEOUS
  Administered 2018-09-03: 3 [IU] via SUBCUTANEOUS
  Administered 2018-09-04 – 2018-09-08 (×13): 2 [IU] via SUBCUTANEOUS

## 2018-08-29 MED ORDER — DEXMEDETOMIDINE HCL IN NACL 400 MCG/100ML IV SOLN
0.4000 ug/kg/h | INTRAVENOUS | Status: DC
  Administered 2018-08-29: 0.9 ug/kg/h via INTRAVENOUS
  Administered 2018-08-30: 0.6 ug/kg/h via INTRAVENOUS
  Administered 2018-08-30 (×2): 0.7 ug/kg/h via INTRAVENOUS
  Administered 2018-08-31: 0.5 ug/kg/h via INTRAVENOUS
  Administered 2018-08-31 (×2): 0.7 ug/kg/h via INTRAVENOUS
  Administered 2018-09-01: 0.25 ug/kg/h via INTRAVENOUS
  Filled 2018-08-29 (×8): qty 100

## 2018-08-29 MED ORDER — PROPOFOL 1000 MG/100ML IV EMUL
INTRAVENOUS | Status: AC
  Administered 2018-08-29: 13:00:00
  Filled 2018-08-29: qty 100

## 2018-08-29 MED ORDER — HEPARIN SODIUM (PORCINE) 5000 UNIT/ML IJ SOLN
5000.0000 [IU] | Freq: Three times a day (TID) | INTRAMUSCULAR | Status: DC
  Administered 2018-08-29 – 2018-09-11 (×40): 5000 [IU] via SUBCUTANEOUS
  Filled 2018-08-29 (×41): qty 1

## 2018-08-29 MED ORDER — ACETAMINOPHEN 650 MG RE SUPP
650.0000 mg | Freq: Once | RECTAL | Status: AC
  Administered 2018-08-29: 650 mg via RECTAL
  Filled 2018-08-29: qty 1

## 2018-08-29 MED ORDER — SODIUM CHLORIDE 0.9% IV SOLUTION
Freq: Once | INTRAVENOUS | Status: AC
  Administered 2018-08-29: 21:00:00 via INTRAVENOUS

## 2018-08-29 MED ORDER — ORAL CARE MOUTH RINSE
15.0000 mL | OROMUCOSAL | Status: DC
  Administered 2018-08-29 – 2018-09-06 (×76): 15 mL via OROMUCOSAL

## 2018-08-29 MED ORDER — VITAMIN B-12 1000 MCG PO TABS
1000.0000 ug | ORAL_TABLET | Freq: Every day | ORAL | Status: DC
  Administered 2018-08-30 – 2018-09-12 (×14): 1000 ug via ORAL
  Filled 2018-08-29 (×14): qty 1

## 2018-08-29 MED ORDER — PROPOFOL 10 MG/ML IV BOLUS
INTRAVENOUS | Status: AC
  Filled 2018-08-29: qty 20

## 2018-08-29 MED ORDER — SODIUM CHLORIDE 0.9 % IV SOLN
Freq: Once | INTRAVENOUS | Status: AC
  Administered 2018-08-29: 11:00:00 via INTRAVENOUS

## 2018-08-29 NOTE — Progress Notes (Signed)
Pharmacy Antibiotic Note  Anna [name] is a 58 y.o. female admitted on 08/29/2018 with fever and AMS.  Patient was admitted in January and received vancomycin and Zosyn for PNA.  Pharmacy has been consulted for vancomycin and cefepime dosing for sepsis.  SCr 3.13 (BL SCr 1.9-2), CrCL 20 ml/min, Tmax 104.1, LA 1.4.  Plan: Vanc 1500mg  IV x 1, then 750mg  IV Q24H - no AUC dosing d/t AKI Cefepime 2gm IV x 1, then 1gm IV Q24H Monitor renal fxn, clinical progress, vanc level as indicated   Height: 5\' 6"  (167.6 cm) Weight: 172 lb 12.8 oz (78.4 kg) IBW/kg (Calculated) : 59.3  Temp (24hrs), Avg:104.1 F (40.1 C), Min:104.1 F (40.1 C), Max:104.1 F (40.1 C)  Recent Labs  Lab 08/29/18 0747 08/29/18 0814  WBC PENDING  --   CREATININE 3.13*  --   LATICACIDVEN  --  1.4    Estimated Creatinine Clearance: 20.4 mL/min (A) (by C-G formula based on SCr of 3.13 mg/dL (H)).    No Known Allergies   Vanc 2/23 >> Cefepime 2/23 >>  2/23 UCx - 2/23 BCx -   Hyatt Capobianco D. Mina Marble, PharmD, BCPS, [place] 08/29/2018, 9:13 AM

## 2018-08-29 NOTE — ED Provider Notes (Addendum)
[facility] EMERGENCY DEPARTMENT Provider Note   CSN: [id number] Arrival date & time: 08/29/18  2841    History   Chief Complaint Chief Complaint  Patient presents with  . Fever  . Altered Mental Status   Level 5 caveat: Respiratory distress  HPI Anna [name] is a 58 y.o. female.     HPI Patient is a 58 year old female presents to the emergency department from home with her husband found her minimally responsive.  On EMS arrival the patient was found to be tachypneic tachycardic hypotensive down into the 70s and hypoxic to the 50s.  Fever of 101 noted in route.  On arrival to the emergency department the patient has O2 sats in the mid 80s on 15 L nonrebreather with associated tachypnea.  Reports fever over the past 24 hours and muscle aches.  Reports cough.  Recently hospitalized a month ago with acute respiratory failure and what sounds like possible unintentional overdose of benzodiazepines.   Past Medical History:  Diagnosis Date  . Acute CHF ([place]) 07/2016  . Depressive disorder, not elsewhere classified   . Family history of adverse reaction to anesthesia    mother passed away during surgery  . Hypertension   . Hypothyroid    Dr. Wilson Singer  . Kidney stone   . Lupus (systemic lupus erythematosus) ([place])   . Migraine   . Pure hypercholesterolemia   . Stroke [facility])     Patient Active Problem List   Diagnosis Date Noted  . GAD (generalized anxiety disorder) 08/04/2018  . Smoker 08/04/2018  . Acute respiratory failure with hypoxemia ([place])   . OSA (obstructive sleep apnea)   . AMS (altered mental status) 07/23/2018  . Chronic diastolic CHF (congestive heart failure) ([place]) 07/23/2018  . Bipolar disease, chronic ([place]) 07/23/2018  . Elevated troponin 06/16/2018  . CKD (chronic kidney disease), stage III ([place]) 06/16/2018  . Acute renal failure superimposed on stage 3 chronic kidney disease ([place]) 06/15/2018  . 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  Weakness on right side of face 06/15/2018  . Constipation in female 02/12/2018  . Orthostatic hypotension 02/12/2018  . History of TIA (transient ischemic attack) 02/12/2018  . Intractable nausea and vomiting 02/12/2018  . Pulmonary hypertension ([place]) 08/25/2016  . Pleural effusion   . Pulmonary edema   . SIRS (systemic inflammatory response syndrome) ([facility])   . Accidental drug overdose   . Acute diastolic CHF (congestive heart failure) ([place])   . HCAP (healthcare-associated pneumonia)   . Long-term use of Plaquenil   . Anemia 08/04/2016  . Acute respiratory failure with hypoxia ([place]) 08/04/2016  . Acute encephalopathy 08/04/2016  . Migraine without aura 12/09/2012  . Unspecified constipation 12/08/2012  . GERD (gastroesophageal reflux disease) 12/08/2012  . Multinodular goiter 08/20/2011  . Essential hypertension, benign 12/25/2010  . SLE (systemic lupus erythematosus) ([place]) 11/27/2010  . Depression 11/27/2010  . Hyperlipidemia 11/27/2010  . Hypothyroid 11/27/2010  . Encounter for long-term (current) use of other medications 11/27/2010    Past Surgical History:  Procedure Laterality Date  . CESAREAN SECTION    . ECTOPIC PREGNANCY SURGERY     times 2  . JOINT REPLACEMENT    . LAPAROSCOPY     with rt salpingectomy  . [place] SURGERY  2003  . TONSILLECTOMY    . TOTAL ABDOMINAL HYSTERECTOMY  2004   ovaries retained, DUB  . [place]     OB History    Gravida  5  Para  1   Term  1   Preterm      AB  4   Living  1     SAB      TAB  2   Ectopic  2   Multiple      Live Births  1            Home Medications    Prior to Admission medications   Medication Sig Start Date End Date Taking? Authorizing Provider  aspirin EC 81 MG tablet Take 1 tablet (81 mg total) by mouth daily. 06/18/18 06/18/19  Ghimire, Henreitta Leber, MD  atorvastatin (LIPITOR) 20 MG tablet Take 20 mg by mouth daily.    [provider]  azaTHIOprine (IMURAN) 50 MG  tablet Take 50 mg by mouth 2 (two) times daily.  02/20/17   [provider]  calcitRIOL (ROCALTROL) 0.25 MCG capsule Take 0.25 mcg by mouth daily. 04/21/18   [provider]  diazepam (VALIUM) 10 MG tablet Take 0.5 tablets (5 mg total) by mouth every 8 (eight) hours as needed for anxiety. 07/30/18   Shelly Coss, MD  escitalopram (LEXAPRO) 10 MG tablet Take 3 tablets (30 mg total) by mouth daily. 07/20/18   Donnal Moat T, PA-C  ferrous sulfate 325 (65 FE) MG tablet Take 1 tablet (325 mg total) by mouth daily. 07/30/18 07/30/19  Shelly Coss, MD  folic acid (FOLVITE) 1 MG tablet Take 1 mg by mouth daily.    [provider]  hydrALAZINE (APRESOLINE) 50 MG tablet Take 1 tablet (50 mg total) by mouth every 8 (eight) hours. 07/30/18   Shelly Coss, MD  hydroxychloroquine (PLAQUENIL) 200 MG tablet Take 200 mg by mouth daily.  02/27/17   [provider]  levothyroxine (SYNTHROID, LEVOTHROID) 50 MCG tablet Take 50 mcg by mouth daily before breakfast.    [provider]  metoprolol succinate (TOPROL-XL) 50 MG 24 hr tablet Take 50 mg by mouth daily. Take with or immediately following a meal.    [provider]  NIFEdipine (ADALAT CC) 90 MG 24 hr tablet Take 1 tablet (90 mg total) by mouth daily. 08/02/18   Ghimire, Henreitta Leber, MD  omeprazole (PRILOSEC) 20 MG capsule Take 20 mg by mouth daily.    [provider]  ondansetron (ZOFRAN) 4 MG tablet Take 1 tablet (4 mg total) by mouth every 6 (six) hours as needed for nausea. 02/13/18   Mariel Aloe, MD  polyethylene glycol (MIRALAX / GLYCOLAX) packet Take 17 g by mouth 2 (two) times daily. Patient taking differently: Take 17 g by mouth daily as needed (constipation).  02/13/18   Mariel Aloe, MD  potassium chloride 20 MEQ TBCR Take 20 mEq by mouth daily for 5 days. 07/31/18 08/05/18  Shelly Coss, MD  predniSONE (DELTASONE) 10 MG tablet Take 20 mg for daily 1 day, then, Take 10 mg for daily 1  day, then, Resume 5 mg daily for 1 day and stay on it 08/02/18   Jonetta Osgood, MD  QUEtiapine (SEROQUEL) 400 MG tablet Take 1 tablet (400 mg total) by mouth at bedtime. 07/20/18   Addison Lank, PA-C  ranitidine (ZANTAC 75) 75 MG tablet Take 75 mg by mouth 2 (two) times daily as needed for indigestion.    [provider]  sodium bicarbonate 650 MG tablet Take 1,300 mg by mouth 2 (two) times daily.    [provider]  vitamin B-12 (CYANOCOBALAMIN) 1000 MCG tablet Take 1,000 mcg  by mouth daily.    [provider]    Family History Family History  Problem Relation Age of Onset  . Diabetes Mother   . Hypertension Sister   . Diabetes Maternal Grandmother   . Autoimmune disease Neg Hx     Social History Social History   Tobacco Use  . Smoking status: Current Every Day Smoker    Packs/day: 0.50    Years: 35.00    Pack years: 17.50    Types: Cigarettes  . Smokeless tobacco: Never Used  . Tobacco comment: Continued cessation encouraged  Substance Use Topics  . Alcohol use: Yes    Comment: 1 drink every 3-4 months.  . Drug use: No     Allergies   Patient has no known allergies.   Review of Systems Review of Systems  Unable to perform ROS: Severe respiratory distress     Physical Exam Updated Vital Signs BP 111/69   Pulse (!) 127   Temp (!) 104.1 F (40.1 C) (Rectal)   Resp (!) 30   Ht 5\' 6"  (1.676 m)   Wt 78.4 kg   LMP 07/07/2002 (Exact Date)   SpO2 94%   BMI 27.89 kg/m   Physical Exam Vitals signs and nursing note reviewed.  Constitutional:      General: She is in acute distress.     Appearance: She is well-developed. She is ill-appearing.  HENT:     Head: Normocephalic and atraumatic.     Mouth/Throat:     Mouth: Mucous membranes are dry.  Neck:     Musculoskeletal: Normal range of motion and neck supple. No neck rigidity.  Cardiovascular:     Rate and Rhythm: Regular rhythm. Tachycardia present.     Heart sounds: Normal  heart sounds.  Pulmonary:     Effort: Respiratory distress present.     Breath sounds: No stridor. Rhonchi present. No wheezing or rales.  Abdominal:     General: There is no distension.     Palpations: Abdomen is soft.     Tenderness: There is no abdominal tenderness.  Musculoskeletal:        General: No tenderness.  Skin:    General: Skin is warm.  Neurological:     General: No focal deficit present.     Mental Status: She is alert.  Psychiatric:     Comments: Unable to test      ED Treatments / Results  Labs (all labs ordered are listed, but only abnormal results are displayed) Labs Reviewed  COMPREHENSIVE METABOLIC PANEL - Abnormal; Notable for the following components:      Result Value   Chloride 112 (*)    CO2 15 (*)    Glucose, Bld 120 (*)    BUN 47 (*)    Creatinine, Ser 3.13 (*)    Calcium 8.2 (*)    Total Protein 6.0 (*)    Albumin 2.2 (*)    GFR calc non Af Amer 16 (*)    GFR calc Af Amer 18 (*)    All other components within normal limits  CBC WITH DIFFERENTIAL/PLATELET - Abnormal; Notable for the following components:   RBC 1.89 (*)    Hemoglobin 7.1 (*)    HCT 21.2 (*)    MCV 112.2 (*)    MCH 37.6 (*)    RDW 16.4 (*)    All other components within normal limits  PROTIME-INR - Abnormal; Notable for the following components:   Prothrombin Time 17.5 (*)  All other components within normal limits  POCT I-STAT 7, (LYTES, BLD GAS, ICA,H+H) - Abnormal; Notable for the following components:   pCO2 arterial 26.2 (*)    pO2, Arterial 65.0 (*)    Bicarbonate 15.2 (*)    TCO2 16 (*)    Acid-base deficit 9.0 (*)    HCT 21.0 (*)    Hemoglobin 7.1 (*)    All other components within normal limits  CULTURE, BLOOD (ROUTINE X 2)  CULTURE, BLOOD (ROUTINE X 2)  URINE CULTURE  LACTIC ACID, PLASMA  LACTIC ACID, PLASMA  URINALYSIS, ROUTINE W REFLEX MICROSCOPIC  INFLUENZA PANEL BY PCR (TYPE A & B)  I-STAT ARTERIAL BLOOD GAS, ED    EKG EKG  Interpretation  Date/Time:  Sunday August 29 2018 07:22:22 EST Ventricular Rate:  132 PR Interval:    QRS Duration: 84 QT Interval:  291 QTC Calculation: 432 R Axis:   66 Text Interpretation:  Sinus tachycardia Low voltage, extremity leads Minimal ST depression, anterolateral leads No significant change was found Confirmed by Jola Schmidt [phone]) on 08/29/2018 7:27:47 AM Also confirmed by Jola Schmidt [phone]), editor Philomena Doheny [phone])  on 08/29/2018 8:45:16 AM   Radiology Dg Chest Port 1 View  Result Date: 08/29/2018 CLINICAL DATA:  Fever and altered mental status. EXAM: PORTABLE CHEST 1 VIEW COMPARISON:  07/27/2018 FINDINGS: The cardiac silhouette is enlarged and globular, with more prominent size than on the prior radiograph dated 07/27/2018. These findings are concerning for a pericardial effusion. There is no evidence of pneumothorax. Right mid lung confluent airspace opacity. Small right pleural effusion. Osseous structures are without acute abnormality. Soft tissues are grossly normal. IMPRESSION: 1. Enlarged and globular cardiac silhouette, with more prominent size than on the prior radiograph dated 07/27/2014. These findings are concerning for a pericardial effusion. 2. Right mid lung confluent airspace opacity likely represents developing pneumonia. 3. Small right pleural effusion. 4. Critical Value/emergent results were called by telephone at the time of interpretation on 08/29/2018 at 8:02 am to Dr. Jola Schmidt , who verbally acknowledged these results. Electronically Signed   By: Fidela Salisbury M.D.   On: 08/29/2018 08:02    Procedures .Critical Care Performed by: Jola Schmidt, MD Authorized by: Jola Schmidt, MD   Critical care provider statement:    Critical care time (minutes):  45   Critical care was time spent personally by me on the following activities:  Discussions with consultants, evaluation of patient's response to treatment, examination of patient,  ordering and performing treatments and interventions, ordering and review of laboratory studies, ordering and review of radiographic studies, pulse oximetry, re-evaluation of patient's condition, obtaining history from patient or surrogate and review of old charts   (including critical care time)  Medications Ordered in ED Medications  vancomycin (VANCOCIN) 1,500 mg in sodium chloride 0.9 % 500 mL IVPB (1,500 mg Intravenous New Bag/Given 08/29/18 4259)  lactated ringers bolus 1,000 mL (1,000 mLs Intravenous New Bag/Given 08/29/18 0736)    And  lactated ringers bolus 1,000 mL (1,000 mLs Intravenous New Bag/Given 08/29/18 0757)    And  lactated ringers bolus 500 mL (0 mLs Intravenous Stopped 08/29/18 0815)  acetaminophen (TYLENOL) suppository 650 mg (650 mg Rectal Given 08/29/18 0824)  ceFEPIme (MAXIPIME) 2 g in sodium chloride 0.9 % 100 mL IVPB (2 g Intravenous New Bag/Given 08/29/18 5638)     Initial Impression / Assessment and Plan / ED Course  I have reviewed the triage vital signs and the nursing notes.  Pertinent labs &  imaging results that were available during my care of the patient were reviewed by me and considered in my medical decision making (see chart for details).        Respiratory distress on arrival.  On a nonrebreather mask her sats were still in the low to mid 80s.  Placed on BiPAP for tachypnea and hypoxia.  Found to have a fever of 104.1.  Influenza precautions.  Influenza pending.  Broad-spectrum antibiotics for hypotension tachycardia and fever to cover for sepsis.  Labs and chest x-ray pending  Chest x-ray concerning for right-sided infiltrate.  Broad-spectrum antibiotic should cover this.  New abnormal heart size concerning for possible pericardial effusion.  She does have a history of lupus.  Initial bedside ultrasound by myself demonstrates no obvious large pericardial effusion.  Formal limited echocardiogram to be obtained at the bedside for formal review.  Patient  will need management in the intensive care unit given her profound hypoxia.  She is at high risk for requiring intubation if she does not improve on BiPAP.  Fever treated with rectal suppository.  Acute kidney injury.  Worsening anemia.  Hemoccult test will need to be collected.  Anemia panel ordered.  Type and screen added.  9:15 AM echocardiographer at the bedside now Admit to ICU  9:36 AM Discussed case with Dr Nelda Marseille who reviewed her labs and images and blood gas and believes the patient can be admitted to step down unit with the hospitalist service.  He requests hospitalist call critical care team for any worsening or change in condition    Final Clinical Impressions(s) / ED Diagnoses   Final diagnoses:  Acute respiratory failure with hypoxia ([place])  Hypoxia  Sepsis with acute hypoxic respiratory failure, due to unspecified organism, unspecified whether septic shock present [facility])    ED Discharge Orders    None       Jola Schmidt, MD 08/29/18 5852    Jola Schmidt, MD 08/29/18 [phone]

## 2018-08-29 NOTE — ED Notes (Signed)
Attempted report and was advised the room was dirty and the RN was not available.

## 2018-08-29 NOTE — Progress Notes (Signed)
I have examined patient at the bedside with ER physician.  Patient is on 50% FiO2 now.  She is very tachypneic, unable to finish sentences, very poor bilateral air entry.  Temperature 104.  Acute renal failure.  Hemoglobin 7.1.  With her overall condition, I have requested ICU to do bedside examination before admitting to stepdown unit.  Case called and discussed with intensivist on-call.  Holding off on admission orders at this time.  Addendum: Patient seen by ICU team.  Going to ICU.

## 2018-08-29 NOTE — Procedures (Signed)
OGT Placement By MD  OGT placed under direct laryngoscopy and verified by auscultation.  Anna [name], M.D. [facility]. Pager: [phone]. After hours pager: [phone]. 

## 2018-08-29 NOTE — ED Notes (Signed)
Rechecked rectal temp. 100.8 RN notified.

## 2018-08-29 NOTE — H&P (Addendum)
NAME:  Anna [name], MRN:  [id number], DOB:  1960-08-27, LOS: 0 ADMISSION DATE:  08/29/2018, CONSULTATION DATE:  2/23 REFERRING MD:  Venora Maples, CHIEF COMPLAINT:  Acute hypoxic respiratory failure and Pneumonia   Brief History   58 year old female patient on Imuran, Plaquenil, and prednisone for history of lupus.  Admitted 2/23 with pneumonia and respiratory failure requiring noninvasive positive pressure ventilation.  Admitting to intensive care given high risk for clinical decompensation  History of present illness   58 year old female with history of lupus, on Imuran, Plaquenil, and prednisone at baseline, seen in our office 2/18 for routine pulmonary follow-up, no acute pulmonary symptoms at that point.  Presented to the emergency room on 2/23 with 24-hour history of cough, myalgias, worsening shortness of breath, and altered sensorium.  EMS called, temperature 101.  On arrival found to be tachypneic, hypoxic With room air saturations 85%, and also new marked right-sided infiltrate.  Was placed on noninvasive positive pressure ventilation with improvement in work of breathing however did remain tachypneic and tachycardic.  Pulmonary admitting to intensive care given high risk of clinical decompensation  Past Medical History  Lupus on Imuran and Plaquenil as well as prednisone 5 mg daily.  Lupus primarily affecting joints, no evidence of pulmonary involvement as of yet.  Pulmonary hypertension, tobacco abuse, hypertension, hyperlipidemia, depression, prior CVA, admitted in February 2018 for pneumonia with parapneumonic effusion, also just discharged from hospitalization in January 2024 benzodiazepine overdose  Significant Hospital Events   2/23: Presented the emergency room With 24-hour history of cough, myalgias, fever as high as 101 and altered sensorium chest x-ray demonstrating pulmonary infiltrate on the right, hypoxic and tachypneic requiring BiPAP support critical care admitting  Consults:     Procedures:    Significant Diagnostic Tests:    Micro Data:  Respiratory viral panel 2/23>>> Urine strep antigen 2/23>>> Urine Legionella antigen 2/23>>> Sputum culture 2/23>>> Blood culture 2/23>>  Antimicrobials:  Vancomycin 2/23 Zosyn 2/23 Levaquin 2/23  Interim history/subjective:  Feels better on BiPAP work of breathing improved per respiratory therapist  Objective   Blood pressure 114/76, pulse (Abnormal) 118, temperature (Abnormal) 100.8 F (38.2 C), temperature source Rectal, resp. rate (Abnormal) 37, height 5\' 6"  (1.676 m), weight 78.4 kg, last menstrual period 07/07/2002, SpO2 95 %.    FiO2 (%):  [40 %-50 %] 40 %   Intake/Output Summary (Last 24 hours) at 08/29/2018 1140 Last data filed at 08/29/2018 9323 Gross per 24 hour  Intake 2000 ml  Output no documentation  Net 2000 ml   Filed Weights   08/29/18 0740  Weight: 78.4 kg    Examination: General: Acutely ill, superimposed on chronically ill-appearing 58 year old female currently on BiPAP HENT: Pupils equal reactive, current BiPAP mask in place.  No JVD Lungs: Scattered rhonchi with accessory use right greater than left Cardiovascular: Tachycardic regular rhythm Abdomen: Soft nontender Extremities: Trace lower extremity edema brisk cap refill strong pulses Neuro: Awake, confused but follows commands GU: Due to void  Resolved Hospital Problem list     Assessment & Plan:  Acute hypoxic respiratory failure in the setting of pneumonia.  She is immunocompromised on Imuran and prednisone Portable chest x-ray personally reviewed this demonstrates severe right sided infiltrate Plan Admit to intensive care BiPAP Follow-up arterial blood gas later today, with close clinical observation as appears to be high risk for intubation Panculture, send urinary antigens, and respiratory viral panel Hold Imuran Broad-spectrum antibiotics including nosocomial coverage given recent hospitalization in January, also  adding Levaquin for  atypical coverage If intubated would be reasonable to get bronchoscopy  Severe sepsis secondary to pneumonia -Was volume responsive in the emergency room however did present with systolic pressure in the 25K.  Lactic acid was normal Plan Maintenance IV fluids, lactated Ringer at 100 cc an hour Stress dose steroids given chronic prednisone Admitting to intensive care Antibiotics as mentioned above  Pulmonary hypertension Plan Telemetry monitoring Avoiding hypoxia  Acute metabolic encephalopathy in the setting of sepsis and hypoxia Plan Holding sedating medications Supportive care She is chronically on Seroquel, as well as Lexapro and as needed Valium.  We will need to reevaluate this daily, but holding for now  Acute on chronic renal failure stage III; appears as though baseline serum creatinine around 1.9 Plan IV hydration Renal dose medications Strict intake output Follow-up blood chemistry  Fluid electrolyte balance/acid-base imbalance: Mild hyperchloremia, non-anion gap metabolic acidosis Plan Admitting to intensive care IV hydration with lactated Ringer's Serial chemistries  Macrocytic anemia anemia, 1 month ago hemoglobin 7.9, currently 7.1, she is followed by hematology at [facility], felt secondary to her lupus Plan Trending CBC Transfuse for hemoglobin less than 7  Hilar lung mass Plan Follow-up outpatient PET scan  Hypothyroidism Plan Continue supplementation  Nicotine dependence Plan Smoking cessation  History of lupus Plan Holding Imuran Follow-up rheumatology   Best practice:  Diet: N.p.o. Pain/Anxiety/Delirium protocol (if indicated): Not applicable VAP protocol (if indicated): Not applicable DVT prophylaxis: Subcutaneous heparin GI prophylaxis: PPI Glucose control: Sliding scale insulin Mobility: Bedrest Code Status: Full code Family Communication: Pending Disposition: Critically ill due to high risk of progressive  respiratory failure admitting to intensive care for close observation, and high concern about need for intubation  Labs   CBC: Recent Labs  Lab 08/29/18 0747 08/29/18 0802  WBC 12.1*  --   NEUTROABS 11.1*  --   HGB 7.1* 7.1*  HCT 21.2* 21.0*  MCV 112.2*  --   PLT 213  --     Basic Metabolic Panel: Recent Labs  Lab 08/29/18 0747 08/29/18 0802  NA 139 140  K 4.0 4.0  CL 112*  --   CO2 15*  --   GLUCOSE 120*  --   BUN 47*  --   CREATININE 3.13*  --   CALCIUM 8.2*  --    GFR: Estimated Creatinine Clearance: 20.4 mL/min (A) (by C-G formula based on SCr of 3.13 mg/dL (H)). Recent Labs  Lab 08/29/18 0747 08/29/18 0814 08/29/18 0943  WBC 12.1*  --   --   LATICACIDVEN  --  1.4 1.3    Liver Function Tests: Recent Labs  Lab 08/29/18 0747  AST 15  ALT 11  ALKPHOS 69  BILITOT 0.5  PROT 6.0*  ALBUMIN 2.2*   No results for input(s): LIPASE, AMYLASE in the last 168 hours. No results for input(s): AMMONIA in the last 168 hours.  ABG    Component Value Date/Time   PHART 7.370 08/29/2018 0802   PCO2ART 26.2 (L) 08/29/2018 0802   PO2ART 65.0 (L) 08/29/2018 0802   HCO3 15.2 (L) 08/29/2018 0802   TCO2 16 (L) 08/29/2018 0802   ACIDBASEDEF 9.0 (H) 08/29/2018 0802   O2SAT 92.0 08/29/2018 0802     Coagulation Profile: Recent Labs  Lab 08/29/18 0747  INR 1.46    Cardiac Enzymes: Recent Labs  Lab 08/29/18 0941  TROPONINI 0.07*    HbA1C: No results found for: HGBA1C  CBG: No results for input(s): GLUCAP in the last 168 hours.  Review of Systems:  Not able  Past Medical History  She,  has a past medical history of Acute CHF ([place]) (07/2016), Depressive disorder, not elsewhere classified, Family history of adverse reaction to anesthesia, Hypertension, Hypothyroid, Kidney stone, Lupus (systemic lupus erythematosus) ([place]), Migraine, Pure hypercholesterolemia, and Stroke ([place]).   Surgical History    Past Surgical History:  Procedure Laterality Date  .  CESAREAN SECTION    . ECTOPIC PREGNANCY SURGERY     times 2  . JOINT REPLACEMENT    . LAPAROSCOPY     with rt salpingectomy  . [facility]gron Torres SURGERY  2003  . TONSILLECTOMY    . TOTAL ABDOMINAL HYSTERECTOMY  2004   ovaries retained, DUB  . [place]     Social History   reports that she has been smoking cigarettes. She has a 17.50 pack-year smoking history. She has never used smokeless tobacco. She reports current alcohol use. She reports that she does not use drugs.   Family History   Her family history includes Diabetes in her maternal grandmother and mother; Hypertension in her sister. There is no history of Autoimmune disease.   Allergies No Known Allergies   Home Medications  Prior to Admission medications   Medication Sig Start Date End Date Taking? Authorizing Provider  aspirin EC 81 MG tablet Take 1 tablet (81 mg total) by mouth daily. 06/18/18 06/18/19  Ghimire, Henreitta Leber, MD  atorvastatin (LIPITOR) 20 MG tablet Take 20 mg by mouth daily.    [provider]  azaTHIOprine (IMURAN) 50 MG tablet Take 50 mg by mouth 2 (two) times daily.  02/20/17   [provider]  calcitRIOL (ROCALTROL) 0.25 MCG capsule Take 0.25 mcg by mouth daily. 04/21/18   [provider]  diazepam (VALIUM) 10 MG tablet Take 0.5 tablets (5 mg total) by mouth every 8 (eight) hours as needed for anxiety. 07/30/18   Shelly Coss, MD  escitalopram (LEXAPRO) 10 MG tablet Take 3 tablets (30 mg total) by mouth daily. 07/20/18   Donnal Moat T, PA-C  ferrous sulfate 325 (65 FE) MG tablet Take 1 tablet (325 mg total) by mouth daily. 07/30/18 07/30/19  Shelly Coss, MD  folic acid (FOLVITE) 1 MG tablet Take 1 mg by mouth daily.    [provider]  hydrALAZINE (APRESOLINE) 50 MG tablet Take 1 tablet (50 mg total) by mouth every 8 (eight) hours. 07/30/18   Shelly Coss, MD  hydroxychloroquine (PLAQUENIL) 200 MG tablet Take 200 mg by mouth daily.  02/27/17   [provider]  levothyroxine (SYNTHROID, LEVOTHROID) 50 MCG tablet Take 50 mcg by mouth daily before breakfast.    [provider]  metoprolol succinate (TOPROL-XL) 50 MG 24 hr tablet Take 50 mg by mouth daily. Take with or immediately following a meal.    [provider]  metoprolol tartrate (LOPRESSOR) 50 MG tablet Take 50 mg by mouth daily. 08/11/18   [provider]  NIFEdipine (ADALAT CC) 90 MG 24 hr tablet Take 1 tablet (90 mg total) by mouth daily. 08/02/18   Ghimire, Henreitta Leber, MD  omeprazole (PRILOSEC) 20 MG capsule Take 20 mg by mouth daily.    [provider]  ondansetron (ZOFRAN) 4 MG tablet Take 1 tablet (4 mg total) by mouth every 6 (six) hours as needed for nausea. 02/13/18   Mariel Aloe, MD  polyethylene glycol (MIRALAX / GLYCOLAX) packet Take 17 g by mouth 2 (two) times daily. Patient taking differently: Take 17 g by mouth daily  as needed (constipation).  02/13/18   Mariel Aloe, MD  potassium chloride 20 MEQ TBCR Take 20 mEq by mouth daily for 5 days. 07/31/18 08/05/18  Shelly Coss, MD  predniSONE (DELTASONE) 10 MG tablet Take 20 mg for daily 1 day, then, Take 10 mg for daily 1 day, then, Resume 5 mg daily for 1 day and stay on it 08/02/18   Jonetta Osgood, MD  QUEtiapine (SEROQUEL) 400 MG tablet Take 1 tablet (400 mg total) by mouth at bedtime. 07/20/18   Addison Lank, PA-C  ranitidine (ZANTAC 75) 75 MG tablet Take 75 mg by mouth 2 (two) times daily as needed for indigestion.    [provider]  sodium bicarbonate 650 MG tablet Take 1,300 mg by mouth 2 (two) times daily.    [provider]  vitamin B-12 (CYANOCOBALAMIN) 1000 MCG tablet Take 1,000 mcg by mouth daily.    [provider]     Critical care time:  32 min       Erick Colace ACNP-BC [place] Pager # [phone] OR # [phone] if no answer  Attending Note:  58 year old female presenting with pneumonia and  respiratory failure.  On exam, she is using accessory muscles and clearly tiring out.  I reviewed CXR myself, RML infiltrate noted.  Discussed with EDP and PCCM-NP.  Will proceed with intubation.  Full mechanical support.  Abx ordered.  F/U on cultures.  Adjust vent for ABG.  Check RVP.  PCCM will admit and manage.  Husband updated bedside.  The patient is critically ill with multiple organ systems failure and requires high complexity decision making for assessment and support, frequent evaluation and titration of therapies, application of advanced monitoring technologies and extensive interpretation of multiple databases.   Critical Care Time devoted to patient care services described in this note is  38  Minutes. This time reflects time of care of this signee Dr Jennet Maduro. This critical care time does not reflect procedure time, or teaching time or supervisory time of PA/NP/Med student/Med Resident etc but could involve care discussion time.  Rush Farmer, M.D. [facility] Pulmonary/Critical Care Medicine. Pager: [phone]. After hours pager: [phone].

## 2018-08-29 NOTE — Progress Notes (Signed)
[place] Progress Note Patient Name: Anna [name] DOB: Jun 12, 1961 MRN: [id number]   Date of Service  08/29/2018  HPI/Events of Note  Anemia - Hgb = 5.8.  eICU Interventions  Will transfuse 2 units PRBC now.      Intervention Category Major Interventions: Other:  Lysle Dingwall 08/29/2018, 8:17 PM

## 2018-08-29 NOTE — Plan of Care (Signed)

## 2018-08-29 NOTE — Progress Notes (Signed)
Echocardiogram 2D Echocardiogram has been performed.  Anna [name] 08/29/2018, 10:00 AM

## 2018-08-29 NOTE — ED Triage Notes (Signed)
Pt BIB [facility] for a fever and altered mental status. EMS reports pt's husband awoke to find the pt semi-responsive and would not awaken for him. EMS advised the pt was tachypenic, tachycardic, hypotensive, and hypoxic. EMS advised the pt had a tympanic temp of 101. EMS advised they placed the pt on capnography and same read 15-53mmHg. EMS advised 12 lead unremarkable for ectopy and vital signs 105/58, p-134, r-48, 85% on RA, CBG-181. EMS advised a 20g in the right hand and oxygen at 15 liters by NRB.

## 2018-08-29 NOTE — Procedures (Signed)
Intubation Procedure Note Anna [name] [id number] Jul 01, 1961  Procedure: Intubation Indications: Respiratory insufficiency  Procedure Details Consent: Risks of procedure as well as the alternatives and risks of each were explained to the (patient/caregiver).  Consent for procedure obtained. Time Out: Verified patient identification, verified procedure, site/side was marked, verified correct patient position, special equipment/implants available, medications/allergies/relevent history reviewed, required imaging and test results available.  Performed  Maximum sterile technique was used including gloves, hand hygiene and mask.  MAC    Evaluation Hemodynamic Status: BP stable throughout; O2 sats: stable throughout Patient's Current Condition: stable Complications: No apparent complications Patient did tolerate procedure well. Chest X-ray ordered to verify placement.  CXR: ETT ok.   Jennet Maduro 08/29/2018

## 2018-08-30 ENCOUNTER — Inpatient Hospital Stay (HOSPITAL_COMMUNITY): Payer: BLUE CROSS/BLUE SHIELD

## 2018-08-30 DIAGNOSIS — J80 Acute respiratory distress syndrome: Secondary | ICD-10-CM

## 2018-08-30 DIAGNOSIS — D500 Iron deficiency anemia secondary to blood loss (chronic): Secondary | ICD-10-CM

## 2018-08-30 DIAGNOSIS — R8290 Unspecified abnormal findings in urine: Secondary | ICD-10-CM

## 2018-08-30 LAB — GLUCOSE, CAPILLARY
GLUCOSE-CAPILLARY: 149 mg/dL — AB (ref 70–99)
Glucose-Capillary: 107 mg/dL — ABNORMAL HIGH (ref 70–99)
Glucose-Capillary: 113 mg/dL — ABNORMAL HIGH (ref 70–99)
Glucose-Capillary: 114 mg/dL — ABNORMAL HIGH (ref 70–99)
Glucose-Capillary: 121 mg/dL — ABNORMAL HIGH (ref 70–99)
Glucose-Capillary: 123 mg/dL — ABNORMAL HIGH (ref 70–99)

## 2018-08-30 LAB — RESPIRATORY PANEL BY PCR
Adenovirus: NOT DETECTED
Bordetella pertussis: NOT DETECTED
Chlamydophila pneumoniae: NOT DETECTED
Coronavirus 229E: NOT DETECTED
Coronavirus HKU1: NOT DETECTED
Coronavirus NL63: NOT DETECTED
Coronavirus OC43: NOT DETECTED
Influenza A: NOT DETECTED
Influenza B: NOT DETECTED
METAPNEUMOVIRUS-RVPPCR: NOT DETECTED
Mycoplasma pneumoniae: NOT DETECTED
PARAINFLUENZA VIRUS 3-RVPPCR: NOT DETECTED
Parainfluenza Virus 1: NOT DETECTED
Parainfluenza Virus 2: NOT DETECTED
Parainfluenza Virus 4: NOT DETECTED
Respiratory Syncytial Virus: NOT DETECTED
Rhinovirus / Enterovirus: NOT DETECTED

## 2018-08-30 LAB — CBC
HEMATOCRIT: 29.1 % — AB (ref 36.0–46.0)
Hemoglobin: 10 g/dL — ABNORMAL LOW (ref 12.0–15.0)
MCH: 34.6 pg — ABNORMAL HIGH (ref 26.0–34.0)
MCHC: 34.4 g/dL (ref 30.0–36.0)
MCV: 100.7 fL — ABNORMAL HIGH (ref 80.0–100.0)
Platelets: 178 10*3/uL (ref 150–400)
RBC: 2.89 MIL/uL — ABNORMAL LOW (ref 3.87–5.11)
RDW: 22.4 % — ABNORMAL HIGH (ref 11.5–15.5)
WBC: 9.9 10*3/uL (ref 4.0–10.5)
nRBC: 0 % (ref 0.0–0.2)

## 2018-08-30 LAB — TYPE AND SCREEN
ABO/RH(D): A NEG
ANTIBODY SCREEN: NEGATIVE
Unit division: 0
Unit division: 0

## 2018-08-30 LAB — BASIC METABOLIC PANEL
ANION GAP: 14 (ref 5–15)
BUN: 59 mg/dL — ABNORMAL HIGH (ref 6–20)
CO2: 14 mmol/L — ABNORMAL LOW (ref 22–32)
Calcium: 7.8 mg/dL — ABNORMAL LOW (ref 8.9–10.3)
Chloride: 112 mmol/L — ABNORMAL HIGH (ref 98–111)
Creatinine, Ser: 3.28 mg/dL — ABNORMAL HIGH (ref 0.44–1.00)
GFR calc Af Amer: 17 mL/min — ABNORMAL LOW (ref >60)
GFR calc non Af Amer: 15 mL/min — ABNORMAL LOW (ref >60)
Glucose, Bld: 122 mg/dL — ABNORMAL HIGH (ref 70–99)
Potassium: 5 mmol/L (ref 3.5–5.1)
Sodium: 140 mmol/L (ref 135–145)

## 2018-08-30 LAB — POCT I-STAT 7, (LYTES, BLD GAS, ICA,H+H)
Acid-base deficit: 12 mmol/L — ABNORMAL HIGH (ref 0.0–2.0)
Acid-base deficit: 10 mmol/L — ABNORMAL HIGH (ref 0.0–2.0)
Bicarbonate: 15.1 mmol/L — ABNORMAL LOW (ref 20.0–28.0)
Bicarbonate: 15.4 mmol/L — ABNORMAL LOW (ref 20.0–28.0)
Calcium, Ion: 1.1 mmol/L — ABNORMAL LOW (ref 1.15–1.40)
Calcium, Ion: 1.07 mmol/L — ABNORMAL LOW (ref 1.15–1.40)
HCT: 28 % — ABNORMAL LOW (ref 36.0–46.0)
HCT: 28 % — ABNORMAL LOW (ref 36.0–46.0)
Hemoglobin: 9.5 g/dL — ABNORMAL LOW (ref 12.0–15.0)
Hemoglobin: 9.5 g/dL — ABNORMAL LOW (ref 12.0–15.0)
O2 Saturation: 92 %
O2 Saturation: 100 %
PCO2 ART: 34.4 mmHg (ref 32.0–48.0)
PH ART: 7.279 — AB (ref 7.350–7.450)
POTASSIUM: 5 mmol/L (ref 3.5–5.1)
Potassium: 4.9 mmol/L (ref 3.5–5.1)
Sodium: 140 mmol/L (ref 135–145)
Sodium: 140 mmol/L (ref 135–145)
TCO2: 16 mmol/L — ABNORMAL LOW (ref 22–32)
TCO2: 16 mmol/L — ABNORMAL LOW (ref 22–32)
pCO2 arterial: 32.8 mmHg (ref 32.0–48.0)
pH, Arterial: 7.245 — ABNORMAL LOW (ref 7.350–7.450)
pO2, Arterial: 68 mmHg — ABNORMAL LOW (ref 83.0–108.0)
pO2, Arterial: 200 mmHg — ABNORMAL HIGH (ref 83.0–108.0)

## 2018-08-30 LAB — BPAM RBC
Blood Product Expiration Date: 202003072359
Blood Product Expiration Date: 202003072359
ISSUE DATE / TIME: 202002232046
ISSUE DATE / TIME: 202002232249
Unit Type and Rh: 600
Unit Type and Rh: 600

## 2018-08-30 LAB — URINE CULTURE: Culture: NO GROWTH

## 2018-08-30 LAB — URINALYSIS, COMPLETE (UACMP) WITH MICROSCOPIC
Glucose, UA: NEGATIVE mg/dL
Ketones, ur: NEGATIVE mg/dL
Leukocytes,Ua: NEGATIVE
Nitrite: NEGATIVE
Protein, ur: 100 mg/dL — AB
pH: 5.5 (ref 5.0–8.0)

## 2018-08-30 LAB — LEGIONELLA PNEUMOPHILA SEROGP 1 UR AG: L. pneumophila Serogp 1 Ur Ag: POSITIVE — AB

## 2018-08-30 LAB — PHOSPHORUS: Phosphorus: 8 mg/dL — ABNORMAL HIGH (ref 2.5–4.6)

## 2018-08-30 LAB — SODIUM, URINE, RANDOM: Sodium, Ur: <10 mmol/L

## 2018-08-30 LAB — CREATININE, URINE, RANDOM: Creatinine, Urine: 219.61 mg/dL

## 2018-08-30 LAB — MAGNESIUM: Magnesium: 1.8 mg/dL (ref 1.7–2.4)

## 2018-08-30 LAB — PROCALCITONIN: PROCALCITONIN: 15.47 ng/mL

## 2018-08-30 MED ORDER — SODIUM CHLORIDE 0.9 % IV SOLN
500.0000 mg | INTRAVENOUS | Status: DC
  Administered 2018-08-30: 500 mg via INTRAVENOUS
  Filled 2018-08-30 (×2): qty 500

## 2018-08-30 MED ORDER — VITAL HIGH PROTEIN PO LIQD
1000.0000 mL | ORAL | Status: DC
  Administered 2018-08-30 – 2018-09-02 (×5): 1000 mL

## 2018-08-30 MED ORDER — SODIUM CHLORIDE 0.9 % IV SOLN
INTRAVENOUS | Status: DC | PRN
  Administered 2018-08-30: 500 mL via INTRAVENOUS
  Administered 2018-09-01: 490 mL via INTRAVENOUS
  Administered 2018-09-03: 500 mL via INTRAVENOUS

## 2018-08-30 NOTE — Progress Notes (Signed)
NAME:  FRANCIS YARDLEY, MRN:  [id number], DOB:  Jan 16, 1961, LOS: 1 ADMISSION DATE:  08/29/2018, CONSULTATION DATE:  2/23 REFERRING MD:  Venora Maples, CHIEF COMPLAINT:  Acute hypoxic respiratory failure and Pneumonia   Brief History   58 year old female patient on Imuran, Plaquenil, and prednisone for history of lupus.  Admitted 2/23 with pneumonia and respiratory failure requiring noninvasive positive pressure ventilation.  Admitting to intensive care given high risk for clinical decompensation  History of present illness   58 year old female with history of lupus, on Imuran, Plaquenil, and prednisone at baseline, seen in our office 2/18 for routine pulmonary follow-up, no acute pulmonary symptoms at that point.  Presented to the emergency room on 2/23 with 24-hour history of cough, myalgias, worsening shortness of breath, and altered sensorium.  EMS called, temperature 101.  On arrival found to be tachypneic, hypoxic With room air saturations 85%, and also new marked right-sided infiltrate.  Was placed on noninvasive positive pressure ventilation with improvement in work of breathing however did remain tachypneic and tachycardic.  Pulmonary admitting to intensive care given high risk of clinical decompensation  Past Medical History  Lupus on Imuran and Plaquenil as well as prednisone 5 mg daily.  Lupus primarily affecting joints, no evidence of pulmonary involvement as of yet.  Pulmonary hypertension, tobacco abuse, hypertension, hyperlipidemia, depression, prior CVA, admitted in February 2018 for pneumonia with parapneumonic effusion, also just discharged from hospitalization in January 2024 benzodiazepine overdose  Significant Hospital Events   2/23: Presented the emergency room With 24-hour history of cough, myalgias, fever as high as 101 and altered sensorium chest x-ray demonstrating pulmonary infiltrate on the right, hypoxic and tachypneic requiring BiPAP support critical care admitting  Consults:     Procedures:    Significant Diagnostic Tests:    Micro Data:  Respiratory viral panel 2/23>>>Pending Flu A and B 2/23>>>Negative Urine strep antigen 2/23>>>Pending Urine Legionella antigen 2/23>>>Pending Sputum culture 2/23>>> Blood culture 2/23>>  Antimicrobials:  Vancomycin 2/23>>> Zosyn 2/23>>> Levaquin 2/23>>>  Interim history/subjective:  Transfused 2 units of pRBC Decreased UOP ABG improving but remains poor  Objective   Blood pressure 94/75, pulse 66, temperature (!) 96.8 F (36 C), resp. rate 16, height 5\' 6"  (1.676 m), weight 87.2 kg, last menstrual period 07/07/2002, SpO2 98 %.    Vent Mode: PRVC FiO2 (%):  [40 %-100 %] 100 % Set Rate:  [14 bmp] 14 bmp Vt Set:  [470 mL] 470 mL PEEP:  [12 cmH20] 12 cmH20 Pressure Support:  [12 cmH20] 12 cmH20 Plateau Pressure:  [15 cmH20-24 cmH20] 15 cmH20   Intake/Output Summary (Last 24 hours) at 08/30/2018 0816 Last data filed at 08/30/2018 0700 Gross per 24 hour  Intake 5075.14 ml  Output -  Net 5075.14 ml   Filed Weights   08/29/18 0740 08/30/18 0355  Weight: 78.4 kg 87.2 kg    Examination: General: Acute on chronically ill appearing female, on the vent, intubated HENT: [place]/AT, PERRL, EOM-I and MMM Lungs: Diffuse crackles Cardiovascular: RRR, Nl S1/S2 and -M/R/G Abdomen: Soft, NT, ND and +BS Extremities: Trace lower extremity edema brisk cap refill strong pulses Neuro: Sedate, withdraws all ext to command Skin: Intact  I reviewed CXR myself, ETT ok but worsening infiltrate compared to yesterday's  Resolved Hospital Problem list     Assessment & Plan:  Acute hypoxic respiratory failure in the setting of pneumonia.  She is immunocompromised on Imuran and prednisone Portable chest x-ray personally reviewed this demonstrates severe right sided infiltrate Plan Continue full vent  support Increase PEEP to 16 Change to PCV with rate of 24 and PS above PEEP of 14 Permissive hypercapnea but increase  rate Panculture, send urinary antigens, and respiratory viral panel Hold Imuran KVO IVF Hold diurese Broad-spectrum antibiotics including nosocomial coverage given recent hospitalization in January, also adding Levaquin for atypical coverage If intubated would be reasonable to get bronchoscopy  Severe sepsis secondary to pneumonia -Was volume responsive in the emergency room however did present with systolic pressure in the 06T.  Lactic acid was normal Plan KVO IVF Stress dose steroids given chronic prednisone Antibiotics as mentioned above  Pulmonary hypertension Plan Telemetry monitoring Avoiding hypoxia  Acute metabolic encephalopathy in the setting of sepsis and hypoxia Plan Holding sedating medications Supportive care She is chronically on Seroquel, as well as Lexapro and as needed Valium.  We will need to reevaluate this daily, but holding for now  Acute on chronic renal failure stage III; appears as though baseline serum creatinine around 1.9 Plan [facility] IVF Nephrology consult called as I suspect will need HD before this is over Renal dose medications Strict intake output Follow-up blood chemistry  Fluid electrolyte balance/acid-base imbalance: Mild hyperchloremia, non-anion gap metabolic acidosis Plan KVO IVF Serial chemistries  Macrocytic anemia anemia, 1 month ago hemoglobin 7.9, currently 7.1, she is followed by hematology at [facility], felt secondary to her lupus Plan Trending CBC Transfuse for hemoglobin less than 7  Hilar lung mass Plan Follow-up outpatient PET scan  Hypothyroidism Plan Continue supplementation  Nicotine dependence Plan Smoking cessation  History of lupus Plan Holding Imuran Follow-up rheumatology   Best practice:  Diet: N.p.o. Pain/Anxiety/Delirium protocol (if indicated): Not applicable VAP protocol (if indicated): Not applicable DVT prophylaxis: Subcutaneous heparin GI prophylaxis: PPI Glucose control: Sliding scale  insulin Mobility: Bedrest Code Status: Full code Family Communication: Pending Disposition: Critically ill due to high risk of progressive respiratory failure admitting to intensive care for close observation, and high concern about need for intubation  Labs   CBC: Recent Labs  Lab 08/29/18 0747 08/29/18 0802 08/29/18 1604 08/30/18 0458 08/30/18 0609  WBC 12.1*  --   --  9.9  --   NEUTROABS 11.1*  --   --   --   --   HGB 7.1* 7.1* 5.8* 10.0* 9.5*  HCT 21.2* 21.0* 17.0* 29.1* 28.0*  MCV 112.2*  --   --  100.7*  --   PLT 213  --   --  178  --     Basic Metabolic Panel: Recent Labs  Lab 08/29/18 0747 08/29/18 0802 08/29/18 1604 08/30/18 0458 08/30/18 0609  NA 139 140 141 140 140  K 4.0 4.0 4.6 5.0 5.0  CL 112*  --   --  112*  --   CO2 15*  --   --  14*  --   GLUCOSE 120*  --   --  122*  --   BUN 47*  --   --  59*  --   CREATININE 3.13*  --   --  3.28*  --   CALCIUM 8.2*  --   --  7.8*  --   MG  --   --   --  1.8  --   PHOS  --   --   --  8.0*  --    GFR: Estimated Creatinine Clearance: 20.6 mL/min (A) (by C-G formula based on SCr of 3.28 mg/dL (H)). Recent Labs  Lab 08/29/18 0747 08/29/18 0160 08/29/18 0941 08/29/18 0943 08/30/18 0458  PROCALCITON  --   --  5.92  --  15.47  WBC 12.1*  --   --   --  9.9  LATICACIDVEN  --  1.4  --  1.3  --     Liver Function Tests: Recent Labs  Lab 08/29/18 0747  AST 15  ALT 11  ALKPHOS 69  BILITOT 0.5  PROT 6.0*  ALBUMIN 2.2*   No results for input(s): LIPASE, AMYLASE in the last 168 hours. No results for input(s): AMMONIA in the last 168 hours.  ABG    Component Value Date/Time   PHART 7.245 (L) 08/30/2018 0609   PCO2ART 34.4 08/30/2018 0609   PO2ART 68.0 (L) 08/30/2018 0609   HCO3 15.1 (L) 08/30/2018 0609   TCO2 16 (L) 08/30/2018 0609   ACIDBASEDEF 12.0 (H) 08/30/2018 0609   O2SAT 92.0 08/30/2018 0609     Coagulation Profile: Recent Labs  Lab 08/29/18 0747  INR 1.46    Cardiac Enzymes: Recent  Labs  Lab 08/29/18 0941  TROPONINI 0.07*    HbA1C: No results found for: HGBA1C  CBG: Recent Labs  Lab 08/29/18 1449 08/29/18 1941 08/29/18 2359 08/30/18 0450 08/30/18 0736  GLUCAP 145* 100* 114* 113* 121*      The patient is critically ill with multiple organ systems failure and requires high complexity decision making for assessment and support, frequent evaluation and titration of therapies, application of advanced monitoring technologies and extensive interpretation of multiple databases.   Critical Care Time devoted to patient care services described in this note is  33  Minutes. This time reflects time of care of this signee Dr Jennet Maduro. This critical care time does not reflect procedure time, or teaching time or supervisory time of PA/NP/Med student/Med Resident etc but could involve care discussion time.  Rush Farmer, M.D. [facility] Pulmonary/Critical Care Medicine. Pager: [phone]. After hours pager: [phone].

## 2018-08-30 NOTE — Consult Note (Addendum)
Reason for Consult: AKI/CKD stage 3 Referring Physician: Nelda Marseille, MD  Anna [name] is an 58 y.o. female.  HPI: Ms. Kearl is a 58 yo AAF with PMH significant for HTN, pulmonary HTN, h/oCVA, tobacco abuse, and SLE (on chronic immunosuppression with imuran, plaquenil, and prednisone) who was admitted on 08/29/18 with VDRF due to pneumonia.  She was noted to have cough, myalgias, worsening SOB and AMS 24 hours prior to admission.  EMS was called and she was noted to be febrile at 101, tachypneic, and hypoxic.  CXR revealed right sided pulmonary infiltrate and admitted to ICU for intubation and clinical decline.  We were asked to see the patient due to the development of oliguric AKI/CKD stage 3.  The trend in Scr is seen below.   No history of IV contrast or ACE/ARB therapy.  Unclear if she had been taking NSAIDs or Cox-II I's prior to admission.   Trend in Creatinine: Creatinine, Ser  Date/Time Value Ref Range Status  08/30/2018 04:58 AM 3.28 (H) 0.44 - 1.00 mg/dL Final  08/29/2018 07:47 AM 3.13 (H) 0.44 - 1.00 mg/dL Final  07/30/2018 07:05 AM 1.92 (H) 0.44 - 1.00 mg/dL Final  07/29/2018 05:33 AM 2.13 (H) 0.44 - 1.00 mg/dL Final  07/28/2018 07:51 AM 2.08 (H) 0.44 - 1.00 mg/dL Final  07/27/2018 05:44 AM 2.04 (H) 0.44 - 1.00 mg/dL Final  07/25/2018 06:37 AM 1.88 (H) 0.44 - 1.00 mg/dL Final  07/24/2018 10:05 AM 2.14 (H) 0.44 - 1.00 mg/dL Final  07/23/2018 10:32 AM 2.80 (H) 0.44 - 1.00 mg/dL Final  07/23/2018 09:54 AM 2.57 (H) 0.44 - 1.00 mg/dL Final  06/18/2018 03:48 AM 1.59 (H) 0.44 - 1.00 mg/dL Final  06/17/2018 04:26 AM 1.80 (H) 0.44 - 1.00 mg/dL Final  06/15/2018 04:38 PM 2.19 (H) 0.44 - 1.00 mg/dL Final  02/13/2018 04:50 AM 1.52 (H) 0.44 - 1.00 mg/dL Final  02/12/2018 12:13 PM 2.04 (H) 0.44 - 1.00 mg/dL Final  02/02/2018 10:17 PM 1.65 (H) 0.44 - 1.00 mg/dL Final  08/16/2016 05:26 AM 1.37 (H) 0.44 - 1.00 mg/dL Final  08/14/2016 03:18 AM 1.39 (H) 0.44 - 1.00 mg/dL Final  08/13/2016  06:51 AM 1.58 (H) 0.44 - 1.00 mg/dL Final  08/12/2016 03:54 AM 1.78 (H) 0.44 - 1.00 mg/dL Final  08/11/2016 01:41 AM 1.62 (H) 0.44 - 1.00 mg/dL Final  08/10/2016 12:59 AM 1.33 (H) 0.44 - 1.00 mg/dL Final  08/09/2016 03:30 AM 1.23 (H) 0.44 - 1.00 mg/dL Final  08/08/2016 06:16 AM 1.11 (H) 0.44 - 1.00 mg/dL Final  08/07/2016 05:21 AM 1.24 (H) 0.44 - 1.00 mg/dL Final  08/06/2016 05:46 AM 1.39 (H) 0.44 - 1.00 mg/dL Final  08/05/2016 09:01 PM 1.40 (H) 0.44 - 1.00 mg/dL Final  08/05/2016 04:05 AM 1.54 (H) 0.44 - 1.00 mg/dL Final  08/04/2016 04:23 PM 1.58 (H) 0.44 - 1.00 mg/dL Final  05/11/2015 08:12 PM 1.01 (H) 0.44 - 1.00 mg/dL Final  12/01/2012 04:41 PM 1.10 0.50 - 1.10 mg/dL Final  12/12/2010 11:36 AM 1.10 0.4 - 1.2 mg/dL Final  11/22/2007 06:42 PM 1.1  Final  03/04/2007 07:41 AM 0.74  Final    PMH:   Past Medical History:  Diagnosis Date  . Acute CHF ([place]) 07/2016  . Depressive disorder, not elsewhere classified   . Family history of adverse reaction to anesthesia    mother passed away during surgery  . Hypertension   . Hypothyroid    Dr. Wilson Singer  . Kidney stone   . Lupus (systemic lupus erythematosus) (  HCC)   . Migraine   . Pure hypercholesterolemia   . Stroke [facility])     PSH:   Past Surgical History:  Procedure Laterality Date  . CESAREAN SECTION    . ECTOPIC PREGNANCY SURGERY     times 2  . JOINT REPLACEMENT    . LAPAROSCOPY     with rt salpingectomy  . [place] SURGERY  2003  . TONSILLECTOMY    . TOTAL ABDOMINAL HYSTERECTOMY  2004   ovaries retained, DUB  . TUBAL LIGATION  1985    Allergies: No Known Allergies  Medications:   Prior to Admission medications   Medication Sig Start Date End Date Taking? Authorizing Provider  aspirin EC 325 MG tablet Take 325 mg by mouth daily. 08/13/18 08/13/19 Yes [provider]  atorvastatin (LIPITOR) 20 MG tablet Take 20 mg by mouth daily.   Yes [provider]  azaTHIOprine (IMURAN) 50 MG tablet Take 50 mg by  mouth 2 (two) times daily.  02/20/17  Yes [provider]  calcitRIOL (ROCALTROL) 0.25 MCG capsule Take 0.25 mcg by mouth daily. 04/21/18  Yes [provider]  diazepam (VALIUM) 10 MG tablet Take 0.5 tablets (5 mg total) by mouth every 8 (eight) hours as needed for anxiety. 07/30/18  Yes Shelly Coss, MD  diphenhydrAMINE (BENADRYL) 25 MG tablet Take 25 mg by mouth at bedtime.   Yes [provider]  escitalopram (LEXAPRO) 10 MG tablet Take 3 tablets (30 mg total) by mouth daily. 07/20/18  Yes Donnal Moat T, PA-C  ferrous sulfate 325 (65 FE) MG tablet Take 1 tablet (325 mg total) by mouth daily. 07/30/18 07/30/19 Yes Shelly Coss, MD  folic acid (FOLVITE) 1 MG tablet Take 1 mg by mouth daily.   Yes [provider]  hydrALAZINE (APRESOLINE) 50 MG tablet Take 1 tablet (50 mg total) by mouth every 8 (eight) hours. 07/30/18  Yes Shelly Coss, MD  hydroxychloroquine (PLAQUENIL) 200 MG tablet Take 200 mg by mouth daily.  02/27/17  Yes [provider]  levothyroxine (SYNTHROID, LEVOTHROID) 50 MCG tablet Take 50 mcg by mouth daily before breakfast.   Yes [provider]  losartan (COZAAR) 100 MG tablet Take 100 mg by mouth daily. 08/13/18  Yes [provider]  metoprolol tartrate (LOPRESSOR) 50 MG tablet Take 50 mg by mouth daily. 08/11/18  Yes [provider]  NIFEdipine (ADALAT CC) 90 MG 24 hr tablet Take 1 tablet (90 mg total) by mouth daily. 08/02/18  Yes Ghimire, Henreitta Leber, MD  omeprazole (PRILOSEC) 20 MG capsule Take 20 mg by mouth daily.   Yes [provider]  ondansetron (ZOFRAN) 4 MG tablet Take 1 tablet (4 mg total) by mouth every 6 (six) hours as needed for nausea. 02/13/18  Yes Mariel Aloe, MD  polyethylene glycol (MIRALAX / GLYCOLAX) packet Take 17 g by mouth 2 (two) times daily. Patient taking differently: Take 17 g by mouth daily as needed (constipation).  02/13/18  Yes Mariel Aloe, MD  predniSONE (DELTASONE)  10 MG tablet Take 20 mg for daily 1 day, then, Take 10 mg for daily 1 day, then, Resume 5 mg daily for 1 day and stay on it 08/02/18  Yes Ghimire, Henreitta Leber, MD  QUEtiapine (SEROQUEL) 400 MG tablet Take 1 tablet (400 mg total) by mouth at bedtime. 07/20/18  Yes Hurst, Dorothea Glassman, PA-C  ranitidine (ZANTAC 75) 75 MG tablet Take 75 mg by mouth 2 (two) times daily as needed for indigestion.  Yes [provider]  sodium bicarbonate 650 MG tablet Take 1,300 mg by mouth 2 (two) times daily.   Yes [provider]  vitamin B-12 (CYANOCOBALAMIN) 1000 MCG tablet Take 1,000 mcg by mouth daily.   Yes [provider]  metoprolol succinate (TOPROL-XL) 50 MG 24 hr tablet Take 50 mg by mouth daily. Take with or immediately following a meal.    [provider]  potassium chloride 20 MEQ TBCR Take 20 mEq by mouth daily for 5 days. 07/31/18 08/05/18  Shelly Coss, MD    Inpatient medications: . chlorhexidine gluconate (MEDLINE KIT)  15 mL Mouth Rinse BID  . folic acid  1 mg Oral Daily  . heparin  5,000 Units Subcutaneous Q8H  . hydrocortisone sod succinate (SOLU-CORTEF) inj  50 mg Intravenous Q6H  . hydroxychloroquine  200 mg Oral Daily  . insulin aspart  0-15 Units Subcutaneous Q4H  . levothyroxine  50 mcg Oral Q0600  . mouth rinse  15 mL Mouth Rinse 10 times per day  . midazolam  2 mg Intravenous Once  . pantoprazole (PROTONIX) IV  40 mg Intravenous QHS  . vitamin B-12  1,000 mcg Oral Daily    Discontinued Meds:   Medications Discontinued During This Encounter  Medication Reason  . propofol (DIPRIVAN) 10 mg/mL bolus/IV push Returned to ADS  . dexmedetomidine (PRECEDEX) 200 MCG/50ML (4 mcg/mL) infusion   . dexmedetomidine (PRECEDEX) 200 MCG/50ML (4 mcg/mL) infusion   . lactated ringers infusion   . aspirin EC 81 MG tablet Dose change  . amLODipine (NORVASC) 10 MG tablet Discontinued by provider  . levofloxacin (LEVAQUIN) IVPB 500 mg     Social History:  reports  that she has been smoking cigarettes. She has a 17.50 pack-year smoking history. She has never used smokeless tobacco. She reports current alcohol use. She reports that she does not use drugs.  Family History:   Family History  Problem Relation Age of Onset  . Diabetes Mother   . Hypertension Sister   . Diabetes Maternal Grandmother   . Autoimmune disease Neg Hx     Review of systems not obtained due to patient factors. Weight change:   Intake/Output Summary (Last 24 hours) at 08/30/2018 1214 Last data filed at 08/30/2018 0700 Gross per 24 hour  Intake 3075.14 ml  Output -  Net 3075.14 ml   BP 102/73   Pulse 73   Temp (!) 96.8 F (36 C)   Resp (!) 24   Ht '5\' 6"'$  (1.676 m)   Wt 87.2 kg   LMP 07/07/2002 (Exact Date)   SpO2 100%   BMI 31.03 kg/m  Vitals:   08/30/18 0630 08/30/18 0700 08/30/18 0737 08/30/18 1158  BP: '92/71 92/72 94/75 '$ 102/73  Pulse: 64 66 66 73  Resp: '15 17 16 '$ (!) 24  Temp: (!) 96.4 F (35.8 C) (!) 96.8 F (36 C)    TempSrc:      SpO2: 96% 97% 98% 100%  Weight:      Height:         General appearance: critically ill, intubated and sedated Head: Normocephalic, without obvious abnormality, atraumatic Resp: rales bilaterally Cardio: regular rate and rhythm and no rub GI: soft, non-tender; bowel sounds normal; no masses,  no organomegaly Extremities: edema trace Neuro: intubated and sedated  Labs: Basic Metabolic Panel: Recent Labs  Lab 08/29/18 0747 08/29/18 0802 08/29/18 1604 08/30/18 0458 08/30/18 0609 08/30/18 1033  NA 139 140 141 140 140 140  K 4.0 4.0 4.6  5.0 5.0 4.9  CL 112*  --   --  112*  --   --   CO2 15*  --   --  14*  --   --   GLUCOSE 120*  --   --  122*  --   --   BUN 47*  --   --  59*  --   --   CREATININE 3.13*  --   --  3.28*  --   --   ALBUMIN 2.2*  --   --   --   --   --   CALCIUM 8.2*  --   --  7.8*  --   --   PHOS  --   --   --  8.0*  --   --    Liver Function Tests: Recent Labs  Lab 08/29/18 0747  AST 15  ALT  11  ALKPHOS 69  BILITOT 0.5  PROT 6.0*  ALBUMIN 2.2*   No results for input(s): LIPASE, AMYLASE in the last 168 hours. No results for input(s): AMMONIA in the last 168 hours. CBC: Recent Labs  Lab 08/29/18 0747  08/29/18 1604 08/30/18 0458 08/30/18 0609 08/30/18 1033  WBC 12.1*  --   --  9.9  --   --   NEUTROABS 11.1*  --   --   --   --   --   HGB 7.1*   < > 5.8* 10.0* 9.5* 9.5*  HCT 21.2*   < > 17.0* 29.1* 28.0* 28.0*  MCV 112.2*  --   --  100.7*  --   --   PLT 213  --   --  178  --   --    < > = values in this interval not displayed.   PT/INR: '@LABRCNTIP'$ (inr:5) Cardiac Enzymes: ) Recent Labs  Lab 08/29/18 0941  TROPONINI 0.07*   CBG: Recent Labs  Lab 08/29/18 1941 08/29/18 2359 08/30/18 0450 08/30/18 0736 08/30/18 1133  GLUCAP 100* 114* 113* 121* 107*    Iron Studies:  Recent Labs  Lab 08/29/18 0941  IRON 9*  TIBC 126*  FERRITIN 331*    Xrays/Other Studies: Dg Chest Port 1 View  Result Date: 08/30/2018 CLINICAL DATA:  Intubated, pneumonia EXAM: PORTABLE CHEST 1 VIEW COMPARISON:  Chest radiograph from one day prior. FINDINGS: Endotracheal tube tip is 3.7 cm above the carina. Enteric tube enters stomach with the tip not seen on this image. Stable cardiomediastinal silhouette with top-normal heart size. No pneumothorax. Probable small left pleural effusion, stable. No significant right pleural effusion. Stable extensive patchy consolidation in the right perihilar and bilateral retrocardiac lungs. IMPRESSION: 1. Well-positioned support structures. 2. Stable small left pleural effusion. 3. Stable extensive patchy right perihilar and bilateral retrocardiac consolidation compatible with a combination of multilobar pneumonia and atelectasis. Electronically Signed   By: Ilona Sorrel M.D.   On: 08/30/2018 09:09   Dg Chest Portable 1 View  Result Date: 08/29/2018 CLINICAL DATA:  Status post repositioning of endotracheal tube. EXAM: PORTABLE CHEST 1 VIEW COMPARISON:   Single-view of the chest approximately 6 minutes prior to this exam. FINDINGS: Endotracheal tube has been pulled back with its tip now in good position 2.5 cm above the carina. No other change. IMPRESSION: ET tube now in good position.  No other change. Electronically Signed   By: Inge Rise M.D.   On: 08/29/2018 13:37   Dg Chest Portable 1 View  Result Date: 08/29/2018 CLINICAL DATA:  Status post endotracheal tube placement. EXAM:  PORTABLE CHEST 1 VIEW COMPARISON:  Single-view of the chest earlier today. FINDINGS: New NG tube is in place with its tip in the stomach. Endotracheal tube tip is approximately 0.9 cm above the carina. Extensive airspace disease in the right chest is unchanged. Cardiomegaly noted. IMPRESSION: NG tube tip is 0.9 cm above the carina. The tube should be withdrawn 1-2 cm. NG tube in good position. No change in airspace disease on the right. Electronically Signed   By: Inge Rise M.D.   On: 08/29/2018 13:36   Dg Chest Port 1 View  Result Date: 08/29/2018 CLINICAL DATA:  Fever and altered mental status. EXAM: PORTABLE CHEST 1 VIEW COMPARISON:  07/27/2018 FINDINGS: The cardiac silhouette is enlarged and globular, with more prominent size than on the prior radiograph dated 07/27/2018. These findings are concerning for a pericardial effusion. There is no evidence of pneumothorax. Right mid lung confluent airspace opacity. Small right pleural effusion. Osseous structures are without acute abnormality. Soft tissues are grossly normal. IMPRESSION: 1. Enlarged and globular cardiac silhouette, with more prominent size than on the prior radiograph dated 07/27/2014. These findings are concerning for a pericardial effusion. 2. Right mid lung confluent airspace opacity likely represents developing pneumonia. 3. Small right pleural effusion. 4. Critical Value/emergent results were called by telephone at the time of interpretation on 08/29/2018 at 8:02 am to Dr. Jola Schmidt , who verbally  acknowledged these results. Electronically Signed   By: Fidela Salisbury M.D.   On: 08/29/2018 08:02   Dg Abd Portable 1 View  Result Date: 08/29/2018 CLINICAL DATA:  OG tube placement. EXAM: PORTABLE ABDOMEN - 1 VIEW COMPARISON:  None. FINDINGS: 1252 hours. Diffuse gaseous bowel distention noted. NG tube tip overlies the proximal stomach. Telemetry leads evident. IMPRESSION: NG tube tip overlies the proximal stomach with diffuse gaseous bowel distention. Electronically Signed   By: Misty Stanley M.D.   On: 08/29/2018 13:36     Assessment/Plan: 1.  AKI/CKD stage 3 in setting of sepsis related to pneumonia.  She was hypotensive overnight and likely ischemic ATN.  She has received 5 liters of LR over the past 24 hours and has not had any significant UOP.   1. Will check renal US and bladder scan as she may need foley catheter. 2. No indication for HD or CVVHD at this time and will continue to follow closely. 3. UA with some blood and protein, no history of kidney involvement of lupus in the past.  Will check complements and repeat UA.  2. Sepsis due to pneumonia in immunocompromised patient.  Not yet on pressors 3. VDRF- per PCCM 4. Metabolic adisosis - due to #1.  Will start on isotonic bicarb and follow.  Vent adjustments per PCCM 5. Pulmonary HTN- per PCCM 6. AMS- likely due to sepsis and hyoxia 7. Anemia- transfuse prn.  Felt to be related to SLE 8. 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 9. SLE- agree with stress dose steroids and holding imuran.    Governor Rooks Rayelynn Loyal 08/30/2018, 12:14 PM

## 2018-08-30 NOTE — Progress Notes (Signed)
Patient with OGT present. Placement checked via auscultation and measuring external length of tubing. Tube feed had been started this morning at 11am. At 1220, patient's esophageal probe was noted to be measuring temp inaccurately so it was removed. At 1400, RN entered patient's room to see a large amount of tube feed pooling in her mouth and on the pillow. Patient's mouth immediately suctioned. ETT suctioned, no TF noted. O2 sats and vitals remained stable. Patient does not appear in distress and already on multiple antibiotics. Will continue to monitor for signs of aspiration.  Joellen Jersey, RN

## 2018-08-30 NOTE — Progress Notes (Signed)
Initial Nutrition Assessment  DOCUMENTATION CODES:   Obesity unspecified  INTERVENTION:   Tube feeding regimen as follows: Vital High Protein at 60 mL/hr via OG tube - this provides 1440 mL formula, 1440 kcal, 126 g protein, and 1210 mL free water - this meets 100% of estimated energy and protein needs - no additional water flushes needed as pt is receiving IV fluids  NUTRITION DIAGNOSIS:   Inadequate oral intake related to inability to eat, other (see comment) (on vent) as evidenced by NPO status.   GOAL:   Patient will meet greater than or equal to 90% of their needs   MONITOR:   Vent status, Labs, Weight trends, TF tolerance  REASON FOR ASSESSMENT:   Ventilator   ASSESSMENT:   58 yo female, admitted with acute respiratory failure and PNA. PMH significant for lupus, acute CHF, HTN, hypothyroid, h/o stroke, recent hospitalization in January for acute encephalopathy and AKI.  Labs: chloride 112, glucose 122, BUN 59, Creatinine 3.28, ionized Ca 1.10, phosphorus 8.0 Meds: folvite, novolog, levothyroxine, protonix, vitamin B-12  Patient is currently intubated on ventilator support MV: 12.3 L/min Temp (24hrs), Avg:97.5 F (36.4 C), Min:96.4 F (35.8 C), Max:99.5 F (37.5 C)  Pt sedated. Limited physical exam - pt appears well-nourished. TF running at 60 mL/hr at time of visit. Will continue to monitor for tolerance.   NUTRITION - FOCUSED PHYSICAL EXAM:   Most Recent Value  Orbital Region  Unable to assess  Upper Arm Region  No depletion  Thoracic and Lumbar Region  Unable to assess  Buccal Region  Unable to assess  Temple Region  No depletion  Clavicle Bone Region  No depletion  Clavicle and Acromion Bone Region  No depletion  Scapular Bone Region  Unable to assess  Dorsal Hand  Unable to assess  Patellar Region  Unable to assess  Anterior Thigh Region  Unable to assess  Posterior Calf Region  Unable to assess  Edema (RD Assessment)  Unable to assess  Hair   Reviewed  Eyes  Unable to assess  Mouth  Unable to assess  Skin  Reviewed  Nails  Unable to assess      Diet Order:  No known food allergies Diet Order            Diet NPO time specified Except for: Sips with Meds  Diet effective now             NG/OG tube in place  EDUCATION NEEDS:  Not appropriate for education at this time  Skin:  Skin Assessment: Reviewed RN Assessment  Last BM:  PTA  Height:  Ht Readings from Last 1 Encounters:  08/29/18 5\' 6"  (1.676 m)    Weight:  Wt Readings from Last 10 Encounters:  08/30/18 87.2 kg  08/24/18 78.4 kg  07/24/18 80 kg  06/16/18 81.2 kg  02/13/18 77.6 kg  02/02/18 78 kg  07/20/17 84.6 kg  Noted wt fluctuations x 1 year 8.8 kg wt gain x 1 week, noted pt is net +5.1 L since admit   Ideal Body Weight:  59.1 kg  BMI:  Body mass index is 31.03 kg/m. obese  Estimated Nutritional Needs:   Kcal:  1221-1570 (14-18 kcal/kg)  Protein:  >118 gm (>2.0 g/kg IBW)  Fluid:  1 mL/kcal or per MD  Anna Grimmer, MS, RDN, LDN Pager: [phone] Available Mondays and Fridays, 9am-2pm

## 2018-08-31 ENCOUNTER — Inpatient Hospital Stay (HOSPITAL_COMMUNITY): Payer: BLUE CROSS/BLUE SHIELD

## 2018-08-31 DIAGNOSIS — E875 Hyperkalemia: Secondary | ICD-10-CM

## 2018-08-31 DIAGNOSIS — N179 Acute kidney failure, unspecified: Secondary | ICD-10-CM

## 2018-08-31 LAB — POCT I-STAT 7, (LYTES, BLD GAS, ICA,H+H)
Acid-base deficit: 12 mmol/L — ABNORMAL HIGH (ref 0.0–2.0)
Bicarbonate: 14.3 mmol/L — ABNORMAL LOW (ref 20.0–28.0)
Bicarbonate: 24.7 mmol/L (ref 20.0–28.0)
Calcium, Ion: 1.06 mmol/L — ABNORMAL LOW (ref 1.15–1.40)
Calcium, Ion: 0.98 mmol/L — ABNORMAL LOW (ref 1.15–1.40)
HCT: 30 % — ABNORMAL LOW (ref 36.0–46.0)
HCT: 33 % — ABNORMAL LOW (ref 36.0–46.0)
Hemoglobin: 10.2 g/dL — ABNORMAL LOW (ref 12.0–15.0)
Hemoglobin: 11.2 g/dL — ABNORMAL LOW (ref 12.0–15.0)
O2 Saturation: 96 %
O2 Saturation: 94 %
Patient temperature: 98.6
Patient temperature: 96.6
Potassium: 4.8 mmol/L (ref 3.5–5.1)
Potassium: 3.4 mmol/L — ABNORMAL LOW (ref 3.5–5.1)
Sodium: 140 mmol/L (ref 135–145)
Sodium: 139 mmol/L (ref 135–145)
TCO2: 15 mmol/L — ABNORMAL LOW (ref 22–32)
TCO2: 26 mmol/L (ref 22–32)
pCO2 arterial: 34.6 mmHg (ref 32.0–48.0)
pCO2 arterial: 39.6 mmHg (ref 32.0–48.0)
pH, Arterial: 7.225 — ABNORMAL LOW (ref 7.350–7.450)
pH, Arterial: 7.398 (ref 7.350–7.450)
pO2, Arterial: 97 mmHg (ref 83.0–108.0)
pO2, Arterial: 69 mmHg — ABNORMAL LOW (ref 83.0–108.0)

## 2018-08-31 LAB — C4 COMPLEMENT: Complement C4, Body Fluid: 22 mg/dL (ref 14–44)

## 2018-08-31 LAB — RENAL FUNCTION PANEL
ALBUMIN: 1.8 g/dL — AB (ref 3.5–5.0)
ANION GAP: 13 (ref 5–15)
ANION GAP: 9 (ref 5–15)
Albumin: 1.9 g/dL — ABNORMAL LOW (ref 3.5–5.0)
BUN: 87 mg/dL — ABNORMAL HIGH (ref 6–20)
BUN: 75 mg/dL — ABNORMAL HIGH (ref 6–20)
CO2: 13 mmol/L — ABNORMAL LOW (ref 22–32)
CO2: 22 mmol/L (ref 22–32)
Calcium: 7.3 mg/dL — ABNORMAL LOW (ref 8.9–10.3)
Calcium: 7.3 mg/dL — ABNORMAL LOW (ref 8.9–10.3)
Chloride: 113 mmol/L — ABNORMAL HIGH (ref 98–111)
Chloride: 109 mmol/L (ref 98–111)
Creatinine, Ser: 3.42 mg/dL — ABNORMAL HIGH (ref 0.44–1.00)
Creatinine, Ser: 2.3 mg/dL — ABNORMAL HIGH (ref 0.44–1.00)
GFR calc Af Amer: 16 mL/min — ABNORMAL LOW (ref >60)
GFR calc Af Amer: 26 mL/min — ABNORMAL LOW (ref >60)
GFR calc non Af Amer: 14 mL/min — ABNORMAL LOW (ref >60)
GFR calc non Af Amer: 23 mL/min — ABNORMAL LOW (ref >60)
GLUCOSE: 139 mg/dL — AB (ref 70–99)
Glucose, Bld: 138 mg/dL — ABNORMAL HIGH (ref 70–99)
Phosphorus: 8.3 mg/dL — ABNORMAL HIGH (ref 2.5–4.6)
Phosphorus: 5.8 mg/dL — ABNORMAL HIGH (ref 2.5–4.6)
Potassium: 5.6 mmol/L — ABNORMAL HIGH (ref 3.5–5.1)
Potassium: 3.8 mmol/L (ref 3.5–5.1)
Sodium: 139 mmol/L (ref 135–145)
Sodium: 140 mmol/L (ref 135–145)

## 2018-08-31 LAB — CBC
HCT: 32 % — ABNORMAL LOW (ref 36.0–46.0)
Hemoglobin: 10.8 g/dL — ABNORMAL LOW (ref 12.0–15.0)
MCH: 33.5 pg (ref 26.0–34.0)
MCHC: 33.8 g/dL (ref 30.0–36.0)
MCV: 99.4 fL (ref 80.0–100.0)
NRBC: 0.3 % — AB (ref 0.0–0.2)
Platelets: 179 10*3/uL (ref 150–400)
RBC: 3.22 MIL/uL — ABNORMAL LOW (ref 3.87–5.11)
RDW: 22 % — ABNORMAL HIGH (ref 11.5–15.5)
WBC: 7.7 10*3/uL (ref 4.0–10.5)

## 2018-08-31 LAB — FANA STAINING PATTERNS: Homogeneous Pattern: 1:160 {titer} — ABNORMAL HIGH

## 2018-08-31 LAB — MAGNESIUM: Magnesium: 2.1 mg/dL (ref 1.7–2.4)

## 2018-08-31 LAB — ANTINUCLEAR ANTIBODIES, IFA: ANA Ab, IFA: POSITIVE — AB

## 2018-08-31 LAB — POCT ACTIVATED CLOTTING TIME
ACTIVATED CLOTTING TIME: 191 s
Activated Clotting Time: 147 seconds
Activated Clotting Time: 169 seconds
Activated Clotting Time: 186 seconds
Activated Clotting Time: 197 seconds
Activated Clotting Time: 197 seconds
Activated Clotting Time: 186 seconds

## 2018-08-31 LAB — GLUCOSE, CAPILLARY
Glucose-Capillary: 116 mg/dL — ABNORMAL HIGH (ref 70–99)
Glucose-Capillary: 125 mg/dL — ABNORMAL HIGH (ref 70–99)
Glucose-Capillary: 129 mg/dL — ABNORMAL HIGH (ref 70–99)
Glucose-Capillary: 135 mg/dL — ABNORMAL HIGH (ref 70–99)
Glucose-Capillary: 155 mg/dL — ABNORMAL HIGH (ref 70–99)

## 2018-08-31 LAB — BASIC METABOLIC PANEL
Anion gap: 10 (ref 5–15)
BUN: 94 mg/dL — ABNORMAL HIGH (ref 6–20)
CHLORIDE: 114 mmol/L — AB (ref 98–111)
CO2: 15 mmol/L — ABNORMAL LOW (ref 22–32)
CREATININE: 3.19 mg/dL — AB (ref 0.44–1.00)
Calcium: 7.4 mg/dL — ABNORMAL LOW (ref 8.9–10.3)
GFR calc non Af Amer: 15 mL/min — ABNORMAL LOW (ref >60)
GFR, EST AFRICAN AMERICAN: 18 mL/min — AB (ref >60)
Glucose, Bld: 163 mg/dL — ABNORMAL HIGH (ref 70–99)
Potassium: 4.5 mmol/L (ref 3.5–5.1)
Sodium: 139 mmol/L (ref 135–145)

## 2018-08-31 LAB — ANTI-DNA ANTIBODY, DOUBLE-STRANDED: ds DNA Ab: 39 IU/mL — ABNORMAL HIGH (ref 0–9)

## 2018-08-31 LAB — COMPLEMENT, TOTAL

## 2018-08-31 LAB — C3 COMPLEMENT: C3 Complement: 130 mg/dL (ref 82–167)

## 2018-08-31 LAB — PROCALCITONIN: PROCALCITONIN: 12.98 ng/mL

## 2018-08-31 MED ORDER — MIDAZOLAM HCL 2 MG/2ML IJ SOLN
1.0000 mg | INTRAMUSCULAR | Status: DC | PRN
  Administered 2018-08-31 – 2018-09-05 (×5): 2 mg via INTRAVENOUS
  Filled 2018-08-31 (×4): qty 2

## 2018-08-31 MED ORDER — CHLORHEXIDINE GLUCONATE CLOTH 2 % EX PADS
6.0000 | MEDICATED_PAD | Freq: Every day | CUTANEOUS | Status: DC
  Administered 2018-08-31 – 2018-09-12 (×9): 6 via TOPICAL

## 2018-08-31 MED ORDER — LEVOFLOXACIN IN D5W 500 MG/100ML IV SOLN
500.0000 mg | INTRAVENOUS | Status: DC
  Administered 2018-09-01 – 2018-09-05 (×5): 500 mg via INTRAVENOUS
  Filled 2018-08-31 (×5): qty 100

## 2018-08-31 MED ORDER — PRISMASOL BGK 4/2.5 32-4-2.5 MEQ/L REPLACEMENT SOLN
Status: DC
  Administered 2018-08-31 – 2018-09-04 (×15): via INTRAVENOUS_CENTRAL
  Filled 2018-08-31 (×30): qty 5000

## 2018-08-31 MED ORDER — SODIUM CHLORIDE 0.9% FLUSH
10.0000 mL | Freq: Two times a day (BID) | INTRAVENOUS | Status: DC
  Administered 2018-09-01 – 2018-09-03 (×4): 10 mL
  Administered 2018-09-03: 30 mL
  Administered 2018-09-04 (×2): 10 mL
  Administered 2018-09-05: 20 mL
  Administered 2018-09-06 – 2018-09-12 (×8): 10 mL

## 2018-08-31 MED ORDER — HEPARIN (PORCINE) 2000 UNITS/L FOR CRRT
INTRAVENOUS_CENTRAL | Status: DC | PRN
  Administered 2018-08-31: 14:00:00 via INTRAVENOUS_CENTRAL
  Filled 2018-08-31 (×2): qty 1000

## 2018-08-31 MED ORDER — FENTANYL BOLUS VIA INFUSION
50.0000 ug | INTRAVENOUS | Status: DC | PRN
  Administered 2018-08-31 – 2018-09-05 (×18): 50 ug via INTRAVENOUS
  Filled 2018-08-31: qty 50

## 2018-08-31 MED ORDER — STERILE WATER FOR INJECTION IV SOLN
INTRAVENOUS | Status: DC
  Administered 2018-08-31 – 2018-09-01 (×7): via INTRAVENOUS_CENTRAL
  Filled 2018-08-31 (×9): qty 150
  Filled 2018-08-31: qty 100
  Filled 2018-08-31: qty 150

## 2018-08-31 MED ORDER — SODIUM CHLORIDE 0.9 % IV SOLN
250.0000 [IU]/h | INTRAVENOUS | Status: DC
  Administered 2018-08-31: 250 [IU]/h via INTRAVENOUS_CENTRAL
  Administered 2018-09-01: 650 [IU]/h via INTRAVENOUS_CENTRAL
  Administered 2018-09-01: 750 [IU]/h via INTRAVENOUS_CENTRAL
  Administered 2018-09-02 – 2018-09-03 (×2): 600 [IU]/h via INTRAVENOUS_CENTRAL
  Administered 2018-09-03: 500 [IU]/h via INTRAVENOUS_CENTRAL
  Administered 2018-09-04: 800 [IU]/h via INTRAVENOUS_CENTRAL
  Filled 2018-08-31 (×7): qty 2

## 2018-08-31 MED ORDER — QUETIAPINE FUMARATE 100 MG PO TABS
400.0000 mg | ORAL_TABLET | Freq: Every day | ORAL | Status: DC
  Administered 2018-08-31 – 2018-09-11 (×12): 400 mg via ORAL
  Filled 2018-08-31 (×12): qty 4

## 2018-08-31 MED ORDER — HEPARIN BOLUS VIA INFUSION (CRRT)
1000.0000 [IU] | INTRAVENOUS | Status: DC | PRN
  Administered 2018-08-31: 1000 [IU] via INTRAVENOUS_CENTRAL
  Filled 2018-08-31: qty 1000

## 2018-08-31 MED ORDER — LEVOFLOXACIN IN D5W 500 MG/100ML IV SOLN
500.0000 mg | INTRAVENOUS | Status: DC
  Administered 2018-08-31: 500 mg via INTRAVENOUS
  Filled 2018-08-31: qty 100

## 2018-08-31 MED ORDER — SODIUM POLYSTYRENE SULFONATE 15 GM/60ML PO SUSP
30.0000 g | Freq: Once | ORAL | Status: AC
  Administered 2018-08-31: 30 g
  Filled 2018-08-31: qty 120

## 2018-08-31 MED ORDER — HEPARIN SODIUM (PORCINE) 1000 UNIT/ML DIALYSIS
1000.0000 [IU] | INTRAMUSCULAR | Status: DC | PRN
  Administered 2018-09-04: 3000 [IU] via INTRAVENOUS_CENTRAL
  Filled 2018-08-31 (×2): qty 6
  Filled 2018-08-31: qty 3

## 2018-08-31 MED ORDER — SODIUM CHLORIDE 0.9% FLUSH: 10.0000 mL | INTRAVENOUS | Status: DC | PRN

## 2018-08-31 MED ORDER — PRISMASOL BGK 4/2.5 32-4-2.5 MEQ/L IV SOLN
INTRAVENOUS | Status: DC
  Administered 2018-08-31 – 2018-09-04 (×16): via INTRAVENOUS_CENTRAL
  Filled 2018-08-31 (×30): qty 5000

## 2018-08-31 NOTE — Progress Notes (Addendum)
Pt continued to be asynchronous with ventilator. Double stacking and not letting ventilator give pt breaths. RR on vent set to 24, however pt only receiving 8-10 breaths per minute. Pt blocking ventilator breaths despite being on adequate sedation. Dr. Elsworth Soho video in room and made ventilator changes with RT. See new orders.

## 2018-08-31 NOTE — Progress Notes (Signed)
Patient with frequent episodes of bradycardia in the mid 40's. Only lasts briefly before resuming a rate of mid 50's. BP remains stable. CCM NP notified who stated to wean from precedex and use fentanyl for sedation. Precedex now off. Will monitor.  Joellen Jersey, RN

## 2018-08-31 NOTE — Progress Notes (Signed)
[facility] Progress Note Patient Name: Anna [name] DOB: 12-14-1960 MRN: [id number]   Date of Service  08/31/2018  HPI/Events of Note  Asked to check for vent asynchrony On PC/PEEP 5/80% RR 24 but very asynchronous inspite of deep sedation & only getting RR 9-10 on monitor because other breaths very small & not being picked up  eICU Interventions  Change to PRVC / 8cc = 490 cc  RR 20 Add PEEP 8 & titrate up if unable to decrease FIO2 Check ABG in 1h     Intervention Category Major Interventions: Respiratory failure - evaluation and management  Rakesh V. Alva 08/31/2018, 9:53 PM

## 2018-08-31 NOTE — Progress Notes (Signed)
Changes made Per Dr Elsworth Soho via Warren Lacy

## 2018-08-31 NOTE — Progress Notes (Addendum)
Called [facility] regarding pt sedation issues. Pt easily agitated with any stimulation, trying to get out of bed. Safety mitts on. Pt double stacking on the ventilator, on 250 fentanyl gtt, and 0.4 precedex gtt (low limit). Pt HR ranges from 48-90.   Called for guidance on sedation d/t pt HR and asynchrony with ventilator. MD placed new orders.

## 2018-08-31 NOTE — Plan of Care (Signed)
  Problem: Clinical Measurements: Goal: Ability to maintain clinical measurements within normal limits will improve Outcome: Progressing   Problem: Nutrition: Goal: Adequate nutrition will be maintained Outcome: Progressing   Problem: Pain Managment: Goal: General experience of comfort will improve Outcome: Progressing   Problem: Clinical Measurements: Goal: Diagnostic test results will improve Outcome: Not Progressing Goal: Respiratory complications will improve Outcome: Not Progressing   Problem: Elimination: Goal: Will not experience complications related to urinary retention Outcome: Not Progressing

## 2018-08-31 NOTE — Procedures (Signed)
HD Trialysis Catheter Insertion  Consent from husband.  R IJ cleaned and draped, done under maximal sterile condition, lidocaine injected, vein found by U/S, wire placed, skin cut and dilated serially, HC catheter placed, flushed and sutured.  CXR ordered and pending.  Rush Farmer, M.D. [facility] Pulmonary/Critical Care Medicine. Pager: [phone]. After hours pager: [phone].

## 2018-08-31 NOTE — Progress Notes (Signed)
NAME:  Anna [name], MRN:  [id number], DOB:  [birth year], LOS: 2 ADMISSION DATE:  08/29/2018, CONSULTATION DATE:  2/23 REFERRING MD:  Venora Maples, CHIEF COMPLAINT:  Acute hypoxic respiratory failure and Pneumonia   Brief History   58 year old female patient on Imuran, Plaquenil, and prednisone for history of lupus.  Admitted 2/23 with pneumonia and respiratory failure requiring noninvasive positive pressure ventilation.  Admitting to intensive care given high risk for clinical decompensation  History of present illness   58 year old female with history of lupus, on Imuran, Plaquenil, and prednisone at baseline, seen in our office 2/18 for routine pulmonary follow-up, no acute pulmonary symptoms at that point.  Presented to the emergency room on 2/23 with 24-hour history of cough, myalgias, worsening shortness of breath, and altered sensorium.  EMS called, temperature 101.  On arrival found to be tachypneic, hypoxic With room air saturations 85%, and also new marked right-sided infiltrate.  Was placed on noninvasive positive pressure ventilation with improvement in work of breathing however did remain tachypneic and tachycardic.  Pulmonary admitting to intensive care given high risk of clinical decompensation  Past Medical History  Lupus on Imuran and Plaquenil as well as prednisone 5 mg daily.  Lupus primarily affecting joints, no evidence of pulmonary involvement as of yet.  Pulmonary hypertension, tobacco abuse, hypertension, hyperlipidemia, depression, prior CVA, admitted in February 2018 for pneumonia with parapneumonic effusion, also just discharged from hospitalization in January 2024 benzodiazepine overdose  Significant Hospital Events   2/23: Presented the emergency room With 24-hour history of cough, myalgias, fever as high as 101 and altered sensorium chest x-ray demonstrating pulmonary infiltrate on the right, hypoxic and tachypneic requiring BiPAP support critical care admitting  Consults:     Procedures:    Significant Diagnostic Tests:    Micro Data:  Respiratory viral panel 2/23>>>Pending Flu A and B 2/23>>>Negative Urine strep antigen 2/23>>>Pending Urine Legionella antigen 2/23>>>Pending Sputum culture 2/23>>> Blood culture 2/23>>  Antimicrobials:  Vancomycin 2/23>>> Zosyn 2/23>>> Levaquin 2/23>>>  Interim history/subjective:  No events overnight, O2 and PEEP demand are improving  Objective   Blood pressure 126/89, pulse 61, temperature 97.7 F (36.5 C), temperature source Axillary, resp. rate (!) 21, height 5\' 6"  (1.676 m), weight 87.2 kg, last menstrual period 07/07/2002, SpO2 99 %.    Vent Mode: PCV FiO2 (%):  [40 %-60 %] 40 % Set Rate:  [24 bmp] 24 bmp Vt Set:  [470 mL] 470 mL PEEP:  [5 cmH20-16 cmH20] 5 cmH20 Plateau Pressure:  [24 cmH20-28 cmH20] 27 cmH20   Intake/Output Summary (Last 24 hours) at 08/31/2018 4818 Last data filed at 08/31/2018 0800 Gross per 24 hour  Intake 2304.79 ml  Output 1000 ml  Net 1304.79 ml   Filed Weights   08/29/18 0740 08/30/18 0355  Weight: 78.4 kg 87.2 kg   Examination: General: Acutely ill appearing female, NAD HENT: [place]/AT, PERRL, EOM-I and MMM Lungs: Coarse BS diffusely Cardiovascular: RRR, Nl S1/S2 and -M/R/G Abdomen: Soft, NT, ND and +BS Extremities: Trace lower extremity edema brisk cap refill strong pulses Neuro: Arousable and following commands Skin: Intact  I reviewed CXR myself, ETT is in a good position and infiltrate noted  Resolved Hospital Problem list     Assessment & Plan:  Acute hypoxic respiratory failure in the setting of pneumonia.  She is immunocompromised on Imuran and prednisone Portable chest x-ray personally reviewed this demonstrates severe right sided infiltrate Plan Continue full vent support Decrease PEEP to 5 cmH2O Maintain on PCV for comfort  at this point Panculture, send urinary antigens, and respiratory viral panel Hold Imuran KVO IVF Hold diurese Zithromax,  cefepime, vanc as ordered until cultures result  Severe sepsis secondary to pneumonia -Was volume responsive in the emergency room however did present with systolic pressure in the 50P.  Lactic acid was normal Plan KVO IVF Stress dose steroids given chronic prednisone Antibiotics as mentioned above  Pulmonary hypertension Plan Telemetry monitoring Avoiding hypoxia  Acute metabolic encephalopathy in the setting of sepsis and hypoxia Plan Holding sedating medications Supportive care She is chronically on Seroquel, as well as Lexapro and as needed Valium.  We will need to reevaluate this daily, but holding for now given sedation for vent support  Acute on chronic renal failure stage III; appears as though baseline serum creatinine around 1.9 Plan KVO IVF Place HD catheter today for dialysis as K is rising and UOP remains low Renal dose medications Strict intake output Follow-up blood chemistry  Fluid electrolyte balance/acid-base imbalance: Mild hyperchloremia, non-anion gap metabolic acidosis Plan KVO IVF Serial chemistries  Macrocytic anemia anemia, 1 month ago hemoglobin 7.9, currently 7.1, she is followed by hematology at [facility], felt secondary to her lupus Plan Trending CBC Transfuse for hemoglobin less than 7  Hilar lung mass Plan Follow-up outpatient PET scan  Hypothyroidism Plan Continue supplementation  Nicotine dependence Plan Smoking cessation  History of lupus Plan Holding Imuran Follow-up rheumatology  Husband updated over the phone  Best practice:  Diet: N.p.o. Pain/Anxiety/Delirium protocol (if indicated): Not applicable VAP protocol (if indicated): Not applicable DVT prophylaxis: Subcutaneous heparin GI prophylaxis: PPI Glucose control: Sliding scale insulin Mobility: Bedrest Code Status: Full code Family Communication: Pending Disposition: Critically ill due to high risk of progressive respiratory failure admitting to intensive care for  close observation, and high concern about need for intubation  Labs   CBC: Recent Labs  Lab 08/29/18 0747  08/30/18 0458 08/30/18 0609 08/30/18 1033 08/31/18 0350 08/31/18 0745  WBC 12.1*  --  9.9  --   --   --  7.7  NEUTROABS 11.1*  --   --   --   --   --   --   HGB 7.1*   < > 10.0* 9.5* 9.5* 10.2* 10.8*  HCT 21.2*   < > 29.1* 28.0* 28.0* 30.0* 32.0*  MCV 112.2*  --  100.7*  --   --   --  99.4  PLT 213  --  178  --   --   --  179   < > = values in this interval not displayed.    Basic Metabolic Panel: Recent Labs  Lab 08/29/18 0747  08/30/18 0458 08/30/18 0609 08/30/18 1033 08/31/18 0243 08/31/18 0350  NA 139   < > 140 140 140 139 140  K 4.0   < > 5.0 5.0 4.9 5.6* 4.8  CL 112*  --  112*  --   --  113*  --   CO2 15*  --  14*  --   --  13*  --   GLUCOSE 120*  --  122*  --   --  139*  --   BUN 47*  --  59*  --   --  87*  --   CREATININE 3.13*  --  3.28*  --   --  3.42*  --   CALCIUM 8.2*  --  7.8*  --   --  7.3*  --   MG  --   --  1.8  --   --  2.1  --   PHOS  --   --  8.0*  --   --  8.3*  --    < > = values in this interval not displayed.   GFR: Estimated Creatinine Clearance: 19.7 mL/min (A) (by C-G formula based on SCr of 3.42 mg/dL (H)). Recent Labs  Lab 08/29/18 0747 08/29/18 0814 08/29/18 0941 08/29/18 0943 08/30/18 0458 08/31/18 0243 08/31/18 0745  PROCALCITON  --   --  5.92  --  15.47 12.98  --   WBC 12.1*  --   --   --  9.9  --  7.7  LATICACIDVEN  --  1.4  --  1.3  --   --   --     Liver Function Tests: Recent Labs  Lab 08/29/18 0747 08/31/18 0243  AST 15  --   ALT 11  --   ALKPHOS 69  --   BILITOT 0.5  --   PROT 6.0*  --   ALBUMIN 2.2* 1.9*   No results for input(s): LIPASE, AMYLASE in the last 168 hours. No results for input(s): AMMONIA in the last 168 hours.  ABG    Component Value Date/Time   PHART 7.225 (L) 08/31/2018 0350   PCO2ART 34.6 08/31/2018 0350   PO2ART 97.0 08/31/2018 0350   HCO3 14.3 (L) 08/31/2018 0350   TCO2 15  (L) 08/31/2018 0350   ACIDBASEDEF 12.0 (H) 08/31/2018 0350   O2SAT 96.0 08/31/2018 0350     Coagulation Profile: Recent Labs  Lab 08/29/18 0747  INR 1.46    Cardiac Enzymes: Recent Labs  Lab 08/29/18 0941  TROPONINI 0.07*    HbA1C: No results found for: HGBA1C  CBG: Recent Labs  Lab 08/30/18 1513 08/30/18 1943 08/31/18 0002 08/31/18 0350 08/31/18 0725  GLUCAP 123* 149* 129* 135* 125*   The patient is critically ill with multiple organ systems failure and requires high complexity decision making for assessment and support, frequent evaluation and titration of therapies, application of advanced monitoring technologies and extensive interpretation of multiple databases.   Critical Care Time devoted to patient care services described in this note is  31  Minutes. This time reflects time of care of this signee Dr Jennet Maduro. This critical care time does not reflect procedure time, or teaching time or supervisory time of PA/NP/Med student/Med Resident etc but could involve care discussion time.  Rush Farmer, M.D. [facility] Pulmonary/Critical Care Medicine. Pager: [phone]. After hours pager: [phone].

## 2018-08-31 NOTE — Progress Notes (Signed)
Patient ID: Anna [name], female   DOB: May 11, 1961, 58 y.o.   MRN: [id number] S: No significant improvement of renal function with worsening acidosis and hyperkalemia. O:BP 121/85   Pulse 60   Temp 97.7 F (36.5 C) (Axillary)   Resp (!) 24   Ht '5\' 6"'$  (1.676 m)   Wt 87.2 kg   LMP 07/07/2002 (Exact Date)   SpO2 93%   BMI 31.03 kg/m   Intake/Output Summary (Last 24 hours) at 08/31/2018 0957 Last data filed at 08/31/2018 0900 Gross per 24 hour  Intake 2483.54 ml  Output 1000 ml  Net 1483.54 ml   Intake/Output: I/O last 3 completed shifts: In: 4108.5 [I.V.:2419.7; Blood:766.7; NG/GT:422; IV Piggyback:500.1] Out: 1000 [Urine:1000]  Intake/Output this shift:  Total I/O In: 818.4 [I.V.:80.4; NG/GT:638; IV Piggyback:100] Out: -  Weight change:  Gen: intubated and unresponsive CVS: no rub Resp: scattered rhonchi Abd:+BS, soft, distended, NT Ext: 1+ ankle edema  Recent Labs  Lab 08/29/18 0747 08/29/18 0802 08/29/18 1604 08/30/18 0458 08/30/18 0609 08/30/18 1033 08/31/18 0243 08/31/18 0350  NA 139 140 141 140 140 140 139 140  K 4.0 4.0 4.6 5.0 5.0 4.9 5.6* 4.8  CL 112*  --   --  112*  --   --  113*  --   CO2 15*  --   --  14*  --   --  13*  --   GLUCOSE 120*  --   --  122*  --   --  139*  --   BUN 47*  --   --  59*  --   --  87*  --   CREATININE 3.13*  --   --  3.28*  --   --  3.42*  --   ALBUMIN 2.2*  --   --   --   --   --  1.9*  --   CALCIUM 8.2*  --   --  7.8*  --   --  7.3*  --   PHOS  --   --   --  8.0*  --   --  8.3*  --   AST 15  --   --   --   --   --   --   --   ALT 11  --   --   --   --   --   --   --    Liver Function Tests: Recent Labs  Lab 08/29/18 0747 08/31/18 0243  AST 15  --   ALT 11  --   ALKPHOS 69  --   BILITOT 0.5  --   PROT 6.0*  --   ALBUMIN 2.2* 1.9*   No results for input(s): LIPASE, AMYLASE in the last 168 hours. No results for input(s): AMMONIA in the last 168 hours. CBC: Recent Labs  Lab 08/29/18 0747  08/30/18 0458   08/30/18 1033 08/31/18 0350 08/31/18 0745  WBC 12.1*  --  9.9  --   --   --  7.7  NEUTROABS 11.1*  --   --   --   --   --   --   HGB 7.1*   < > 10.0*   < > 9.5* 10.2* 10.8*  HCT 21.2*   < > 29.1*   < > 28.0* 30.0* 32.0*  MCV 112.2*  --  100.7*  --   --   --  99.4  PLT 213  --  178  --   --   --  179   < > = values in this interval not displayed.   Cardiac Enzymes: Recent Labs  Lab 08/29/18 0941  TROPONINI 0.07*   CBG: Recent Labs  Lab 08/30/18 1513 08/30/18 1943 08/31/18 0002 08/31/18 0350 08/31/18 0725  GLUCAP 123* 149* 129* 135* 125*    Iron Studies:  Recent Labs    08/29/18 0941  IRON 9*  TIBC 126*  FERRITIN 331*   Studies/Results: US Renal  Result Date: 08/30/2018 CLINICAL DATA:  Acute renal failure.  Hypertension, kidney stones. EXAM: RENAL / URINARY TRACT ULTRASOUND COMPLETE COMPARISON:  None. FINDINGS: Right Kidney: Renal measurements: 12.3 x 5.8 x 5 cm = volume: 186 mL. Renal cortex is diffusely echogenic. Small cysts within the upper pole, largest measuring 1.6 cm. No suspicious mass or hydronephrosis. Left Kidney: Renal measurements: 10.4 x 5.4 x 5 cm = volume: 148 mL. Renal cortex is diffusely echogenic. Mildly complicated cyst within the lower pole measures 1.8 cm. No suspicious mass or hydronephrosis. Bladder: Appears normal for degree of bladder distention. Ascites noted within the abdomen and pelvis. IMPRESSION: 1. Both kidneys are diffusely echogenic indicating medical renal disease. No suspicious mass or hydronephrosis bilaterally. 2. Ascites. Electronically Signed   By: Franki Cabot M.D.   On: 08/30/2018 20:03   Dg Chest Port 1 View  Result Date: 08/31/2018 CLINICAL DATA:  ETT present,,resp failure EXAM: PORTABLE CHEST 1 VIEW COMPARISON:  Chest x-rays dated 08/30/2018 and 08/29/2018 FINDINGS: Endotracheal tube is adequately positioned with tip just below the level of the clavicles, approximately 5 cm above the carina. Additional coiled catheter overlying  the endotracheal tube, of uncertain etiology. Enteric tube passes below the diaphragm. Heart size and mediastinal contours appear stable. Persistent RIGHT perihilar opacity, not significantly changed in the interval, superimposed on a background of emphysematous change. No pleural effusion or pneumothorax seen. IMPRESSION: 1. Endotracheal tube is adequately positioned with tip just below the level of the clavicles, approximately 5 cm above the carina. 2. Additional coiled catheter overlying the endotracheal tube, of uncertain etiology. Recommend clinical correlation. 3. Persistent RIGHT perihilar opacity, not significantly changed in the interval, suspected pneumonia superimposed on a background of emphysematous change. Recent chest CT of 07/26/2018 suggested the possibility of RIGHT perihilar mass with postobstructive infiltrate. Electronically Signed   By: Franki Cabot M.D.   On: 08/31/2018 09:07   Dg Chest Port 1 View  Result Date: 08/30/2018 CLINICAL DATA:  Intubated, pneumonia EXAM: PORTABLE CHEST 1 VIEW COMPARISON:  Chest radiograph from one day prior. FINDINGS: Endotracheal tube tip is 3.7 cm above the carina. Enteric tube enters stomach with the tip not seen on this image. Stable cardiomediastinal silhouette with top-normal heart size. No pneumothorax. Probable small left pleural effusion, stable. No significant right pleural effusion. Stable extensive patchy consolidation in the right perihilar and bilateral retrocardiac lungs. IMPRESSION: 1. Well-positioned support structures. 2. Stable small left pleural effusion. 3. Stable extensive patchy right perihilar and bilateral retrocardiac consolidation compatible with a combination of multilobar pneumonia and atelectasis. Electronically Signed   By: Ilona Sorrel M.D.   On: 08/30/2018 09:09   Dg Chest Portable 1 View  Result Date: 08/29/2018 CLINICAL DATA:  Status post repositioning of endotracheal tube. EXAM: PORTABLE CHEST 1 VIEW COMPARISON:   Single-view of the chest approximately 6 minutes prior to this exam. FINDINGS: Endotracheal tube has been pulled back with its tip now in good position 2.5 cm above the carina. No other change. IMPRESSION: ET tube now in good position.  No other change. Electronically  Signed   By: Inge Rise M.D.   On: 08/29/2018 13:37   Dg Chest Portable 1 View  Result Date: 08/29/2018 CLINICAL DATA:  Status post endotracheal tube placement. EXAM: PORTABLE CHEST 1 VIEW COMPARISON:  Single-view of the chest earlier today. FINDINGS: New NG tube is in place with its tip in the stomach. Endotracheal tube tip is approximately 0.9 cm above the carina. Extensive airspace disease in the right chest is unchanged. Cardiomegaly noted. IMPRESSION: NG tube tip is 0.9 cm above the carina. The tube should be withdrawn 1-2 cm. NG tube in good position. No change in airspace disease on the right. Electronically Signed   By: Inge Rise M.D.   On: 08/29/2018 13:36   Dg Abd Portable 1v  Result Date: 08/31/2018 CLINICAL DATA:  Orogastric tube placement. EXAM: PORTABLE ABDOMEN - 1 VIEW COMPARISON:  None. FINDINGS: Enteric tube is well positioned within the stomach with tip directed towards the stomach pylorus/duodenal bulb. Moderate amount of bowel gas and stool within the visualized portion of the colon. No evidence of free intraperitoneal air. IMPRESSION: Enteric tube well positioned within the stomach with tip directed towards the stomach pylorus/duodenal bulb. Electronically Signed   By: Franki Cabot M.D.   On: 08/31/2018 09:08   Dg Abd Portable 1 View  Result Date: 08/29/2018 CLINICAL DATA:  OG tube placement. EXAM: PORTABLE ABDOMEN - 1 VIEW COMPARISON:  None. FINDINGS: 1252 hours. Diffuse gaseous bowel distention noted. NG tube tip overlies the proximal stomach. Telemetry leads evident. IMPRESSION: NG tube tip overlies the proximal stomach with diffuse gaseous bowel distention. Electronically Signed   By: Misty Stanley  M.D.   On: 08/29/2018 13:36   . chlorhexidine gluconate (MEDLINE KIT)  15 mL Mouth Rinse BID  . folic acid  1 mg Oral Daily  . heparin  5,000 Units Subcutaneous Q8H  . hydrocortisone sod succinate (SOLU-CORTEF) inj  50 mg Intravenous Q6H  . hydroxychloroquine  200 mg Oral Daily  . insulin aspart  0-15 Units Subcutaneous Q4H  . levothyroxine  50 mcg Oral Q0600  . mouth rinse  15 mL Mouth Rinse 10 times per day  . midazolam  2 mg Intravenous Once  . pantoprazole (PROTONIX) IV  40 mg Intravenous QHS  . vitamin B-12  1,000 mcg Oral Daily    BMET    Component Value Date/Time   NA 140 08/31/2018 0350   K 4.8 08/31/2018 0350   CL 113 (H) 08/31/2018 0243   CO2 13 (L) 08/31/2018 0243   GLUCOSE 139 (H) 08/31/2018 0243   BUN 87 (H) 08/31/2018 0243   CREATININE 3.42 (H) 08/31/2018 0243   CREATININE 1.17 (H) 01/10/2013 1234   CALCIUM 7.3 (L) 08/31/2018 0243   GFRNONAA 14 (L) 08/31/2018 0243   GFRAA 16 (L) 08/31/2018 0243   CBC    Component Value Date/Time   WBC 7.7 08/31/2018 0745   RBC 3.22 (L) 08/31/2018 0745   HGB 10.8 (L) 08/31/2018 0745   HCT 32.0 (L) 08/31/2018 0745   PLT 179 08/31/2018 0745   MCV 99.4 08/31/2018 0745   MCH 33.5 08/31/2018 0745   MCHC 33.8 08/31/2018 0745   RDW 22.0 (H) 08/31/2018 0745   LYMPHSABS 0.3 (L) 08/29/2018 0747   MONOABS 0.5 08/29/2018 0747   EOSABS 0.0 08/29/2018 0747   BASOSABS 0.0 08/29/2018 0747    Assessment/Plan: 1.  AKI/CKD stage 3 in setting of sepsis related to pneumonia.  She was hypotensive overnight and likely ischemic ATN.  She has received  5 liters of LR over the past 24 hours and has not had any significant UOP.   1. bladder scan with 562m so she does have some urinary retention. Cont with I&O cath for now but may need foley. 2. Renal UKoreaconsistent with chronic medical renal disease but no obstruction or masses. 3. Remains oliguric and worsening metabolic acidosis and hyperkalemia.  Will initiate CVVHD at this time and appreciate  PCCM assistance with HD cath placement. 4. will continue to follow closely. 5. UA with some blood and protein, no history of kidney involvement of lupus in the past.  complement levels are WNL so doubt lupus flare..  2. Sepsis due to pneumonia in immunocompromised patient.  Not yet on pressors 3. VDRF- per PCCM 4. Metabolic adisosis - due to #1.  Will start pt on CVVHD and follow as isotonic bicarb did not help.  Vent adjustments per PCCM 5. Pulmonary HTN- per PCCM 6. AMS- likely due to sepsis and hyoxia 7. Anemia- transfuse prn.  Felt to be related to SLE 8. 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 9. SLE- agree with stress dose steroids and holding imuran.   JDonetta Potts MD [employer]([phone]

## 2018-08-31 NOTE — Progress Notes (Signed)
[place] [place] Progress Note Patient Name: Anna [name] DOB: 01-13-1961 MRN: [id number]   Date of Service  08/31/2018  HPI/Events of Note  K+ = 5.6 and Creatinine = 3.42.   eICU Interventions  Will order: 1. Kayexalate 30 gm per tube now.  2. Repeat BMP at 12 noon.      Intervention Category Major Interventions: Electrolyte abnormality - evaluation and management  Azrael Huss [place] 08/31/2018, 5:20 AM

## 2018-08-31 NOTE — Progress Notes (Signed)
Patient became very agitated after being straight cath. Patient was continuously trying to pull of her mittens, sit up in bed, and get out of bed. Sedation was titrated up to achieve Rass goal. Will try to titrate sedation back down once Rass goal has been achieved.

## 2018-08-31 NOTE — Progress Notes (Signed)
eLink Physician-Brief Progress Note Patient Name: Anna [name] DOB: Oct 11, 1960 MRN: [id number]   Date of Service  08/31/2018  HPI/Events of Note  Agitated On fent @ 250 Precedex caused brady  eICU Interventions  Versed prn (on diazepam for anxiety) Also resume home dose seroquel     Intervention Category Major Interventions: Delirium, psychosis, severe agitation - evaluation and management  Neal Oshea V. [name] 08/31/2018, 8:19 PM

## 2018-08-31 NOTE — Progress Notes (Signed)
Patient OGT placement was checked by ausculation and measuring the length of the external tube prior to using it. Tube measured at 35 cm. Was unable to flush OGT. Pulled back on OG tube and the tube was found to be coiled in the back of the patient's mouth. Removed OGT and reinserted a new OG tube. Tube placement verified by auscultation. Will enter an order for confirmation of tube placement via xray before using.

## 2018-09-01 ENCOUNTER — Inpatient Hospital Stay (HOSPITAL_COMMUNITY): Payer: BLUE CROSS/BLUE SHIELD

## 2018-09-01 DIAGNOSIS — A419 Sepsis, unspecified organism: Principal | ICD-10-CM

## 2018-09-01 DIAGNOSIS — R6521 Severe sepsis with septic shock: Secondary | ICD-10-CM

## 2018-09-01 LAB — POCT I-STAT 7, (LYTES, BLD GAS, ICA,H+H)
ACID-BASE EXCESS: 8 mmol/L — AB (ref 0.0–2.0)
Acid-Base Excess: 6 mmol/L — ABNORMAL HIGH (ref 0.0–2.0)
BICARBONATE: 32 mmol/L — AB (ref 20.0–28.0)
Bicarbonate: 31 mmol/L — ABNORMAL HIGH (ref 20.0–28.0)
Calcium, Ion: 1.08 mmol/L — ABNORMAL LOW (ref 1.15–1.40)
Calcium, Ion: 0.99 mmol/L — ABNORMAL LOW (ref 1.15–1.40)
HCT: 31 % — ABNORMAL LOW (ref 36.0–46.0)
HEMATOCRIT: 36 % (ref 36.0–46.0)
Hemoglobin: 12.2 g/dL (ref 12.0–15.0)
Hemoglobin: 10.5 g/dL — ABNORMAL LOW (ref 12.0–15.0)
O2 Saturation: 91 %
O2 Saturation: 98 %
PCO2 ART: 43.9 mmHg (ref 32.0–48.0)
POTASSIUM: 3.7 mmol/L (ref 3.5–5.1)
Patient temperature: 98.6
Patient temperature: 97.7
Potassium: 3.3 mmol/L — ABNORMAL LOW (ref 3.5–5.1)
Sodium: 134 mmol/L — ABNORMAL LOW (ref 135–145)
Sodium: 138 mmol/L (ref 135–145)
TCO2: 33 mmol/L — ABNORMAL HIGH (ref 22–32)
TCO2: 32 mmol/L (ref 22–32)
pCO2 arterial: 42.7 mmHg (ref 32.0–48.0)
pH, Arterial: 7.483 — ABNORMAL HIGH (ref 7.350–7.450)
pH, Arterial: 7.456 — ABNORMAL HIGH (ref 7.350–7.450)
pO2, Arterial: 57 mmHg — ABNORMAL LOW (ref 83.0–108.0)
pO2, Arterial: 98 mmHg (ref 83.0–108.0)

## 2018-09-01 LAB — RENAL FUNCTION PANEL
ALBUMIN: 1.7 g/dL — AB (ref 3.5–5.0)
Albumin: 2.1 g/dL — ABNORMAL LOW (ref 3.5–5.0)
Anion gap: 12 (ref 5–15)
Anion gap: 11 (ref 5–15)
BUN: 58 mg/dL — ABNORMAL HIGH (ref 6–20)
BUN: 43 mg/dL — AB (ref 6–20)
CO2: 29 mmol/L (ref 22–32)
CO2: 29 mmol/L (ref 22–32)
Calcium: 6.9 mg/dL — ABNORMAL LOW (ref 8.9–10.3)
Calcium: 7.7 mg/dL — ABNORMAL LOW (ref 8.9–10.3)
Chloride: 98 mmol/L (ref 98–111)
Chloride: 98 mmol/L (ref 98–111)
Creatinine, Ser: 1.7 mg/dL — ABNORMAL HIGH (ref 0.44–1.00)
Creatinine, Ser: 1.4 mg/dL — ABNORMAL HIGH (ref 0.44–1.00)
GFR calc Af Amer: 38 mL/min — ABNORMAL LOW (ref >60)
GFR calc Af Amer: 48 mL/min — ABNORMAL LOW (ref >60)
GFR calc non Af Amer: 32 mL/min — ABNORMAL LOW (ref >60)
GFR, EST NON AFRICAN AMERICAN: 41 mL/min — AB (ref >60)
Glucose, Bld: 147 mg/dL — ABNORMAL HIGH (ref 70–99)
Glucose, Bld: 146 mg/dL — ABNORMAL HIGH (ref 70–99)
POTASSIUM: 3.5 mmol/L (ref 3.5–5.1)
Phosphorus: 3.4 mg/dL (ref 2.5–4.6)
Phosphorus: 2.6 mg/dL (ref 2.5–4.6)
Potassium: 3.7 mmol/L (ref 3.5–5.1)
Sodium: 139 mmol/L (ref 135–145)
Sodium: 138 mmol/L (ref 135–145)

## 2018-09-01 LAB — POCT ACTIVATED CLOTTING TIME
Activated Clotting Time: 191 seconds
Activated Clotting Time: 191 seconds
Activated Clotting Time: 197 seconds
Activated Clotting Time: 197 seconds
Activated Clotting Time: 197 seconds
Activated Clotting Time: 202 seconds
Activated Clotting Time: 191 seconds
Activated Clotting Time: 186 seconds
Activated Clotting Time: 186 seconds
Activated Clotting Time: 191 seconds
Activated Clotting Time: 197 seconds
Activated Clotting Time: 208 seconds
Activated Clotting Time: 208 seconds

## 2018-09-01 LAB — GLUCOSE, CAPILLARY
GLUCOSE-CAPILLARY: 146 mg/dL — AB (ref 70–99)
Glucose-Capillary: 137 mg/dL — ABNORMAL HIGH (ref 70–99)
Glucose-Capillary: 150 mg/dL — ABNORMAL HIGH (ref 70–99)
Glucose-Capillary: 121 mg/dL — ABNORMAL HIGH (ref 70–99)
Glucose-Capillary: 118 mg/dL — ABNORMAL HIGH (ref 70–99)
Glucose-Capillary: 129 mg/dL — ABNORMAL HIGH (ref 70–99)
Glucose-Capillary: 114 mg/dL — ABNORMAL HIGH (ref 70–99)

## 2018-09-01 LAB — MAGNESIUM: Magnesium: 2 mg/dL (ref 1.7–2.4)

## 2018-09-01 LAB — CBC
HCT: 30.4 % — ABNORMAL LOW (ref 36.0–46.0)
Hemoglobin: 10.4 g/dL — ABNORMAL LOW (ref 12.0–15.0)
MCH: 32.9 pg (ref 26.0–34.0)
MCHC: 34.2 g/dL (ref 30.0–36.0)
MCV: 96.2 fL (ref 80.0–100.0)
Platelets: 179 10*3/uL (ref 150–400)
RBC: 3.16 MIL/uL — ABNORMAL LOW (ref 3.87–5.11)
RDW: 20.2 % — ABNORMAL HIGH (ref 11.5–15.5)
WBC: 8.1 10*3/uL (ref 4.0–10.5)
nRBC: 0.2 % (ref 0.0–0.2)

## 2018-09-01 LAB — APTT: aPTT: 92 seconds — ABNORMAL HIGH (ref 24–36)

## 2018-09-01 LAB — TRIGLYCERIDES: Triglycerides: 136 mg/dL (ref <150)

## 2018-09-01 MED ORDER — PRISMASOL BGK 4/2.5 32-4-2.5 MEQ/L REPLACEMENT SOLN
Status: DC
  Administered 2018-09-01 – 2018-09-04 (×5): via INTRAVENOUS_CENTRAL
  Filled 2018-09-01 (×8): qty 5000

## 2018-09-01 MED ORDER — PROPOFOL 1000 MG/100ML IV EMUL
5.0000 ug/kg/min | INTRAVENOUS | Status: DC
  Administered 2018-09-01: 5 ug/kg/min via INTRAVENOUS
  Administered 2018-09-02: 7.5 ug/kg/min via INTRAVENOUS
  Administered 2018-09-02: 5.5 ug/kg/min via INTRAVENOUS
  Administered 2018-09-03: 15 ug/kg/min via INTRAVENOUS
  Administered 2018-09-03 – 2018-09-04 (×3): 10 ug/kg/min via INTRAVENOUS
  Filled 2018-09-01 (×7): qty 100

## 2018-09-01 NOTE — Progress Notes (Signed)
Patient ID: Anna [name], female   DOB: [birth year], 58 y.o.   MRN: [id number] S: Did well overnight and is more awake and alert O:BP (!) 141/106   Pulse 69   Temp 98.4 F (36.9 C) (Axillary)   Resp 16   Ht '5\' 6"'$  (1.676 m)   Wt 84.2 kg   LMP 07/07/2002 (Exact Date)   SpO2 96%   BMI 29.96 kg/m   Intake/Output Summary (Last 24 hours) at 09/01/2018 1053 Last data filed at 09/01/2018 1000 Gross per 24 hour  Intake 2507.14 ml  Output 3747 ml  Net -1239.86 ml   Intake/Output: I/O last 3 completed shifts: In: 3545.1 [I.V.:1297.1; NG/GT:2048; IV HYIFOYDXA:128] Out: 7867 [EHMCN:4709; Other:2158]  Intake/Output this shift:  Total I/O In: 263 [I.V.:83; NG/GT:180] Out: 449 [Urine:90; Other:359] Weight change:  Gen: intubated but awake CVS: no rub Resp: cta Abd: distended, +BS, soft, NT Ext: 1+ edema  Recent Labs  Lab 08/29/18 0747  08/30/18 0458  08/31/18 0243 08/31/18 0350 08/31/18 1227 08/31/18 1722 08/31/18 2256 09/01/18 0333 09/01/18 0435  NA 139   < > 140   < > 139 140 139 140 139 139 134*  K 4.0   < > 5.0   < > 5.6* 4.8 4.5 3.8 3.4* 3.5 3.3*  CL 112*  --  112*  --  113*  --  114* 109  --  98  --   CO2 15*  --  14*  --  13*  --  15* 22  --  29  --   GLUCOSE 120*  --  122*  --  139*  --  163* 138*  --  147*  --   BUN 47*  --  59*  --  87*  --  94* 75*  --  58*  --   CREATININE 3.13*  --  3.28*  --  3.42*  --  3.19* 2.30*  --  1.70*  --   ALBUMIN 2.2*  --   --   --  1.9*  --   --  1.8*  --  1.7*  --   CALCIUM 8.2*  --  7.8*  --  7.3*  --  7.4* 7.3*  --  6.9*  --   PHOS  --   --  8.0*  --  8.3*  --   --  5.8*  --  3.4  --   AST 15  --   --   --   --   --   --   --   --   --   --   ALT 11  --   --   --   --   --   --   --   --   --   --    < > = values in this interval not displayed.   Liver Function Tests: Recent Labs  Lab 08/29/18 0747 08/31/18 0243 08/31/18 1722 09/01/18 0333  AST 15  --   --   --   ALT 11  --   --   --   ALKPHOS 69  --   --   --   BILITOT  0.5  --   --   --   PROT 6.0*  --   --   --   ALBUMIN 2.2* 1.9* 1.8* 1.7*   No results for input(s): LIPASE, AMYLASE in the last 168 hours. No results for input(s): AMMONIA in the last 168 hours. CBC: Recent Labs  Lab 08/29/18 0747  08/30/18 0458  08/31/18 0745 08/31/18 2256 09/01/18 0333 09/01/18 0435  WBC 12.1*  --  9.9  --  7.7  --  8.1  --   NEUTROABS 11.1*  --   --   --   --   --   --   --   HGB 7.1*   < > 10.0*   < > 10.8* 11.2* 10.4* 12.2  HCT 21.2*   < > 29.1*   < > 32.0* 33.0* 30.4* 36.0  MCV 112.2*  --  100.7*  --  99.4  --  96.2  --   PLT 213  --  178  --  179  --  179  --    < > = values in this interval not displayed.   Cardiac Enzymes: Recent Labs  Lab 08/29/18 0941  TROPONINI 0.07*   CBG: Recent Labs  Lab 08/31/18 1700 08/31/18 1952 09/01/18 0010 09/01/18 0353 09/01/18 0758  GLUCAP 116* 146* 150* 137* 121*    Iron Studies: No results for input(s): IRON, TIBC, TRANSFERRIN, FERRITIN in the last 72 hours. Studies/Results: US Renal  Result Date: 08/30/2018 CLINICAL DATA:  Acute renal failure.  Hypertension, kidney stones. EXAM: RENAL / URINARY TRACT ULTRASOUND COMPLETE COMPARISON:  None. FINDINGS: Right Kidney: Renal measurements: 12.3 x 5.8 x 5 cm = volume: 186 mL. Renal cortex is diffusely echogenic. Small cysts within the upper pole, largest measuring 1.6 cm. No suspicious mass or hydronephrosis. Left Kidney: Renal measurements: 10.4 x 5.4 x 5 cm = volume: 148 mL. Renal cortex is diffusely echogenic. Mildly complicated cyst within the lower pole measures 1.8 cm. No suspicious mass or hydronephrosis. Bladder: Appears normal for degree of bladder distention. Ascites noted within the abdomen and pelvis. IMPRESSION: 1. Both kidneys are diffusely echogenic indicating medical renal disease. No suspicious mass or hydronephrosis bilaterally. 2. Ascites. Electronically Signed   By: Franki Cabot M.D.   On: 08/30/2018 20:03   Dg Chest Port 1 View  Result Date:  09/01/2018 CLINICAL DATA:  Check endotracheal tube placement EXAM: PORTABLE CHEST 1 VIEW COMPARISON:  08/31/2018 FINDINGS: Cardiac shadow remains enlarged. Endotracheal tube and gastric catheter are again seen and stable. Temporary dialysis catheter via the right jugular is again seen and stable. No pneumothorax is noted. Increasing consolidation in the right base is noted. Increasing consolidation but to a lesser degree is noted in the left base as well. No bony abnormality is noted. IMPRESSION: Increasing right basilar consolidation with stable left basilar infiltrates. Electronically Signed   By: Inez Catalina M.D.   On: 09/01/2018 08:27   Dg Chest Port 1 View  Result Date: 08/31/2018 CLINICAL DATA:  Status post dialysis catheter placement today. EXAM: PORTABLE CHEST 1 VIEW COMPARISON:  Single-view of the chest earlier today. FINDINGS: New right IJ approach double lumen central venous catheter is in place with its tip in the mid to lower superior vena cava. ETT and NG tube are unchanged. No pneumothorax. Right worse than left effusions and airspace disease persist. Heart size is enlarged. IMPRESSION: Right IJ approach central venous catheter tip is in the mid to lower superior vena cava. No pneumothorax. No change in right greater than left effusions and airspace disease. Electronically Signed   By: Inge Rise M.D.   On: 08/31/2018 14:32   Dg Chest Port 1 View  Result Date: 08/31/2018 CLINICAL DATA:  ETT present,,resp failure EXAM: PORTABLE CHEST 1 VIEW COMPARISON:  Chest x-rays dated 08/30/2018 and 08/29/2018 FINDINGS: Endotracheal tube  is adequately positioned with tip just below the level of the clavicles, approximately 5 cm above the carina. Additional coiled catheter overlying the endotracheal tube, of uncertain etiology. Enteric tube passes below the diaphragm. Heart size and mediastinal contours appear stable. Persistent RIGHT perihilar opacity, not significantly changed in the interval,  superimposed on a background of emphysematous change. No pleural effusion or pneumothorax seen. IMPRESSION: 1. Endotracheal tube is adequately positioned with tip just below the level of the clavicles, approximately 5 cm above the carina. 2. Additional coiled catheter overlying the endotracheal tube, of uncertain etiology. Recommend clinical correlation. 3. Persistent RIGHT perihilar opacity, not significantly changed in the interval, suspected pneumonia superimposed on a background of emphysematous change. Recent chest CT of 07/26/2018 suggested the possibility of RIGHT perihilar mass with postobstructive infiltrate. Electronically Signed   By: Franki Cabot M.D.   On: 08/31/2018 09:07   Dg Abd Portable 1v  Result Date: 08/31/2018 CLINICAL DATA:  Orogastric tube placement. EXAM: PORTABLE ABDOMEN - 1 VIEW COMPARISON:  None. FINDINGS: Enteric tube is well positioned within the stomach with tip directed towards the stomach pylorus/duodenal bulb. Moderate amount of bowel gas and stool within the visualized portion of the colon. No evidence of free intraperitoneal air. IMPRESSION: Enteric tube well positioned within the stomach with tip directed towards the stomach pylorus/duodenal bulb. Electronically Signed   By: Franki Cabot M.D.   On: 08/31/2018 09:08   . chlorhexidine gluconate (MEDLINE KIT)  15 mL Mouth Rinse BID  . Chlorhexidine Gluconate Cloth  6 each Topical Daily  . folic acid  1 mg Oral Daily  . heparin  5,000 Units Subcutaneous Q8H  . hydrocortisone sod succinate (SOLU-CORTEF) inj  50 mg Intravenous Q6H  . hydroxychloroquine  200 mg Oral Daily  . insulin aspart  0-15 Units Subcutaneous Q4H  . levothyroxine  50 mcg Oral Q0600  . mouth rinse  15 mL Mouth Rinse 10 times per day  . midazolam  2 mg Intravenous Once  . pantoprazole (PROTONIX) IV  40 mg Intravenous QHS  . QUEtiapine  400 mg Oral QHS  . sodium chloride flush  10-40 mL Intracatheter Q12H  . vitamin B-12  1,000 mcg Oral Daily     BMET    Component Value Date/Time   NA 134 (L) 09/01/2018 0435   K 3.3 (L) 09/01/2018 0435   CL 98 09/01/2018 0333   CO2 29 09/01/2018 0333   GLUCOSE 147 (H) 09/01/2018 0333   BUN 58 (H) 09/01/2018 0333   CREATININE 1.70 (H) 09/01/2018 0333   CREATININE 1.17 (H) 01/10/2013 1234   CALCIUM 6.9 (L) 09/01/2018 0333   GFRNONAA 32 (L) 09/01/2018 0333   GFRAA 38 (L) 09/01/2018 0333   CBC    Component Value Date/Time   WBC 8.1 09/01/2018 0333   RBC 3.16 (L) 09/01/2018 0333   HGB 12.2 09/01/2018 0435   HCT 36.0 09/01/2018 0435   PLT 179 09/01/2018 0333   MCV 96.2 09/01/2018 0333   MCH 32.9 09/01/2018 0333   MCHC 34.2 09/01/2018 0333   RDW 20.2 (H) 09/01/2018 0333   LYMPHSABS 0.3 (L) 08/29/2018 0747   MONOABS 0.5 08/29/2018 0747   EOSABS 0.0 08/29/2018 0747   BASOSABS 0.0 08/29/2018 0747    Assessment/Plan: 1. AKI/CKD stage 3 in setting of sepsis related to pneumonia. She was hypotensive overnight and likely ischemic ATN. She has received 5 liters of LR over the past 24 hours and has not had any significant UOP.  1. bladder scan with  568m so she does have some urinary retention s/p foley. 2. Renal UKoreaconsistent with chronic medical renal disease but no obstruction or masses. 3. Remains oliguric and worsening metabolic acidosis and hyperkalemia and started CVVHD 08/31/18 4. Will change post-filter fluid from isotonic bicarb to prismasate 4K/2.5Ca due to correction of hyperkalemia and met acidosis 5. UA with some blood and protein, no history of kidney involvement of lupus in the past. complement levels are WNL so doubt lupus flare..  2. Sepsis due to pneumonia in immunocompromised patient. Not yet on pressors 3. VDRF- per PCCM 4. Metabolic adisosis - due to #1. improved with CVVHD  5. Pulmonary HTN- per PCCM 6. Volume overload- tolerating UF with CVVHD.  Will increase rate uf to 1028mhr 7. AMS- likely due to sepsis and hyoxia 8. Anemia- transfuse prn. Felt to be  related to SLE 9. 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 10. SLE- agree with stress dose steroids and holding imuran.  JoDonetta PottsMD [phone]

## 2018-09-01 NOTE — Progress Notes (Addendum)
Spoke with Dr. Elsworth Soho regarding patients morning ABG. No changes made. Told to wait until morning rounds for potential changes to CRRT. Spoke with RT regarding ABG; RT increased FiO2 to 70%. Continuing to monitor.

## 2018-09-01 NOTE — Progress Notes (Signed)
NAME:  Anna [name], MRN:  [id number], DOB:  1961-03-17, LOS: 3 ADMISSION DATE:  08/29/2018, CONSULTATION DATE:  2/23 REFERRING MD:  Venora Maples, CHIEF COMPLAINT:  Acute hypoxic respiratory failure and Pneumonia   Brief History   58 year old female patient on Imuran, Plaquenil, and prednisone for history of lupus.  Admitted 2/23 with pneumonia and respiratory failure requiring noninvasive positive pressure ventilation.  Admitting to intensive care given high risk for clinical decompensation  History of present illness   58 year old female with history of lupus, on Imuran, Plaquenil, and prednisone at baseline, seen in our office 2/18 for routine pulmonary follow-up, no acute pulmonary symptoms at that point.  Presented to the emergency room on 2/23 with 24-hour history of cough, myalgias, worsening shortness of breath, and altered sensorium.  EMS called, temperature 101.  On arrival found to be tachypneic, hypoxic With room air saturations 85%, and also new marked right-sided infiltrate.  Was placed on noninvasive positive pressure ventilation with improvement in work of breathing however did remain tachypneic and tachycardic.  Pulmonary admitting to intensive care given high risk of clinical decompensation  Past Medical History  Lupus on Imuran and Plaquenil as well as prednisone 5 mg daily.  Lupus primarily affecting joints, no evidence of pulmonary involvement as of yet.  Pulmonary hypertension, tobacco abuse, hypertension, hyperlipidemia, depression, prior CVA, admitted in February 2018 for pneumonia with parapneumonic effusion, also just discharged from hospitalization in January 2024 benzodiazepine overdose  Significant Hospital Events   2/23: Presented the emergency room With 24-hour history of cough, myalgias, fever as high as 101 and altered sensorium chest x-ray demonstrating pulmonary infiltrate on the right, hypoxic and tachypneic requiring BiPAP support critical care admitting  Consults:     Procedures:    Significant Diagnostic Tests:    Micro Data:  Respiratory viral panel 2/23>>>Pending Flu A and B 2/23>>>Negative Urine strep antigen 2/23>>>Pending Urine Legionella antigen 2/23>>>Pending Sputum culture 2/23>>> Blood culture 2/23>>  Antimicrobials:  Vancomycin 2/23>>> Zosyn 2/23>>> Levaquin 2/23>>>  Interim history/subjective:  No events overnight, O2 and PEEP demand are improving  Objective   Blood pressure 114/82, pulse 61, temperature 98.4 F (36.9 C), temperature source Axillary, resp. rate 16, height 5\' 6"  (1.676 m), weight 84.2 kg, last menstrual period 07/07/2002, SpO2 93 %.    Vent Mode: PRVC FiO2 (%):  [50 %-80 %] 70 % Set Rate:  [20 bmp-24 bmp] 20 bmp Vt Set:  [490 mL] 490 mL PEEP:  [5 cmH20-8 cmH20] 8 cmH20 Plateau Pressure:  [12 cmH20-22 cmH20] 12 cmH20   Intake/Output Summary (Last 24 hours) at 09/01/2018 1128 Last data filed at 09/01/2018 1100 Gross per 24 hour  Intake 2599.33 ml  Output 3952 ml  Net -1352.67 ml   Filed Weights   08/29/18 0740 08/30/18 0355 09/01/18 0415  Weight: 78.4 kg 87.2 kg 84.2 kg   Examination: General: Acutely ill appearing female HENT: /AT, PERRL, EOM-I and MMM Lungs: Coarse BS diffusely Cardiovascular: RRR, Nl S1/S2 and -M/R/G Abdomen: Soft, NT, ND and +BS Extremities: Trace lower extremity edema brisk cap refill strong pulses Neuro: Arousable and following commands Skin: Intact  I reviewed CXR myself, ETT in a good place with infiltrate noted  Resolved Hospital Problem list     Assessment & Plan:  Acute hypoxic respiratory failure in the setting of pneumonia.  She is immunocompromised on Imuran and prednisone Portable chest x-ray personally reviewed this demonstrates severe right sided infiltrate Plan Continue full vent support Increase PEEP to 12 F/U on cultures Hold  Imuran KVO IVF Hold diurese Zithromax, cefepime, vanc as ordered until cultures result  Severe sepsis secondary to  pneumonia -Was volume responsive in the emergency room however did present with systolic pressure in the 67T.  Lactic acid was normal Plan KVO IVF Continue stress dose steroids given long term steroid use Antibiotics as mentioned above  Pulmonary hypertension Plan Telemetry monitoring Avoiding hypoxia  Acute metabolic encephalopathy in the setting of sepsis and hypoxia Plan Continue to hold off sedation Supportive care She is chronically on Seroquel, as well as Lexapro and as needed Valium.  We will need to reevaluate this daily, but holding for now given sedation for vent support  Acute on chronic renal failure stage III; appears as though baseline serum creatinine around 1.9 Plan KVO IVF CRRT per renal, volume negative Renal dose medications Strict intake output Follow-up blood chemistry  Fluid electrolyte balance/acid-base imbalance: Mild hyperchloremia, non-anion gap metabolic acidosis Plan KVO IVF Serial chemistries  Macrocytic anemia anemia, 1 month ago hemoglobin 7.9, currently 7.1, she is followed by hematology at [facility], felt secondary to her lupus Plan Trending CBC Transfuse for hemoglobin less than 7  Hilar lung mass Plan Follow-up outpatient PET scan  Hypothyroidism Plan Continue supplementation  Nicotine dependence Plan Smoking cessation  History of lupus Plan Holding Imuran Follow-up rheumatology  Husband updated over the phone  Best practice:  Diet: N.p.o. Pain/Anxiety/Delirium protocol (if indicated): Not applicable VAP protocol (if indicated): Not applicable DVT prophylaxis: Subcutaneous heparin GI prophylaxis: PPI Glucose control: Sliding scale insulin Mobility: Bedrest Code Status: Full code Family Communication: Pending Disposition: Critically ill due to high risk of progressive respiratory failure admitting to intensive care for close observation, and high concern about need for intubation  Labs   CBC: Recent Labs  Lab  08/29/18 0747  08/30/18 0458  08/31/18 0350 08/31/18 0745 08/31/18 2256 09/01/18 0333 09/01/18 0435  WBC 12.1*  --  9.9  --   --  7.7  --  8.1  --   NEUTROABS 11.1*  --   --   --   --   --   --   --   --   HGB 7.1*   < > 10.0*   < > 10.2* 10.8* 11.2* 10.4* 12.2  HCT 21.2*   < > 29.1*   < > 30.0* 32.0* 33.0* 30.4* 36.0  MCV 112.2*  --  100.7*  --   --  99.4  --  96.2  --   PLT 213  --  178  --   --  179  --  179  --    < > = values in this interval not displayed.    Basic Metabolic Panel: Recent Labs  Lab 08/30/18 0458  08/31/18 0243  08/31/18 1227 08/31/18 1722 08/31/18 2256 09/01/18 0333 09/01/18 0435  NA 140   < > 139   < > 139 140 139 139 134*  K 5.0   < > 5.6*   < > 4.5 3.8 3.4* 3.5 3.3*  CL 112*  --  113*  --  114* 109  --  98  --   CO2 14*  --  13*  --  15* 22  --  29  --   GLUCOSE 122*  --  139*  --  163* 138*  --  147*  --   BUN 59*  --  87*  --  94* 75*  --  58*  --   CREATININE 3.28*  --  3.42*  --  3.19* 2.30*  --  1.70*  --   CALCIUM 7.8*  --  7.3*  --  7.4* 7.3*  --  6.9*  --   MG 1.8  --  2.1  --   --   --   --  2.0  --   PHOS 8.0*  --  8.3*  --   --  5.8*  --  3.4  --    < > = values in this interval not displayed.   GFR: Estimated Creatinine Clearance: 39 mL/min (A) (by C-G formula based on SCr of 1.7 mg/dL (H)). Recent Labs  Lab 08/29/18 0747 08/29/18 0814 08/29/18 0941 08/29/18 0943 08/30/18 0458 08/31/18 0243 08/31/18 0745 09/01/18 0333  PROCALCITON  --   --  5.92  --  15.47 12.98  --   --   WBC 12.1*  --   --   --  9.9  --  7.7 8.1  LATICACIDVEN  --  1.4  --  1.3  --   --   --   --     Liver Function Tests: Recent Labs  Lab 08/29/18 0747 08/31/18 0243 08/31/18 1722 09/01/18 0333  AST 15  --   --   --   ALT 11  --   --   --   ALKPHOS 69  --   --   --   BILITOT 0.5  --   --   --   PROT 6.0*  --   --   --   ALBUMIN 2.2* 1.9* 1.8* 1.7*   No results for input(s): LIPASE, AMYLASE in the last 168 hours. No results for input(s):  AMMONIA in the last 168 hours.  ABG    Component Value Date/Time   PHART 7.483 (H) 09/01/2018 0435   PCO2ART 42.7 09/01/2018 0435   PO2ART 57.0 (L) 09/01/2018 0435   HCO3 32.0 (H) 09/01/2018 0435   TCO2 33 (H) 09/01/2018 0435   ACIDBASEDEF 12.0 (H) 08/31/2018 0350   O2SAT 91.0 09/01/2018 0435     Coagulation Profile: Recent Labs  Lab 08/29/18 0747  INR 1.46    Cardiac Enzymes: Recent Labs  Lab 08/29/18 0941  TROPONINI 0.07*    HbA1C: No results found for: HGBA1C  CBG: Recent Labs  Lab 08/31/18 1700 08/31/18 1952 09/01/18 0010 09/01/18 0353 09/01/18 0758  GLUCAP 116* 146* 150* 137* 121*   The patient is critically ill with multiple organ systems failure and requires high complexity decision making for assessment and support, frequent evaluation and titration of therapies, application of advanced monitoring technologies and extensive interpretation of multiple databases.   Critical Care Time devoted to patient care services described in this note is  32  Minutes. This time reflects time of care of this signee Dr Jennet Maduro. This critical care time does not reflect procedure time, or teaching time or supervisory time of PA/NP/Med student/Med Resident etc but could involve care discussion time.  Rush Farmer, M.D. [facility] Pulmonary/Critical Care Medicine. Pager: [phone]. After hours pager: [phone].

## 2018-09-01 NOTE — Progress Notes (Signed)
Plan per CCM was to use fentanyl as sedation and decrease precedex, as it was causing patient to have issues with bradycardia. Throughout this shift RN has been continuously increasing fentanyl AND precedex, but patient still remains extremely agitated. She is coughing, fighting the ventilator, biting ETT despite having a bite block in place, and attempting to pull at tubes. Safety mittens in place. Patient given multiple boluses of fentanyl and received prn versed. Patient is also becoming increasingly hypertensive with SBP 150-160's. Dr. Nelda Marseille notified. New orders received to transition from precedex to propofol. Will attempt this and continue to monitor.  Joellen Jersey, RN

## 2018-09-01 NOTE — Procedures (Deleted)
Percutaneous Tracheostomy Placement  Consent from family.  Patient sedated, paralyzed and position.  Placed on 100% FiO2 and RR matched.  Area cleaned and draped.  Lidocaine/epi injected.  Skin incision done followed by blunt dissection.  Trachea palpated then punctured, catheter passed and visualized bronchoscopically.  Wire placed and visualized.  Catheter removed.  Airway then entered and dilated.  Size 6 cuffed shiley trach placed and visualized bronchoscopically well above carina.  Good volume returns.  Patient tolerated the procedure well without complications.  Minimal blood loss.  CXR ordered and pending.  Jaculin Rasmus G. Chiara Coltrin, M.D. [place] Pulmonary/Critical Care Medicine. Pager: [phone]. After hours pager: [phone].  

## 2018-09-02 DIAGNOSIS — A481 Legionnaires' disease: Secondary | ICD-10-CM

## 2018-09-02 DIAGNOSIS — N170 Acute kidney failure with tubular necrosis: Secondary | ICD-10-CM

## 2018-09-02 DIAGNOSIS — N183 Chronic kidney disease, stage 3 (moderate): Secondary | ICD-10-CM

## 2018-09-02 LAB — RENAL FUNCTION PANEL
Albumin: 2.2 g/dL — ABNORMAL LOW (ref 3.5–5.0)
Albumin: 2.3 g/dL — ABNORMAL LOW (ref 3.5–5.0)
Anion gap: 11 (ref 5–15)
Anion gap: 9 (ref 5–15)
BUN: 35 mg/dL — ABNORMAL HIGH (ref 6–20)
BUN: 34 mg/dL — ABNORMAL HIGH (ref 6–20)
CO2: 26 mmol/L (ref 22–32)
CO2: 27 mmol/L (ref 22–32)
CREATININE: 0.99 mg/dL (ref 0.44–1.00)
Calcium: 7.9 mg/dL — ABNORMAL LOW (ref 8.9–10.3)
Calcium: 8.3 mg/dL — ABNORMAL LOW (ref 8.9–10.3)
Chloride: 99 mmol/L (ref 98–111)
Chloride: 99 mmol/L (ref 98–111)
Creatinine, Ser: 1.11 mg/dL — ABNORMAL HIGH (ref 0.44–1.00)
GFR calc Af Amer: >60 mL/min (ref >60)
GFR calc non Af Amer: 54 mL/min — ABNORMAL LOW (ref >60)
GFR calc non Af Amer: >60 mL/min (ref >60)
Glucose, Bld: 146 mg/dL — ABNORMAL HIGH (ref 70–99)
Glucose, Bld: 155 mg/dL — ABNORMAL HIGH (ref 70–99)
Phosphorus: 2.8 mg/dL (ref 2.5–4.6)
Phosphorus: 2.6 mg/dL (ref 2.5–4.6)
Potassium: 4.2 mmol/L (ref 3.5–5.1)
Potassium: 4.3 mmol/L (ref 3.5–5.1)
Sodium: 136 mmol/L (ref 135–145)
Sodium: 135 mmol/L (ref 135–145)

## 2018-09-02 LAB — POCT I-STAT 7, (LYTES, BLD GAS, ICA,H+H)
Acid-Base Excess: 1 mmol/L (ref 0.0–2.0)
Bicarbonate: 28.4 mmol/L — ABNORMAL HIGH (ref 20.0–28.0)
Calcium, Ion: 1.13 mmol/L — ABNORMAL LOW (ref 1.15–1.40)
HCT: 52 % — ABNORMAL HIGH (ref 36.0–46.0)
Hemoglobin: 17.7 g/dL — ABNORMAL HIGH (ref 12.0–15.0)
O2 Saturation: 98 %
Patient temperature: 97.4
Potassium: 4.1 mmol/L (ref 3.5–5.1)
Sodium: 134 mmol/L — ABNORMAL LOW (ref 135–145)
TCO2: 30 mmol/L (ref 22–32)
pCO2 arterial: 50.5 mmHg — ABNORMAL HIGH (ref 32.0–48.0)
pH, Arterial: 7.354 (ref 7.350–7.450)
pO2, Arterial: 104 mmHg (ref 83.0–108.0)

## 2018-09-02 LAB — POCT I-STAT EG7
Acid-Base Excess: 3 mmol/L — ABNORMAL HIGH (ref 0.0–2.0)
Bicarbonate: 31.4 mmol/L — ABNORMAL HIGH (ref 20.0–28.0)
Calcium, Ion: 1.12 mmol/L — ABNORMAL LOW (ref 1.15–1.40)
HCT: 38 % (ref 36.0–46.0)
Hemoglobin: 12.9 g/dL (ref 12.0–15.0)
O2 Saturation: 65 %
PO2 VEN: 38 mmHg (ref 32.0–45.0)
Potassium: 4.2 mmol/L (ref 3.5–5.1)
Sodium: 137 mmol/L (ref 135–145)
TCO2: 33 mmol/L — ABNORMAL HIGH (ref 22–32)
pCO2, Ven: 63.9 mmHg — ABNORMAL HIGH (ref 44.0–60.0)
pH, Ven: 7.3 (ref 7.250–7.430)

## 2018-09-02 LAB — POCT ACTIVATED CLOTTING TIME
ACTIVATED CLOTTING TIME: 197 s
ACTIVATED CLOTTING TIME: 197 s
ACTIVATED CLOTTING TIME: 202 s
ACTIVATED CLOTTING TIME: 202 s
ACTIVATED CLOTTING TIME: 224 s
Activated Clotting Time: 230 seconds
Activated Clotting Time: 230 seconds
Activated Clotting Time: 219 seconds
Activated Clotting Time: 213 seconds
Activated Clotting Time: 191 seconds

## 2018-09-02 LAB — MAGNESIUM: Magnesium: 2.6 mg/dL — ABNORMAL HIGH (ref 1.7–2.4)

## 2018-09-02 LAB — APTT: aPTT: 161 seconds (ref 24–36)

## 2018-09-02 LAB — GLUCOSE, CAPILLARY
Glucose-Capillary: 113 mg/dL — ABNORMAL HIGH (ref 70–99)
Glucose-Capillary: 123 mg/dL — ABNORMAL HIGH (ref 70–99)
Glucose-Capillary: 136 mg/dL — ABNORMAL HIGH (ref 70–99)
Glucose-Capillary: 138 mg/dL — ABNORMAL HIGH (ref 70–99)
Glucose-Capillary: 148 mg/dL — ABNORMAL HIGH (ref 70–99)
Glucose-Capillary: 151 mg/dL — ABNORMAL HIGH (ref 70–99)

## 2018-09-02 MED ORDER — ESCITALOPRAM OXALATE 10 MG PO TABS
20.0000 mg | ORAL_TABLET | Freq: Every day | ORAL | Status: DC
  Administered 2018-09-02 – 2018-09-06 (×5): 20 mg
  Filled 2018-09-02 (×5): qty 2

## 2018-09-02 NOTE — Progress Notes (Signed)
Patient ID: Anna [name], female   DOB: Jan 02, 1961, 58 y.o.   MRN: [id number] S: Difficulties with sedation overnight noted.  More relaxed and comfortable this am.  She is off of pressors O:BP 108/81   Pulse 62   Temp (!) 97.1 F (36.2 C) (Axillary)   Resp 16   Ht _0  (1.676 m)   Wt 82.1 kg   LMP 07/07/2002 (Exact Date)   SpO2 100%   BMI 29.21 kg/m   Intake/Output Summary (Last 24 hours) at 09/02/2018 1118 Last data filed at 09/02/2018 1100 Gross per 24 hour  Intake 2705.47 ml  Output 5404 ml  Net -2698.53 ml   Intake/Output: I/O last 3 completed shifts: In: 4000.8 [I.V.:1530.8; NG/GT:2370; IV Piggyback:100] Out: 8182 [Urine:800; XHBZJ:6967]  Intake/Output this shift:  Total I/O In: 407.1 [I.V.:167.1; NG/GT:240] Out: 838 [Urine:40; Other:798] Weight change: -2.1 kg ELF:YBOFBPZWC and sedated CVS: no rub Resp: occ rhonchi Abd: +BS, soft, NT Ext: trace pretibial edema  Recent Labs  Lab 08/29/18 0747  08/30/18 0458  08/31/18 0243  08/31/18 1227 08/31/18 1722 08/31/18 2256 09/01/18 0333 09/01/18 0435 09/01/18 1510 09/01/18 1614 09/02/18 0409 09/02/18 0507  NA 139   < > 140   < > 139   < > 139 140 139 139 134* 138 138 136 137  K 4.0   < > 5.0   < > 5.6*   < > 4.5 3.8 3.4* 3.5 3.3* 3.7 3.7 4.2 4.2  CL 112*  --  112*  --  113*  --  114* 109  --  98  --   --  98 99  --   CO2 15*  --  14*  --  13*  --  15* 22  --  29  --   --  29 26  --   GLUCOSE 120*  --  122*  --  139*  --  163* 138*  --  147*  --   --  146* 146*  --   BUN 47*  --  59*  --  87*  --  94* 75*  --  58*  --   --  43* 35*  --   CREATININE 3.13*  --  3.28*  --  3.42*  --  3.19* 2.30*  --  1.70*  --   --  1.40* 1.11*  --   ALBUMIN 2.2*  --   --   --  1.9*  --   --  1.8*  --  1.7*  --   --  2.1* 2.2*  --   CALCIUM 8.2*  --  7.8*  --  7.3*  --  7.4* 7.3*  --  6.9*  --   --  7.7* 7.9*  --   PHOS  --   --  8.0*  --  8.3*  --   --  5.8*  --  3.4  --   --  2.6 2.8  --   AST 15  --   --   --   --   --   --   --    --   --   --   --   --   --   --   ALT 11  --   --   --   --   --   --   --   --   --   --   --   --   --   --    < > =  values in this interval not displayed.   Liver Function Tests: Recent Labs  Lab 08/29/18 0747  09/01/18 0333 09/01/18 1614 09/02/18 0409  AST 15  --   --   --   --   ALT 11  --   --   --   --   ALKPHOS 69  --   --   --   --   BILITOT 0.5  --   --   --   --   PROT 6.0*  --   --   --   --   ALBUMIN 2.2*   < > 1.7* 2.1* 2.2*   < > = values in this interval not displayed.   No results for input(s): LIPASE, AMYLASE in the last 168 hours. No results for input(s): AMMONIA in the last 168 hours. CBC: Recent Labs  Lab 08/29/18 0747  08/30/18 0458  08/31/18 0745  09/01/18 0333 09/01/18 0435 09/01/18 1510 09/02/18 0507  WBC 12.1*  --  9.9  --  7.7  --  8.1  --   --   --   NEUTROABS 11.1*  --   --   --   --   --   --   --   --   --   HGB 7.1*   < > 10.0*   < > 10.8*   < > 10.4* 12.2 10.5* 12.9  HCT 21.2*   < > 29.1*   < > 32.0*   < > 30.4* 36.0 31.0* 38.0  MCV 112.2*  --  100.7*  --  99.4  --  96.2  --   --   --   PLT 213  --  178  --  179  --  179  --   --   --    < > = values in this interval not displayed.   Cardiac Enzymes: Recent Labs  Lab 08/29/18 0941  TROPONINI 0.07*   CBG: Recent Labs  Lab 09/01/18 1613 09/01/18 2002 09/02/18 0004 09/02/18 0411 09/02/18 0734  GLUCAP 129* 114* 151* 136* 148*    Iron Studies: No results for input(s): IRON, TIBC, TRANSFERRIN, FERRITIN in the last 72 hours. Studies/Results: Dg Chest Port 1 View  Result Date: 09/01/2018 CLINICAL DATA:  Check endotracheal tube placement EXAM: PORTABLE CHEST 1 VIEW COMPARISON:  08/31/2018 FINDINGS: Cardiac shadow remains enlarged. Endotracheal tube and gastric catheter are again seen and stable. Temporary dialysis catheter via the right jugular is again seen and stable. No pneumothorax is noted. Increasing consolidation in the right base is noted. Increasing consolidation but to a  lesser degree is noted in the left base as well. No bony abnormality is noted. IMPRESSION: Increasing right basilar consolidation with stable left basilar infiltrates. Electronically Signed   By: Inez Catalina M.D.   On: 09/01/2018 08:27   Dg Chest Port 1 View  Result Date: 08/31/2018 CLINICAL DATA:  Status post dialysis catheter placement today. EXAM: PORTABLE CHEST 1 VIEW COMPARISON:  Single-view of the chest earlier today. FINDINGS: New right IJ approach double lumen central venous catheter is in place with its tip in the mid to lower superior vena cava. ETT and NG tube are unchanged. No pneumothorax. Right worse than left effusions and airspace disease persist. Heart size is enlarged. IMPRESSION: Right IJ approach central venous catheter tip is in the mid to lower superior vena cava. No pneumothorax. No change in right greater than left effusions and airspace disease. Electronically Signed   By: Inge Rise  M.D.   On: 08/31/2018 14:32   . chlorhexidine gluconate (MEDLINE KIT)  15 mL Mouth Rinse BID  . Chlorhexidine Gluconate Cloth  6 each Topical Daily  . escitalopram  20 mg Per Tube Daily  . folic acid  1 mg Oral Daily  . heparin  5,000 Units Subcutaneous Q8H  . hydrocortisone sod succinate (SOLU-CORTEF) inj  50 mg Intravenous Q6H  . hydroxychloroquine  200 mg Oral Daily  . insulin aspart  0-15 Units Subcutaneous Q4H  . levothyroxine  50 mcg Oral Q0600  . mouth rinse  15 mL Mouth Rinse 10 times per day  . midazolam  2 mg Intravenous Once  . pantoprazole (PROTONIX) IV  40 mg Intravenous QHS  . QUEtiapine  400 mg Oral QHS  . sodium chloride flush  10-40 mL Intracatheter Q12H  . vitamin B-12  1,000 mcg Oral Daily    BMET    Component Value Date/Time   NA 137 09/02/2018 0507   K 4.2 09/02/2018 0507   CL 99 09/02/2018 0409   CO2 26 09/02/2018 0409   GLUCOSE 146 (H) 09/02/2018 0409   BUN 35 (H) 09/02/2018 0409   CREATININE 1.11 (H) 09/02/2018 0409   CREATININE 1.17 (H) 01/10/2013  1234   CALCIUM 7.9 (L) 09/02/2018 0409   GFRNONAA 54 (L) 09/02/2018 0409   GFRAA >60 09/02/2018 0409   CBC    Component Value Date/Time   WBC 8.1 09/01/2018 0333   RBC 3.16 (L) 09/01/2018 0333   HGB 12.9 09/02/2018 0507   HCT 38.0 09/02/2018 0507   PLT 179 09/01/2018 0333   MCV 96.2 09/01/2018 0333   MCH 32.9 09/01/2018 0333   MCHC 34.2 09/01/2018 0333   RDW 20.2 (H) 09/01/2018 0333   LYMPHSABS 0.3 (L) 08/29/2018 0747   MONOABS 0.5 08/29/2018 0747   EOSABS 0.0 08/29/2018 0747   BASOSABS 0.0 08/29/2018 0747    Assessment/Plan: 1. AKI/CKD stage 3 in setting of sepsis related to pneumonia. She was hypotensive overnight and likely ischemic ATN. She has received 5 liters of LR over the past 24 hours and has not had any significant UOP. 1. bladder scanwith 599m so she does have some urinary retention s/p foley. 2. Renal UKoreaconsistent with chronic medical renal disease but no obstruction or masses. 3. Remains oliguric and worsening metabolic acidosis and hyperkalemia and started CVVHD 08/31/18 4. Continue with prismasate 4K/2.5Ca due to correction of hyperkalemia and met acidosis 5. Hopefully will be able to transition to IHD over the next 24-48 hours 6. UA with some blood and protein, no history of kidney involvement of lupus in the past. complementlevels are WNL so doubt lupus flare..  2. Sepsis due to pneumonia in immunocompromised patient. Not on pressors 3. VDRF- per PCCM 4. Metabolic adisosis - due to #1. improved with CVVHD  5. Pulmonary HTN- per PCCM 6. Volume overload- tolerating UF with CVVHD.  Will increase rate uf to 1034mhr 7. AMS- likely due to sepsis and hyoxia 8. Anemia- transfuse prn. Felt to be related to SLE 9. 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 10. SLE- agree with stress dose steroids and holding imuran. 11. Volume management- will decrease rate of UF with CVVHD as she is negative almost 3 liters and uop has dropped.   JoDonetta PottsMD [phone]

## 2018-09-02 NOTE — Progress Notes (Signed)
NAME:  Anna [name], MRN:  [id number], DOB:  26-Jan-1961, LOS: 4 ADMISSION DATE:  08/29/2018, CONSULTATION DATE:  2/23 REFERRING MD:  Venora Maples, CHIEF COMPLAINT:  Acute hypoxic respiratory failure and Pneumonia   Brief History   58 year old female patient on Imuran, Plaquenil, and prednisone for history of lupus.  Admitted 2/23 with pneumonia and respiratory failure, intubated 2/23.   History of present illness   58 year old female with history of lupus, on Imuran, Plaquenil, and prednisone at baseline, seen in our office 2/18 for routine pulmonary follow-up, no acute pulmonary symptoms at that point.  Presented to the emergency room on 2/23 with 24-hour history of cough, myalgias, worsening shortness of breath, and altered sensorium.  EMS called, temperature 101.  On arrival found to be tachypneic, hypoxic With room air saturations 85%, and also new marked right-sided infiltrate.  Was placed on noninvasive positive pressure ventilation with improvement in work of breathing however did remain tachypneic and tachycardic.  Pulmonary admitting to intensive care given high risk of clinical decompensation  Past Medical History  Lupus on Imuran and Plaquenil as well as prednisone 5 mg daily.  Lupus primarily affecting joints, no evidence of pulmonary involvement as of yet.  Pulmonary hypertension, tobacco abuse, hypertension, hyperlipidemia, depression, prior CVA, admitted in February 2018 for pneumonia with parapneumonic effusion, also just discharged from hospitalization in January 2024 benzodiazepine overdose  Significant Hospital Events   2/23: Presented the emergency room With 24-hour history of cough, myalgias, fever as high as 101 and altered sensorium chest x-ray demonstrating pulmonary infiltrate on the right, hypoxic and tachypneic requiring BiPAP support critical care admitting  Consults:  Nephrology   Procedures:    Significant Diagnostic Tests:    Micro Data:  Respiratory viral  panel 2/23>>>negative  Flu A and B 2/23>>>Negative Urine strep antigen 2/23>>>Negative  Urine Legionella antigen 2/23>>>POSITIVE Sputum culture 2/23>>> Blood culture 2/23>> Urine cx 2/24 >> negative   Antimicrobials:  Levaquin 2/24>>>  Interim history/subjective:  Desaturation with bathing and turning early morning, FiO2 increased to 90%, PEEP remains 12  Objective   Blood pressure 94/64, pulse 73, temperature (!) 97.1 F (36.2 C), temperature source Axillary, resp. rate 16, height 5\' 6"  (1.676 m), weight 82.1 kg, last menstrual period 07/07/2002, SpO2 96 %. CVP:  [13 mmHg] 13 mmHg  Vent Mode: PRVC FiO2 (%):  [60 %-100 %] 90 % Set Rate:  [16 bmp] 16 bmp Vt Set:  [470 mL-490 mL] 470 mL PEEP:  [5 GHW29-93 cmH20] 5 cmH20 Plateau Pressure:  [17 cmH20-25 cmH20] 22 cmH20   Intake/Output Summary (Last 24 hours) at 09/02/2018 1053 Last data filed at 09/02/2018 1000 Gross per 24 hour  Intake 2696.28 ml  Output 5394 ml  Net -2697.72 ml   Filed Weights   08/30/18 0355 09/01/18 0415 09/02/18 0330  Weight: 87.2 kg 84.2 kg 82.1 kg   Examination: General: Ill-appearing woman, mechanically ventilated, obese HENT: Endotracheal tube in good position, pupils equal and react oropharynx otherwise clear Lungs: Coarse bilateral breath sounds, no wheezing, inspiratory crackles especially at right base Cardiovascular: Regular, distant, no murmur Abdomen: Soft, no distention, positive bowel sounds Extremities: Trace pretibial edema Neuro: Wakes to voice on current level of sedation, follows commands then quickly back to sleep Skin: No rash or breakdown noted   Resolved Hospital Problem list     Assessment & Plan:  Acute hypoxic respiratory failure in the setting of pneumonia.  She is immunocompromised on Imuran and prednisone Chest x-ray reviewed by me 2/27, continued bilateral  infiltrates right greater than left Plan PRVC, lung protective strategy Continue PEEP 12, wean FiO2.  Can start  working on decreasing PEEP once FiO2 approaches 50% Imuran on hold Volume removal via CVVH Currently on Levaquin monotherapy, cultures unrevealing, Legionella urine antigen positive  Severe sepsis secondary to pneumonia -Was volume responsive in the emergency room however did present with systolic pressure in the 17O.  Lactic acid was normal Plan KVO IVF Stress dose hydrocortisone as ordered Antibiotics as above  Pulmonary hypertension Plan Acute on chronic, following.  Wean FiO2 as able  Acute metabolic encephalopathy in the setting of sepsis and hypoxia Plan Propofol, fentanyl for adequate sedation, comfort while mechanically ventilated Home Seroquel ordered, okay to restart home Lexapro 2/27 at decreased dose  Acute on chronic renal failure stage III; appears as though baseline serum creatinine around 1.9 Plan IV fluids at [facility] nephrology assistance.  Suspect we will be able to transition off of CVVHD and to intermittent HD soon Volume removal as tolerated Renally dosing medications Follow BMP, urine output for possible renal recovery  Fluid electrolyte balance/acid-base imbalance: Mild hyperchloremia, non-anion gap metabolic acidosis Plan Follow BMP Replace electrolytes as indicated  Macrocytic anemia anemia, 1 month ago hemoglobin 7.9, currently 7.1, she is [facility] at [facility], felt secondary to her lupus Plan Follow CBC Transfusion goal hemoglobin greater than 7  Hilar lung mass Plan Planning for an outpatient PET scan, followed by Dr. [name].  May consider repeat CT chest if we feel that there is any contribution of a postobstructive process contributing to her current pneumonia  Hypothyroidism Plan Synthroid as ordered  Nicotine dependence Plan She will need work on smoking cessation  History of lupus Plan Imuran on hold, Plaquenil ordered   Best practice:  Diet: N.p.o. Pain/Anxiety/Delirium protocol (if indicated): yes,  fentanyl/ propofol VAP protocol (if indicated): Not applicable DVT prophylaxis: Subcutaneous heparin GI prophylaxis: PPI Glucose control: Sliding scale insulin Mobility: Bedrest Code Status: Full code Family Communication: none present 2/27 Disposition: ICU  Labs   CBC: Recent Labs  Lab 08/29/18 0747  08/30/18 0458  08/31/18 0745 08/31/18 2256 09/01/18 0333 09/01/18 0435 09/01/18 1510 09/02/18 0507  WBC 12.1*  --  9.9  --  7.7  --  8.1  --   --   --   NEUTROABS 11.1*  --   --   --   --   --   --   --   --   --   HGB 7.1*   < > 10.0*   < > 10.8* 11.2* 10.4* 12.2 10.5* 12.9  HCT 21.2*   < > 29.1*   < > 32.0* 33.0* 30.4* 36.0 31.0* 38.0  MCV 112.2*  --  100.7*  --  99.4  --  96.2  --   --   --   PLT 213  --  178  --  179  --  179  --   --   --    < > = values in this interval not displayed.    Basic Metabolic Panel: Recent Labs  Lab 08/30/18 0458  08/31/18 0243  08/31/18 1227 08/31/18 1722  09/01/18 0333 09/01/18 0435 09/01/18 1510 09/01/18 1614 09/02/18 0409 09/02/18 0507  NA 140   < > 139   < > 139 140   < > 139 134* 138 138 136 137  K 5.0   < > 5.6*   < > 4.5 3.8   < > 3.5 3.3* 3.7 3.7 4.2  4.2  CL 112*  --  113*  --  114* 109  --  98  --   --  98 99  --   CO2 14*  --  13*  --  15* 22  --  29  --   --  29 26  --   GLUCOSE 122*  --  139*  --  163* 138*  --  147*  --   --  146* 146*  --   BUN 59*  --  87*  --  94* 75*  --  58*  --   --  43* 35*  --   CREATININE 3.28*  --  3.42*  --  3.19* 2.30*  --  1.70*  --   --  1.40* 1.11*  --   CALCIUM 7.8*  --  7.3*  --  7.4* 7.3*  --  6.9*  --   --  7.7* 7.9*  --   MG 1.8  --  2.1  --   --   --   --  2.0  --   --   --  2.6*  --   PHOS 8.0*  --  8.3*  --   --  5.8*  --  3.4  --   --  2.6 2.8  --    < > = values in this interval not displayed.   GFR: Estimated Creatinine Clearance: 58.9 mL/min (A) (by C-G formula based on SCr of 1.11 mg/dL (H)). Recent Labs  Lab 08/29/18 0747 08/29/18 0814 08/29/18 0941 08/29/18 0943  08/30/18 0458 08/31/18 0243 08/31/18 0745 09/01/18 0333  PROCALCITON  --   --  5.92  --  15.47 12.98  --   --   WBC 12.1*  --   --   --  9.9  --  7.7 8.1  LATICACIDVEN  --  1.4  --  1.3  --   --   --   --     Liver Function Tests: Recent Labs  Lab 08/29/18 0747 08/31/18 0243 08/31/18 1722 09/01/18 0333 09/01/18 1614 09/02/18 0409  AST 15  --   --   --   --   --   ALT 11  --   --   --   --   --   ALKPHOS 69  --   --   --   --   --   BILITOT 0.5  --   --   --   --   --   PROT 6.0*  --   --   --   --   --   ALBUMIN 2.2* 1.9* 1.8* 1.7* 2.1* 2.2*   No results for input(s): LIPASE, AMYLASE in the last 168 hours. No results for input(s): AMMONIA in the last 168 hours.  ABG    Component Value Date/Time   PHART 7.456 (H) 09/01/2018 1510   PCO2ART 43.9 09/01/2018 1510   PO2ART 98.0 09/01/2018 1510   HCO3 31.4 (H) 09/02/2018 0507   TCO2 33 (H) 09/02/2018 0507   ACIDBASEDEF 12.0 (H) 08/31/2018 0350   O2SAT 65.0 09/02/2018 0507     Coagulation Profile: Recent Labs  Lab 08/29/18 0747  INR 1.46    Cardiac Enzymes: Recent Labs  Lab 08/29/18 0941  TROPONINI 0.07*    HbA1C: No results found for: HGBA1C  CBG: Recent Labs  Lab 09/01/18 1613 09/01/18 2002 09/02/18 0004 09/02/18 0411 09/02/18 0734  GLUCAP 129* 114* 151* 136* 148*   The patient is critically  ill with multiple organ systems failure and requires high complexity decision making for assessment and support, frequent evaluation and titration of therapies, application of advanced monitoring technologies and extensive interpretation of multiple databases.   Independent critical care time 35 minutes  Baltazar Apo, MD, PhD 09/02/2018, 11:16 AM [facility] [phone] or if no answer [phone]

## 2018-09-02 NOTE — Progress Notes (Signed)
CRITICAL VALUE ALERT  Critical Value:  [address]   Date & Time Notied:  09/02/2018 at [place]  Provider Notified: Warren Lacy RN (Abby) to report to MD  Orders Received/Actions taken: No new orders

## 2018-09-02 NOTE — Progress Notes (Signed)
RT unable to remove patients oral airway acting as a bite block due to patients teeth being clamped down on it. Patient does appear to be comfortable and not agitated but RT is still unable to remove it without causing damage to the teeth.

## 2018-09-03 ENCOUNTER — Ambulatory Visit (HOSPITAL_COMMUNITY): Admission: RE | Admit: 2018-09-03 | Payer: BLUE CROSS/BLUE SHIELD | Source: Ambulatory Visit

## 2018-09-03 ENCOUNTER — Inpatient Hospital Stay (HOSPITAL_COMMUNITY): Payer: BLUE CROSS/BLUE SHIELD

## 2018-09-03 LAB — MAGNESIUM: Magnesium: 2.5 mg/dL — ABNORMAL HIGH (ref 1.7–2.4)

## 2018-09-03 LAB — CBC
HCT: 33.9 % — ABNORMAL LOW (ref 36.0–46.0)
Hemoglobin: 11.2 g/dL — ABNORMAL LOW (ref 12.0–15.0)
MCH: 33.9 pg (ref 26.0–34.0)
MCHC: 33 g/dL (ref 30.0–36.0)
MCV: 102.7 fL — ABNORMAL HIGH (ref 80.0–100.0)
Platelets: 183 10*3/uL (ref 150–400)
RBC: 3.3 MIL/uL — ABNORMAL LOW (ref 3.87–5.11)
RDW: 19 % — ABNORMAL HIGH (ref 11.5–15.5)
WBC: 8 10*3/uL (ref 4.0–10.5)
nRBC: 0 % (ref 0.0–0.2)

## 2018-09-03 LAB — RENAL FUNCTION PANEL
Albumin: 2.2 g/dL — ABNORMAL LOW (ref 3.5–5.0)
Albumin: 2.1 g/dL — ABNORMAL LOW (ref 3.5–5.0)
Anion gap: 9 (ref 5–15)
Anion gap: 7 (ref 5–15)
BUN: 32 mg/dL — ABNORMAL HIGH (ref 6–20)
BUN: 37 mg/dL — ABNORMAL HIGH (ref 6–20)
CO2: 27 mmol/L (ref 22–32)
CO2: 26 mmol/L (ref 22–32)
Calcium: 8.4 mg/dL — ABNORMAL LOW (ref 8.9–10.3)
Calcium: 8.2 mg/dL — ABNORMAL LOW (ref 8.9–10.3)
Chloride: 100 mmol/L (ref 98–111)
Chloride: 102 mmol/L (ref 98–111)
Creatinine, Ser: 1.09 mg/dL — ABNORMAL HIGH (ref 0.44–1.00)
Creatinine, Ser: 1.07 mg/dL — ABNORMAL HIGH (ref 0.44–1.00)
GFR calc Af Amer: >60 mL/min (ref >60)
GFR calc Af Amer: >60 mL/min (ref >60)
GFR calc non Af Amer: 56 mL/min — ABNORMAL LOW (ref >60)
GFR calc non Af Amer: 57 mL/min — ABNORMAL LOW (ref >60)
Glucose, Bld: 126 mg/dL — ABNORMAL HIGH (ref 70–99)
Glucose, Bld: 136 mg/dL — ABNORMAL HIGH (ref 70–99)
Phosphorus: 2.7 mg/dL (ref 2.5–4.6)
Phosphorus: 2.6 mg/dL (ref 2.5–4.6)
Potassium: 4.6 mmol/L (ref 3.5–5.1)
Potassium: 4.4 mmol/L (ref 3.5–5.1)
Sodium: 136 mmol/L (ref 135–145)
Sodium: 135 mmol/L (ref 135–145)

## 2018-09-03 LAB — CULTURE, BLOOD (ROUTINE X 2)
Culture: NO GROWTH
Culture: NO GROWTH
Special Requests: ADEQUATE

## 2018-09-03 LAB — POCT I-STAT 7, (LYTES, BLD GAS, ICA,H+H)
Acid-Base Excess: 2 mmol/L (ref 0.0–2.0)
Bicarbonate: 27.2 mmol/L (ref 20.0–28.0)
Calcium, Ion: 1.21 mmol/L (ref 1.15–1.40)
HEMATOCRIT: 31 % — AB (ref 36.0–46.0)
Hemoglobin: 10.5 g/dL — ABNORMAL LOW (ref 12.0–15.0)
O2 Saturation: 96 %
Patient temperature: 97.3
Potassium: 4.5 mmol/L (ref 3.5–5.1)
Sodium: 135 mmol/L (ref 135–145)
TCO2: 29 mmol/L (ref 22–32)
pCO2 arterial: 44.8 mmHg (ref 32.0–48.0)
pH, Arterial: 7.389 (ref 7.350–7.450)
pO2, Arterial: 78 mmHg — ABNORMAL LOW (ref 83.0–108.0)

## 2018-09-03 LAB — POCT ACTIVATED CLOTTING TIME
Activated Clotting Time: 197 seconds
Activated Clotting Time: 202 seconds
Activated Clotting Time: 219 seconds
Activated Clotting Time: 213 seconds
Activated Clotting Time: 180 seconds

## 2018-09-03 LAB — GLUCOSE, CAPILLARY
Glucose-Capillary: 109 mg/dL — ABNORMAL HIGH (ref 70–99)
Glucose-Capillary: 118 mg/dL — ABNORMAL HIGH (ref 70–99)
Glucose-Capillary: 120 mg/dL — ABNORMAL HIGH (ref 70–99)
Glucose-Capillary: 124 mg/dL — ABNORMAL HIGH (ref 70–99)
Glucose-Capillary: 143 mg/dL — ABNORMAL HIGH (ref 70–99)
Glucose-Capillary: 154 mg/dL — ABNORMAL HIGH (ref 70–99)

## 2018-09-03 LAB — APTT: aPTT: 180 seconds (ref 24–36)

## 2018-09-03 MED ORDER — VITAL HIGH PROTEIN PO LIQD
1000.0000 mL | ORAL | Status: DC
  Administered 2018-09-03 – 2018-09-06 (×4): 1000 mL
  Filled 2018-09-03 (×2): qty 1000

## 2018-09-03 MED ORDER — POLYETHYLENE GLYCOL 3350 17 G PO PACK
17.0000 g | PACK | Freq: Two times a day (BID) | ORAL | Status: DC
  Administered 2018-09-03 – 2018-09-08 (×9): 17 g via ORAL
  Filled 2018-09-03 (×13): qty 1

## 2018-09-03 MED ORDER — SODIUM CHLORIDE 0.9 % IV BOLUS
250.0000 mL | Freq: Once | INTRAVENOUS | Status: AC
  Administered 2018-09-03: 250 mL via INTRAVENOUS

## 2018-09-03 MED ORDER — SENNOSIDES 8.8 MG/5ML PO SYRP
5.0000 mL | ORAL_SOLUTION | Freq: Two times a day (BID) | ORAL | Status: DC
  Administered 2018-09-03 – 2018-09-06 (×7): 5 mL
  Filled 2018-09-03 (×7): qty 5

## 2018-09-03 MED ORDER — JEVITY 1.2 CAL PO LIQD: 1000.0000 mL | ORAL | Status: DC

## 2018-09-03 MED ORDER — METOPROLOL TARTRATE 25 MG/10 ML ORAL SUSPENSION
25.0000 mg | Freq: Two times a day (BID) | ORAL | Status: DC
  Administered 2018-09-03 – 2018-09-06 (×7): 25 mg
  Filled 2018-09-03 (×8): qty 10

## 2018-09-03 MED ORDER — HYDRALAZINE HCL 50 MG PO TABS
50.0000 mg | ORAL_TABLET | Freq: Three times a day (TID) | ORAL | Status: DC
  Administered 2018-09-03 – 2018-09-06 (×9): 50 mg
  Filled 2018-09-03 (×10): qty 1

## 2018-09-03 MED ORDER — PRO-STAT SUGAR FREE PO LIQD
30.0000 mL | Freq: Every day | ORAL | Status: DC
  Administered 2018-09-03 – 2018-09-08 (×6): 30 mL
  Filled 2018-09-03 (×6): qty 30

## 2018-09-03 NOTE — Progress Notes (Signed)
Patient ID: Anna [name], female   DOB: October 27, 1960, 58 y.o.   MRN: [id number] S:continues to have issues with weaning sedation O:BP 100/71   Pulse 77   Temp 97.6 F (36.4 C) (Oral)   Resp 16   Ht '5\' 6"'$  (1.676 m)   Wt 79.8 kg   LMP 07/07/2002 (Exact Date)   SpO2 96%   BMI 28.40 kg/m   Intake/Output Summary (Last 24 hours) at 09/03/2018 1021 Last data filed at 09/03/2018 0700 Gross per 24 hour  Intake 2253.32 ml  Output 3302 ml  Net -1048.68 ml   Intake/Output: I/O last 3 completed shifts: In: 3963.4 [I.V.:1662.3; NG/GT:2160; IV Piggyback:141] Out: 7846 [Urine:545; Emesis/NG output:25; NGEXB:2841]  Intake/Output this shift:  No intake/output data recorded. Weight change: -2.3 kg Gen: intubated and sedated CVS: no rub Resp: cta Abd: +BS, soft, ND Ext: trace edema  Recent Labs  Lab 08/29/18 0747  08/31/18 0243  08/31/18 1227 08/31/18 1722  09/01/18 0333  09/01/18 1614 09/02/18 0409 09/02/18 0507 09/02/18 1237 09/02/18 1606 09/03/18 0347 09/03/18 0427  NA 139   < > 139   < > 139 140   < > 139   < > 138 136 137 134* 135 136 135  K 4.0   < > 5.6*   < > 4.5 3.8   < > 3.5   < > 3.7 4.2 4.2 4.1 4.3 4.6 4.5  CL 112*   < > 113*  --  114* 109  --  98  --  98 99  --   --  99 100  --   CO2 15*   < > 13*  --  15* 22  --  29  --  29 26  --   --  27 27  --   GLUCOSE 120*   < > 139*  --  163* 138*  --  147*  --  146* 146*  --   --  155* 126*  --   BUN 47*   < > 87*  --  94* 75*  --  58*  --  43* 35*  --   --  34* 32*  --   CREATININE 3.13*   < > 3.42*  --  3.19* 2.30*  --  1.70*  --  1.40* 1.11*  --   --  0.99 1.09*  --   ALBUMIN 2.2*  --  1.9*  --   --  1.8*  --  1.7*  --  2.1* 2.2*  --   --  2.3* 2.2*  --   CALCIUM 8.2*   < > 7.3*  --  7.4* 7.3*  --  6.9*  --  7.7* 7.9*  --   --  8.3* 8.4*  --   PHOS  --    < > 8.3*  --   --  5.8*  --  3.4  --  2.6 2.8  --   --  2.6 2.7  --   AST 15  --   --   --   --   --   --   --   --   --   --   --   --   --   --   --   ALT 11  --   --    --   --   --   --   --   --   --   --   --   --   --   --   --    < > =  values in this interval not displayed.   Liver Function Tests: Recent Labs  Lab 08/29/18 0747  09/02/18 0409 09/02/18 1606 09/03/18 0347  AST 15  --   --   --   --   ALT 11  --   --   --   --   ALKPHOS 69  --   --   --   --   BILITOT 0.5  --   --   --   --   PROT 6.0*  --   --   --   --   ALBUMIN 2.2*   < > 2.2* 2.3* 2.2*   < > = values in this interval not displayed.   No results for input(s): LIPASE, AMYLASE in the last 168 hours. No results for input(s): AMMONIA in the last 168 hours. CBC: Recent Labs  Lab 08/29/18 0747  08/30/18 0458  08/31/18 0745  09/01/18 0333  09/02/18 1237 09/03/18 0347 09/03/18 0427  WBC 12.1*  --  9.9  --  7.7  --  8.1  --   --  8.0  --   NEUTROABS 11.1*  --   --   --   --   --   --   --   --   --   --   HGB 7.1*   < > 10.0*   < > 10.8*   < > 10.4*   < > 17.7* 11.2* 10.5*  HCT 21.2*   < > 29.1*   < > 32.0*   < > 30.4*   < > 52.0* 33.9* 31.0*  MCV 112.2*  --  100.7*  --  99.4  --  96.2  --   --  102.7*  --   PLT 213  --  178  --  179  --  179  --   --  183  --    < > = values in this interval not displayed.   Cardiac Enzymes: Recent Labs  Lab 08/29/18 0941  TROPONINI 0.07*   CBG: Recent Labs  Lab 09/02/18 1602 09/02/18 1952 09/03/18 0007 09/03/18 0352 09/03/18 0732  GLUCAP 138* 123* 154* 109* 118*    Iron Studies: No results for input(s): IRON, TIBC, TRANSFERRIN, FERRITIN in the last 72 hours. Studies/Results: Dg Chest Port 1 View  Result Date: 09/03/2018 CLINICAL DATA:  58 year old female with a history respiratory failure EXAM: PORTABLE CHEST 1 VIEW COMPARISON:  09/01/2018, 08/31/2018 CT 06/15/2018 FINDINGS: Cardiomediastinal silhouette unchanged. Endotracheal tube unchanged, terminating 3.5 cm above the carina. Unchanged right IJ sheath. Unchanged gastric tube terminating out of the field of view. Improving aeration at the right lung base. Persisting  interlobular septal thickening. No pneumothorax. Blunting of the left costophrenic angle with partial obscuration of the left hemidiaphragm. Changes of centrilobular and paraseptal emphysema. IMPRESSION: Improving aeration, particularly at the right lung base, with persisting mixed interstitial and airspace disease. Likely trace left pleural effusion. Emphysema. Unchanged endotracheal tube, right IJ sheath, and gastric tube. Electronically Signed   By: Corrie Mckusick D.O.   On: 09/03/2018 09:33   . chlorhexidine gluconate (MEDLINE KIT)  15 mL Mouth Rinse BID  . Chlorhexidine Gluconate Cloth  6 each Topical Daily  . escitalopram  20 mg Per Tube Daily  . folic acid  1 mg Oral Daily  . heparin  5,000 Units Subcutaneous Q8H  . hydrocortisone sod succinate (SOLU-CORTEF) inj  50 mg Intravenous Q6H  . hydroxychloroquine  200 mg Oral Daily  . insulin aspart  0-15 Units Subcutaneous Q4H  . levothyroxine  50 mcg Oral Q0600  . mouth rinse  15 mL Mouth Rinse 10 times per day  . midazolam  2 mg Intravenous Once  . pantoprazole (PROTONIX) IV  40 mg Intravenous QHS  . QUEtiapine  400 mg Oral QHS  . sodium chloride flush  10-40 mL Intracatheter Q12H  . vitamin B-12  1,000 mcg Oral Daily    BMET    Component Value Date/Time   NA 135 09/03/2018 0427   K 4.5 09/03/2018 0427   CL 100 09/03/2018 0347   CO2 27 09/03/2018 0347   GLUCOSE 126 (H) 09/03/2018 0347   BUN 32 (H) 09/03/2018 0347   CREATININE 1.09 (H) 09/03/2018 0347   CREATININE 1.17 (H) 01/10/2013 1234   CALCIUM 8.4 (L) 09/03/2018 0347   GFRNONAA 56 (L) 09/03/2018 0347   GFRAA >60 09/03/2018 0347   CBC    Component Value Date/Time   WBC 8.0 09/03/2018 0347   RBC 3.30 (L) 09/03/2018 0347   HGB 10.5 (L) 09/03/2018 0427   HCT 31.0 (L) 09/03/2018 0427   PLT 183 09/03/2018 0347   MCV 102.7 (H) 09/03/2018 0347   MCH 33.9 09/03/2018 0347   MCHC 33.0 09/03/2018 0347   RDW 19.0 (H) 09/03/2018 0347   LYMPHSABS 0.3 (L) 08/29/2018 0747    MONOABS 0.5 08/29/2018 0747   EOSABS 0.0 08/29/2018 0747   BASOSABS 0.0 08/29/2018 0747    Assessment/Plan: 1. AKI/CKD stage 3 in setting of sepsis related to pneumonia. She was hypotensive overnight and likely ischemic ATN. She had received 5 liters of LR upon admission without UOP.  Complements normal so not related to lupus flare.  Has been on CVVHD since 08/31/18 and UOP initially improved but has dropped off over the past 48 hours. 1. bladder scanwith 566m so she does have some urinary retentions/pfoley. 2. Renal UKoreaconsistent with chronic medical renal disease but no obstruction or masses. 3. Remains oliguric and worsening metabolic acidosis and hyperkalemiaand started CVVHD 08/31/18 4. Continue with prismasate 4K/2.5Ca due to correction of hyperkalemia and met acidosis 5. BP labile over last 24 hours.  Decreased goal uf due to dropping UOP and BP. 6. Continue with CVVHD for now and hopefully can transition to IHD over the weekend.  2. Sepsis due to pneumonia in immunocompromised patient. Not on pressors 3. VDRF- per PCCM 4. Metabolic adisosis - due to #1. improved with CVVHD 5. Pulmonary HTN- per PCCM 6. Volume overload- tolerating UF with CVVHD. Will increase rate uf to 1066mhr 7. AMS- likely due to sepsis and hyoxia 8. Anemia- transfuse prn. Felt to be related to SLE 9. 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 10. SLE- agree with stress dose steroids and holding imuran. 11. Volume management- will decrease rate of UF with CVVHD as her CVP and UOP has dropped as well as BP.    JoDonetta PottsMD [id number] [phone]

## 2018-09-03 NOTE — Progress Notes (Signed)
NAME:  Anna [name], MRN:  [id number], DOB:  April 10, 1961, LOS: 5 ADMISSION DATE:  08/29/2018, CONSULTATION DATE:  2/23 REFERRING MD:  Venora Maples, CHIEF COMPLAINT:  Acute hypoxic respiratory failure and Pneumonia   Brief History   58 year old female patient on Imuran, Plaquenil, and prednisone for history of lupus.  Admitted 2/23 with pneumonia and respiratory failure, intubated 2/23.   History of present illness   58 year old female with history of lupus, on Imuran, Plaquenil, and prednisone at baseline, seen in our office 2/18 for routine pulmonary follow-up, no acute pulmonary symptoms at that point.  Presented to the emergency room on 2/23 with 24-hour history of cough, myalgias, worsening shortness of breath, and altered sensorium.  EMS called, temperature 101.  On arrival found to be tachypneic, hypoxic With room air saturations 85%, and also new marked right-sided infiltrate.  Was placed on noninvasive positive pressure ventilation with improvement in work of breathing however did remain tachypneic and tachycardic.  Pulmonary admitting to intensive care given high risk of clinical decompensation  Past Medical History  Lupus on Imuran and Plaquenil as well as prednisone 5 mg daily.  Lupus primarily affecting joints, no evidence of pulmonary involvement as of yet.  Pulmonary hypertension, tobacco abuse, hypertension, hyperlipidemia, depression, prior CVA, admitted in February 2018 for pneumonia with parapneumonic effusion, also just discharged from hospitalization in January 2024 benzodiazepine overdose  Significant Hospital Events   2/23: Presented the emergency room With 24-hour history of cough, myalgias, fever as high as 101 and altered sensorium chest x-ray demonstrating pulmonary infiltrate on the right, hypoxic and tachypneic requiring BiPAP support critical care admitting  Consults:  Nephrology   Procedures:    Significant Diagnostic Tests:    Micro Data:  Respiratory viral  panel 2/23>>>negative  Flu A and B 2/23>>>Negative Urine strep antigen 2/23>>>Negative  Urine Legionella antigen 2/23>>>POSITIVE Sputum culture 2/23>>> Blood culture 2/23>> Urine cx 2/24 >> negative   Antimicrobials:  Levaquin 2/24>>>  Interim history/subjective:  Some labile blood pressure with hypertension, difficulty with sedation reported overnight currently on propofol, fentanyl  Objective   Blood pressure 100/71, pulse 77, temperature 97.6 F (36.4 C), temperature source Oral, resp. rate 16, height 5\' 6"  (1.676 m), weight 79.8 kg, last menstrual period 07/07/2002, SpO2 96 %. CVP:  [11 mmHg] 11 mmHg  Vent Mode: PRVC FiO2 (%):  [60 %-70 %] 65 % Set Rate:  [16 bmp] 16 bmp Vt Set:  [470 mL] 470 mL PEEP:  [12 cmH20] 12 cmH20 Pressure Support:  [12 cmH20] 12 cmH20 Plateau Pressure:  [22 cmH20-25 cmH20] 24 cmH20   Intake/Output Summary (Last 24 hours) at 09/03/2018 1023 Last data filed at 09/03/2018 0700 Gross per 24 hour  Intake 2253.32 ml  Output 3302 ml  Net -1048.68 ml   Filed Weights   09/01/18 0415 09/02/18 0330 09/03/18 0500  Weight: 84.2 kg 82.1 kg 79.8 kg   Examination: General: Obese ill-appearing woman, mechanically ventilated HENT: Endotracheal tube in good position, pupils equal Lungs: Coarse bilateral breath sounds, some scattered inspiratory crackles, no wheezing Cardiovascular: Regular, 80s, distant, no murmur Abdomen: Soft, nondistended, positive bowel sounds Extremities: Trace pretibial edema Neuro: She will try to open eyes to voice, follows commands with both upper extremities Skin: No rash, no breakdown   Resolved Hospital Problem list     Assessment & Plan:  Acute hypoxic respiratory failure in the setting of pneumonia.  She is immunocompromised on Imuran and prednisone Chest x-ray reviewed by me 2/28, improving bilateral infiltrates, particularly on the  right, some persistent alveolar and interstitial infiltrate, blunting of the left  hemidiaphragm Plan Continue PRVC as ordered Wean PEEP and FiO2 2/28 Imuran on hold She is benefiting from volume removal, remains over 1 L positive for the entire hospitalization.  Appreciate nephrology's assistance with ultrafiltration Continue levofloxacin, plan 8-day course of antibiotics total, plan discontinue 09/05/2018  Severe sepsis secondary to pneumonia -Was volume responsive in the emergency room however did present with systolic pressure in the 00F.  Lactic acid was normal Plan IV fluids KVO Continue stress dose hydrocortisone Antibiotics as below  Hypertension Plan I will restart her home hydralazine, metoprolol.  On 2/28.  Hold off on her home Cozaar and nifedipine given the renal failure  Pulmonary hypertension Plan Acute on chronic, following.  Wean FiO2 as able  Acute metabolic encephalopathy in the setting of sepsis and hypoxia Plan Attempt to wean propofol, fentanyl.  Hypertension and tachycardia have been barriers.  Will try to control blood pressure medically Currently on home Seroquel dosing, Lexapro restarted 2/27 at a lower dose  Acute on chronic renal failure stage III; appears as though baseline serum creatinine around 1.9 Plan Appreciate nephrology management.  Suspect we will transition off CVVHD soon given her recovery and blood pressure.  Agree with volume removal, respiratory status is benefiting Medications renally dosed Follow BMP, urine output.  Hopefully she will achieve a renal recovery  Fluid electrolyte balance/acid-base imbalance: Mild hyperchloremia, non-anion gap metabolic acidosis Plan Following BMP Replace electrolytes as indicated  Macrocytic anemia anemia, 1 month ago hemoglobin 7.9, currently 7.1, she is followed by hematology at [facility], felt secondary to her lupus Plan Follow CBC Transfusion goal hemoglobin greater than 7  Hilar lung mass Plan Planning for an outpatient PET scan, followed by Dr. [name].  May consider repeat CT  chest if we feel that there is any contribution of a postobstructive process contributing to her current pneumonia  Hypothyroidism Plan Synthroid as ordered  Nicotine dependence Plan Smoking cessation efforts  History of lupus Plan Plaquenil ordered, Imuran on hold   Best practice:  Diet: N.p.o. Pain/Anxiety/Delirium protocol (if indicated): yes, fentanyl/ propofol VAP protocol (if indicated): Not applicable DVT prophylaxis: Subcutaneous heparin GI prophylaxis: PPI Glucose control: Sliding scale insulin Mobility: Bedrest Code Status: Full code Family Communication: None present on 2/28 Disposition: ICU  Labs   CBC: Recent Labs  Lab 08/29/18 0747  08/30/18 0458  08/31/18 0745  09/01/18 0333  09/01/18 1510 09/02/18 0507 09/02/18 1237 09/03/18 0347 09/03/18 0427  WBC 12.1*  --  9.9  --  7.7  --  8.1  --   --   --   --  8.0  --   NEUTROABS 11.1*  --   --   --   --   --   --   --   --   --   --   --   --   HGB 7.1*   < > 10.0*   < > 10.8*   < > 10.4*   < > 10.5* 12.9 17.7* 11.2* 10.5*  HCT 21.2*   < > 29.1*   < > 32.0*   < > 30.4*   < > 31.0* 38.0 52.0* 33.9* 31.0*  MCV 112.2*  --  100.7*  --  99.4  --  96.2  --   --   --   --  102.7*  --   PLT 213  --  178  --  179  --  179  --   --   --   --  183  --    < > = values in this interval not displayed.    Basic Metabolic Panel: Recent Labs  Lab 08/30/18 0458  08/31/18 0243  09/01/18 0333  09/01/18 1614 09/02/18 0409 09/02/18 0507 09/02/18 1237 09/02/18 1606 09/03/18 0347 09/03/18 0427  NA 140   < > 139   < > 139   < > 138 136 137 134* 135 136 135  K 5.0   < > 5.6*   < > 3.5   < > 3.7 4.2 4.2 4.1 4.3 4.6 4.5  CL 112*  --  113*   < > 98  --  98 99  --   --  99 100  --   CO2 14*  --  13*   < > 29  --  29 26  --   --  27 27  --   GLUCOSE 122*  --  139*   < > 147*  --  146* 146*  --   --  155* 126*  --   BUN 59*  --  87*   < > 58*  --  43* 35*  --   --  34* 32*  --   CREATININE 3.28*  --  3.42*   < > 1.70*  --   1.40* 1.11*  --   --  0.99 1.09*  --   CALCIUM 7.8*  --  7.3*   < > 6.9*  --  7.7* 7.9*  --   --  8.3* 8.4*  --   MG 1.8  --  2.1  --  2.0  --   --  2.6*  --   --   --  2.5*  --   PHOS 8.0*  --  8.3*   < > 3.4  --  2.6 2.8  --   --  2.6 2.7  --    < > = values in this interval not displayed.   GFR: Estimated Creatinine Clearance: 59.2 mL/min (A) (by C-G formula based on SCr of 1.09 mg/dL (H)). Recent Labs  Lab 08/29/18 0814 08/29/18 0941 08/29/18 0943 08/30/18 0458 08/31/18 0243 08/31/18 0745 09/01/18 0333 09/03/18 0347  PROCALCITON  --  5.92  --  15.47 12.98  --   --   --   WBC  --   --   --  9.9  --  7.7 8.1 8.0  LATICACIDVEN 1.4  --  1.3  --   --   --   --   --     Liver Function Tests: Recent Labs  Lab 08/29/18 0747  09/01/18 0333 09/01/18 1614 09/02/18 0409 09/02/18 1606 09/03/18 0347  AST 15  --   --   --   --   --   --   ALT 11  --   --   --   --   --   --   ALKPHOS 69  --   --   --   --   --   --   BILITOT 0.5  --   --   --   --   --   --   PROT 6.0*  --   --   --   --   --   --   ALBUMIN 2.2*   < > 1.7* 2.1* 2.2* 2.3* 2.2*   < > = values in this interval not displayed.   No results for input(s): LIPASE, AMYLASE in the last 168 hours. No results  for input(s): AMMONIA in the last 168 hours.  ABG    Component Value Date/Time   PHART 7.389 09/03/2018 0427   PCO2ART 44.8 09/03/2018 0427   PO2ART 78.0 (L) 09/03/2018 0427   HCO3 27.2 09/03/2018 0427   TCO2 29 09/03/2018 0427   ACIDBASEDEF 12.0 (H) 08/31/2018 0350   O2SAT 96.0 09/03/2018 0427     Coagulation Profile: Recent Labs  Lab 08/29/18 0747  INR 1.46    Cardiac Enzymes: Recent Labs  Lab 08/29/18 0941  TROPONINI 0.07*    HbA1C: No results found for: HGBA1C  CBG: Recent Labs  Lab 09/02/18 1602 09/02/18 1952 09/03/18 0007 09/03/18 0352 09/03/18 0732  GLUCAP 138* 123* 154* 109* 118*   The patient is critically ill with multiple organ systems failure and requires high complexity  decision making for assessment and support, frequent evaluation and titration of therapies, application of advanced monitoring technologies and extensive interpretation of multiple databases.   Independent critical care time 33 minutes  Baltazar Apo, MD, PhD 09/03/2018, 10:23 AM [facility] [phone] or if no answer [phone]

## 2018-09-03 NOTE — Progress Notes (Addendum)
Nutrition Follow Up  DOCUMENTATION CODES:   Not applicable  INTERVENTION:    Decrease Vital High Protein to goal rate of 50 ml/hr  Add Prostat liquid protein 30 ml daily via tube   TF + Propofol provides 1433 kcal, 120 gm protein, 1003 ml free water daily   NUTRITION DIAGNOSIS:   Inadequate oral intake related to inability to eat as evidenced by NPO status, ongoing  GOAL:   Patient will meet greater than or equal to 90% of their needs, met  MONITOR:   TF tolerance, Vent status, Labs, Skin, Weight trends, I & O's  ASSESSMENT:   58 yo female, admitted with acute respiratory failure and PNA. PMH significant for lupus, acute CHF, HTN, hypothyroid, h/o stroke, recent hospitalization in January for acute encephalopathy and AKI.  Patient is currently intubated on ventilator support MV: 6.0 L/min Temp (24hrs), Avg:97.9 F (36.6 C), Min:97.3 F (36.3 C), Max:98.7 F (37.1 C)  Propofol: 5.05 ml/hr >> 133 fat kcals   Pt receiving CVVHD upon visit today; RN at bedside. Vital High Protein infusing at goal rate of 60 ml/hr via OGT. CCM note reviewed; PNA >> chest xray improving.  Pt's weight stabilizing to baseline with volume removal.  Labs & medications reviewed. Mg 2.5 (H). CBG's [phone].  Diet Order:   Diet Order            Diet NPO time specified Except for: Sips with Meds  Diet effective now             EDUCATION NEEDS:   Not appropriate for education at this time  Skin:  Skin Assessment: Reviewed RN Assessment  Last BM:  PTA   Intake/Output Summary (Last 24 hours) at 09/03/2018 1550 Last data filed at 09/03/2018 1500 Gross per 24 hour  Intake 2693.9 ml  Output 3706 ml  Net -1012.1 ml   Height:  Ht Readings from Last 1 Encounters:  08/29/18 '5\' 6"'$  (1.676 m)   Wt Readings from Last 3 Encounters:  09/03/18 79.8 kg  08/24/18 78.4 kg  07/24/18 80 kg  Admit wt 2/23  78.4 kg  Ideal Body Weight:  59.1 kg  BMI:  Body mass index is 28.4 kg/m.    Estimated Nutritional Needs:   Kcal:  1372  Protein:  120-135 gm  Fluid:  per MD  Arthur Holms, RD, LDN Pager #: [phone] After-Hours Pager #: [phone]

## 2018-09-03 NOTE — Progress Notes (Signed)
hypotensive : BP 65/43: Sedation paused: keeping even on CRRT : Contacted elink for 250 bolus: order pending

## 2018-09-03 NOTE — Progress Notes (Signed)
CRITICAL VALUE ALERT  Critical Value:  PTT 180  Date & Time Notied:  09/03/2018 0450  Provider Notified: Warren Lacy RN notified (Abby), report to MD  Orders Received/Actions taken: No new orders.

## 2018-09-04 ENCOUNTER — Inpatient Hospital Stay (HOSPITAL_COMMUNITY): Payer: BLUE CROSS/BLUE SHIELD

## 2018-09-04 LAB — RENAL FUNCTION PANEL
ANION GAP: 10 (ref 5–15)
ANION GAP: 11 (ref 5–15)
Albumin: 2.4 g/dL — ABNORMAL LOW (ref 3.5–5.0)
Albumin: 1.1 g/dL — ABNORMAL LOW (ref 3.5–5.0)
Albumin: 2.4 g/dL — ABNORMAL LOW (ref 3.5–5.0)
Anion gap: 2 — ABNORMAL LOW (ref 5–15)
BUN: 34 mg/dL — ABNORMAL HIGH (ref 6–20)
BUN: 25 mg/dL — ABNORMAL HIGH (ref 6–20)
BUN: 46 mg/dL — ABNORMAL HIGH (ref 6–20)
CALCIUM: 8.9 mg/dL (ref 8.9–10.3)
CO2: 25 mmol/L (ref 22–32)
CO2: 15 mmol/L — ABNORMAL LOW (ref 22–32)
CO2: 25 mmol/L (ref 22–32)
Calcium: 4.3 mg/dL — CL (ref 8.9–10.3)
Calcium: 9 mg/dL (ref 8.9–10.3)
Chloride: 100 mmol/L (ref 98–111)
Chloride: 126 mmol/L — ABNORMAL HIGH (ref 98–111)
Chloride: 101 mmol/L (ref 98–111)
Creatinine, Ser: 1.1 mg/dL — ABNORMAL HIGH (ref 0.44–1.00)
Creatinine, Ser: 0.62 mg/dL (ref 0.44–1.00)
Creatinine, Ser: 1.47 mg/dL — ABNORMAL HIGH (ref 0.44–1.00)
GFR calc Af Amer: >60 mL/min (ref >60)
GFR calc non Af Amer: 55 mL/min — ABNORMAL LOW (ref >60)
GFR calc non Af Amer: >60 mL/min (ref >60)
GFR calc non Af Amer: 39 mL/min — ABNORMAL LOW (ref >60)
GFR, EST AFRICAN AMERICAN: 45 mL/min — AB (ref >60)
GLUCOSE: 81 mg/dL (ref 70–99)
Glucose, Bld: 162 mg/dL — ABNORMAL HIGH (ref 70–99)
Glucose, Bld: 174 mg/dL — ABNORMAL HIGH (ref 70–99)
Phosphorus: 2.2 mg/dL — ABNORMAL LOW (ref 2.5–4.6)
Phosphorus: 1.3 mg/dL — ABNORMAL LOW (ref 2.5–4.6)
Phosphorus: 2.8 mg/dL (ref 2.5–4.6)
Potassium: 4.9 mmol/L (ref 3.5–5.1)
Potassium: 2.3 mmol/L — CL (ref 3.5–5.1)
Potassium: 5 mmol/L (ref 3.5–5.1)
Sodium: 135 mmol/L (ref 135–145)
Sodium: 143 mmol/L (ref 135–145)
Sodium: 137 mmol/L (ref 135–145)

## 2018-09-04 LAB — GLUCOSE, CAPILLARY
GLUCOSE-CAPILLARY: 147 mg/dL — AB (ref 70–99)
Glucose-Capillary: 115 mg/dL — ABNORMAL HIGH (ref 70–99)
Glucose-Capillary: 143 mg/dL — ABNORMAL HIGH (ref 70–99)
Glucose-Capillary: 95 mg/dL (ref 70–99)
Glucose-Capillary: 31 mg/dL — CL (ref 70–99)
Glucose-Capillary: 187 mg/dL — ABNORMAL HIGH (ref 70–99)
Glucose-Capillary: 149 mg/dL — ABNORMAL HIGH (ref 70–99)
Glucose-Capillary: 128 mg/dL — ABNORMAL HIGH (ref 70–99)

## 2018-09-04 LAB — TRIGLYCERIDES
TRIGLYCERIDES: 63 mg/dL (ref <150)
Triglycerides: 25 mg/dL (ref <150)

## 2018-09-04 LAB — POCT ACTIVATED CLOTTING TIME
ACTIVATED CLOTTING TIME: 191 s
ACTIVATED CLOTTING TIME: 208 s
Activated Clotting Time: 180 seconds
Activated Clotting Time: 186 seconds
Activated Clotting Time: 186 seconds
Activated Clotting Time: 202 seconds
Activated Clotting Time: 202 seconds
Activated Clotting Time: 202 seconds
Activated Clotting Time: 208 seconds

## 2018-09-04 LAB — CBC
HCT: 35.9 % — ABNORMAL LOW (ref 36.0–46.0)
Hemoglobin: 11.5 g/dL — ABNORMAL LOW (ref 12.0–15.0)
MCH: 33 pg (ref 26.0–34.0)
MCHC: 32 g/dL (ref 30.0–36.0)
MCV: 103.2 fL — ABNORMAL HIGH (ref 80.0–100.0)
Platelets: 237 10*3/uL (ref 150–400)
RBC: 3.48 MIL/uL — ABNORMAL LOW (ref 3.87–5.11)
RDW: 19.2 % — ABNORMAL HIGH (ref 11.5–15.5)
WBC: 11.1 10*3/uL — AB (ref 4.0–10.5)
nRBC: 0 % (ref 0.0–0.2)

## 2018-09-04 LAB — MAGNESIUM: Magnesium: 2.4 mg/dL (ref 1.7–2.4)

## 2018-09-04 LAB — APTT: APTT: 134 s — AB (ref 24–36)

## 2018-09-04 MED ORDER — DEXTROSE 50 % IV SOLN
25.0000 g | Freq: Once | INTRAVENOUS | Status: AC
  Administered 2018-09-04: 25 g via INTRAVENOUS

## 2018-09-04 MED ORDER — SODIUM CHLORIDE 0.9 % IV BOLUS
250.0000 mL | Freq: Once | INTRAVENOUS | Status: AC
  Administered 2018-09-04: 250 mL via INTRAVENOUS

## 2018-09-04 MED ORDER — DEXTROSE 50 % IV SOLN
INTRAVENOUS | Status: AC
  Filled 2018-09-04: qty 50

## 2018-09-04 NOTE — Progress Notes (Signed)
persistent hypotension: initially responded to 250cc bolus: discussed with Dr Deterding: repeat 250cc bolus

## 2018-09-04 NOTE — Progress Notes (Signed)
Hypoglycemic Event  CBG: 36 on 1st test; 31 on repeat test   Treatment: Administered 25g Dextrose 50% intravenously; recheck CBG in 20 minutes.  Symptoms: Patient sedated and intubated; difficult to assess.  Can follow commands.  Diaphoretic appearance.  Follow-up CBG: Time: 1615 CBG Result:  Possible Reasons for Event: Patient critically ill.  Patient has been NPO for several days.  Tube feed formula changed yesterday.     Jeralene Peters M

## 2018-09-04 NOTE — Progress Notes (Signed)
Patient ID: Anna [name], female   DOB: 1960/07/12, 58 y.o.   MRN: [id number] S: BP was labile overnight.  Received a 500 cc bolus for drop in BP but elevated this am. O:BP (!) 147/85   Pulse 75   Temp 98.6 F (37 C) (Oral)   Resp 17   Ht 5' 6" (1.676 m)   Wt 79.8 kg   LMP 07/07/2002 (Exact Date)   SpO2 99%   BMI 28.40 kg/m   Intake/Output Summary (Last 24 hours) at 09/04/2018 0944 Last data filed at 09/04/2018 0900 Gross per 24 hour  Intake 2305.23 ml  Output 3967 ml  Net -1661.77 ml   Intake/Output: I/O last 3 completed shifts: In: 8841 [I.V.:1485.6; NG/GT:2063.3; IV Piggyback:100.1] Out: 5739 [Urine:335; Emesis/NG output:40; YSAYT:0160]  Intake/Output this shift:  Total I/O In: 205.7 [I.V.:75.7; NG/GT:130] Out: 403 [Urine:45; Other:358] Weight change: 0 kg Gen: intubated and sedated CVS: no rub Resp: occ rhonchi Abd: +BS, soft, NT/ND Ext: no edema  Recent Labs  Lab 08/29/18 0747  09/01/18 0333  09/01/18 1614 09/02/18 0409 09/02/18 0507 09/02/18 1237 09/02/18 1606 09/03/18 0347 09/03/18 0427 09/03/18 1524 09/04/18 0430  NA 139   < > 139   < > 138 136 137 134* 135 136 135 135 135  K 4.0   < > 3.5   < > 3.7 4.2 4.2 4.1 4.3 4.6 4.5 4.4 4.9  CL 112*   < > 98  --  98 99  --   --  99 100  --  102 100  CO2 15*   < > 29  --  29 26  --   --  27 27  --  26 25  GLUCOSE 120*   < > 147*  --  146* 146*  --   --  155* 126*  --  136* 162*  BUN 47*   < > 58*  --  43* 35*  --   --  34* 32*  --  37* 34*  CREATININE 3.13*   < > 1.70*  --  1.40* 1.11*  --   --  0.99 1.09*  --  1.07* 1.10*  ALBUMIN 2.2*   < > 1.7*  --  2.1* 2.2*  --   --  2.3* 2.2*  --  2.1* 2.4*  CALCIUM 8.2*   < > 6.9*  --  7.7* 7.9*  --   --  8.3* 8.4*  --  8.2* 8.9  PHOS  --    < > 3.4  --  2.6 2.8  --   --  2.6 2.7  --  2.6 2.2*  AST 15  --   --   --   --   --   --   --   --   --   --   --   --   ALT 11  --   --   --   --   --   --   --   --   --   --   --   --    < > = values in this interval not  displayed.   Liver Function Tests: Recent Labs  Lab 08/29/18 0747  09/03/18 0347 09/03/18 1524 09/04/18 0430  AST 15  --   --   --   --   ALT 11  --   --   --   --   ALKPHOS 69  --   --   --   --  BILITOT 0.5  --   --   --   --   PROT 6.0*  --   --   --   --   ALBUMIN 2.2*   < > 2.2* 2.1* 2.4*   < > = values in this interval not displayed.   No results for input(s): LIPASE, AMYLASE in the last 168 hours. No results for input(s): AMMONIA in the last 168 hours. CBC: Recent Labs  Lab 08/29/18 0747  08/30/18 0458  08/31/18 0745  09/01/18 0333  09/03/18 0347 09/03/18 0427 09/04/18 0430  WBC 12.1*  --  9.9  --  7.7  --  8.1  --  8.0  --  11.1*  NEUTROABS 11.1*  --   --   --   --   --   --   --   --   --   --   HGB 7.1*   < > 10.0*   < > 10.8*   < > 10.4*   < > 11.2* 10.5* 11.5*  HCT 21.2*   < > 29.1*   < > 32.0*   < > 30.4*   < > 33.9* 31.0* 35.9*  MCV 112.2*  --  100.7*  --  99.4  --  96.2  --  102.7*  --  103.2*  PLT 213  --  178  --  179  --  179  --  183  --  237   < > = values in this interval not displayed.   Cardiac Enzymes: Recent Labs  Lab 08/29/18 0941  TROPONINI 0.07*   CBG: Recent Labs  Lab 09/03/18 1519 09/03/18 1936 09/03/18 2324 09/04/18 0343 09/04/18 0739  GLUCAP 120* 124* 115* 147* 143*    Iron Studies: No results for input(s): IRON, TIBC, TRANSFERRIN, FERRITIN in the last 72 hours. Studies/Results: Dg Chest Port 1 View  Result Date: 09/04/2018 CLINICAL DATA:  Acute respiratory failure with hypoxia. Follow-up exam. EXAM: PORTABLE CHEST 1 VIEW COMPARISON:  09/03/2018 and older exams. FINDINGS: Continued improvement. Decreased opacity at the left lung base with the left hemidiaphragm not clearly visible. Persistent interstitial type opacities are noted most evident in the right mid lung. No new lung abnormalities. Endotracheal tube, nasogastric tube and dual lumen right internal jugular central venous line are stable. No pneumothorax. IMPRESSION: 1.  Continued improvement with improved aeration most evident at the left lung base. No new abnormalities. 2. Support apparatus is stable. Electronically Signed   By: Lajean Manes M.D.   On: 09/04/2018 08:57   Dg Chest Port 1 View  Result Date: 09/03/2018 CLINICAL DATA:  58 year old female with a history respiratory failure EXAM: PORTABLE CHEST 1 VIEW COMPARISON:  09/01/2018, 08/31/2018 CT 06/15/2018 FINDINGS: Cardiomediastinal silhouette unchanged. Endotracheal tube unchanged, terminating 3.5 cm above the carina. Unchanged right IJ sheath. Unchanged gastric tube terminating out of the field of view. Improving aeration at the right lung base. Persisting interlobular septal thickening. No pneumothorax. Blunting of the left costophrenic angle with partial obscuration of the left hemidiaphragm. Changes of centrilobular and paraseptal emphysema. IMPRESSION: Improving aeration, particularly at the right lung base, with persisting mixed interstitial and airspace disease. Likely trace left pleural effusion. Emphysema. Unchanged endotracheal tube, right IJ sheath, and gastric tube. Electronically Signed   By: Corrie Mckusick D.O.   On: 09/03/2018 09:33   . chlorhexidine gluconate (MEDLINE KIT)  15 mL Mouth Rinse BID  . Chlorhexidine Gluconate Cloth  6 each Topical Daily  . escitalopram  20 mg Per Tube  Daily  . feeding supplement (PRO-STAT SUGAR FREE 64)  30 mL Per Tube Daily  . folic acid  1 mg Oral Daily  . heparin  5,000 Units Subcutaneous Q8H  . hydrALAZINE  50 mg Per Tube Q8H  . hydrocortisone sod succinate (SOLU-CORTEF) inj  50 mg Intravenous Q6H  . hydroxychloroquine  200 mg Oral Daily  . insulin aspart  0-15 Units Subcutaneous Q4H  . levothyroxine  50 mcg Oral Q0600  . mouth rinse  15 mL Mouth Rinse 10 times per day  . metoprolol tartrate  25 mg Per Tube BID  . midazolam  2 mg Intravenous Once  . pantoprazole (PROTONIX) IV  40 mg Intravenous QHS  . polyethylene glycol  17 g Oral BID  . QUEtiapine   400 mg Oral QHS  . sennosides  5 mL Per Tube BID  . sodium chloride flush  10-40 mL Intracatheter Q12H  . vitamin B-12  1,000 mcg Oral Daily    BMET    Component Value Date/Time   NA 135 09/04/2018 0430   K 4.9 09/04/2018 0430   CL 100 09/04/2018 0430   CO2 25 09/04/2018 0430   GLUCOSE 162 (H) 09/04/2018 0430   BUN 34 (H) 09/04/2018 0430   CREATININE 1.10 (H) 09/04/2018 0430   CREATININE 1.17 (H) 01/10/2013 1234   CALCIUM 8.9 09/04/2018 0430   GFRNONAA 55 (L) 09/04/2018 0430   GFRAA >60 09/04/2018 0430   CBC    Component Value Date/Time   WBC 11.1 (H) 09/04/2018 0430   RBC 3.48 (L) 09/04/2018 0430   HGB 11.5 (L) 09/04/2018 0430   HCT 35.9 (L) 09/04/2018 0430   PLT 237 09/04/2018 0430   MCV 103.2 (H) 09/04/2018 0430   MCH 33.0 09/04/2018 0430   MCHC 32.0 09/04/2018 0430   RDW 19.2 (H) 09/04/2018 0430   LYMPHSABS 0.3 (L) 08/29/2018 0747   MONOABS 0.5 08/29/2018 0747   EOSABS 0.0 08/29/2018 0747   BASOSABS 0.0 08/29/2018 0747    Assessment/Plan: 1. AKI/CKD stage 3 in setting of sepsis related to pneumonia. She was hypotensive overnight and likely ischemic ATN. She had received 5 liters of LR upon admission without UOP.  Complements normal so not related to lupus flare.  Has been on CVVHD since 08/31/18 and UOP initially improved but has dropped off over the past 48 hours. 1. bladder scanwith 522m so she does have some urinary retentions/pfoley. 2. Renal UKoreaconsistent with chronic medical renal disease but no obstruction or masses. 3. Remains oliguric and worsening metabolic acidosis and hyperkalemiaand started CVVHD 08/31/18 4. Given improved hemodynamics, will plan to stop CVVHD today and transition to IHD on Monday if her renal function does not improve.  2. Sepsis due to pneumonia in immunocompromised patient. Not on pressors 3. VDRF- per PCCM 4. Metabolic adisosis - due to #1. improved with CVVHD 5. Pulmonary HTN- per PCCM 6. Volume overload-improved with  CVVHDF but given drops in bp will stop cvvhd and follow I's/O's.  7. AMS- likely due to sepsis and hyoxia 8. Anemia- transfuse prn. Felt to be related to SLE 9. 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 10. SLE- agree with stress dose steroids and holding imuran.   JDonetta Potts MD [employer]([phone]

## 2018-09-04 NOTE — Progress Notes (Signed)
NAME:  Anna [name], MRN:  [id number], DOB:  [birth year], LOS: 6 ADMISSION DATE:  08/29/2018, CONSULTATION DATE:  2/23 REFERRING MD:  Venora Maples, CHIEF COMPLAINT:  Acute hypoxic respiratory failure and Pneumonia   Brief History   58 year old female patient on Imuran, Plaquenil, and prednisone for history of lupus.  Admitted 2/23 with pneumonia and respiratory failure, intubated 2/23.   History of present illness   58 year old female with history of lupus, on Imuran, Plaquenil, and prednisone at baseline, seen in our office 2/18 for routine pulmonary follow-up, no acute pulmonary symptoms at that point.  Presented to the emergency room on 2/23 with 24-hour history of cough, myalgias, worsening shortness of breath, and altered sensorium.  EMS called, temperature 101.  On arrival found to be tachypneic, hypoxic With room air saturations 85%, and also new marked right-sided infiltrate.  Was placed on noninvasive positive pressure ventilation with improvement in work of breathing however did remain tachypneic and tachycardic.  Pulmonary admitting to intensive care given high risk of clinical decompensation  Past Medical History  Lupus on Imuran and Plaquenil as well as prednisone 5 mg daily.  Lupus primarily affecting joints, no evidence of pulmonary involvement as of yet.  Pulmonary hypertension, tobacco abuse, hypertension, hyperlipidemia, depression, prior CVA, admitted in February 2018 for pneumonia with parapneumonic effusion, also just discharged from hospitalization in January 2024 benzodiazepine overdose  Significant Hospital Events   2/23: Presented the emergency room With 24-hour history of cough, myalgias, fever as high as 101 and altered sensorium chest x-ray demonstrating pulmonary infiltrate on the right, hypoxic and tachypneic requiring BiPAP support critical care admitting  Consults:  Nephrology   Procedures:    Significant Diagnostic Tests:    Micro Data:  Respiratory viral  panel 2/23>>>negative  Flu A and B 2/23>>>Negative Urine strep antigen 2/23>>>Negative  Urine Legionella antigen 2/23>>>POSITIVE Sputum culture 2/23>>> Blood culture 2/23>> Urine cx 2/24 >> negative   Antimicrobials:  Levaquin 2/24>>>  Interim history/subjective:  Blood pressure up-and-down last 24 hours, did have an episode of hypotension overnight, received 500 cc IV fluid bolus Currently fentanyl 200, propofol 10 Chest x-ray reviewed by me 2/29 improving especially on the left  Objective   Blood pressure (!) 147/85, pulse 75, temperature 98.6 F (37 C), temperature source Oral, resp. rate 17, height 5\' 6"  (1.676 m), weight 79.8 kg, last menstrual period 07/07/2002, SpO2 99 %. CVP:  [10 mmHg] 10 mmHg  Vent Mode: PRVC FiO2 (%):  [40 %-50 %] 40 % Set Rate:  [16 bmp] 16 bmp Vt Set:  [470 mL] 470 mL PEEP:  [8 cmH20-12 cmH20] 8 cmH20 Plateau Pressure:  [18 cmH20-25 cmH20] 18 cmH20   Intake/Output Summary (Last 24 hours) at 09/04/2018 0955 Last data filed at 09/04/2018 0900 Gross per 24 hour  Intake 2305.23 ml  Output 3967 ml  Net -1661.77 ml   Filed Weights   09/02/18 0330 09/03/18 0500 09/04/18 0500  Weight: 82.1 kg 79.8 kg 79.8 kg   Examination: General: Obese, ill-appearing woman, mechanically ventilated HENT: ET tube in place, no oral lesions Lungs: Distant, somewhat coarse, no wheeze, few scattered crackles Cardiovascular: Regular, 80s, distant, no murmur Abdomen: Soft, nondistended with positive bowel sounds Extremities: Trace edema right lower extremity, none on the left Neuro: Quite sedated, tries to open eyes to voice, intermittently follows commands Skin: No breakdown, no rash   Resolved Hospital Problem list     Assessment & Plan:  Acute hypoxic respiratory failure in the setting of pneumonia.  She is  immunocompromised on Imuran and prednisone Chest x-ray reviewed by me 2/ 29, bilateral infiltrates continue to improve especially on the left Plan Continue  PRVC Decrease PEEP to 8, FiO2 to 40% Hopefully will be able to move towards spontaneous breathing soon as volume is removed Imuran on hold Continue levofloxacin, planned stop date 09/05/2018  Severe sepsis secondary to pneumonia, improved -Was volume responsive in the emergency room however did present with systolic pressure in the 57D.  Lactic acid was normal Plan Continue stress dose hydrocortisone Antibiotics through 09/05/2018  Hypertension Plan Restarted hydralazine, metoprolol on 2/28.  She has had some episodes of hypotension but overall appears to be tolerating.  Home Cozaar and nifedipine are on hold  Pulmonary hypertension Plan Acute on chronic, following.  Wean FiO2 as able  Acute metabolic encephalopathy in the setting of sepsis and hypoxia Plan Continue to try to decrease sedating medications Continue Seroquel, Lexapro as ordered  Acute on chronic renal failure stage III; appears as though baseline serum creatinine around 1.9 Plan Note plans to transition off CVVHD on 2/29.  Hopefully urine output will increase, show evidence for renal recovery Medications renally dose Follow BMP  Fluid electrolyte balance/acid-base imbalance: Mild hyperchloremia, non-anion gap metabolic acidosis Plan Follow BMP Replace electrolytes as indicated  Macrocytic anemia anemia, 1 month ago hemoglobin 7.9, currently 7.1, she is followed by hematology at [facility], felt secondary to her lupus Plan Follow CBC Transfusion goal, hemoglobin greater than 7  Hilar lung mass Plan Planning for an outpatient PET scan, followed by Dr. [name].  May consider repeat CT chest if we feel that there is any contribution of a postobstructive process contributing to her current pneumonia  Hypothyroidism Plan Synthroid as ordered  Nicotine dependence Plan Smoking cessation efforts  History of lupus Plan Imuran on hold, Plaquenil ordered   Best practice:  Diet: N.p.o. Pain/Anxiety/Delirium  protocol (if indicated): yes, fentanyl/ propofol VAP protocol (if indicated): Not applicable DVT prophylaxis: Subcutaneous heparin GI prophylaxis: PPI Glucose control: Sliding scale insulin Mobility: Bedrest Code Status: Full code Family Communication: None present on 2/29 Disposition: ICU  Labs   CBC: Recent Labs  Lab 08/29/18 0747  08/30/18 0458  08/31/18 0745  09/01/18 0333  09/02/18 0507 09/02/18 1237 09/03/18 0347 09/03/18 0427 09/04/18 0430  WBC 12.1*  --  9.9  --  7.7  --  8.1  --   --   --  8.0  --  11.1*  NEUTROABS 11.1*  --   --   --   --   --   --   --   --   --   --   --   --   HGB 7.1*   < > 10.0*   < > 10.8*   < > 10.4*   < > 12.9 17.7* 11.2* 10.5* 11.5*  HCT 21.2*   < > 29.1*   < > 32.0*   < > 30.4*   < > 38.0 52.0* 33.9* 31.0* 35.9*  MCV 112.2*  --  100.7*  --  99.4  --  96.2  --   --   --  102.7*  --  103.2*  PLT 213  --  178  --  179  --  179  --   --   --  183  --  237   < > = values in this interval not displayed.    Basic Metabolic Panel: Recent Labs  Lab 08/31/18 0243  09/01/18 0333  09/02/18 0409  09/02/18 1606  09/03/18 0347 09/03/18 0427 09/03/18 1524 09/04/18 0430  NA 139   < > 139   < > 136   < > 135 136 135 135 135  K 5.6*   < > 3.5   < > 4.2   < > 4.3 4.6 4.5 4.4 4.9  CL 113*   < > 98   < > 99  --  99 100  --  102 100  CO2 13*   < > 29   < > 26  --  27 27  --  26 25  GLUCOSE 139*   < > 147*   < > 146*  --  155* 126*  --  136* 162*  BUN 87*   < > 58*   < > 35*  --  34* 32*  --  37* 34*  CREATININE 3.42*   < > 1.70*   < > 1.11*  --  0.99 1.09*  --  1.07* 1.10*  CALCIUM 7.3*   < > 6.9*   < > 7.9*  --  8.3* 8.4*  --  8.2* 8.9  MG 2.1  --  2.0  --  2.6*  --   --  2.5*  --   --  2.4  PHOS 8.3*   < > 3.4   < > 2.8  --  2.6 2.7  --  2.6 2.2*   < > = values in this interval not displayed.   GFR: Estimated Creatinine Clearance: 58.7 mL/min (A) (by C-G formula based on SCr of 1.1 mg/dL (H)). Recent Labs  Lab 08/29/18 0814 08/29/18 0941  08/29/18 0943 08/30/18 0458 08/31/18 0243 08/31/18 0745 09/01/18 0333 09/03/18 0347 09/04/18 0430  PROCALCITON  --  5.92  --  15.47 12.98  --   --   --   --   WBC  --   --   --  9.9  --  7.7 8.1 8.0 11.1*  LATICACIDVEN 1.4  --  1.3  --   --   --   --   --   --     Liver Function Tests: Recent Labs  Lab 08/29/18 0747  09/02/18 0409 09/02/18 1606 09/03/18 0347 09/03/18 1524 09/04/18 0430  AST 15  --   --   --   --   --   --   ALT 11  --   --   --   --   --   --   ALKPHOS 69  --   --   --   --   --   --   BILITOT 0.5  --   --   --   --   --   --   PROT 6.0*  --   --   --   --   --   --   ALBUMIN 2.2*   < > 2.2* 2.3* 2.2* 2.1* 2.4*   < > = values in this interval not displayed.   No results for input(s): LIPASE, AMYLASE in the last 168 hours. No results for input(s): AMMONIA in the last 168 hours.  ABG    Component Value Date/Time   PHART 7.389 09/03/2018 0427   PCO2ART 44.8 09/03/2018 0427   PO2ART 78.0 (L) 09/03/2018 0427   HCO3 27.2 09/03/2018 0427   TCO2 29 09/03/2018 0427   ACIDBASEDEF 12.0 (H) 08/31/2018 0350   O2SAT 96.0 09/03/2018 0427     Coagulation Profile: Recent Labs  Lab 08/29/18 0747  INR 1.46  Cardiac Enzymes: Recent Labs  Lab 08/29/18 0941  TROPONINI 0.07*    HbA1C: No results found for: HGBA1C  CBG: Recent Labs  Lab 09/03/18 1519 09/03/18 1936 09/03/18 2324 09/04/18 0343 09/04/18 0739  GLUCAP 120* 124* 115* 147* 143*   The patient is critically ill with multiple organ systems failure and requires high complexity decision making for assessment and support, frequent evaluation and titration of therapies, application of advanced monitoring technologies and extensive interpretation of multiple databases.   Independent critical care time 32 minutes  Baltazar Apo, MD, PhD 09/04/2018, 9:55 AM [facility] [phone] or if no answer [phone]

## 2018-09-05 ENCOUNTER — Inpatient Hospital Stay (HOSPITAL_COMMUNITY): Payer: BLUE CROSS/BLUE SHIELD

## 2018-09-05 LAB — CBC
HCT: 36.9 % (ref 36.0–46.0)
Hemoglobin: 12.1 g/dL (ref 12.0–15.0)
MCH: 33.5 pg (ref 26.0–34.0)
MCHC: 32.8 g/dL (ref 30.0–36.0)
MCV: 102.2 fL — ABNORMAL HIGH (ref 80.0–100.0)
NRBC: 0 % (ref 0.0–0.2)
Platelets: 197 10*3/uL (ref 150–400)
RBC: 3.61 MIL/uL — ABNORMAL LOW (ref 3.87–5.11)
RDW: 18.4 % — ABNORMAL HIGH (ref 11.5–15.5)
WBC: 10.4 10*3/uL (ref 4.0–10.5)

## 2018-09-05 LAB — RENAL FUNCTION PANEL
ANION GAP: 9 (ref 5–15)
Albumin: 2.5 g/dL — ABNORMAL LOW (ref 3.5–5.0)
Albumin: 2.2 g/dL — ABNORMAL LOW (ref 3.5–5.0)
Anion gap: 13 (ref 5–15)
BUN: 57 mg/dL — ABNORMAL HIGH (ref 6–20)
BUN: 75 mg/dL — ABNORMAL HIGH (ref 6–20)
CO2: 25 mmol/L (ref 22–32)
CO2: 22 mmol/L (ref 22–32)
CREATININE: 1.94 mg/dL — AB (ref 0.44–1.00)
Calcium: 9.4 mg/dL (ref 8.9–10.3)
Calcium: 9.1 mg/dL (ref 8.9–10.3)
Chloride: 103 mmol/L (ref 98–111)
Chloride: 102 mmol/L (ref 98–111)
Creatinine, Ser: 1.68 mg/dL — ABNORMAL HIGH (ref 0.44–1.00)
GFR calc Af Amer: 38 mL/min — ABNORMAL LOW (ref >60)
GFR calc Af Amer: 32 mL/min — ABNORMAL LOW (ref >60)
GFR calc non Af Amer: 33 mL/min — ABNORMAL LOW (ref >60)
GFR calc non Af Amer: 28 mL/min — ABNORMAL LOW (ref >60)
Glucose, Bld: 135 mg/dL — ABNORMAL HIGH (ref 70–99)
Glucose, Bld: 153 mg/dL — ABNORMAL HIGH (ref 70–99)
POTASSIUM: 5.1 mmol/L (ref 3.5–5.1)
Phosphorus: 4 mg/dL (ref 2.5–4.6)
Phosphorus: 6 mg/dL — ABNORMAL HIGH (ref 2.5–4.6)
Potassium: 5 mmol/L (ref 3.5–5.1)
Sodium: 137 mmol/L (ref 135–145)
Sodium: 137 mmol/L (ref 135–145)

## 2018-09-05 LAB — MAGNESIUM: MAGNESIUM: 2.3 mg/dL (ref 1.7–2.4)

## 2018-09-05 LAB — GLUCOSE, CAPILLARY
Glucose-Capillary: 132 mg/dL — ABNORMAL HIGH (ref 70–99)
Glucose-Capillary: 139 mg/dL — ABNORMAL HIGH (ref 70–99)
Glucose-Capillary: 141 mg/dL — ABNORMAL HIGH (ref 70–99)
Glucose-Capillary: 132 mg/dL — ABNORMAL HIGH (ref 70–99)

## 2018-09-05 LAB — APTT: aPTT: 41 seconds — ABNORMAL HIGH (ref 24–36)

## 2018-09-05 MED ORDER — CLONIDINE HCL 0.1 MG PO TABS: 0.1000 mg | ORAL_TABLET | Freq: Two times a day (BID) | ORAL | Status: DC

## 2018-09-05 MED ORDER — DEXMEDETOMIDINE HCL IN NACL 400 MCG/100ML IV SOLN
0.0000 ug/kg/h | INTRAVENOUS | Status: DC
  Administered 2018-09-05: 1.4 ug/kg/h via INTRAVENOUS
  Administered 2018-09-05: 0.2 ug/kg/h via INTRAVENOUS
  Administered 2018-09-05: 1.7 ug/kg/h via INTRAVENOUS
  Administered 2018-09-06: 1 ug/kg/h via INTRAVENOUS
  Administered 2018-09-06 (×2): 1.8 ug/kg/h via INTRAVENOUS
  Filled 2018-09-05 (×6): qty 100

## 2018-09-05 MED ORDER — CLONIDINE HCL 0.1 MG PO TABS
0.1000 mg | ORAL_TABLET | Freq: Two times a day (BID) | ORAL | Status: DC
  Administered 2018-09-05 – 2018-09-11 (×13): 0.1 mg
  Filled 2018-09-05 (×13): qty 1

## 2018-09-05 MED ORDER — FUROSEMIDE 10 MG/ML IJ SOLN
80.0000 mg | Freq: Once | INTRAMUSCULAR | Status: AC
  Administered 2018-09-05: 80 mg via INTRAVENOUS
  Filled 2018-09-05: qty 8

## 2018-09-05 MED ORDER — DEXMEDETOMIDINE HCL IN NACL 200 MCG/50ML IV SOLN
0.0000 ug/kg/h | INTRAVENOUS | Status: DC
  Administered 2018-09-05 (×2): 1 ug/kg/h via INTRAVENOUS
  Filled 2018-09-05 (×2): qty 50

## 2018-09-05 NOTE — Progress Notes (Signed)
RT called by RN. Patient became agitated. Bite block out and tube out to 21cm. RT advanced tube back to 24cm at lip. Patient tolerated well. Tidal volumes normal and vitals stable.

## 2018-09-05 NOTE — Progress Notes (Signed)
Patient ID: Anna [name], female   DOB: 06/03/1961, 58 y.o.   MRN: [id number] S: Sitting bolt upright in bed and trying to free her hands from restraints.  RN at bedside adjusting sedation.  Has had labile HTN/hypotension through the night, mainly related to sedation level. O:BP (!) 163/96   Pulse 99   Temp 98.4 F (36.9 C) (Oral)   Resp 18   Ht _0  (1.676 m)   Wt 81.1 kg   LMP 07/07/2002 (Exact Date)   SpO2 99%   BMI 28.86 kg/m   Intake/Output Summary (Last 24 hours) at 09/05/2018 0717 Last data filed at 09/05/2018 0300 Gross per 24 hour  Intake 1805.66 ml  Output 1404 ml  Net 401.66 ml   Intake/Output: I/O last 3 completed shifts: In: 2834.3 [I.V.:1204.2; NG/GT:1530; IV Piggyback:100] Out: 3474 [Urine:745; Other:2660]  Intake/Output this shift:  No intake/output data recorded. Weight change: 1.3 kg Gen: intubated and agitated CVS: tachy at 120's Resp: occ rhonchi Abd: +BS, distended, NT Ext: no edema  Recent Labs  Lab 08/29/18 0747  09/02/18 1606 09/03/18 0347 09/03/18 0427 09/03/18 1524 09/04/18 0430 09/04/18 1542 09/04/18 1729 09/05/18 0250  NA 139   < > 135 136 135 135 135 143 137 137  K 4.0   < > 4.3 4.6 4.5 4.4 4.9 2.3* 5.0 5.1  CL 112*   < > 99 100  --  102 100 126* 101 103  CO2 15*   < > 27 27  --  26 25 15* 25 25  GLUCOSE 120*   < > 155* 126*  --  136* 162* 81 174* 135*  BUN 47*   < > 34* 32*  --  37* 34* 25* 46* 57*  CREATININE 3.13*   < > 0.99 1.09*  --  1.07* 1.10* 0.62 1.47* 1.68*  ALBUMIN 2.2*   < > 2.3* 2.2*  --  2.1* 2.4* 1.1* 2.4* 2.5*  CALCIUM 8.2*   < > 8.3* 8.4*  --  8.2* 8.9 4.3* 9.0 9.4  PHOS  --    < > 2.6 2.7  --  2.6 2.2* 1.3* 2.8 4.0  AST 15  --   --   --   --   --   --   --   --   --   ALT 11  --   --   --   --   --   --   --   --   --    < > = values in this interval not displayed.   Liver Function Tests: Recent Labs  Lab 08/29/18 0747  09/04/18 1542 09/04/18 1729 09/05/18 0250  AST 15  --   --   --   --   ALT 11  --   --    --   --   ALKPHOS 69  --   --   --   --   BILITOT 0.5  --   --   --   --   PROT 6.0*  --   --   --   --   ALBUMIN 2.2*   < > 1.1* 2.4* 2.5*   < > = values in this interval not displayed.   No results for input(s): LIPASE, AMYLASE in the last 168 hours. No results for input(s): AMMONIA in the last 168 hours. CBC: Recent Labs  Lab 08/29/18 2595  08/31/18 0745  09/01/18 6387  09/03/18 5643 09/03/18 0427 09/04/18 0430 09/05/18 0250  WBC 12.1*   < > 7.7  --  8.1  --  8.0  --  11.1* 10.4  NEUTROABS 11.1*  --   --   --   --   --   --   --   --   --   HGB 7.1*   < > 10.8*   < > 10.4*   < > 11.2* 10.5* 11.5* 12.1  HCT 21.2*   < > 32.0*   < > 30.4*   < > 33.9* 31.0* 35.9* 36.9  MCV 112.2*   < > 99.4  --  96.2  --  102.7*  --  103.2* 102.2*  PLT 213   < > 179  --  179  --  183  --  237 197   < > = values in this interval not displayed.   Cardiac Enzymes: Recent Labs  Lab 08/29/18 0941  TROPONINI 0.07*   CBG: Recent Labs  Lab 09/04/18 1550 09/04/18 1638 09/04/18 1926 09/04/18 2319 09/05/18 0330  GLUCAP 31* 187* 149* 128* 132*    Iron Studies: No results for input(s): IRON, TIBC, TRANSFERRIN, FERRITIN in the last 72 hours. Studies/Results: Dg Chest Port 1 View  Result Date: 09/04/2018 CLINICAL DATA:  Acute respiratory failure with hypoxia. Follow-up exam. EXAM: PORTABLE CHEST 1 VIEW COMPARISON:  09/03/2018 and older exams. FINDINGS: Continued improvement. Decreased opacity at the left lung base with the left hemidiaphragm not clearly visible. Persistent interstitial type opacities are noted most evident in the right mid lung. No new lung abnormalities. Endotracheal tube, nasogastric tube and dual lumen right internal jugular central venous line are stable. No pneumothorax. IMPRESSION: 1. Continued improvement with improved aeration most evident at the left lung base. No new abnormalities. 2. Support apparatus is stable. Electronically Signed   By: Lajean Manes M.D.   On:  09/04/2018 08:57   . chlorhexidine gluconate (MEDLINE KIT)  15 mL Mouth Rinse BID  . Chlorhexidine Gluconate Cloth  6 each Topical Daily  . escitalopram  20 mg Per Tube Daily  . feeding supplement (PRO-STAT SUGAR FREE 64)  30 mL Per Tube Daily  . folic acid  1 mg Oral Daily  . heparin  5,000 Units Subcutaneous Q8H  . hydrALAZINE  50 mg Per Tube Q8H  . hydrocortisone sod succinate (SOLU-CORTEF) inj  50 mg Intravenous Q6H  . hydroxychloroquine  200 mg Oral Daily  . insulin aspart  0-15 Units Subcutaneous Q4H  . levothyroxine  50 mcg Oral Q0600  . mouth rinse  15 mL Mouth Rinse 10 times per day  . metoprolol tartrate  25 mg Per Tube BID  . midazolam  2 mg Intravenous Once  . pantoprazole (PROTONIX) IV  40 mg Intravenous QHS  . polyethylene glycol  17 g Oral BID  . QUEtiapine  400 mg Oral QHS  . sennosides  5 mL Per Tube BID  . sodium chloride flush  10-40 mL Intracatheter Q12H  . vitamin B-12  1,000 mcg Oral Daily    BMET    Component Value Date/Time   NA 137 09/05/2018 0250   K 5.1 09/05/2018 0250   CL 103 09/05/2018 0250   CO2 25 09/05/2018 0250   GLUCOSE 135 (H) 09/05/2018 0250   BUN 57 (H) 09/05/2018 0250   CREATININE 1.68 (H) 09/05/2018 0250   CREATININE 1.17 (H) 01/10/2013 1234   CALCIUM 9.4 09/05/2018 0250   GFRNONAA 33 (L) 09/05/2018 0250   GFRAA 38 (L) 09/05/2018 0250   CBC  Component Value Date/Time   WBC 10.4 09/05/2018 0250   RBC 3.61 (L) 09/05/2018 0250   HGB 12.1 09/05/2018 0250   HCT 36.9 09/05/2018 0250   PLT 197 09/05/2018 0250   MCV 102.2 (H) 09/05/2018 0250   MCH 33.5 09/05/2018 0250   MCHC 32.8 09/05/2018 0250   RDW 18.4 (H) 09/05/2018 0250   LYMPHSABS 0.3 (L) 08/29/2018 0747   MONOABS 0.5 08/29/2018 0747   EOSABS 0.0 08/29/2018 0747   BASOSABS 0.0 08/29/2018 0747    Assessment/Plan: 1. AKI/CKD stage 3 in setting of sepsis related to pneumonia. She was hypotensive overnight and likely ischemic ATN. She hadreceived 5 liters of LR upon  admission without UOP. Complements normal so not related to lupus flare. Has been on CVVHD since 08/31/18 and UOP initially improved but has dropped off over the past 48hours. 1. bladder scanwith 525m so she does have some urinary retentions/pfoley. 2. Renal UKoreaconsistent with chronic medical renal disease but no obstruction or masses. 3. Remains oliguric and worsening metabolic acidosis and hyperkalemiaand started CVVHD 08/31/18-09/04/18 4. Starting to make some urine.  Hopefully we are seeing renal recovery, otherwise will transition to IHD tomorrow.  5. Continue to follow UOP and Scr/electrolytes. 2. Sepsis due to pneumonia in immunocompromised patient. Not on pressors 3. VDRF- per PCCM 4. Metabolic adisosis - due to #1. improved with CVVHD 5. Pulmonary HTN- per PCCM 6. Volume overload-improved with CVVHDF.  Will start some lasix and follow off of CVVHD 7. AMS- likely due to sepsis and hyoxia 8. Anemia- transfuse prn. Felt to be related to SLE 9. 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 10. SLE- agree with stress dose steroids and holding imuran.  JDonetta Potts MD [employer]([phone]

## 2018-09-05 NOTE — Progress Notes (Signed)
NAME:  Anna [name], MRN:  [id number], DOB:  05/30/1961, LOS: 7 ADMISSION DATE:  08/29/2018, CONSULTATION DATE:  2/23 REFERRING MD:  Venora Maples, CHIEF COMPLAINT:  Acute hypoxic respiratory failure and Pneumonia   Brief History   58 year old female patient on Imuran, Plaquenil, and prednisone for history of lupus.  Admitted 2/23 with pneumonia and respiratory failure, intubated 2/23.   History of present illness   58 year old female with history of lupus, on Imuran, Plaquenil, and prednisone at baseline, seen in our office 2/18 for routine pulmonary follow-up, no acute pulmonary symptoms at that point.  Presented to the emergency room on 2/23 with 24-hour history of cough, myalgias, worsening shortness of breath, and altered sensorium.  EMS called, temperature 101.  On arrival found to be tachypneic, hypoxic With room air saturations 85%, and also new marked right-sided infiltrate.  Was placed on noninvasive positive pressure ventilation with improvement in work of breathing however did remain tachypneic and tachycardic.  Pulmonary admitting to intensive care given high risk of clinical decompensation  Past Medical History  Lupus on Imuran and Plaquenil as well as prednisone 5 mg daily.  Lupus primarily affecting joints, no evidence of pulmonary involvement as of yet.  Pulmonary hypertension, tobacco abuse, hypertension, hyperlipidemia, depression, prior CVA, admitted in February 2018 for pneumonia with parapneumonic effusion, also just discharged from hospitalization in January 2024 benzodiazepine overdose  Significant Hospital Events   2/23: Presented the emergency room With 24-hour history of cough, myalgias, fever as high as 101 and altered sensorium chest x-ray demonstrating pulmonary infiltrate on the right, hypoxic and tachypneic requiring BiPAP support critical care admitting  Consults:  Nephrology   Procedures:    Significant Diagnostic Tests:    Micro Data:  Respiratory viral  panel 2/23>>>negative  Flu A and B 2/23>>>Negative Urine strep antigen 2/23>>>Negative  Urine Legionella antigen 2/23>>>POSITIVE Sputum culture 2/23>>> not done Blood culture 2/23>> negative Urine cx 2/24 >> negative   Antimicrobials:  Levaquin 2/24>>> 3/1  Interim history/subjective:  Still with some up-and-down blood pressure but overall more stable over the last 24 hours Remains on high-dose fentanyl, propofol is being weaned, almost off She is having some urine output since CVVH was stopped morning 2/29  Objective   Blood pressure 106/65, pulse 73, temperature 99.5 F (37.5 C), temperature source Axillary, resp. rate 15, height 5\' 6"  (1.676 m), weight 81.1 kg, last menstrual period 07/07/2002, SpO2 98 %. CVP:  [10 mmHg] 10 mmHg  Vent Mode: CPAP;PSV FiO2 (%):  [40 %] 40 % Set Rate:  [16 bmp] 16 bmp Vt Set:  [470 mL] 470 mL PEEP:  [5 cmH20] 5 cmH20 Pressure Support:  [10 cmH20] 10 cmH20 Plateau Pressure:  [12 cmH20-19 cmH20] 19 cmH20   Intake/Output Summary (Last 24 hours) at 09/05/2018 1252 Last data filed at 09/05/2018 1200 Gross per 24 hour  Intake 1742.91 ml  Output 1955 ml  Net -212.09 ml   Filed Weights   09/03/18 0500 09/04/18 0500 09/05/18 0500  Weight: 79.8 kg 79.8 kg 81.1 kg   Examination: General: Obese, ill-appearing woman, mechanically ventilated HENT: ET tube in place, no oral lesions Lungs: Distant, somewhat coarse, no wheeze, few scattered crackles Cardiovascular: Regular, 80s, distant, no murmur Abdomen: Soft, nondistended with positive bowel sounds Extremities: Trace edema right lower extremity, none on the left Neuro: Wide-awake, a bit agitated, moves all extremities, does not to questions Skin: No breakdown, no rash   Resolved Hospital Problem list     Assessment & Plan:  Acute hypoxic  respiratory failure in the setting of pneumonia.  She is immunocompromised on Imuran and prednisone Chest x-ray reviewed by me 2/ 29, bilateral infiltrates  continue to improve especially on the left Plan PRVC, FiO2 now 40%, PEEP at 5 Goal initiate spontaneous breathing trials today 3/1 Imuran on hold Levofloxacin last day 3/1  Severe sepsis secondary to pneumonia, improved -Was volume responsive in the emergency room however did present with systolic pressure in the 78G.  Lactic acid was normal Plan Stop stress dose hydrocortisone 3/1, now with hypertension Antibiotics through 09/05/2018  Hypertension Plan Restarted hydralazine, metoprolol on 2/28.  Continues to have labile blood pressure, more hypertension than low.  Home Cozaar and nifedipine on hold, start clonidine 3/1  Pulmonary hypertension Plan Acute on chronic, following.  Wean FiO2 as able  Acute metabolic encephalopathy in the setting of sepsis and hypoxia Plan Stop propofol, add Precedex 3/1 as well as clonidine as above.  Hopefully this will allow decreased fentanyl drip, smoother wake up Continue Seroquel, Lexapro as ordered (dose is adjusted from her home regimen)  Acute on chronic renal failure stage III; appears as though baseline serum creatinine around 1.9 Plan Following urine output now off CVVHD since 2/29 Note that Lasix given by nephrology 3/1, Follow BMP Medications renally dosed  Fluid electrolyte balance/acid-base imbalance: Mild hyperchloremia, non-anion gap metabolic acidosis Plan Replete electrolytes as indicated  Macrocytic anemia anemia, 1 month ago hemoglobin 7.9, currently 7.1, she is followed by hematology at [facility], felt secondary to her lupus Plan Follow CBC Transfusion goal, hemoglobin greater than 7  Hilar lung mass Plan Planning for an outpatient PET scan, followed by Dr. [name].  May consider repeat CT chest if we feel that there is any contribution of a postobstructive process contributing to her current pneumonia  Hypothyroidism Plan Synthroid as ordered  Nicotine dependence Plan Smoking cessation efforts  History of  lupus Plan Imuran on hold, Plaquenil ordered   Best practice:  Diet: N.p.o. Pain/Anxiety/Delirium protocol (if indicated): yes, fentanyl/ propofol VAP protocol (if indicated): Not applicable DVT prophylaxis: Subcutaneous heparin GI prophylaxis: PPI Glucose control: Sliding scale insulin Mobility: Bedrest Code Status: Full code Family Communication: None present on 2/29 Disposition: ICU  Labs   CBC: Recent Labs  Lab 08/31/18 0745  09/01/18 0333  09/02/18 1237 09/03/18 0347 09/03/18 0427 09/04/18 0430 09/05/18 0250  WBC 7.7  --  8.1  --   --  8.0  --  11.1* 10.4  HGB 10.8*   < > 10.4*   < > 17.7* 11.2* 10.5* 11.5* 12.1  HCT 32.0*   < > 30.4*   < > 52.0* 33.9* 31.0* 35.9* 36.9  MCV 99.4  --  96.2  --   --  102.7*  --  103.2* 102.2*  PLT 179  --  179  --   --  183  --  237 197   < > = values in this interval not displayed.    Basic Metabolic Panel: Recent Labs  Lab 09/01/18 0333  09/02/18 0409  09/03/18 0347  09/03/18 1524 09/04/18 0430 09/04/18 1542 09/04/18 1729 09/05/18 0250  NA 139   < > 136   < > 136   < > 135 135 143 137 137  K 3.5   < > 4.2   < > 4.6   < > 4.4 4.9 2.3* 5.0 5.1  CL 98   < > 99   < > 100  --  102 100 126* 101 103  CO2 29   < >  26   < > 27  --  26 25 15* 25 25  GLUCOSE 147*   < > 146*   < > 126*  --  136* 162* 81 174* 135*  BUN 58*   < > 35*   < > 32*  --  37* 34* 25* 46* 57*  CREATININE 1.70*   < > 1.11*   < > 1.09*  --  1.07* 1.10* 0.62 1.47* 1.68*  CALCIUM 6.9*   < > 7.9*   < > 8.4*  --  8.2* 8.9 4.3* 9.0 9.4  MG 2.0  --  2.6*  --  2.5*  --   --  2.4  --   --  2.3  PHOS 3.4   < > 2.8   < > 2.7  --  2.6 2.2* 1.3* 2.8 4.0   < > = values in this interval not displayed.   GFR: Estimated Creatinine Clearance: 38.7 mL/min (A) (by C-G formula based on SCr of 1.68 mg/dL (H)). Recent Labs  Lab 08/30/18 0458 08/31/18 0243  09/01/18 0333 09/03/18 0347 09/04/18 0430 09/05/18 0250  PROCALCITON 15.47 12.98  --   --   --   --   --   WBC 9.9   --    < > 8.1 8.0 11.1* 10.4   < > = values in this interval not displayed.    Liver Function Tests: Recent Labs  Lab 09/03/18 1524 09/04/18 0430 09/04/18 1542 09/04/18 1729 09/05/18 0250  ALBUMIN 2.1* 2.4* 1.1* 2.4* 2.5*   No results for input(s): LIPASE, AMYLASE in the last 168 hours. No results for input(s): AMMONIA in the last 168 hours.  ABG    Component Value Date/Time   PHART 7.389 09/03/2018 0427   PCO2ART 44.8 09/03/2018 0427   PO2ART 78.0 (L) 09/03/2018 0427   HCO3 27.2 09/03/2018 0427   TCO2 29 09/03/2018 0427   ACIDBASEDEF 12.0 (H) 08/31/2018 0350   O2SAT 96.0 09/03/2018 0427     Coagulation Profile: No results for input(s): INR, PROTIME in the last 168 hours.  Cardiac Enzymes: No results for input(s): CKTOTAL, CKMB, CKMBINDEX, TROPONINI in the last 168 hours.  HbA1C: No results found for: HGBA1C  CBG: Recent Labs  Lab 09/04/18 1926 09/04/18 2319 09/05/18 0330 09/05/18 0749 09/05/18 1143  GLUCAP 149* 128* 132* 139* 141*   The patient is critically ill with multiple organ systems failure and requires high complexity decision making for assessment and support, frequent evaluation and titration of therapies, application of advanced monitoring technologies and extensive interpretation of multiple databases.   Independent critical care time 33 minutes  Baltazar Apo, MD, PhD 09/05/2018, 12:52 PM [facility] [phone] or if no answer [phone]

## 2018-09-06 LAB — RENAL FUNCTION PANEL
Albumin: 2.1 g/dL — ABNORMAL LOW (ref 3.5–5.0)
Anion gap: 11 (ref 5–15)
BUN: 84 mg/dL — ABNORMAL HIGH (ref 6–20)
CO2: 23 mmol/L (ref 22–32)
Calcium: 8.5 mg/dL — ABNORMAL LOW (ref 8.9–10.3)
Chloride: 105 mmol/L (ref 98–111)
Creatinine, Ser: 1.92 mg/dL — ABNORMAL HIGH (ref 0.44–1.00)
GFR calc Af Amer: 32 mL/min — ABNORMAL LOW (ref >60)
GFR calc non Af Amer: 28 mL/min — ABNORMAL LOW (ref >60)
Glucose, Bld: 126 mg/dL — ABNORMAL HIGH (ref 70–99)
Phosphorus: 5.4 mg/dL — ABNORMAL HIGH (ref 2.5–4.6)
Potassium: 4.3 mmol/L (ref 3.5–5.1)
Sodium: 139 mmol/L (ref 135–145)

## 2018-09-06 LAB — GLUCOSE, CAPILLARY
GLUCOSE-CAPILLARY: 122 mg/dL — AB (ref 70–99)
Glucose-Capillary: 36 mg/dL — CL (ref 70–99)
Glucose-Capillary: 123 mg/dL — ABNORMAL HIGH (ref 70–99)
Glucose-Capillary: 126 mg/dL — ABNORMAL HIGH (ref 70–99)
Glucose-Capillary: 121 mg/dL — ABNORMAL HIGH (ref 70–99)
Glucose-Capillary: 88 mg/dL (ref 70–99)
Glucose-Capillary: 64 mg/dL — ABNORMAL LOW (ref 70–99)
Glucose-Capillary: 106 mg/dL — ABNORMAL HIGH (ref 70–99)
Glucose-Capillary: 81 mg/dL (ref 70–99)
Glucose-Capillary: 72 mg/dL (ref 70–99)

## 2018-09-06 LAB — MAGNESIUM: Magnesium: 1.9 mg/dL (ref 1.7–2.4)

## 2018-09-06 LAB — APTT: APTT: 40 s — AB (ref 24–36)

## 2018-09-06 MED ORDER — METOPROLOL TARTRATE 25 MG PO TABS
25.0000 mg | ORAL_TABLET | Freq: Two times a day (BID) | ORAL | Status: DC
  Administered 2018-09-06 – 2018-09-11 (×10): 25 mg via ORAL
  Filled 2018-09-06 (×10): qty 1

## 2018-09-06 MED ORDER — ORAL CARE MOUTH RINSE: 15.0000 mL | Freq: Two times a day (BID) | OROMUCOSAL | Status: DC

## 2018-09-06 MED ORDER — SENNOSIDES 8.8 MG/5ML PO SYRP
5.0000 mL | ORAL_SOLUTION | Freq: Two times a day (BID) | ORAL | Status: DC
  Administered 2018-09-07: 5 mL via ORAL
  Filled 2018-09-06 (×10): qty 5

## 2018-09-06 MED ORDER — ESCITALOPRAM OXALATE 20 MG PO TABS
20.0000 mg | ORAL_TABLET | Freq: Every day | ORAL | Status: DC
  Administered 2018-09-07 – 2018-09-12 (×6): 20 mg via ORAL
  Filled 2018-09-06 (×5): qty 1
  Filled 2018-09-06: qty 2

## 2018-09-06 MED ORDER — HYDRALAZINE HCL 20 MG/ML IJ SOLN
5.0000 mg | INTRAMUSCULAR | Status: AC | PRN
  Administered 2018-09-06 (×2): 5 mg via INTRAVENOUS
  Filled 2018-09-06 (×2): qty 1

## 2018-09-06 MED ORDER — CHLORHEXIDINE GLUCONATE 0.12 % MT SOLN
15.0000 mL | Freq: Two times a day (BID) | OROMUCOSAL | Status: DC
  Administered 2018-09-06 – 2018-09-07 (×2): 15 mL via OROMUCOSAL
  Filled 2018-09-06 (×2): qty 15

## 2018-09-06 MED ORDER — HYDRALAZINE HCL 50 MG PO TABS
50.0000 mg | ORAL_TABLET | Freq: Three times a day (TID) | ORAL | Status: DC
  Administered 2018-09-06 – 2018-09-12 (×17): 50 mg via ORAL
  Filled 2018-09-06 (×16): qty 1

## 2018-09-06 MED ORDER — DEXTROSE 50 % IV SOLN
12.5000 g | Freq: Once | INTRAVENOUS | Status: AC
  Administered 2018-09-06: 12.5 g via INTRAVENOUS
  Filled 2018-09-06: qty 50

## 2018-09-06 MED ORDER — METOPROLOL TARTRATE 25 MG/10 ML ORAL SUSPENSION: 25.0000 mg | Freq: Two times a day (BID) | ORAL | Status: DC

## 2018-09-06 NOTE — Progress Notes (Signed)
Hypoglycemic Event  CBG: 64  Treatment: D50 25 mL (12.5 gm)  Symptoms: None  Follow-up CBG: Time:1614 CBG Result:106  Possible Reasons for Event: Inadequate meal intake  Comments/MD notified: patient asymptomatic during event.  RN waiting on speech evaluation to see if patient's diet can be advanced.     Doretha Sou A

## 2018-09-06 NOTE — Procedures (Signed)
Extubation Procedure Note  Patient Details:   Name: Anna [name] DOB: February 16, 1961 MRN: [id number]   Airway Documentation:    Vent end date: 09/06/18 Vent end time: 1050   Evaluation  O2 sats: stable throughout Complications: No apparent complications Patient did tolerate procedure well. Bilateral Breath Sounds: Rhonchi, Diminished   Yes   PT was extubated to  3l [place]  Pt was able to speak and clear secretions   Sats are stable  RT to monitor   [name], Leonie Douglas 09/06/2018, 10:51 AM

## 2018-09-06 NOTE — Progress Notes (Signed)
[facility] Progress Note Patient Name: Anna [name] DOB: 21-May-1961 MRN: [id number]   Date of Service  09/06/2018  HPI/Events of Note  SBP running high > 160. Off of CVVHD. Cr < 2. SLE/PNA  eICU Interventions  IV Hydralazine 5 mg q2 hr x 2 doses prn for SBP > 160 for now.      Intervention Category Intermediate Interventions: Hypertension - evaluation and management  Elmer Sow 09/06/2018, 12:17 AM

## 2018-09-06 NOTE — Evaluation (Signed)
Physical Therapy Evaluation Patient Details Name: Anna [name] MRN: [id number] DOB: 14-Mar-1961 Today's Date: 09/06/2018   History of Present Illness   Pt adm with PNA and resp failure. Intubated 2/23 and extubated 3/2. PMH includes bipolar disorder, CHF, CVA, lupus, CKD, HTN.  Clinical Impression  Pt admitted with above diagnosis and presents to PT with functional limitations due to deficits listed below (See PT problem list). Pt needs skilled PT to maximize independence and safety to allow discharge to SNF. Pt had prior SNF stay after January hospitalization and had only been home ~1 week prior to readmission. Husband works and cannot provide 24 hour assist. Pt continues to show poor safety awareness and poor awareness of deficits as she did during last visit.      Follow Up Recommendations SNF;Supervision/Assistance - 24 hour    Equipment Recommendations  None recommended by PT    Recommendations for Other Services       Precautions / Restrictions Precautions Precautions: Fall      Mobility  Bed Mobility Overal bed mobility: Needs Assistance Bed Mobility: Supine to Sit;Sit to Supine     Supine to sit: Min assist Sit to supine: Min assist   General bed mobility comments: Assist to elevate trunk into sitting. Assist to bring feet back up onto bed returning to supine.  Transfers Overall transfer level: Needs assistance Equipment used: Rolling walker (2 wheeled);2 person hand held assist Transfers: Sit to/from [facility] Sit to Stand: +2 physical assistance;Min assist Stand pivot transfers: Mod assist;+2 safety/equipment       General transfer comment: Assist to bring hips up and for balance. With stand pivot from bed to bsc to bed pt with knees hyperextended and difficulty taking pivotal steps. When standing with walker pt able to stand with min assist.  Ambulation/Gait Ambulation/Gait assistance: Min assist;+2 safety/equipment Gait Distance (Feet):  3 Feet Assistive device: Rolling walker (2 wheeled) Gait Pattern/deviations: Step-through pattern;Decreased step length - right;Decreased step length - left;Shuffle;Trunk flexed Gait velocity: decr Gait velocity interpretation: <1.31 ft/sec, indicative of household ambulator General Gait Details: Pt with unstable gait with walker requiring assist for balance and support. Amb 3' forward and then 3' backwards  Stairs            Wheelchair Mobility    Modified Rankin (Stroke Patients Only)       Balance Overall balance assessment: Needs assistance Sitting-balance support: No upper extremity supported;Feet supported Sitting balance-Leahy Scale: Fair     Standing balance support: Bilateral upper extremity supported Standing balance-Leahy Scale: Poor Standing balance comment: walker and min assist for statice standing                             Pertinent Vitals/Pain Pain Assessment: No/denies pain    Home Living Family/patient expects to be discharged to:: Private residence Living Arrangements: Spouse/significant other Available Help at Discharge: Family;Available PRN/intermittently Type of Home: House Home Access: Stairs to enter   Entrance Stairs-Number of Steps: 1 Home Layout: One level;Able to live on main level with bedroom/bathroom Home Equipment: Gilford Rile - 2 wheels;Cane - single point Additional Comments: husband works    Prior Function Level of Independence: Needs Water engineer / Transfers Assistance Needed: amb with rolling walker. History of falls. Only home 1 week from SNF prior to admission           Hand Dominance   Dominant Hand: Right    Extremity/Trunk Assessment  Upper Extremity Assessment Upper Extremity Assessment: Defer to OT evaluation    Lower Extremity Assessment Lower Extremity Assessment: Generalized weakness       Communication   Communication: No difficulties  Cognition Arousal/Alertness:  Awake/alert Behavior During Therapy: Impulsive Overall Cognitive Status: Impaired/Different from baseline Area of Impairment: Attention;Memory;Safety/judgement;Awareness;Problem solving                   Current Attention Level: Selective Memory: Decreased short-term memory   Safety/Judgement: Decreased awareness of safety;Decreased awareness of deficits Awareness: Intellectual Problem Solving: Requires verbal cues;Requires tactile cues        General Comments      Exercises     Assessment/Plan    PT Assessment Patient needs continued PT services  PT Problem List Decreased strength;Decreased activity tolerance;Decreased balance;Decreased mobility;Decreased cognition;Decreased safety awareness       PT Treatment Interventions DME instruction;Gait training;Functional mobility training;Therapeutic activities;Balance training;Therapeutic exercise;Patient/family education    PT Goals (Current goals can be found in the Care Plan section)  Acute Rehab PT Goals Patient Stated Goal: go home PT Goal Formulation: With patient/family Time For Goal Achievement: 09/20/18 Potential to Achieve Goals: Good    Frequency Min 3X/week   Barriers to discharge Decreased caregiver support does not have 24 hour assist    Co-evaluation               AM-PAC PT "6 Clicks" Mobility  Outcome Measure Help needed turning from your back to your side while in a flat bed without using bedrails?: A Little Help needed moving from lying on your back to sitting on the side of a flat bed without using bedrails?: A Little Help needed moving to and from a bed to a chair (including a wheelchair)?: A Lot Help needed standing up from a chair using your arms (e.g., wheelchair or bedside chair)?: A Little Help needed to walk in hospital room?: A Lot Help needed climbing 3-5 steps with a railing? : Total 6 Click Score: 14    End of Session Equipment Utilized During Treatment: Gait belt Activity  Tolerance: Patient limited by fatigue Patient left: in bed;with call bell/phone within reach;with bed alarm set;with family/visitor present Nurse Communication: Mobility status PT Visit Diagnosis: Unsteadiness on feet (R26.81);Other abnormalities of gait and mobility (R26.89);Muscle weakness (generalized) (M62.81);History of falling (Z91.81)    Time: 3794-3276 PT Time Calculation (min) (ACUTE ONLY): 21 min   Charges:   PT Evaluation $PT Eval Moderate Complexity: [place] Pager [phone] Office [place] 09/06/2018, 4:23 PM

## 2018-09-06 NOTE — Progress Notes (Addendum)
Patient extubated, patient alert and oriented however appears to have some confusion/delirium in recalling events during her intubation. Patient advised RN she wishes to leave the hospital.  Patient stated she feels very anxious in the hospital and she wishes to be home. RN discussed that leaving the hospital would be against medical advice but patient is insistent on leaving the hospital and requested RN call her husband.  RN informed MD Nelda Marseille of patient's desire to leave the hospital and patient's husband called.   RN provided emotional support to patient and discussed her concerns such as being cold, feeling anxious, and wish to be at home.  Patient is considering staying at the hospital but is undecided.   Patient has advised RN she is agreeable to stay in the hospital at this time for tonight.

## 2018-09-06 NOTE — Progress Notes (Signed)
100 mL of fentanyl wasted. Witnessed by Doretha Sou, RN.

## 2018-09-06 NOTE — Progress Notes (Signed)
[facility] Progress Note Patient Name: Anna [name] DOB: 08-21-1960 MRN: [id number]   Date of Service  09/06/2018  HPI/Events of Note  Pt extubated earlier today and passed swallow evaluation with apple sauce.  eICU Interventions  Changed med administration to oral route.     Intervention Category Minor Interventions: Other:  Elsie Lincoln 09/06/2018, 10:02 PM

## 2018-09-06 NOTE — Progress Notes (Signed)
Anna [name]  Assessment/ Plan: Pt is a 58 y.o. yo female with history of lupus on Imuran Plaquenil and prednisone admitted with sepsis due to pneumonia and ischemic ATN.  #AKI on CKD stage III in the setting of sepsis and ischemic ATN.  Patient required CVVHD until 2/29.  Received a dose of Lasix yesterday with urine output of 2.6 L in 24-hour.  Serum creatinine level 1.92 stable.  Electrolytes are stable.  She looks euvolemic.  No further need for dialysis and continue to monitor for renal recovery.  Avoid nephrotoxins.  #Sepsis due to pneumonia in immunocompromised patient.  #Altered mental status due to sepsis and hypoxia.  Precedex for agitation.  #Respiratory failure on ventilator per PCCM.  #Anemia on hemoglobin is stable.  #SLE: On Plaquenil.  Imuran on hold.   Subjective: Seen and examined in ICU.  Off CRRT.  On ventilator, sedated.  No new event.  Made significant of her urine output in last 24 hours. Objective Vital signs in last 24 hours: Vitals:   09/06/18 0630 09/06/18 0700 09/06/18 0735 09/06/18 0753  BP: (!) 143/86 (!) 143/87  (!) 149/91  Pulse: (!) 51 (!) 49  (!) 54  Resp: '16 16  16  '$ Temp:   (!) 94 F (34.4 C)   TempSrc:   Axillary   SpO2: 97% 97%  93%  Weight:      Height:       Weight change: 0.9 kg  Intake/Output Summary (Last 24 hours) at 09/06/2018 0757 Last data filed at 09/06/2018 0700 Gross per 24 hour  Intake 2806.76 ml  Output 2795 ml  Net 11.76 ml       Labs: Basic Metabolic Panel: Recent Labs  Lab 09/05/18 0250 09/05/18 1545 09/06/18 0422  NA 137 137 139  K 5.1 5.0 4.3  CL 103 102 105  CO2 '25 22 23  '$ GLUCOSE 135* 153* 126*  BUN 57* 75* 84*  CREATININE 1.68* 1.94* 1.92*  CALCIUM 9.4 9.1 8.5*  PHOS 4.0 6.0* 5.4*   Liver Function Tests: Recent Labs  Lab 09/05/18 0250 09/05/18 1545 09/06/18 0422  ALBUMIN 2.5* 2.2* 2.1*   No results for input(s): LIPASE, AMYLASE in the last 168 hours. No  results for input(s): AMMONIA in the last 168 hours. CBC: Recent Labs  Lab 08/31/18 0745  09/01/18 0333  09/03/18 0347 09/03/18 0427 09/04/18 0430 09/05/18 0250  WBC 7.7  --  8.1  --  8.0  --  11.1* 10.4  HGB 10.8*   < > 10.4*   < > 11.2* 10.5* 11.5* 12.1  HCT 32.0*   < > 30.4*   < > 33.9* 31.0* 35.9* 36.9  MCV 99.4  --  96.2  --  102.7*  --  103.2* 102.2*  PLT 179  --  179  --  183  --  237 197   < > = values in this interval not displayed.   Cardiac Enzymes: No results for input(s): CKTOTAL, CKMB, CKMBINDEX, TROPONINI in the last 168 hours. CBG: Recent Labs  Lab 09/05/18 1143 09/05/18 1551 09/06/18 0022 09/06/18 0455 09/06/18 0732  GLUCAP 141* 132* 122* 126* 121*    Iron Studies: No results for input(s): IRON, TIBC, TRANSFERRIN, FERRITIN in the last 72 hours. Studies/Results: Dg Chest Port 1 View  Result Date: 09/05/2018 CLINICAL DATA:  Acute respiratory failure EXAM: PORTABLE CHEST 1 VIEW COMPARISON:  09/04/2018 FINDINGS: Endotracheal tube and gastric catheter are again seen and stable. Right jugular central line is  again noted and stable. Lungs are well aerated bilaterally. Persistent right basilar interstitial opacities are noted. Slight increase in left basilar atelectasis is seen when compared with the prior exam. IMPRESSION: Stable opacities in the right lung base with slight increase in left basilar atelectasis. Electronically Signed   By: Inez Catalina M.D.   On: 09/05/2018 08:19    Medications: Infusions: . sodium chloride 10 mL/hr at 09/06/18 0700  . dexmedetomidine (PRECEDEX) IV infusion 1 mcg/kg/hr (09/06/18 0731)  . feeding supplement (VITAL HIGH PROTEIN) 1,000 mL (09/05/18 0945)  . fentaNYL infusion INTRAVENOUS 200 mcg/hr (09/06/18 0700)  . heparin 10,000 units/ 20 mL infusion syringe 800 Units/hr (09/04/18 1200)  . propofol (DIPRIVAN) infusion Stopped (09/05/18 1043)    Scheduled Medications: . chlorhexidine gluconate (MEDLINE KIT)  15 mL Mouth Rinse BID   . Chlorhexidine Gluconate Cloth  6 each Topical Daily  . cloNIDine  0.1 mg Per Tube BID  . escitalopram  20 mg Per Tube Daily  . feeding supplement (PRO-STAT SUGAR FREE 64)  30 mL Per Tube Daily  . folic acid  1 mg Oral Daily  . heparin  5,000 Units Subcutaneous Q8H  . hydrALAZINE  50 mg Per Tube Q8H  . hydroxychloroquine  200 mg Oral Daily  . insulin aspart  0-15 Units Subcutaneous Q4H  . levothyroxine  50 mcg Oral Q0600  . mouth rinse  15 mL Mouth Rinse 10 times per day  . metoprolol tartrate  25 mg Per Tube BID  . midazolam  2 mg Intravenous Once  . pantoprazole (PROTONIX) IV  40 mg Intravenous QHS  . polyethylene glycol  17 g Oral BID  . QUEtiapine  400 mg Oral QHS  . sennosides  5 mL Per Tube BID  . sodium chloride flush  10-40 mL Intracatheter Q12H  . vitamin B-12  1,000 mcg Oral Daily    have reviewed scheduled and prn medications.  Physical Exam: General: Intubated, sedated Heart:RRR, s1s2 nl, no rubs Lungs: Coarse breath sound bilateral, no wheezing or crackle Abdomen:soft, Non-tender, non-distended Extremities:No edema Neurology: Sedated   Prasad  09/06/2018,7:57 AM  LOS: 8 days

## 2018-09-06 NOTE — Progress Notes (Signed)
NAME:  Anna [name], MRN:  [id number], DOB:  01-13-1961, LOS: 8 ADMISSION DATE:  08/29/2018, CONSULTATION DATE:  2/23 REFERRING MD:  Venora Maples, CHIEF COMPLAINT:  Acute hypoxic respiratory failure and Pneumonia   Brief History   58 year old female patient on Imuran, Plaquenil, and prednisone for history of lupus.  Admitted 2/23 with pneumonia and respiratory failure, intubated 2/23.   History of present illness   58 year old female with history of lupus, on Imuran, Plaquenil, and prednisone at baseline, seen in our office 2/18 for routine pulmonary follow-up, no acute pulmonary symptoms at that point.  Presented to the emergency room on 2/23 with 24-hour history of cough, myalgias, worsening shortness of breath, and altered sensorium.  EMS called, temperature 101.  On arrival found to be tachypneic, hypoxic With room air saturations 85%, and also new marked right-sided infiltrate.  Was placed on noninvasive positive pressure ventilation with improvement in work of breathing however did remain tachypneic and tachycardic.  Pulmonary admitting to intensive care given high risk of clinical decompensation  Past Medical History  Lupus on Imuran and Plaquenil as well as prednisone 5 mg daily.  Lupus primarily affecting joints, no evidence of pulmonary involvement as of yet.  Pulmonary hypertension, tobacco abuse, hypertension, hyperlipidemia, depression, prior CVA, admitted in February 2018 for pneumonia with parapneumonic effusion, also just discharged from hospitalization in January 2024 benzodiazepine overdose  Significant Hospital Events   2/23: Presented the emergency room With 24-hour history of cough, myalgias, fever as high as 101 and altered sensorium chest x-ray demonstrating pulmonary infiltrate on the right, hypoxic and tachypneic requiring BiPAP support critical care admitting  Consults:  Nephrology   Procedures:    Significant Diagnostic Tests:    Micro Data:  Respiratory viral  panel 2/23>>>negative  Flu A and B 2/23>>>Negative Urine strep antigen 2/23>>>Negative  Urine Legionella antigen 2/23>>>POSITIVE Sputum culture 2/23>>> not done Blood culture 2/23>> negative Urine cx 2/24 >> negative   Antimicrobials:  Levaquin 2/24>>> 3/1  Interim history/subjective:  No events overnight, weaning well this AM  Objective   Blood pressure (!) 154/81, pulse (!) 54, temperature (!) 94 F (34.4 C), temperature source Axillary, resp. rate 16, height 5\' 6"  (1.676 m), weight 82 kg, last menstrual period 07/07/2002, SpO2 93 %.    Vent Mode: PRVC FiO2 (%):  [40 %] 40 % Set Rate:  [16 bmp] 16 bmp Vt Set:  [470 mL] 470 mL PEEP:  [5 cmH20] 5 cmH20 Pressure Support:  [10 cmH20] 10 cmH20 Plateau Pressure:  [15 cmH20-19 cmH20] 18 cmH20   Intake/Output Summary (Last 24 hours) at 09/06/2018 1004 Last data filed at 09/06/2018 5638 Gross per 24 hour  Intake 2404.8 ml  Output 2345 ml  Net 59.8 ml   Filed Weights   09/04/18 0500 09/05/18 0500 09/06/18 0500  Weight: 79.8 kg 81.1 kg 82 kg   Examination: General: Chronically ill appearing female, NAD HENT: ETT in place, [place]/AT, PERRL, EOM-I and MMM Lungs: Coarse BS diffusely Cardiovascular: RRR, Nl S1/S2 and -M/R/G Abdomen: Soft, nondistended with positive bowel sounds Extremities: Trace edema right lower extremity, none on the left Neuro: Awake and interactive, moving all ext to command Skin: No breakdown, no rash  Resolved Hospital Problem list     Assessment & Plan:  Acute hypoxic respiratory failure in the setting of pneumonia.  She is immunocompromised on Imuran and prednisone Chest x-ray reviewed by me 2/ 29, bilateral infiltrates continue to improve especially on the left Plan Extubate today IS OOB PT Titrate O2  for sat of 88-92% May consider starting imuran in a few days Levofloxacin last day 3/1  Severe sepsis secondary to pneumonia, improved -Was volume responsive in the emergency room however did present  with systolic pressure in the 60Y.  Lactic acid was normal Plan Holding stress dose steroids for now (hypertensive) Course of abx completed by 3/1  Hypertension Plan Restarted hydralazine, metoprolol on 2/28.   Continues to have labile blood pressure, more hypertension than low.   Home Cozaar and nifedipine on hold, start clonidine 3/1 If continues to be hypertensive then will add home norvasc and cozaar  Pulmonary hypertension Plan Acute on chronic, following.  Wean FiO2 as able  Acute metabolic encephalopathy in the setting of sepsis and hypoxia Plan D/C propofol, precedex and fentanyl drips Continue Seroquel, Lexapro as ordered (dose is adjusted from her home regimen)  Acute on chronic renal failure stage III; appears as though baseline serum creatinine around 1.9 Plan Following urine output now off CVVHD since 2/29 Note that Lasix given by nephrology 3/1, Follow BMP Medications renally dosed  Fluid electrolyte balance/acid-base imbalance: Mild hyperchloremia, non-anion gap metabolic acidosis Plan Replete electrolytes as indicated AM labs as ordered  Macrocytic anemia anemia, 1 month ago hemoglobin 7.9, currently 7.1, she is followed by hematology at [facility] felt secondary to her lupus Plan Follow CBC Transfusion goal, hemoglobin greater than 7  Hilar lung mass Plan Planning for an outpatient PET scan, followed by Dr. [name].  May consider repeat CT chest if we feel that there is any contribution of a postobstructive process contributing to her current pneumonia  Hypothyroidism Plan Synthroid as ordered  Nicotine dependence Plan Smoking cessation efforts  History of lupus Plan Imuran on hold, Plaquenil ordered  Labs   CBC: Recent Labs  Lab 08/31/18 0745  09/01/18 0333  09/02/18 1237 09/03/18 0347 09/03/18 0427 09/04/18 0430 09/05/18 0250  WBC 7.7  --  8.1  --   --  8.0  --  11.1* 10.4  HGB 10.8*   < > 10.4*   < > 17.7* 11.2* 10.5* 11.5* 12.1  HCT  32.0*   < > 30.4*   < > 52.0* 33.9* 31.0* 35.9* 36.9  MCV 99.4  --  96.2  --   --  102.7*  --  103.2* 102.2*  PLT 179  --  179  --   --  183  --  237 197   < > = values in this interval not displayed.    Basic Metabolic Panel: Recent Labs  Lab 09/02/18 0409  09/03/18 0347  09/04/18 0430 09/04/18 1542 09/04/18 1729 09/05/18 0250 09/05/18 1545 09/06/18 0422  NA 136   < > 136   < > 135 143 137 137 137 139  K 4.2   < > 4.6   < > 4.9 2.3* 5.0 5.1 5.0 4.3  CL 99   < > 100   < > 100 126* 101 103 102 105  CO2 26   < > 27   < > 25 15* 25 25 22 23   GLUCOSE 146*   < > 126*   < > 162* 81 174* 135* 153* 126*  BUN 35*   < > 32*   < > 34* 25* 46* 57* 75* 84*  CREATININE 1.11*   < > 1.09*   < > 1.10* 0.62 1.47* 1.68* 1.94* 1.92*  CALCIUM 7.9*   < > 8.4*   < > 8.9 4.3* 9.0 9.4 9.1 8.5*  MG 2.6*  --  2.5*  --  2.4  --   --  2.3  --  1.9  PHOS 2.8   < > 2.7   < > 2.2* 1.3* 2.8 4.0 6.0* 5.4*   < > = values in this interval not displayed.   GFR: Estimated Creatinine Clearance: 34.1 mL/min (A) (by C-G formula based on SCr of 1.92 mg/dL (H)). Recent Labs  Lab 08/31/18 0243  09/01/18 0333 09/03/18 0347 09/04/18 0430 09/05/18 0250  PROCALCITON 12.98  --   --   --   --   --   WBC  --    < > 8.1 8.0 11.1* 10.4   < > = values in this interval not displayed.    Liver Function Tests: Recent Labs  Lab 09/04/18 1542 09/04/18 1729 09/05/18 0250 09/05/18 1545 09/06/18 0422  ALBUMIN 1.1* 2.4* 2.5* 2.2* 2.1*   No results for input(s): LIPASE, AMYLASE in the last 168 hours. No results for input(s): AMMONIA in the last 168 hours.  ABG    Component Value Date/Time   PHART 7.389 09/03/2018 0427   PCO2ART 44.8 09/03/2018 0427   PO2ART 78.0 (L) 09/03/2018 0427   HCO3 27.2 09/03/2018 0427   TCO2 29 09/03/2018 0427   ACIDBASEDEF 12.0 (H) 08/31/2018 0350   O2SAT 96.0 09/03/2018 0427     Coagulation Profile: No results for input(s): INR, PROTIME in the last 168 hours.  Cardiac Enzymes: No  results for input(s): CKTOTAL, CKMB, CKMBINDEX, TROPONINI in the last 168 hours.  HbA1C: No results found for: HGBA1C  CBG: Recent Labs  Lab 09/05/18 1143 09/05/18 1551 09/06/18 0022 09/06/18 0455 09/06/18 0732  GLUCAP 141* 132* 122* 126* 121*   The patient is critically ill with multiple organ systems failure and requires high complexity decision making for assessment and support, frequent evaluation and titration of therapies, application of advanced monitoring technologies and extensive interpretation of multiple databases.   Critical Care Time devoted to patient care services described in this note is  33  Minutes. This time reflects time of care of this signee Dr Jennet Maduro. This critical care time does not reflect procedure time, or teaching time or supervisory time of PA/NP/Med student/Med Resident etc but could involve care discussion time.  Rush Farmer, M.D. [facility] Pulmonary/Critical Care Medicine. Pager: [phone]. After hours pager: [phone].

## 2018-09-06 NOTE — Evaluation (Signed)
Clinical/Bedside Swallow Evaluation Patient Details  Name: Anna [name] MRN: [id number] Date of Birth: 25-Oct-1960  Today's Date: 09/06/2018 Time: SLP Start Time (ACUTE ONLY): 4696 SLP Stop Time (ACUTE ONLY): 1710 SLP Time Calculation (min) (ACUTE ONLY): 18 min  Past Medical History:  Past Medical History:  Diagnosis Date  . Acute CHF ([place]) 07/2016  . Depressive disorder, not elsewhere classified   . Family history of adverse reaction to anesthesia    mother passed away during surgery  . Hypertension   . Hypothyroid    Dr. Wilson Singer  . Kidney stone   . Lupus (systemic lupus erythematosus) ([place])   . Migraine   . Pure hypercholesterolemia   . Stroke [facility])    Past Surgical History:  Past Surgical History:  Procedure Laterality Date  . CESAREAN SECTION    . ECTOPIC PREGNANCY SURGERY     times 2  . JOINT REPLACEMENT    . LAPAROSCOPY     with rt salpingectomy  . [place] SURGERY  2003  . TONSILLECTOMY    . TOTAL ABDOMINAL HYSTERECTOMY  2004   ovaries retained, DUB  . TUBAL LIGATION  1985   HPI:  Pt adm with PNA and resp failure. Intubated 2/23 and extubated 3/2. PMH includes bipolar disorder, CHF, CVA, lupus, CKD, HTN.   Assessment / Plan / Recommendation Clinical Impression  Pt has immediate coughing with thin liquids as well as delayed throat clearing and second swallows with purees. Her symptoms may be consistent with an acute, post-extubation dysphagia, although she reports her voice to be at baseline. Frequent eructation may be more suggestive of a more chronic esophageal component. She endorses taking a "reflux medicine" at home. Recommend allowing ice chips after oral care and meds in puree for today. SLP will f/u for readiness to start a PO diet versus need for instrumental testing. SLP Visit Diagnosis: Dysphagia, unspecified (R13.10)    Aspiration Risk  Moderate aspiration risk    Diet Recommendation NPO except meds;Ice chips PRN after oral care   Medication  Administration: Whole meds with puree    Other  Recommendations Oral Care Recommendations: Oral care QID;Oral care prior to ice chip/H20 Other Recommendations: Have oral suction available   Follow up Recommendations Skilled Nursing facility      Frequency and Duration min 2x/week  2 weeks       Prognosis Prognosis for Safe Diet Advancement: Good      Swallow Study   General HPI: Pt adm with PNA and resp failure. Intubated 2/23 and extubated 3/2. PMH includes bipolar disorder, CHF, CVA, lupus, CKD, HTN. Type of Study: Bedside Swallow Evaluation Previous Swallow Assessment: MBS in 2010 but results not available Diet Prior to this Study: NPO Temperature Spikes Noted: No Respiratory Status: Nasal cannula History of Recent Intubation: Yes Length of Intubations (days): 8 days Date extubated: 09/06/18 Behavior/Cognition: Alert;Cooperative;Requires cueing Oral Cavity Assessment: Within Functional Limits Oral Care Completed by SLP: Recent completion by staff Oral Cavity - Dentition: Adequate natural dentition Vision: Functional for self-feeding Self-Feeding Abilities: Able to feed self Patient Positioning: Upright in bed Baseline Vocal Quality: Other (comment)(deep; pt says it's at baseline) Volitional Cough: Weak Volitional Swallow: Able to elicit    Oral/Motor/Sensory Function Overall Oral Motor/Sensory Function: Within functional limits   Ice Chips Ice chips: Within functional limits Presentation: Spoon   Thin Liquid Thin Liquid: Impaired Presentation: Cup;Self Fed;Straw Pharyngeal  Phase Impairments: Cough - Immediate    Nectar Thick Nectar Thick Liquid: Not tested   Honey Thick  Honey Thick Liquid: Not tested   Puree Puree: Impaired Presentation: Self Fed;Spoon Pharyngeal Phase Impairments: Throat Clearing - Delayed   Solid     Solid: Not tested      Anna [name] 09/06/2018,5:25 PM  Pollyann Glen, M.A. [place] Acute Environmental education officer [phone] Office  [phone]

## 2018-09-07 ENCOUNTER — Inpatient Hospital Stay (HOSPITAL_COMMUNITY): Payer: BLUE CROSS/BLUE SHIELD

## 2018-09-07 DIAGNOSIS — R0902 Hypoxemia: Secondary | ICD-10-CM

## 2018-09-07 DIAGNOSIS — J810 Acute pulmonary edema: Secondary | ICD-10-CM

## 2018-09-07 LAB — GLUCOSE, CAPILLARY
GLUCOSE-CAPILLARY: 98 mg/dL (ref 70–99)
Glucose-Capillary: 101 mg/dL — ABNORMAL HIGH (ref 70–99)
Glucose-Capillary: 102 mg/dL — ABNORMAL HIGH (ref 70–99)
Glucose-Capillary: 74 mg/dL (ref 70–99)
Glucose-Capillary: 76 mg/dL (ref 70–99)
Glucose-Capillary: 78 mg/dL (ref 70–99)
Glucose-Capillary: 94 mg/dL (ref 70–99)

## 2018-09-07 LAB — BASIC METABOLIC PANEL
Anion gap: 9 (ref 5–15)
BUN: 88 mg/dL — ABNORMAL HIGH (ref 6–20)
CO2: 24 mmol/L (ref 22–32)
Calcium: 9.1 mg/dL (ref 8.9–10.3)
Chloride: 107 mmol/L (ref 98–111)
Creatinine, Ser: 2.2 mg/dL — ABNORMAL HIGH (ref 0.44–1.00)
GFR, EST AFRICAN AMERICAN: 28 mL/min — AB (ref >60)
GFR, EST NON AFRICAN AMERICAN: 24 mL/min — AB (ref >60)
Glucose, Bld: 76 mg/dL (ref 70–99)
Potassium: 5 mmol/L (ref 3.5–5.1)
Sodium: 140 mmol/L (ref 135–145)

## 2018-09-07 LAB — PHOSPHORUS: Phosphorus: 5.2 mg/dL — ABNORMAL HIGH (ref 2.5–4.6)

## 2018-09-07 LAB — CBC
HCT: 31 % — ABNORMAL LOW (ref 36.0–46.0)
Hemoglobin: 10.5 g/dL — ABNORMAL LOW (ref 12.0–15.0)
MCH: 34.7 pg — ABNORMAL HIGH (ref 26.0–34.0)
MCHC: 33.9 g/dL (ref 30.0–36.0)
MCV: 102.3 fL — ABNORMAL HIGH (ref 80.0–100.0)
Platelets: 193 10*3/uL (ref 150–400)
RBC: 3.03 MIL/uL — AB (ref 3.87–5.11)
RDW: 18.6 % — ABNORMAL HIGH (ref 11.5–15.5)
WBC: 7.8 10*3/uL (ref 4.0–10.5)
nRBC: 0 % (ref 0.0–0.2)

## 2018-09-07 LAB — TRIGLYCERIDES: Triglycerides: 82 mg/dL (ref <150)

## 2018-09-07 LAB — MAGNESIUM: Magnesium: 1.7 mg/dL (ref 1.7–2.4)

## 2018-09-07 MED ORDER — RESOURCE THICKENUP CLEAR PO POWD
ORAL | Status: DC | PRN
  Filled 2018-09-07 (×2): qty 125

## 2018-09-07 MED ORDER — ORAL CARE MOUTH RINSE
15.0000 mL | Freq: Two times a day (BID) | OROMUCOSAL | Status: DC
  Administered 2018-09-07 – 2018-09-12 (×7): 15 mL via OROMUCOSAL

## 2018-09-07 MED ORDER — PREDNISONE 5 MG PO TABS
5.0000 mg | ORAL_TABLET | Freq: Every day | ORAL | Status: DC
  Administered 2018-09-07 – 2018-09-12 (×6): 5 mg via ORAL
  Filled 2018-09-07 (×6): qty 1

## 2018-09-07 MED ORDER — CHLORHEXIDINE GLUCONATE CLOTH 2 % EX PADS
6.0000 | MEDICATED_PAD | Freq: Every day | CUTANEOUS | Status: DC
  Administered 2018-09-07 – 2018-09-11 (×5): 6 via TOPICAL

## 2018-09-07 NOTE — Progress Notes (Addendum)
NAME:  Anna [name], MRN:  [id number], DOB:  01-04-1961, LOS: 9 ADMISSION DATE:  08/29/2018, CONSULTATION DATE:  2/23 REFERRING MD:  Venora Maples, CHIEF COMPLAINT:  Acute hypoxic respiratory failure and Pneumonia   Brief History   58 year old female patient on Imuran, Plaquenil, and prednisone for history of lupus.  Admitted 2/23 with pneumonia and respiratory failure, intubated 2/23.   History of present illness   58 year old female with history of lupus, on Imuran, Plaquenil, and prednisone at baseline, seen in our office 2/18 for routine pulmonary follow-up, no acute pulmonary symptoms at that point.  Presented to the emergency room on 2/23 with 24-hour history of cough, myalgias, worsening shortness of breath, and altered sensorium.  EMS called, temperature 101.  On arrival found to be tachypneic, hypoxic With room air saturations 85%, and also new marked right-sided infiltrate.  Was placed on noninvasive positive pressure ventilation with improvement in work of breathing however did remain tachypneic and tachycardic.  Pulmonary admitting to intensive care given high risk of clinical decompensation  Past Medical History  Lupus on Imuran and Plaquenil as well as prednisone 5 mg daily.  Lupus primarily affecting joints, no evidence of pulmonary involvement as of yet.  Pulmonary hypertension, tobacco abuse, hypertension, hyperlipidemia, depression, prior CVA, admitted in February 2018 for pneumonia with parapneumonic effusion, also just discharged from hospitalization in January 2024 benzodiazepine overdose  Significant Hospital Events   2/23: Presented the emergency room With 24-hour history of cough, myalgias, fever as high as 101 and altered sensorium chest x-ray demonstrating pulmonary infiltrate on the right, hypoxic and tachypneic requiring BiPAP support critical care admitting  Consults:  Nephrology   Procedures:  2/23 intubated > 3/2  2/25 trialysis catheter insertion>>   Significant  Diagnostic Tests:    Micro Data:  Respiratory viral panel 2/23>>>negative  Flu A and B 2/23>>>Negative Urine strep antigen 2/23>>>Negative  Urine Legionella antigen 2/23>>>POSITIVE Sputum culture 2/23>>> not done Blood culture 2/23>> negative Urine cx 2/24 >> negative   Antimicrobials:  Levaquin 2/24>>> 3/1  Interim history/subjective:  No acute events overnight Requesting valium   Objective   Blood pressure (!) 113/56, pulse 86, temperature 98.5 F (36.9 C), temperature source Oral, resp. rate (!) 26, height 5\' 6"  (1.676 m), weight 78.5 kg, last menstrual period 07/07/2002, SpO2 93 %.        Intake/Output Summary (Last 24 hours) at 09/07/2018 0939 Last data filed at 09/07/2018 0800 Gross per 24 hour  Intake 425.55 ml  Output 575 ml  Net -149.45 ml   Filed Weights   09/05/18 0500 09/06/18 0500 09/07/18 0600  Weight: 81.1 kg 82 kg 78.5 kg   Examination:  General: Chronically ill appearing adult female, reclined in bed, NAD  HENT: mmm, patent nares, 3LNC Lungs:  Bilaterally course breath sounds.  No accessory muscle recruitment  Cardiovascular: rrr, s1s2, no r/g/m. 2+ pulses  Abdomen: soft, round, ndnt, normoactive x4 Extremities: no obvious joint deformity. Trace BLE edema  Neuro: Awake, alert, oriented x2. Some tangential thoughts  Skin: clean, dry, warm, intact  Resolved Hospital Problem list     Assessment & Plan:  Acute hypoxic respiratory failure in the setting of pneumonia.  She is immunocompromised on Imuran and prednisone -s/p course of levofloxacin ended 3/1  Plan Remains extubated, no respiratory distress Continue [place], currently 3LNC IS OOB Titrate O2 for sat of 88-92% Not currently on home imuran  Severe sepsis secondary to pneumonia, improved -Was volume responsive in the emergency room however did present with systolic  pressure in the 70s.  Lactic acid was normal Plan Stress dose steroids discontinued over the weekend in setting of acute  hypertension Course of abx completed by 3/1  Hypertension -home cozaar, nifedipine, metoprolol,  Plan Clonidine 0.1mg  BID Hydralazine 50mg  PO q8hr Hydralazine 5mg  q2hr PRN IV Lopressor 25mg  BID PO   Pulmonary hypertension Plan Acute on chronic Goal SpO2 88-92%   Encephalopathy, acute metabolic  -mood disorder -insomnia -home seroquel, valium, lexapro -? ICU delirium  Plan Continue Seroquel, Lexapro as ordered (dose is adjusted from her home regimen) No valium at this time ICU delirium precautions   Acute on chronic renal failure stage III;  -baseline Cr approx 1.9 -ischemic ATN Plan Appreciate nephrology recs  Following urine output now off CVVHD since 2/29 S/p Lasix given by nephrology 3/1  No indication for dialysis at this time, continue to monitor renal function Follow BMP, UOP Medications renally dosed   Fluid electrolyte balance/acid-base imbalance: Mild hyperchloremia, non-anion gap metabolic acidosis Plan Replete electrolytes as indicated AM labs as ordered  Macrocytic anemia anemia, 1 month ago hemoglobin 7.9, currently 7.1, she is followed by hematology at [facility], felt secondary to her lupus Plan Follow CBC Transfusion goal, hemoglobin greater than 7  Hilar lung mass Plan Plan for outpatient PET scan, followed by Dr. [name].  May consider repeat CT chest if we feel that there is any contribution of a postobstructive process contributing to her current pneumonia  Hypothyroidism Plan Synthroid as ordered  Nicotine dependence Plan Smoking cessation efforts  History of lupus Plan Imuran on hold, Plaquenil ordered Resuming home steroids   Labs   CBC: Recent Labs  Lab 09/01/18 0333  09/03/18 0347 09/03/18 0427 09/04/18 0430 09/05/18 0250 09/07/18 0607  WBC 8.1  --  8.0  --  11.1* 10.4 7.8  HGB 10.4*   < > 11.2* 10.5* 11.5* 12.1 10.5*  HCT 30.4*   < > 33.9* 31.0* 35.9* 36.9 31.0*  MCV 96.2  --  102.7*  --  103.2* 102.2* 102.3*  PLT 179   --  183  --  237 197 193   < > = values in this interval not displayed.    Basic Metabolic Panel: Recent Labs  Lab 09/03/18 0347  09/04/18 0430  09/04/18 1729 09/05/18 0250 09/05/18 1545 09/06/18 0422 09/07/18 0607  NA 136   < > 135   < > 137 137 137 139 140  K 4.6   < > 4.9   < > 5.0 5.1 5.0 4.3 5.0  CL 100   < > 100   < > 101 103 102 105 107  CO2 27   < > 25   < > 25 25 22 23 24   GLUCOSE 126*   < > 162*   < > 174* 135* 153* 126* 76  BUN 32*   < > 34*   < > 46* 57* 75* 84* 88*  CREATININE 1.09*   < > 1.10*   < > 1.47* 1.68* 1.94* 1.92* 2.20*  CALCIUM 8.4*   < > 8.9   < > 9.0 9.4 9.1 8.5* 9.1  MG 2.5*  --  2.4  --   --  2.3  --  1.9 1.7  PHOS 2.7   < > 2.2*   < > 2.8 4.0 6.0* 5.4* 5.2*   < > = values in this interval not displayed.   GFR: Estimated Creatinine Clearance: 29.1 mL/min (A) (by C-G formula based on SCr of 2.2 mg/dL (H)). Recent Labs  Lab 09/03/18 0347 09/04/18 0430 09/05/18 0250 09/07/18 0607  WBC 8.0 11.1* 10.4 7.8    Liver Function Tests: Recent Labs  Lab 09/04/18 1542 09/04/18 1729 09/05/18 0250 09/05/18 1545 09/06/18 0422  ALBUMIN 1.1* 2.4* 2.5* 2.2* 2.1*   No results for input(s): LIPASE, AMYLASE in the last 168 hours. No results for input(s): AMMONIA in the last 168 hours.  ABG    Component Value Date/Time   PHART 7.389 09/03/2018 0427   PCO2ART 44.8 09/03/2018 0427   PO2ART 78.0 (L) 09/03/2018 0427   HCO3 27.2 09/03/2018 0427   TCO2 29 09/03/2018 0427   ACIDBASEDEF 12.0 (H) 08/31/2018 0350   O2SAT 96.0 09/03/2018 0427     Coagulation Profile: No results for input(s): INR, PROTIME in the last 168 hours.  Cardiac Enzymes: No results for input(s): CKTOTAL, CKMB, CKMBINDEX, TROPONINI in the last 168 hours.  HbA1C: No results found for: HGBA1C  CBG: Recent Labs  Lab 09/06/18 1946 09/06/18 2222 09/07/18 0008 09/07/18 0324 09/07/18 0752  GLUCAP 81 72 78 76 74   Eliseo Gum MSN, AGACNP-BC [facility] 09/07/2018, 9:39 AM  Attending Note:  58 year old female with extensive autoimmune history presenting to PCCM with respiratory failure from HCAP and septic shock with pulmonary edema.  Patient was diuresed and extubated on 3/2 and appears to have done well overnight with coarse BS on exam.  I reviewed CXR myself, infiltrate noted.  Discussed with PCCM-NP.  Respiratory failure:  - Monitor closely for airway protection  Hypoxemia:  - Titrate O2 for sat of 88-92%  Acute pulmonary edema:  - Lasix  - Strict I/O  Hypokalemia:  - Replace electrolytes as indicated  - BMET in AM  Will transfer to SDU, PT/OT, OOB to chair, continue abx.  Transfer care to [facility] with PCCM off 3/4  Patient seen and examined, agree with above note.  I dictated the care and orders written for this patient under my direction.  Rush Farmer, [place]

## 2018-09-07 NOTE — Progress Notes (Signed)
[facility] NEPHROLOGY PROGRESS NOTE  Assessment/ Plan: Pt is a 58 y.o. yo female with history of lupus on Imuran Plaquenil and prednisone admitted with sepsis due to pneumonia and ischemic ATN.  #AKI on CKD stage III in the setting of sepsis and ischemic ATN.  Patient required CVVHD until 2/29.  Patient is nonoliguric with fluctuation in serum creatinine level.  Today creatinine level went up to 2.2 likely due to poor oral intake and prerenal.  Encourage oral intake.  Monitor electrolytes and urine output.  Avoid nephrotoxins.  After discharge from hospital patient will follow with Dr. Jimmy Footman at [facility].  #Sepsis due to pneumonia in immunocompromised patient.  #Altered mental status due to sepsis and hypoxia.  Mental status seems improving  #Respiratory failure on ventilator per PCCM: Now extubated and doing well.  #Anemia: Monitor hemoglobin.  #SLE: On Plaquenil.  Imuran on hold.   Subjective: Seen and examined in ICU.  Patient is extubated, doing well.  Mental status seems improving.  Denied nausea, vomiting, chest pain or shortness of breath.. Objective Vital signs in last 24 hours: Vitals:   09/07/18 0700 09/07/18 0750 09/07/18 0800 09/07/18 0900  BP:   124/74 (!) 113/56  Pulse: 88  91 86  Resp: (!) 30  (!) 31 (!) 26  Temp:  98.5 F (36.9 C)    TempSrc:  Oral    SpO2: 93%  93% 93%  Weight:      Height:       Weight change: -3.5 kg  Intake/Output Summary (Last 24 hours) at 09/07/2018 0940 Last data filed at 09/07/2018 0800 Gross per 24 hour  Intake 425.55 ml  Output 575 ml  Net -149.45 ml       Labs: Basic Metabolic Panel: Recent Labs  Lab 09/05/18 1545 09/06/18 0422 09/07/18 0607  NA 137 139 140  K 5.0 4.3 5.0  CL 102 105 107  CO2 22 23 24   GLUCOSE 153* 126* 76  BUN 75* 84* 88*  CREATININE 1.94* 1.92* 2.20*  CALCIUM 9.1 8.5* 9.1  PHOS 6.0* 5.4* 5.2*   Liver Function Tests: Recent Labs  Lab 09/05/18 0250 09/05/18 1545 09/06/18 0422   ALBUMIN 2.5* 2.2* 2.1*   No results for input(s): LIPASE, AMYLASE in the last 168 hours. No results for input(s): AMMONIA in the last 168 hours. CBC: Recent Labs  Lab 09/01/18 0333  09/03/18 0347  09/04/18 0430 09/05/18 0250 09/07/18 0607  WBC 8.1  --  8.0  --  11.1* 10.4 7.8  HGB 10.4*   < > 11.2*   < > 11.5* 12.1 10.5*  HCT 30.4*   < > 33.9*   < > 35.9* 36.9 31.0*  MCV 96.2  --  102.7*  --  103.2* 102.2* 102.3*  PLT 179  --  183  --  237 197 193   < > = values in this interval not displayed.   Cardiac Enzymes: No results for input(s): CKTOTAL, CKMB, CKMBINDEX, TROPONINI in the last 168 hours. CBG: Recent Labs  Lab 09/06/18 1946 09/06/18 2222 09/07/18 0008 09/07/18 0324 09/07/18 0752  GLUCAP 81 72 78 76 74    Iron Studies: No results for input(s): IRON, TIBC, TRANSFERRIN, FERRITIN in the last 72 hours. Studies/Results: No results found.  Medications: Infusions: . sodium chloride Stopped (09/06/18 1101)  . dexmedetomidine (PRECEDEX) IV infusion Stopped (09/06/18 1007)  . feeding supplement (VITAL HIGH PROTEIN) Stopped (09/06/18 1200)  . fentaNYL infusion INTRAVENOUS Stopped (09/06/18 1007)  . propofol (DIPRIVAN) infusion Stopped (  09/05/18 1043)    Scheduled Medications: . chlorhexidine  15 mL Mouth Rinse BID  . Chlorhexidine Gluconate Cloth  6 each Topical Daily  . Chlorhexidine Gluconate Cloth  6 each Topical Daily  . cloNIDine  0.1 mg Per Tube BID  . escitalopram  20 mg Oral Daily  . feeding supplement (PRO-STAT SUGAR FREE 64)  30 mL Per Tube Daily  . folic acid  1 mg Oral Daily  . heparin  5,000 Units Subcutaneous Q8H  . hydrALAZINE  50 mg Oral Q8H  . hydroxychloroquine  200 mg Oral Daily  . insulin aspart  0-15 Units Subcutaneous Q4H  . levothyroxine  50 mcg Oral Q0600  . mouth rinse  15 mL Mouth Rinse q12n4p  . metoprolol tartrate  25 mg Oral BID  . midazolam  2 mg Intravenous Once  . pantoprazole (PROTONIX) IV  40 mg Intravenous QHS  .  polyethylene glycol  17 g Oral BID  . QUEtiapine  400 mg Oral QHS  . sennosides  5 mL Oral BID  . sodium chloride flush  10-40 mL Intracatheter Q12H  . vitamin B-12  1,000 mcg Oral Daily    have reviewed scheduled and prn medications.  Physical Exam: General: Extubated, calm and comfortable. Heart:RRR, s1s2 nl, no rubs Lungs: Bilateral good air entry, no wheezing or crackle Abdomen:soft, Non-tender, non-distended Extremities:No edema Neurology: Alert awake and following commands  Anna [name] Anna [name] 09/07/2018,9:40 AM  LOS: 9 days

## 2018-09-07 NOTE — Progress Notes (Signed)
OT Cancellation Note  Patient Details Name: Anna [name] MRN: [id number] DOB: 02-04-1961   Cancelled Treatment:    Reason Eval/Treat Not Completed: Other (comment). Pt being transferred to another floor. Will attempt another time.   [name], OT/L   Acute OT Clinical Specialist Acute Rehabilitation Services Pager [phone] Office [phone]  09/07/2018, 4:40 PM

## 2018-09-07 NOTE — Progress Notes (Addendum)
Modified Barium Swallow Progress Note  Patient Details  Name: Anna [name] MRN: [id number] Date of Birth: 1961-03-25  Today's Date: 09/07/2018  Modified Barium Swallow completed.  Full report located under Chart Review in the Imaging Section.  Brief recommendations include the following:  Clinical Impression  Pt presents with moderate pharyngeal dysphagia following prolonged intubation and exacerbated by cognitive status. Swallow dysfunction marked by suspected incomplete glottal closure, decreased laryngeal sensation, inconsistent timing of swallow initiation and impulsive consumption of liquids resulting in silent aspiration of thin liquids. Max verbal cues to elicit clearance of aspirate with cough resulted in partial clearance. Chin tuck did not consistently prevent aspiration. Nectar thick liquids prevented aspiration and puree/regular solid POs consumed with no aspiration or excessive residue. Pt belched intermittently throughout testing and coughed x3 after. Given prolonged intubation, subsequent decrease in laryngeal sensation and cognitive status, recommend dys 3, nectar liquid diet with full supervision to take small bites/sips and consume at a slow rate. Pt may have ice chips intermittently throughout day in betwen meals after thorough oral care. SLP to f/u to for diet tolerance and provide strategies and exercises for safe swallowing.    Swallow Evaluation Recommendations       SLP Diet Recommendations: Dysphagia 3 (Mech soft) solids;Nectar thick liquid   Liquid Administration via: Cup   Medication Administration: Whole meds with puree   Supervision: Patient able to self feed;Full supervision/cueing for compensatory strategies   Compensations: Slow rate;Small sips/bites   Postural Changes: Remain semi-upright after after feeds/meals (Comment);Seated upright at 90 degrees       Other Recommendations: Have oral suction available    Ellis Savage, Student  SLP 09/07/2018,12:39 PM

## 2018-09-07 NOTE — Progress Notes (Addendum)
  Speech Language Pathology Treatment: Dysphagia  Patient Details Name: Anna [name] MRN: [id number] DOB: 19-Aug-1960 Today's Date: 09/07/2018 Time: 1025-1040 SLP Time Calculation (min) (ACUTE ONLY): 15 min  Assessment / Plan / Recommendation Clinical Impression  Pt seen for SLP skilled tx to determine readiness and need for instrumental testing. RN reports no overt s/sx of dysphagia with ice chips, which was confirmed upon SLP observation. Spoonfuls of puree (with crushed meds provided by RN) and small cup sips of thin both consumed with no overt s/sx of aspiration or multiple swallows. Pt unable to complete 3oz water test and displayed immediate cough after 3 straw sips. Recommend MBS to determine swallow function and further goals as indicated.    HPI HPI: Pt adm with PNA and resp failure. Intubated 2/23 and extubated 3/2. PMH includes bipolar disorder, CHF, CVA, lupus, CKD, HTN.      SLP Plan  MBS;New goals to be determined pending instrumental study       Recommendations  Diet recommendations: NPO(ice chips intermittently) Medication Administration: Whole meds with puree Supervision: Full supervision/cueing for compensatory strategies Compensations: Slow rate;Small sips/bites Postural Changes and/or Swallow Maneuvers: Seated upright 90 degrees;Upright 30-60 min after meal                Oral Care Recommendations: Oral care QID;Oral care prior to ice chip/H20 Follow up Recommendations: Skilled Nursing facility SLP Visit Diagnosis: Dysphagia, unspecified (R13.10) Plan: MBS;New goals to be determined pending instrumental study       [place], Student SLP 09/07/2018, 10:47 AM

## 2018-09-07 NOTE — Care Management Note (Signed)
Case Management Note  Patient Details  Name: OLIVEAH ZWACK MRN: [id number] Date of Birth: 1961-03-07  Subjective/Objective: 58 yo female presented with pneumonia and respiratory failure. PMH: Lupus, Pulmonary hypertension, tobacco abuse, hypertension, hyperlipidemia, depression, prior CVA.                   Action/Plan: PT recommending SNF for rehab. Patient lived at home with her spouse who works. CSW to facilitate transition to SNF upon medical stability. CM team will continue to follow.   Expected Discharge Date:                  Expected Discharge Plan:  Skilled Nursing Facility  In-House Referral:  Clinical Social Work  Discharge planning Services  CM Consult  Post Acute Care Choice:  NA Choice offered to:  NA  DME Arranged:  N/A DME Agency:  NA  HH Arranged:  NA HH Agency:  NA  Status of Service:  In process, will continue to follow  If discussed at Long Length of Stay Meetings, dates discussed:    Additional Comments:  Midge Minium RN, BSN, NCM-BC, ACM-RN [phone] 09/07/2018, 12:35 PM

## 2018-09-07 NOTE — NC FL2 (Signed)
[place] LEVEL OF CARE SCREENING TOOL     IDENTIFICATION  Patient Name: Anna [name] Birthdate: Jul 17, 1960 Sex: female Admission Date (Current Location): 08/29/2018  [facility] and [place] Number:  Herbalist and Address:  [facility], [place] [address], [place], [place] [address]      Provider Number: [id number]  Attending Physician Name and Address:  Rush Farmer, MD  Relative Name and Phone Number:       Current Level of Care: Hospital Recommended Level of Care: [place] Prior Approval Number:    Date Approved/Denied:   PASRR Number: [id number]  Discharge Plan: SNF    Current Diagnoses: Patient Active Problem List   Diagnosis Date Noted  . Acute respiratory failure ([place]) 08/29/2018  . GAD (generalized anxiety disorder) 08/04/2018  . Smoker 08/04/2018  . Acute respiratory failure with hypoxemia ([place])   . OSA (obstructive sleep apnea)   . AMS (altered mental status) 07/23/2018  . Chronic diastolic CHF (congestive heart failure) ([place]) 07/23/2018  . Bipolar disease, chronic ([place]) 07/23/2018  . Elevated troponin 06/16/2018  . CKD (chronic kidney disease), stage III ([place]) 06/16/2018  . Acute renal failure superimposed on stage 3 chronic kidney disease ([place]) 06/15/2018  . 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 Weakness on right side of face 06/15/2018  . Constipation in female 02/12/2018  . Orthostatic hypotension 02/12/2018  . History of TIA (transient ischemic attack) 02/12/2018  . Intractable nausea and vomiting 02/12/2018  . Pulmonary hypertension ([place]) 08/25/2016  . Pleural effusion   . Pulmonary edema   . SIRS (systemic inflammatory response syndrome) (HCC)   . Accidental drug overdose   . Acute diastolic CHF (congestive heart failure) ([place])   . HCAP (healthcare-associated pneumonia)   . Long-term use of Plaquenil   . Anemia 08/04/2016  . Acute respiratory failure with hypoxia ([place])  08/04/2016  . Acute encephalopathy 08/04/2016  . Migraine without aura 12/09/2012  . Unspecified constipation 12/08/2012  . GERD (gastroesophageal reflux disease) 12/08/2012  . Multinodular goiter 08/20/2011  . Essential hypertension, benign 12/25/2010  . SLE (systemic lupus erythematosus) ([name]) 11/27/2010  . Depression 11/27/2010  . Hyperlipidemia 11/27/2010  . Hypothyroid 11/27/2010  . Encounter for long-term (current) use of other medications 11/27/2010    Orientation RESPIRATION BLADDER Height & Weight     Self, Time, Situation, Place  O2(Nasal Cannula 3L) Continent Weight: 173 lb 1 oz (78.5 kg) Height:  5\' 6"  (167.6 cm)  BEHAVIORAL SYMPTOMS/MOOD NEUROLOGICAL BOWEL NUTRITION STATUS      Continent Diet(DYS 3 diet, nectar thick liquids)  AMBULATORY STATUS COMMUNICATION OF NEEDS Skin   Limited Assist Verbally Normal                       Personal Care Assistance Level of Assistance  Dressing, Feeding, Bathing Bathing Assistance: Limited assistance Feeding assistance: Independent Dressing Assistance: Limited assistance     Functional Limitations Info  Sight, Hearing, Speech Sight Info: Adequate Hearing Info: Adequate Speech Info: Adequate    SPECIAL CARE FACTORS FREQUENCY  PT (By licensed PT), OT (By licensed OT)     PT Frequency: 3x OT Frequency: 3x            Contractures Contractures Info: Not present    Additional Factors Info  Code Status, Allergies Code Status Info: Full Code Allergies Info: No known allergies           Current Medications (09/07/2018):  This is  the current hospital active medication list Current Facility-Administered Medications  Medication Dose Route Frequency Provider Last Rate Last Dose  . 0.9 %  sodium chloride infusion   Intravenous PRN Rush Farmer, MD   Stopped at 09/06/18 1101  . Chlorhexidine Gluconate Cloth 2 % PADS 6 each  6 each Topical Daily Rush Farmer, MD   6 each at 09/07/18 1024  . Chlorhexidine  Gluconate Cloth 2 % PADS 6 each  6 each Topical Daily Rush Farmer, MD   6 each at 09/07/18 1024  . cloNIDine (CATAPRES) tablet 0.1 mg  0.1 mg Per Tube BID Collene Gobble, MD   0.1 mg at 09/07/18 1022  . escitalopram (LEXAPRO) tablet 20 mg  20 mg Oral Daily Elsie Lincoln, MD   20 mg at 09/07/18 1021  . feeding supplement (PRO-STAT SUGAR FREE 64) liquid 30 mL  30 mL Per Tube Daily Collene Gobble, MD   30 mL at 09/07/18 1023  . feeding supplement (VITAL HIGH PROTEIN) liquid 1,000 mL  1,000 mL Per Tube Continuous Collene Gobble, MD   Stopped at 09/06/18 1200  . fentaNYL (SUBLIMAZE) bolus via infusion 50 mcg  50 mcg Intravenous Q1H PRN Kipp Brood, MD   50 mcg at 09/05/18 2304  . folic acid (FOLVITE) tablet 1 mg  1 mg Oral Daily Erick Colace, NP   1 mg at 09/07/18 1022  . heparin injection 5,000 Units  5,000 Units Subcutaneous Q8H Erick Colace, NP   5,000 Units at 09/07/18 1339  . hydrALAZINE (APRESOLINE) tablet 50 mg  50 mg Oral Q8H Elsie Lincoln, MD   50 mg at 09/07/18 1339  . hydroxychloroquine (PLAQUENIL) tablet 200 mg  200 mg Oral Daily Erick Colace, NP   200 mg at 09/07/18 1022  . insulin aspart (novoLOG) injection 0-15 Units  0-15 Units Subcutaneous Q4H Erick Colace, NP   2 Units at 09/06/18 [phone]  . levothyroxine (SYNTHROID, LEVOTHROID) tablet 50 mcg  50 mcg Oral Q0600 Erick Colace, NP   50 mcg at 09/07/18 7062  . MEDLINE mouth rinse  15 mL Mouth Rinse BID Rush Farmer, MD      . metoprolol tartrate (LOPRESSOR) tablet 25 mg  25 mg Oral BID Rush Farmer, MD   25 mg at 09/07/18 1023  . pantoprazole (PROTONIX) injection 40 mg  40 mg Intravenous QHS Erick Colace, NP   40 mg at 09/06/18 2222  . polyethylene glycol (MIRALAX / GLYCOLAX) packet 17 g  17 g Oral BID Collene Gobble, MD   17 g at 09/06/18 0933  . predniSONE (DELTASONE) tablet 5 mg  5 mg Oral Q breakfast Bowser, Laurel Dimmer, NP   5 mg at 09/07/18 1022  . QUEtiapine (SEROQUEL) tablet 400 mg  400 mg Oral QHS  Rigoberto Noel, MD   400 mg at 09/06/18 2222  . RESOURCE THICKENUP CLEAR   Oral PRN Rush Farmer, MD      . sennosides (SENOKOT) 8.8 MG/5ML syrup 5 mL  5 mL Oral BID Elsie Lincoln, MD      . sodium chloride flush (NS) 0.9 % injection 10-40 mL  10-40 mL Intracatheter Q12H Rush Farmer, MD   10 mL at 09/07/18 1024  . sodium chloride flush (NS) 0.9 % injection 10-40 mL  10-40 mL Intracatheter PRN Rush Farmer, MD      . vitamin B-12 (CYANOCOBALAMIN) tablet 1,000 mcg  1,000 mcg Oral Daily  Erick Colace, NP   1,000 mcg at 09/07/18 1021     Discharge Medications: Please see discharge summary for a list of discharge medications.  Relevant Imaging Results:  Relevant Lab Results:   Additional Information SS#: [id number]  Eileen Stanford, LCSW

## 2018-09-08 DIAGNOSIS — N189 Chronic kidney disease, unspecified: Secondary | ICD-10-CM

## 2018-09-08 LAB — BASIC METABOLIC PANEL
Anion gap: 9 (ref 5–15)
BUN: 79 mg/dL — ABNORMAL HIGH (ref 6–20)
CO2: 24 mmol/L (ref 22–32)
CREATININE: 2.31 mg/dL — AB (ref 0.44–1.00)
Calcium: 8.7 mg/dL — ABNORMAL LOW (ref 8.9–10.3)
Chloride: 109 mmol/L (ref 98–111)
GFR calc Af Amer: 26 mL/min — ABNORMAL LOW (ref >60)
GFR calc non Af Amer: 22 mL/min — ABNORMAL LOW (ref >60)
Glucose, Bld: 93 mg/dL (ref 70–99)
Potassium: 5.3 mmol/L — ABNORMAL HIGH (ref 3.5–5.1)
Sodium: 142 mmol/L (ref 135–145)

## 2018-09-08 LAB — GLUCOSE, CAPILLARY
GLUCOSE-CAPILLARY: 137 mg/dL — AB (ref 70–99)
GLUCOSE-CAPILLARY: 125 mg/dL — AB (ref 70–99)
Glucose-Capillary: 115 mg/dL — ABNORMAL HIGH (ref 70–99)
Glucose-Capillary: 137 mg/dL — ABNORMAL HIGH (ref 70–99)
Glucose-Capillary: 92 mg/dL (ref 70–99)
Glucose-Capillary: 94 mg/dL (ref 70–99)
Glucose-Capillary: 95 mg/dL (ref 70–99)

## 2018-09-08 LAB — CBC
HCT: 28.5 % — ABNORMAL LOW (ref 36.0–46.0)
Hemoglobin: 9.8 g/dL — ABNORMAL LOW (ref 12.0–15.0)
MCH: 35.4 pg — AB (ref 26.0–34.0)
MCHC: 34.4 g/dL (ref 30.0–36.0)
MCV: 102.9 fL — ABNORMAL HIGH (ref 80.0–100.0)
Platelets: 189 10*3/uL (ref 150–400)
RBC: 2.77 MIL/uL — ABNORMAL LOW (ref 3.87–5.11)
RDW: 18.5 % — ABNORMAL HIGH (ref 11.5–15.5)
WBC: 5.6 10*3/uL (ref 4.0–10.5)
nRBC: 0 % (ref 0.0–0.2)

## 2018-09-08 MED ORDER — NEPRO/CARBSTEADY PO LIQD
237.0000 mL | Freq: Two times a day (BID) | ORAL | Status: DC
  Administered 2018-09-08 – 2018-09-09 (×2): 237 mL via ORAL

## 2018-09-08 MED ORDER — PATIROMER SORBITEX CALCIUM 8.4 G PO PACK
8.4000 g | PACK | Freq: Once | ORAL | Status: AC
  Administered 2018-09-08: 8.4 g via ORAL
  Filled 2018-09-08: qty 1

## 2018-09-08 MED ORDER — PANTOPRAZOLE SODIUM 40 MG PO TBEC
40.0000 mg | DELAYED_RELEASE_TABLET | Freq: Every day | ORAL | Status: DC
  Administered 2018-09-08 – 2018-09-11 (×4): 40 mg via ORAL
  Filled 2018-09-08 (×4): qty 1

## 2018-09-08 MED ORDER — PATIROMER SORBITEX CALCIUM 8.4 G PO PACK: 16.8000 g | PACK | Freq: Once | ORAL | Status: DC

## 2018-09-08 NOTE — Progress Notes (Signed)
[facility] NEPHROLOGY PROGRESS NOTE  Assessment/ Plan: Pt is a 58 y.o. yo female with history of lupus on Imuran Plaquenil and prednisone admitted with sepsis due to pneumonia and ischemic ATN.  #AKI on CKD stage III in the setting of sepsis and ischemic ATN.  Patient required CVVHD until 2/29.  Patient is nonoliguric with fluctuation in serum creatinine level.  -Patient is nonoliguric and serum creatinine level mildly increasing to 2.3 today.  Ordered a dose of patiromer for mild hyperkalemia.  Recommend low potassium diet.   Encourage oral intake.  Monitor electrolytes and urine output.  Avoid nephrotoxins.  After discharge from hospital patient will follow with Dr. Jimmy Footman at [facility].  #Sepsis due to pneumonia in immunocompromised patient.  #Altered mental status due to sepsis and hypoxia.  Mental status seems improving  #Respiratory failure on ventilator per PCCM: Now extubated and doing well.  #Anemia: Monitor hemoglobin.  #SLE: On Plaquenil.  Imuran on hold.   Subjective: Seen and examined.  Transferred out of ICU.  Mental status improving.  Denied nausea vomiting chest pain or shortness of breath. Objective Vital signs in last 24 hours: Vitals:   09/08/18 0632 09/08/18 0749 09/08/18 0900 09/08/18 1109  BP:  (!) 141/78  138/81  Pulse: 79 80  80  Resp:      Temp:  98.4 F (36.9 C)  98.1 F (36.7 C)  TempSrc:  Oral  Oral  SpO2: 99% 90% 96% 93%  Weight: 78.4 kg     Height:       Weight change: -0.119 kg  Intake/Output Summary (Last 24 hours) at 09/08/2018 1316 Last data filed at 09/08/2018 0920 Gross per 24 hour  Intake 220 ml  Output 900 ml  Net -680 ml       Labs: Basic Metabolic Panel: Recent Labs  Lab 09/05/18 1545 09/06/18 0422 09/07/18 0607 09/08/18 0301  NA 137 139 140 142  K 5.0 4.3 5.0 5.3*  CL 102 105 107 109  CO2 22 23 24 24   GLUCOSE 153* 126* 76 93  BUN 75* 84* 88* 79*  CREATININE 1.94* 1.92* 2.20* 2.31*  CALCIUM 9.1 8.5* 9.1  8.7*  PHOS 6.0* 5.4* 5.2*  --    Liver Function Tests: Recent Labs  Lab 09/05/18 0250 09/05/18 1545 09/06/18 0422  ALBUMIN 2.5* 2.2* 2.1*   No results for input(s): LIPASE, AMYLASE in the last 168 hours. No results for input(s): AMMONIA in the last 168 hours. CBC: Recent Labs  Lab 09/03/18 0347  09/04/18 0430 09/05/18 0250 09/07/18 0607 09/08/18 0301  WBC 8.0  --  11.1* 10.4 7.8 5.6  HGB 11.2*   < > 11.5* 12.1 10.5* 9.8*  HCT 33.9*   < > 35.9* 36.9 31.0* 28.5*  MCV 102.7*  --  103.2* 102.2* 102.3* 102.9*  PLT 183  --  237 197 193 189   < > = values in this interval not displayed.   Cardiac Enzymes: No results for input(s): CKTOTAL, CKMB, CKMBINDEX, TROPONINI in the last 168 hours. CBG: Recent Labs  Lab 09/07/18 1544 09/07/18 1636 09/07/18 2022 09/08/18 0009 09/08/18 0438  GLUCAP 95 98 94 94 92    Iron Studies: No results for input(s): IRON, TIBC, TRANSFERRIN, FERRITIN in the last 72 hours. Studies/Results: Dg Swallowing Func-speech Pathology  Result Date: 09/07/2018 Note populated for Ellis Savage, Student SLP Objective Swallowing Evaluation: Type of Study: MBS-Modified Barium Swallow Study  Patient Details Name: Anna [name] MRN: [id number] Date of Birth: 10-03-1960 Today's Date: 09/07/2018  Time: SLP Start Time (ACUTE ONLY): 1108 -SLP Stop Time (ACUTE ONLY): 1140 SLP Time Calculation (min) (ACUTE ONLY): 32 min Past Medical History: Past Medical History: Diagnosis Date . Acute CHF ([place]) 07/2016 . Depressive disorder, not elsewhere classified  . Family history of adverse reaction to anesthesia   mother passed away during surgery . Hypertension  . Hypothyroid   Dr. Wilson Singer . Kidney stone  . Lupus (systemic lupus erythematosus) ([place])  . Migraine  . Pure hypercholesterolemia  . Stroke [facility])  Past Surgical History: Past Surgical History: Procedure Laterality Date . CESAREAN SECTION   . ECTOPIC PREGNANCY SURGERY    times 2 . JOINT REPLACEMENT   . LAPAROSCOPY    with rt salpingectomy  . [place] SURGERY  2003 . TONSILLECTOMY   . TOTAL ABDOMINAL HYSTERECTOMY  2004  ovaries retained, DUB . TUBAL LIGATION  1985 HPI: Pt adm with PNA and resp failure. Intubated 2/23 and extubated 3/2. PMH includes bipolar disorder, CHF, CVA, lupus, CKD, HTN.  Subjective: pt alert, asking repetitive questions Assessment / Plan / Recommendation CHL IP CLINICAL IMPRESSIONS 09/07/2018 Clinical Impression Pt presents with moderate pharyngeal dysphagia following prolonged intubation and exacerbated by cognitive status. Swallow dysfunction marked by suspected incomplete glottal closure, decreased laryngeal sensation, inconsistent timing of swallow initiation and impulsive consumption of liquids resulting in silent aspiration of thin liquids. Max verbal cues to elicit clearance of aspirate with cough resulted in partial clearance. Chin tuck did not consistently prevent aspiration. Nectar thick liquids prevented aspiration and puree/regular solid POs consumed with no aspiration or excessive residue. Pt belched intermittently throughout testing and coughed x3 after. Given prolonged intubation, subsequent decrease in laryngeal sensation and cognitive status, recommend dys 3, nectar liquid diet with full supervision to take small bites/sips and consume at a slow rate. Pt may have ice chips intermittently throughout day in betwen meals after thorough oral care. SLP to f/u to for diet tolerance and provide strategies and exercises for safe swallowing.  SLP Visit Diagnosis Dysphagia, pharyngeal phase (R13.13) Attention and concentration deficit following -- Frontal lobe and executive function deficit following -- Impact on safety and function Moderate aspiration risk   CHL IP TREATMENT RECOMMENDATION 09/07/2018 Treatment Recommendations Therapy as outlined in treatment plan below   Prognosis 09/07/2018 Prognosis for Safe Diet Advancement Good Barriers to Reach Goals -- Barriers/Prognosis Comment -- CHL IP DIET RECOMMENDATION 09/07/2018  SLP Diet Recommendations Dysphagia 3 (Mech soft) solids;Nectar thick liquid Liquid Administration via Cup Medication Administration Whole meds with puree Compensations Slow rate;Small sips/bites Postural Changes Remain semi-upright after after feeds/meals (Comment);Seated upright at 90 degrees   CHL IP OTHER RECOMMENDATIONS 09/07/2018 Recommended Consults -- Oral Care Recommendations -- Other Recommendations Have oral suction available   CHL IP FOLLOW UP RECOMMENDATIONS 09/07/2018 Follow up Recommendations Skilled Nursing facility   [facility] IP FREQUENCY AND DURATION 09/07/2018 Speech Therapy Frequency (ACUTE ONLY) min 2x/week Treatment Duration 2 weeks      CHL IP ORAL PHASE 09/07/2018 Oral Phase WFL Oral - Pudding Teaspoon -- Oral - Pudding Cup -- Oral - Honey Teaspoon -- Oral - Honey Cup -- Oral - Nectar Teaspoon -- Oral - Nectar Cup -- Oral - Nectar Straw -- Oral - Thin Teaspoon -- Oral - Thin Cup -- Oral - Thin Straw -- Oral - Puree -- Oral - Mech Soft -- Oral - Regular -- Oral - Multi-Consistency -- Oral - Pill -- Oral Phase - Comment --  CHL IP PHARYNGEAL PHASE 09/07/2018 Pharyngeal Phase Impaired Pharyngeal- Pudding Teaspoon -- Pharyngeal --  Pharyngeal- Pudding Cup -- Pharyngeal -- Pharyngeal- Honey Teaspoon -- Pharyngeal -- Pharyngeal- Honey Cup -- Pharyngeal -- Pharyngeal- Nectar Teaspoon -- Pharyngeal -- Pharyngeal- Nectar Cup Delayed swallow initiation-vallecula;Delayed swallow initiation-pyriform sinuses;Penetration/Aspiration during swallow;Compensatory strategies attempted (with notebox) Pharyngeal Material enters airway, remains ABOVE vocal cords then ejected out Pharyngeal- Nectar Straw Delayed swallow initiation-vallecula;Delayed swallow initiation-pyriform sinuses;Penetration/Aspiration during swallow;Compensatory strategies attempted (with notebox) Pharyngeal Material enters airway, remains ABOVE vocal cords then ejected out Pharyngeal- Thin Teaspoon -- Pharyngeal -- Pharyngeal- Thin Cup Delayed swallow  initiation-vallecula;Delayed swallow initiation-pyriform sinuses;Penetration/Aspiration during swallow;Compensatory strategies attempted (with notebox) Pharyngeal Material enters airway, passes BELOW cords without attempt by patient to eject out (silent aspiration) Pharyngeal- Thin Straw Delayed swallow initiation-vallecula;Delayed swallow initiation-pyriform sinuses;Penetration/Aspiration during swallow;Compensatory strategies attempted (with notebox) Pharyngeal Material enters airway, passes BELOW cords without attempt by patient to eject out (silent aspiration);Material enters airway, CONTACTS cords and not ejected out Pharyngeal- Puree WFL Pharyngeal -- Pharyngeal- Mechanical Soft -- Pharyngeal -- Pharyngeal- Regular WFL Pharyngeal -- Pharyngeal- Multi-consistency -- Pharyngeal -- Pharyngeal- Pill -- Pharyngeal -- Pharyngeal Comment --  CHL IP CERVICAL ESOPHAGEAL PHASE 09/07/2018 Cervical Esophageal Phase (No Data) Pudding Teaspoon -- Pudding Cup -- Honey Teaspoon -- Honey Cup -- Nectar Teaspoon -- Nectar Cup -- Nectar Straw -- Thin Teaspoon -- Thin Cup -- Thin Straw -- Puree -- Mechanical Soft -- Regular -- Multi-consistency -- Pill -- Cervical Esophageal Comment -- [name] Nix 09/07/2018, 1:33 PM  Pollyann Glen, M.A. CCC-SLP Acute Rehabilitation Services Pager [phone] Office [phone]              Medications: Infusions: . sodium chloride Stopped (09/06/18 1101)  . feeding supplement (VITAL HIGH PROTEIN) Stopped (09/06/18 1200)    Scheduled Medications: . Chlorhexidine Gluconate Cloth  6 each Topical Daily  . Chlorhexidine Gluconate Cloth  6 each Topical Daily  . cloNIDine  0.1 mg Per Tube BID  . escitalopram  20 mg Oral Daily  . feeding supplement (PRO-STAT SUGAR FREE 64)  30 mL Per Tube Daily  . folic acid  1 mg Oral Daily  . heparin  5,000 Units Subcutaneous Q8H  . hydrALAZINE  50 mg Oral Q8H  . hydroxychloroquine  200 mg Oral Daily  . insulin aspart  0-15 Units Subcutaneous Q4H  .  levothyroxine  50 mcg Oral Q0600  . mouth rinse  15 mL Mouth Rinse BID  . metoprolol tartrate  25 mg Oral BID  . pantoprazole  40 mg Oral QHS  . patiromer  8.4 g Oral Once  . polyethylene glycol  17 g Oral BID  . predniSONE  5 mg Oral Q breakfast  . QUEtiapine  400 mg Oral QHS  . sennosides  5 mL Oral BID  . sodium chloride flush  10-40 mL Intracatheter Q12H  . vitamin B-12  1,000 mcg Oral Daily    have reviewed scheduled and prn medications.  Physical Exam: General: Not in distress, on oxygen via nasal cannula Heart:RRR, s1s2 nl, no rubs Lungs: Clear bilateral, no wheezing or crackle Abdomen:soft, Non-tender, non-distended Extremities: No edema Neurology: Alert awake and following commands  Dron Prasad Bhandari 09/08/2018,1:16 PM  LOS: 10 days

## 2018-09-08 NOTE — Progress Notes (Signed)
  Speech Language Pathology Treatment: Dysphagia  Patient Details Name: Anna [name] MRN: [id number] DOB: March 15, 1961 Today's Date: 09/08/2018 Time: 0175-1025 SLP Time Calculation (min) (ACUTE ONLY): 25 min  Assessment / Plan / Recommendation Clinical Impression  Pt was heavily distracted this afternoon by internal distracters, but was agreeable to performing her own oral care and trying several pieces of ice. She utilized a slow rate and consumed one piece at a time with Mod I. Although no overt signs of aspiration were observed, aspiration of thin liquids on MBS was silent. SLP reinforced the need for precautions, including oral care before intake and consumption only in between meals. SLP and RN also provided Mod A for pt to make dinner selection within her currently prescribed diet textures. Will continue to follow, with additional treatment and possible advanced trials when pt is able to better attend to task.   HPI HPI: Pt adm with PNA and resp failure. Intubated 2/23 and extubated 3/2. PMH includes bipolar disorder, CHF, CVA, lupus, CKD, HTN.      SLP Plan  Continue with current plan of care       Recommendations  Diet recommendations: Dysphagia 3 (mechanical soft);Nectar-thick liquid Liquids provided via: Cup;Straw Medication Administration: Whole meds with puree Supervision: Full supervision/cueing for compensatory strategies Compensations: Slow rate;Small sips/bites Postural Changes and/or Swallow Maneuvers: Seated upright 90 degrees;Upright 30-60 min after meal                Oral Care Recommendations: Oral care BID;Oral care prior to ice chip/H20 Follow up Recommendations: Skilled Nursing facility SLP Visit Diagnosis: Dysphagia, pharyngeal phase (R13.13) Plan: Continue with current plan of care       [place] [name] 09/08/2018, 4:28 PM  Pollyann Glen, M.A. [place] Acute Environmental education officer [phone] Office [phone]

## 2018-09-08 NOTE — Progress Notes (Signed)
PROGRESS NOTE    Anna [name]  OIN:[id number] DOB: 01/25/1961 DOA: 08/29/2018 PCP: [name], MD  Outpatient Specialists:     Brief Narrative:  58 year old female patient on Imuran, Plaquenil, and prednisone for history of lupus.  Admitted 2/23 with pneumonia, Sepsis and respiratory failure, status post intubation and is extubation on this admission.  Patient also had significant ischemic ATN requiring CVVH, but now off of slow dialysis.  Encephalopathy was documented on presentation.  Patient seems to be improving, but remains poor historian.  Input from pulmonary and nephrology is highly appreciated.    Assessment & Plan:   Active Problems:   Acute respiratory failure (HCC)  Acute hypoxic respiratory failure in the setting of pneumonia.  She is immunocompromised on Imuran and prednisone -s/p course of levofloxacin ended 3/1  (completed course of antibiotics) -Status post intubation and extubation. -Titrate O2 to maintain oxygen saturation of 88-92% -Not currently on home imuran  Severe sepsis secondary to pneumonia, improved -Completed course of abx completed  -Sepsis physiology has resolved.  Hypertension -Continue to optimize.  Encephalopathy, acute metabolic  -Seems to have resolved significantly.  -Likely multifactorial.  -Continue to monitor closely.   Acute on chronic renal failure stage III;  -baseline Cr approx 1.9 -ischemic ATN -Appreciate nephrology recs  -Off CVVHD since 2/29 -Nephrology team is managing. -Control is closely, especially potassium. -Prior episodes of elevated potassium noted.  Macrocytic anemia anemia: -1 month ago hemoglobin 7.9 -Continue to monitor closely. -Anemia is likely multifactorial.  Hilar lung mass Pulmonary team is managing.   Pulmonary plans outpatient PET scan, Followed by Dr. [name].    Nicotine dependence Smoking cessation efforts  History of lupus Imuran on hold Continue Plaquenil., Plaquenil  ordered Resuming home steroids   DVT prophylaxis: Subcutaneous heparin. Code Status: Full code Family Communication:  Disposition Plan: Skilled nursing facility.   Consultants:   Nephrology  Procedures:   CVVH  Antimicrobials:   None   Subjective: Poor historian No fever chills Shortness of breath has improved significantly No cough.  Objective: Vitals:   09/08/18 0749 09/08/18 0900 09/08/18 1109 09/08/18 1713  BP: (!) 141/78  138/81 140/68  Pulse: 80  80 78  Resp:      Temp: 98.4 F (36.9 C)  98.1 F (36.7 C) 98 F (36.7 C)  TempSrc: Oral  Oral Oral  SpO2: 90% 96% 93% 97%  Weight:      Height:        Intake/Output Summary (Last 24 hours) at 09/08/2018 1803 Last data filed at 09/08/2018 1717 Gross per 24 hour  Intake 370 ml  Output 900 ml  Net -530 ml   Filed Weights   09/06/18 0500 09/07/18 0600 09/08/18 0632  Weight: 82 kg 78.5 kg 78.4 kg    Examination:  General exam: Appears calm and comfortable  Respiratory system: Decreased air entry. Cardiovascular system: S1 & S2  Gastrointestinal system: Abdomen is nondistended, soft and nontender. No organomegaly or masses felt. Normal bowel sounds heard. Central nervous system: Awake and alert.  Patient moves all limbs.   Extremities: Leg edema.  Data Reviewed: I have personally reviewed following labs and imaging studies  CBC: Recent Labs  Lab 09/03/18 0347 09/03/18 0427 09/04/18 0430 09/05/18 0250 09/07/18 0607 09/08/18 0301  WBC 8.0  --  11.1* 10.4 7.8 5.6  HGB 11.2* 10.5* 11.5* 12.1 10.5* 9.8*  HCT 33.9* 31.0* 35.9* 36.9 31.0* 28.5*  MCV 102.7*  --  103.2* 102.2* 102.3* 102.9*  PLT 183  --  237 197 193 814   Basic Metabolic Panel: Recent Labs  Lab 09/03/18 0347  09/04/18 0430  09/04/18 1729 09/05/18 0250 09/05/18 1545 09/06/18 0422 09/07/18 0607 09/08/18 0301  NA 136   < > 135   < > 137 137 137 139 140 142  K 4.6   < > 4.9   < > 5.0 5.1 5.0 4.3 5.0 5.3*  CL 100   < > 100   < >  101 103 102 105 107 109  CO2 27   < > 25   < > 25 25 22 23 24 24   GLUCOSE 126*   < > 162*   < > 174* 135* 153* 126* 76 93  BUN 32*   < > 34*   < > 46* 57* 75* 84* 88* 79*  CREATININE 1.09*   < > 1.10*   < > 1.47* 1.68* 1.94* 1.92* 2.20* 2.31*  CALCIUM 8.4*   < > 8.9   < > 9.0 9.4 9.1 8.5* 9.1 8.7*  MG 2.5*  --  2.4  --   --  2.3  --  1.9 1.7  --   PHOS 2.7   < > 2.2*   < > 2.8 4.0 6.0* 5.4* 5.2*  --    < > = values in this interval not displayed.   GFR: Estimated Creatinine Clearance: 27.7 mL/min (A) (by C-G formula based on SCr of 2.31 mg/dL (H)). Liver Function Tests: Recent Labs  Lab 09/04/18 1542 09/04/18 1729 09/05/18 0250 09/05/18 1545 09/06/18 0422  ALBUMIN 1.1* 2.4* 2.5* 2.2* 2.1*   No results for input(s): LIPASE, AMYLASE in the last 168 hours. No results for input(s): AMMONIA in the last 168 hours. Coagulation Profile: No results for input(s): INR, PROTIME in the last 168 hours. Cardiac Enzymes: No results for input(s): CKTOTAL, CKMB, CKMBINDEX, TROPONINI in the last 168 hours. BNP (last 3 results) No results for input(s): PROBNP in the last 8760 hours. HbA1C: No results for input(s): HGBA1C in the last 72 hours. CBG: Recent Labs  Lab 09/07/18 2022 09/08/18 0009 09/08/18 0438 09/08/18 1113 09/08/18 1714  GLUCAP 94 94 92 137* 125*   Lipid Profile: Recent Labs    09/07/18 1429  TRIG 82   Thyroid Function Tests: No results for input(s): TSH, T4TOTAL, FREET4, T3FREE, THYROIDAB in the last 72 hours. Anemia Panel: No results for input(s): VITAMINB12, FOLATE, FERRITIN, TIBC, IRON, RETICCTPCT in the last 72 hours. Urine analysis:    Component Value Date/Time   COLORURINE YELLOW 08/30/2018 1426   APPEARANCEUR CLEAR 08/30/2018 1426   LABSPEC >1.030 (H) 08/30/2018 1426   PHURINE 5.5 08/30/2018 1426   GLUCOSEU NEGATIVE 08/30/2018 1426   HGBUR TRACE (A) 08/30/2018 1426   BILIRUBINUR SMALL (A) 08/30/2018 1426   BILIRUBINUR n 07/20/2013 0948   KETONESUR  NEGATIVE 08/30/2018 1426   PROTEINUR 100 (A) 08/30/2018 1426   UROBILINOGEN 0.2 05/11/2015 2201   NITRITE NEGATIVE 08/30/2018 1426   LEUKOCYTESUR NEGATIVE 08/30/2018 1426   Sepsis Labs: @LABRCNTIP (procalcitonin:4,lacticidven:4)  ) Recent Results (from the past 240 hour(s))  Respiratory Panel by PCR     Status: None   Collection Time: 08/30/18  8:23 AM  Result Value Ref Range Status   Adenovirus NOT DETECTED NOT DETECTED Final   Coronavirus 229E NOT DETECTED NOT DETECTED Final    Comment: (NOTE) The Coronavirus on the Respiratory Panel, DOES NOT test for the novel  Coronavirus (2019 nCoV)    Coronavirus HKU1 NOT DETECTED NOT DETECTED Final  Coronavirus NL63 NOT DETECTED NOT DETECTED Final   Coronavirus OC43 NOT DETECTED NOT DETECTED Final   Metapneumovirus NOT DETECTED NOT DETECTED Final   Rhinovirus / Enterovirus NOT DETECTED NOT DETECTED Final   Influenza A NOT DETECTED NOT DETECTED Final   Influenza B NOT DETECTED NOT DETECTED Final   Parainfluenza Virus 1 NOT DETECTED NOT DETECTED Final   Parainfluenza Virus 2 NOT DETECTED NOT DETECTED Final   Parainfluenza Virus 3 NOT DETECTED NOT DETECTED Final   Parainfluenza Virus 4 NOT DETECTED NOT DETECTED Final   Respiratory Syncytial Virus NOT DETECTED NOT DETECTED Final   Bordetella pertussis NOT DETECTED NOT DETECTED Final   Chlamydophila pneumoniae NOT DETECTED NOT DETECTED Final   Mycoplasma pneumoniae NOT DETECTED NOT DETECTED Final    Comment: Performed at [facility], [place] [address] [place], [place] [address]         Radiology Studies: Dg Swallowing Func-speech Pathology  Result Date: 09/07/2018 Note populated for Ellis Savage, Student SLP Objective Swallowing Evaluation: Type of Study: MBS-Modified Barium Swallow Study  Patient Details Name: KATHLEE BARNHARDT MRN: [id number] Date of Birth: 1961-03-26 Today's Date: 09/07/2018 Time: SLP Start Time (ACUTE ONLY): 1108 -SLP Stop Time (ACUTE ONLY): 1140 SLP Time Calculation  (min) (ACUTE ONLY): 32 min Past Medical History: Past Medical History: Diagnosis Date . Acute CHF ([place]) 07/2016 . Depressive disorder, not elsewhere classified  . Family history of adverse reaction to anesthesia   mother passed away during surgery . Hypertension  . Hypothyroid   Dr. Wilson Singer . Kidney stone  . Lupus (systemic lupus erythematosus) ([place])  . Migraine  . Pure hypercholesterolemia  . Stroke [facility])  Past Surgical History: Past Surgical History: Procedure Laterality Date . CESAREAN SECTION   . ECTOPIC PREGNANCY SURGERY    times 2 . JOINT REPLACEMENT   . LAPAROSCOPY    with rt salpingectomy . [place] [facility]GERY  2003 . TONSILLECTOMY   . TOTAL ABDOMINAL HYSTERECTOMY  2004  ovaries retained, DUB . TUBAL LIGATION  1985 HPI: Pt adm with PNA and resp failure. Intubated 2/23 and extubated 3/2. PMH includes bipolar disorder, CHF, CVA, lupus, CKD, HTN.  Subjective: pt alert, asking repetitive questions Assessment / Plan / Recommendation CHL IP CLINICAL IMPRESSIONS 09/07/2018 Clinical Impression Pt presents with moderate pharyngeal dysphagia following prolonged intubation and exacerbated by cognitive status. Swallow dysfunction marked by suspected incomplete glottal closure, decreased laryngeal sensation, inconsistent timing of swallow initiation and impulsive consumption of liquids resulting in silent aspiration of thin liquids. Max verbal cues to elicit clearance of aspirate with cough resulted in partial clearance. Chin tuck did not consistently prevent aspiration. Nectar thick liquids prevented aspiration and puree/regular solid POs consumed with no aspiration or excessive residue. Pt belched intermittently throughout testing and coughed x3 after. Given prolonged intubation, subsequent decrease in laryngeal sensation and cognitive status, recommend dys 3, nectar liquid diet with full supervision to take small bites/sips and consume at a slow rate. Pt may have ice chips intermittently throughout day in betwen meals  after thorough oral care. SLP to f/u to for diet tolerance and provide strategies and exercises for safe swallowing.  SLP Visit Diagnosis Dysphagia, pharyngeal phase (R13.13) Attention and concentration deficit following -- Frontal lobe and executive function deficit following -- Impact on safety and function Moderate aspiration risk   CHL IP TREATMENT RECOMMENDATION 09/07/2018 Treatment Recommendations Therapy as outlined in treatment plan below   Prognosis 09/07/2018 Prognosis for Safe Diet Advancement Good Barriers to Reach Goals -- Barriers/Prognosis Comment --  CHL IP DIET RECOMMENDATION 09/07/2018 SLP Diet Recommendations Dysphagia 3 (Mech soft) solids;Nectar thick liquid Liquid Administration via Cup Medication Administration Whole meds with puree Compensations Slow rate;Small sips/bites Postural Changes Remain semi-upright after after feeds/meals (Comment);Seated upright at 90 degrees   CHL IP OTHER RECOMMENDATIONS 09/07/2018 Recommended Consults -- Oral Care Recommendations -- Other Recommendations Have oral suction available   CHL IP FOLLOW UP RECOMMENDATIONS 09/07/2018 Follow up Recommendations Skilled Nursing facility   [facility] IP FREQUENCY AND DURATION 09/07/2018 Speech Therapy Frequency (ACUTE ONLY) min 2x/week Treatment Duration 2 weeks      CHL IP ORAL PHASE 09/07/2018 Oral Phase WFL Oral - Pudding Teaspoon -- Oral - Pudding Cup -- Oral - Honey Teaspoon -- Oral - Honey Cup -- Oral - Nectar Teaspoon -- Oral - Nectar Cup -- Oral - Nectar Straw -- Oral - Thin Teaspoon -- Oral - Thin Cup -- Oral - Thin Straw -- Oral - Puree -- Oral - Mech Soft -- Oral - Regular -- Oral - Multi-Consistency -- Oral - Pill -- Oral Phase - Comment --  CHL IP PHARYNGEAL PHASE 09/07/2018 Pharyngeal Phase Impaired Pharyngeal- Pudding Teaspoon -- Pharyngeal -- Pharyngeal- Pudding Cup -- Pharyngeal -- Pharyngeal- Honey Teaspoon -- Pharyngeal -- Pharyngeal- Honey Cup -- Pharyngeal -- Pharyngeal- Nectar Teaspoon -- Pharyngeal -- Pharyngeal- Nectar Cup  Delayed swallow initiation-vallecula;Delayed swallow initiation-pyriform sinuses;Penetration/Aspiration during swallow;Compensatory strategies attempted (with notebox) Pharyngeal Material enters airway, remains ABOVE vocal cords then ejected out Pharyngeal- Nectar Straw Delayed swallow initiation-vallecula;Delayed swallow initiation-pyriform sinuses;Penetration/Aspiration during swallow;Compensatory strategies attempted (with notebox) Pharyngeal Material enters airway, remains ABOVE vocal cords then ejected out Pharyngeal- Thin Teaspoon -- Pharyngeal -- Pharyngeal- Thin Cup Delayed swallow initiation-vallecula;Delayed swallow initiation-pyriform sinuses;Penetration/Aspiration during swallow;Compensatory strategies attempted (with notebox) Pharyngeal Material enters airway, passes BELOW cords without attempt by patient to eject out (silent aspiration) Pharyngeal- Thin Straw Delayed swallow initiation-vallecula;Delayed swallow initiation-pyriform sinuses;Penetration/Aspiration during swallow;Compensatory strategies attempted (with notebox) Pharyngeal Material enters airway, passes BELOW cords without attempt by patient to eject out (silent aspiration);Material enters airway, CONTACTS cords and not ejected out Pharyngeal- Puree WFL Pharyngeal -- Pharyngeal- Mechanical Soft -- Pharyngeal -- Pharyngeal- Regular WFL Pharyngeal -- Pharyngeal- Multi-consistency -- Pharyngeal -- Pharyngeal- Pill -- Pharyngeal -- Pharyngeal Comment --  CHL IP CERVICAL ESOPHAGEAL PHASE 09/07/2018 Cervical Esophageal Phase (No Data) Pudding Teaspoon -- Pudding Cup -- Honey Teaspoon -- Honey Cup -- Nectar Teaspoon -- Nectar Cup -- Nectar Straw -- Thin Teaspoon -- Thin Cup -- Thin Straw -- Puree -- Mechanical Soft -- Regular -- Multi-consistency -- Pill -- Cervical Esophageal Comment -- [name] Nix 09/07/2018, 1:33 PM  Pollyann Glen, M.A. CCC-SLP Acute Rehabilitation Services Pager [phone] Office [phone]                  Scheduled  Meds: . Chlorhexidine Gluconate Cloth  6 each Topical Daily  . Chlorhexidine Gluconate Cloth  6 each Topical Daily  . cloNIDine  0.1 mg Per Tube BID  . escitalopram  20 mg Oral Daily  . feeding supplement (NEPRO CARB STEADY)  237 mL Oral BID BM  . folic acid  1 mg Oral Daily  . heparin  5,000 Units Subcutaneous Q8H  . hydrALAZINE  50 mg Oral Q8H  . hydroxychloroquine  200 mg Oral Daily  . insulin aspart  0-15 Units Subcutaneous Q4H  . levothyroxine  50 mcg Oral Q0600  . mouth rinse  15 mL Mouth Rinse BID  . metoprolol tartrate  25 mg Oral BID  . pantoprazole  40 mg Oral  QHS  . patiromer  8.4 g Oral Once  . polyethylene glycol  17 g Oral BID  . predniSONE  5 mg Oral Q breakfast  . QUEtiapine  400 mg Oral QHS  . sennosides  5 mL Oral BID  . sodium chloride flush  10-40 mL Intracatheter Q12H  . vitamin B-12  1,000 mcg Oral Daily   Continuous Infusions: . sodium chloride Stopped (09/06/18 1101)     LOS: 10 days    Time spent: 35 minutes.   Dana Allan, MD  [facility] Pager #: [phone] 7PM-7AM contact night coverage as above

## 2018-09-08 NOTE — Progress Notes (Signed)
Physical Therapy Treatment Patient Details Name: Anna [name] MRN: [id number] DOB: May 26, 1961 Today's Date: 09/08/2018    History of Present Illness  Pt adm with PNA and resp failure. Intubated 2/23 and extubated 3/2. PMH includes bipolar disorder, CHF, CVA, lupus, CKD, HTN.    PT Comments    Pt making progress with mobility. Continue to recommend ST-SNF. Pt continues to require assist for mobility. Pt doesn't have 24 hour assist at home as husband works.    Follow Up Recommendations  SNF;Supervision/Assistance - 24 hour     Equipment Recommendations  None recommended by PT    Recommendations for Other Services       Precautions / Restrictions Precautions Precautions: Fall Restrictions Weight Bearing Restrictions: No    Mobility  Bed Mobility Overal bed mobility: Needs Assistance Bed Mobility: Supine to Sit;Sit to Supine     Supine to sit: Min guard;HOB elevated Sit to supine: Min guard   General bed mobility comments: Pt up in chair  Transfers Overall transfer level: Needs assistance Equipment used: Rolling walker (2 wheeled);2 person hand held assist Transfers: Sit to/from Stand Sit to Stand: Min assist Stand pivot transfers: Min assist       General transfer comment: Assist to bring hips up and for balance. Verbal cues for hand placement  Ambulation/Gait Ambulation/Gait assistance: Min assist Gait Distance (Feet): 150 Feet Assistive device: Rolling walker (2 wheeled) Gait Pattern/deviations: Step-through pattern;Decreased step length - right;Decreased step length - left;Trunk flexed Gait velocity: decr Gait velocity interpretation: <1.8 ft/sec, indicate of risk for recurrent falls General Gait Details: Assist for balance and support. Verbal cues to stand more erect. Amb on 2L of O2 with SpO2 97% after amb   Stairs             Wheelchair Mobility    Modified Rankin (Stroke Patients Only)       Balance Overall balance assessment: Needs  assistance Sitting-balance support: No upper extremity supported;Feet supported Sitting balance-Leahy Scale: Fair     Standing balance support: Bilateral upper extremity supported;During functional activity Standing balance-Leahy Scale: Poor Standing balance comment: walker and min guard for static standing                            Cognition Arousal/Alertness: Awake/alert Behavior During Therapy: WFL for tasks assessed/performed Overall Cognitive Status: Impaired/Different from baseline Area of Impairment: Attention;Memory;Safety/judgement;Awareness;Problem solving                   Current Attention Level: Selective Memory: Decreased short-term memory   Safety/Judgement: Decreased awareness of safety;Decreased awareness of deficits   Problem Solving: Requires verbal cues;Requires tactile cues;Slow processing;Difficulty sequencing        Exercises      General Comments        Pertinent Vitals/Pain Pain Assessment: No/denies pain    Home Living Family/patient expects to be discharged to:: Private residence Living Arrangements: Spouse/significant other Available Help at Discharge: Family;Available PRN/intermittently Type of Home: House Home Access: Stairs to enter   Home Layout: One level;Able to live on main level with bedroom/bathroom Home Equipment: Gilford Rile - 2 wheels;Cane - single point Additional Comments: husband works    Prior Function Level of Independence: Needs Product/process development scientist / Transfers Assistance Needed: amb with rolling walker. History of falls. Only home 1 week from SNF prior to admission ADL's / Homemaking Assistance Needed: Pt reports independent wtih ADLs or her g/fs come over and help as needed. Comments:  Pt's spouse works sometimes starting at 2 am.   PT Goals (current goals can now be found in the care plan section) Acute Rehab PT Goals Patient Stated Goal: go home Progress towards PT goals: Progressing toward goals     Frequency    Min 3X/week      PT Plan Current plan remains appropriate    Co-evaluation              AM-PAC PT "6 Clicks" Mobility   Outcome Measure  Help needed turning from your back to your side while in a flat bed without using bedrails?: A Little Help needed moving from lying on your back to sitting on the side of a flat bed without using bedrails?: A Little Help needed moving to and from a bed to a chair (including a wheelchair)?: A Little Help needed standing up from a chair using your arms (e.g., wheelchair or bedside chair)?: A Little Help needed to walk in hospital room?: A Little Help needed climbing 3-5 steps with a railing? : A Lot 6 Click Score: 17    End of Session Equipment Utilized During Treatment: Gait belt;Oxygen Activity Tolerance: Patient tolerated treatment well Patient left: in chair;with call bell/phone within reach Nurse Communication: Mobility status PT Visit Diagnosis: Unsteadiness on feet (R26.81);Other abnormalities of gait and mobility (R26.89);Muscle weakness (generalized) (M62.81);History of falling (Z91.81)     Time: 1840-3754 PT Time Calculation (min) (ACUTE ONLY): 13 min  Charges:  $Gait Training: 8-22 mins                     [facility] Pager [phone] Office [place] 09/08/2018, 5:00 PM

## 2018-09-08 NOTE — Evaluation (Signed)
Occupational Therapy Evaluation Patient Details Name: Anna [name] MRN: [id number] DOB: [birth year] Today's Date: 09/08/2018    History of Present Illness  Pt adm with PNA and resp failure. Intubated 2/23 and extubated 3/2. PMH includes bipolar disorder, CHF, CVA, lupus, CKD, HTN.   Clinical Impression   Pt with decline in function and safety with ADLs and ADL mobility with decreased strength, balance and cognition;  Pt demos poor safety awareness and poor awareness of deficits. Pt's husband works and cannot provide care. Pt believes that she can go home alone and that [facility] therapies will be adequate. Educated pt on assist required at her current level of care and safety. Pt currently requires mod A with SPTs, mod A with LB ADLs and has cognitive impairments. Pt would benefit from acute OT services to address impairments to maximize level of function and safety    Follow Up Recommendations  SNF    Equipment Recommendations  Other (comment)(TBD at next level of care)    Recommendations for Other Services       Precautions / Restrictions Precautions Precautions: Fall Restrictions Weight Bearing Restrictions: No      Mobility Bed Mobility Overal bed mobility: Needs Assistance Bed Mobility: Supine to Sit;Sit to Supine     Supine to sit: Min guard;HOB elevated Sit to supine: Min guard      Transfers Overall transfer level: Needs assistance Equipment used: Rolling walker (2 wheeled);2 person hand held assist Transfers: Sit to/from [facility] Sit to Stand: Mod assist Stand pivot transfers: Min assist       General transfer comment: multimodal cues for correct hand placement, sequencing and technique    Balance Overall balance assessment: Needs assistance Sitting-balance support: No upper extremity supported;Feet supported Sitting balance-Leahy Scale: Fair     Standing balance support: Bilateral upper extremity supported;During functional  activity Standing balance-Leahy Scale: Poor                             ADL either performed or assessed with clinical judgement   ADL Overall ADL's : Needs assistance/impaired Eating/Feeding: Sitting;Set up   Grooming: Wash/dry hands;Wash/dry face;Min guard;Sitting   Upper Body Bathing: Min guard;Sitting   Lower Body Bathing: Moderate assistance   Upper Body Dressing : Min guard;Sitting   Lower Body Dressing: Maximal assistance   Toilet Transfer: Moderate assistance;Stand-pivot;BSC;Cueing for safety;Cueing for sequencing   Toileting- Clothing Manipulation and Hygiene: Moderate assistance;Sit to/from stand       Functional mobility during ADLs: Moderate assistance;Cueing for safety;Cueing for sequencing       Vision Patient Visual Report: No change from baseline       Perception     Praxis      Pertinent Vitals/Pain Pain Assessment: No/denies pain     Hand Dominance Right   Extremity/Trunk Assessment Upper Extremity Assessment Upper Extremity Assessment: Generalized weakness   Lower Extremity Assessment Lower Extremity Assessment: Defer to PT evaluation       Communication Communication Communication: No difficulties   Cognition Arousal/Alertness: Awake/alert Behavior During Therapy: WFL for tasks assessed/performed Overall Cognitive Status: Impaired/Different from baseline Area of Impairment: Attention;Memory;Safety/judgement;Awareness;Problem solving                   Current Attention Level: Selective Memory: Decreased short-term memory   Safety/Judgement: Decreased awareness of safety;Decreased awareness of deficits   Problem Solving: Requires verbal cues;Requires tactile cues;Slow processing;Difficulty sequencing     General Comments  Exercises     Shoulder Instructions      Home Living Family/patient expects to be discharged to:: Private residence Living Arrangements: Spouse/significant other Available Help  at Discharge: Family;Available PRN/intermittently Type of Home: House Home Access: Stairs to enter Entrance Stairs-Number of Steps: 1   Home Layout: One level;Able to live on main level with bedroom/bathroom     Bathroom Shower/Tub: Walk-in shower   Bathroom Toilet: Handicapped height     Home Equipment: Environmental consultant - 2 wheels;Cane - single point   Additional Comments: husband works      Prior Functioning/Environment Level of Independence: Needs assistance  Gait / Transfers Assistance Needed: amb with rolling walker. History of falls. Only home 1 week from SNF prior to admission ADL's / Homemaking Assistance Needed: Pt reports independent wtih ADLs or her g/fs come over and help as needed.   Comments: Pt's spouse works sometimes starting at 2 am.        OT Problem List: Decreased strength;Decreased activity tolerance;Decreased cognition;Decreased knowledge of use of DME or AE;Impaired balance (sitting and/or standing);Decreased coordination;Decreased safety awareness;Decreased knowledge of precautions      OT Treatment/Interventions: Self-care/ADL training;Therapeutic exercise;DME and/or AE instruction;Therapeutic activities;Patient/family education    OT Goals(Current goals can be found in the care plan section) Acute Rehab OT Goals Patient Stated Goal: go home OT Goal Formulation: With patient Time For Goal Achievement: 09/22/18 Potential to Achieve Goals: Good ADL Goals Pt Will Perform Grooming: with set-up;with supervision;sitting Pt Will Perform Upper Body Bathing: with set-up;with supervision;sitting Pt Will Perform Lower Body Bathing: with min assist;sitting/lateral leans Pt Will Perform Upper Body Dressing: with supervision;with set-up;sitting Pt Will Transfer to Toilet: with min assist;with min guard assist;stand pivot transfer;ambulating;bedside commode;regular height toilet;grab bars Pt Will Perform Toileting - Clothing Manipulation and hygiene: with min  assist;sitting/lateral leans;sit to/from stand  OT Frequency: Min 2X/week   Barriers to D/C: Decreased caregiver support          Co-evaluation              AM-PAC OT "6 Clicks" Daily Activity     Outcome Measure Help from another person eating meals?: None Help from another person taking care of personal grooming?: A Little Help from another person toileting, which includes using toliet, bedpan, or urinal?: A Lot Help from another person bathing (including washing, rinsing, drying)?: A Lot Help from another person to put on and taking off regular upper body clothing?: A Little Help from another person to put on and taking off regular lower body clothing?: A Lot 6 Click Score: 16   End of Session Equipment Utilized During Treatment: Gait belt;Other (comment)(BSC)  Activity Tolerance: Patient limited by fatigue Patient left: in chair;with call bell/phone within reach;with nursing/sitter in room  OT Visit Diagnosis: Unsteadiness on feet (R26.81);Other abnormalities of gait and mobility (R26.89);Muscle weakness (generalized) (M62.81);History of falling (Z91.81);Other symptoms and signs involving cognitive function                Time: 8841-6606 OT Time Calculation (min): 35 min Charges:  OT General Charges $OT Visit: 1 Visit OT Evaluation $OT Eval Moderate Complexity: 1 Mod OT Treatments $Self Care/Home Management : 8-22 mins    Britt Bottom 09/08/2018, 1:14 PM

## 2018-09-08 NOTE — Progress Notes (Signed)
Nutrition Follow Up  DOCUMENTATION CODES:   Not applicable  INTERVENTION:    Nepro Shake po BID, each supplement provides 425 kcal and 19 grams protein  NUTRITION DIAGNOSIS:   Inadequate oral intake now related to acute illness, decreased appetite as evidenced by meal completion < 50%, ongoing  GOAL:   Patient will meet greater than or equal to 90% of their needs, unmet  MONITOR:   PO intake, Supplement acceptance, Labs, Skin, Weight trends, I & O's  ASSESSMENT:   58 yo female, admitted with acute respiratory failure and PNA. PMH significant for lupus, acute CHF, HTN, hypothyroid, h/o stroke, recent hospitalization in January for acute encephalopathy and AKI.  3/02 extubated  3/03 s/p MBSS; diet advanced to Dys 3-nectar thick liquids 3/03 transferred from 2H-ICU to 4E-CV-SURG  Pt currently working with Speech Path at time of visit. PO intake has been poor at 25% per flowsheet records. AKI on CKD Stage III; required CVVHD until 2/29.  Pt would benefit from addition of nutrition supplements. Medications include Protonix, Vit B12 & folvite. Labs reviewed. K 5.3 (H). P 5.2 (H).  CBG's [id number].  Diet Order:   Diet Order            DIET DYS 3 Room service appropriate? Yes with Assist; Fluid consistency: Nectar Thick  Diet effective now             EDUCATION NEEDS:   Not appropriate for education at this time  Skin:  Skin Assessment: Reviewed RN Assessment  Last BM:  3/4   Intake/Output Summary (Last 24 hours) at 09/08/2018 1551 Last data filed at 09/08/2018 0920 Gross per 24 hour  Intake 220 ml  Output 900 ml  Net -680 ml   Height:  Ht Readings from Last 1 Encounters:  08/29/18 5\' 6"  (1.676 m)   Wt Readings from Last 3 Encounters:  09/08/18 78.4 kg  08/24/18 78.4 kg  07/24/18 80 kg   Ideal Body Weight:  59.1 kg  BMI:  Body mass index is 27.89 kg/m.   Estimated Nutritional Needs:   Kcal:  1800-2000  Protein:  90-105 gm  Fluid:  per  MD  Arthur Holms, RD, LDN Pager #: [phone] After-Hours Pager #: [phone]

## 2018-09-08 NOTE — Progress Notes (Signed)
Pt started de-sating to 88% when sleeping/ pt has been on 5l o2 via [place] since 2330 last night, 02 sat 92-97%. Reinforced to deep breath multiple times throughout the night. Will continue to monitor.

## 2018-09-09 DIAGNOSIS — R590 Localized enlarged lymph nodes: Secondary | ICD-10-CM

## 2018-09-09 DIAGNOSIS — J13 Pneumonia due to Streptococcus pneumoniae: Secondary | ICD-10-CM

## 2018-09-09 DIAGNOSIS — M329 Systemic lupus erythematosus, unspecified: Secondary | ICD-10-CM

## 2018-09-09 LAB — RENAL FUNCTION PANEL
Albumin: 2.4 g/dL — ABNORMAL LOW (ref 3.5–5.0)
Anion gap: 11 (ref 5–15)
BUN: 69 mg/dL — ABNORMAL HIGH (ref 6–20)
CHLORIDE: 109 mmol/L (ref 98–111)
CO2: 23 mmol/L (ref 22–32)
Calcium: 8.9 mg/dL (ref 8.9–10.3)
Creatinine, Ser: 2.59 mg/dL — ABNORMAL HIGH (ref 0.44–1.00)
GFR calc Af Amer: 23 mL/min — ABNORMAL LOW (ref >60)
GFR calc non Af Amer: 19 mL/min — ABNORMAL LOW (ref >60)
Glucose, Bld: 76 mg/dL (ref 70–99)
POTASSIUM: 4.8 mmol/L (ref 3.5–5.1)
Phosphorus: 5 mg/dL — ABNORMAL HIGH (ref 2.5–4.6)
Sodium: 143 mmol/L (ref 135–145)

## 2018-09-09 LAB — GLUCOSE, CAPILLARY
GLUCOSE-CAPILLARY: 121 mg/dL — AB (ref 70–99)
Glucose-Capillary: 80 mg/dL (ref 70–99)
Glucose-Capillary: 95 mg/dL (ref 70–99)
Glucose-Capillary: 125 mg/dL — ABNORMAL HIGH (ref 70–99)
Glucose-Capillary: 82 mg/dL (ref 70–99)

## 2018-09-09 MED ORDER — IPRATROPIUM-ALBUTEROL 0.5-2.5 (3) MG/3ML IN SOLN: 3.0000 mL | Freq: Four times a day (QID) | RESPIRATORY_TRACT | Status: DC | PRN

## 2018-09-09 MED ORDER — DIAZEPAM 5 MG PO TABS
5.0000 mg | ORAL_TABLET | Freq: Two times a day (BID) | ORAL | Status: DC | PRN
  Administered 2018-09-09 – 2018-09-10 (×2): 5 mg via ORAL
  Filled 2018-09-09 (×2): qty 1

## 2018-09-09 MED ORDER — ALBUTEROL SULFATE (2.5 MG/3ML) 0.083% IN NEBU: 2.5000 mg | INHALATION_SOLUTION | Freq: Four times a day (QID) | RESPIRATORY_TRACT | Status: DC | PRN

## 2018-09-09 MED ORDER — SODIUM CHLORIDE 0.9 % IV SOLN
INTRAVENOUS | Status: DC
  Administered 2018-09-09 – 2018-09-10 (×2): via INTRAVENOUS

## 2018-09-09 NOTE — Progress Notes (Signed)
  Speech Language Pathology Treatment:    Patient Details Name: Anna [name] MRN: [id number] DOB: 1961-02-02 Today's Date: 09/09/2018 Time: 6295-2841 SLP Time Calculation (min) (ACUTE ONLY): 17 min  Assessment / Plan / Recommendation Clinical Impression  Pt was seen for dysphagia treatment. Pt reported that she has been tolerating the meals well and Sharyn Lull, RN indicated that she tolerated her medications whole with liquids. Pt expressed multiple times during the session that she typically drinks a lot of water, misses having it, and questioned whether she would be able to drink water and other thin liquids when she goes home. She was re-educated regarding the results of the modified barium swallow and the fact that due to her history of silent aspiration a repeat swallow study would be needed to determine whether she would be safe for thin liquids. She verbalized understanding but the extent of her comprehension is questioned considering the nature of her subsequent questions. Pt tolerated ice chips and thin liquids via cup with use of a chin tuck without overt s/sx of aspiration. However, silent aspiration cannot be ruled out at bedside. Min-mod cues were needed for pt to maintain the chin tuck posture after she took the sips of thin water. Pt refused all solid trials despite encouragement due to her being "full". SLP will continue to follow pt.    HPI HPI: Pt adm with PNA and resp failure. Intubated 2/23 and extubated 3/2. PMH includes bipolar disorder, CHF, CVA, lupus, CKD, HTN.      SLP Plan  Continue with current plan of care       Recommendations  Liquids provided via: Cup;Straw Medication Administration: Whole meds with puree Supervision: Full supervision/cueing for compensatory strategies Compensations: Slow rate;Small sips/bites Postural Changes and/or Swallow Maneuvers: Seated upright 90 degrees;Upright 30-60 min after meal                Oral Care Recommendations: Oral  care BID;Oral care prior to ice chip/H20 Follow up Recommendations: Skilled Nursing facility SLP Visit Diagnosis: Dysphagia, pharyngeal phase (R13.13) Plan: Continue with current plan of care       Dalaina Tates I. Hardin Negus, [place], [place] Office number [phone] Pager [phone]-                Horton Marshall 09/09/2018, 11:25 AM

## 2018-09-09 NOTE — Progress Notes (Signed)
PROGRESS NOTE    Anna [name]  BPZ:[id number]  DOB: 1961/04/07  DOA: 08/29/2018 PCP: [name], MD  Brief Narrative:   58 year old female with history of lupus on immunosuppressants at baseline (Imuran, Plaquenil and prednisone) admitted on February 23 with pneumonia/sepsis/metabolic encephalopathy and associated respiratory failure requiring intubation.  Extubated and transferred to medical floor.  Hospital course complicated by acute renal failure secondary to ATN requiring CVVH.  Patient has been followed by nephrology during the hospital course and now off dialysis.  Of note, CT chest on January/20 showed hilar mass/lymphadenopathy and she follows pulmonary Dr. [name] as outpatient (although she states she was not aware of this finding).   Subjective:  Patient noted to be on 3 L nasal cannula O2 and saturating well.  She is awake and alert, oriented x3.  Objective: Vitals:   09/09/18 0446 09/09/18 0500 09/09/18 0752 09/09/18 1210  BP: 131/74  (!) 145/74 (!) 162/82  Pulse: 77  84 66  Resp: 18   10  Temp: 97.7 F (36.5 C)  98.7 F (37.1 C) 98.4 F (36.9 C)  TempSrc: Oral  Oral Oral  SpO2: 92%  96% 96%  Weight:  77.4 kg    Height:        Intake/Output Summary (Last 24 hours) at 09/09/2018 1227 Last data filed at 09/09/2018 1007 Gross per 24 hour  Intake 387 ml  Output 900 ml  Net -513 ml   Filed Weights   09/07/18 0600 09/08/18 0632 09/09/18 0500  Weight: 78.5 kg 78.4 kg 77.4 kg    Physical Examination:  General exam: Appears calm and comfortable  Respiratory system: Clear to auscultation. Respiratory effort normal. Cardiovascular system: S1 & S2 heard, RRR. No JVD, murmurs, rubs, gallops or clicks. No pedal edema. Gastrointestinal system: Abdomen is nondistended, soft and nontender. No organomegaly or masses felt. Normal bowel sounds heard. Central nervous system: Alert and oriented. No focal neurological deficits. Extremities: Symmetric 5 x 5 power. Skin:  No rashes, lesions or ulcers Psychiatry: Judgement and insight okay. Mood & affect appropriate.     Data Reviewed: I have personally reviewed following labs and imaging studies  CBC: Recent Labs  Lab 09/03/18 0347 09/03/18 0427 09/04/18 0430 09/05/18 0250 09/07/18 0607 09/08/18 0301  WBC 8.0  --  11.1* 10.4 7.8 5.6  HGB 11.2* 10.5* 11.5* 12.1 10.5* 9.8*  HCT 33.9* 31.0* 35.9* 36.9 31.0* 28.5*  MCV 102.7*  --  103.2* 102.2* 102.3* 102.9*  PLT 183  --  237 197 193 242   Basic Metabolic Panel: Recent Labs  Lab 09/03/18 0347  09/04/18 0430  09/05/18 0250 09/05/18 1545 09/06/18 0422 09/07/18 0607 09/08/18 0301 09/09/18 0401  NA 136   < > 135   < > 137 137 139 140 142 143  K 4.6   < > 4.9   < > 5.1 5.0 4.3 5.0 5.3* 4.8  CL 100   < > 100   < > 103 102 105 107 109 109  CO2 27   < > 25   < > 25 22 23 24 24 23   GLUCOSE 126*   < > 162*   < > 135* 153* 126* 76 93 76  BUN 32*   < > 34*   < > 57* 75* 84* 88* 79* 69*  CREATININE 1.09*   < > 1.10*   < > 1.68* 1.94* 1.92* 2.20* 2.31* 2.59*  CALCIUM 8.4*   < > 8.9   < > 9.4 9.1 8.5*  9.1 8.7* 8.9  MG 2.5*  --  2.4  --  2.3  --  1.9 1.7  --   --   PHOS 2.7   < > 2.2*   < > 4.0 6.0* 5.4* 5.2*  --  5.0*   < > = values in this interval not displayed.   GFR: Estimated Creatinine Clearance: 24.6 mL/min (A) (by C-G formula based on SCr of 2.59 mg/dL (H)). Liver Function Tests: Recent Labs  Lab 09/04/18 1729 09/05/18 0250 09/05/18 1545 09/06/18 0422 09/09/18 0401  ALBUMIN 2.4* 2.5* 2.2* 2.1* 2.4*   No results for input(s): LIPASE, AMYLASE in the last 168 hours. No results for input(s): AMMONIA in the last 168 hours. Coagulation Profile: No results for input(s): INR, PROTIME in the last 168 hours. Cardiac Enzymes: No results for input(s): CKTOTAL, CKMB, CKMBINDEX, TROPONINI in the last 168 hours. BNP (last 3 results) No results for input(s): PROBNP in the last 8760 hours. HbA1C: No results for input(s): HGBA1C in the last 72  hours. CBG: Recent Labs  Lab 09/08/18 2100 09/08/18 2335 09/09/18 0444 09/09/18 0754 09/09/18 1212  GLUCAP 115* 137* 80 95 121*   Lipid Profile: Recent Labs    09/07/18 1429  TRIG 82   Thyroid Function Tests: No results for input(s): TSH, T4TOTAL, FREET4, T3FREE, THYROIDAB in the last 72 hours. Anemia Panel: No results for input(s): VITAMINB12, FOLATE, FERRITIN, TIBC, IRON, RETICCTPCT in the last 72 hours. Sepsis Labs: No results for input(s): PROCALCITON, LATICACIDVEN in the last 168 hours.  No results found for this or any previous visit (from the past 240 hour(s)).    Radiology Studies: No results found.      Scheduled Meds: . Chlorhexidine Gluconate Cloth  6 each Topical Daily  . Chlorhexidine Gluconate Cloth  6 each Topical Daily  . cloNIDine  0.1 mg Per Tube BID  . escitalopram  20 mg Oral Daily  . feeding supplement (NEPRO CARB STEADY)  237 mL Oral BID BM  . folic acid  1 mg Oral Daily  . heparin  5,000 Units Subcutaneous Q8H  . hydrALAZINE  50 mg Oral Q8H  . hydroxychloroquine  200 mg Oral Daily  . insulin aspart  0-15 Units Subcutaneous Q4H  . levothyroxine  50 mcg Oral Q0600  . mouth rinse  15 mL Mouth Rinse BID  . metoprolol tartrate  25 mg Oral BID  . pantoprazole  40 mg Oral QHS  . polyethylene glycol  17 g Oral BID  . predniSONE  5 mg Oral Q breakfast  . QUEtiapine  400 mg Oral QHS  . sennosides  5 mL Oral BID  . sodium chloride flush  10-40 mL Intracatheter Q12H  . vitamin B-12  1,000 mcg Oral Daily   Continuous Infusions: . sodium chloride Stopped (09/06/18 1101)  . sodium chloride      Assessment & Plan:    1.Acute hypoxic respiratory failure/right lower lobe pneumonia: Present on admission. She is immunocompromised on Imuran and prednisone- s/p course of levofloxacin ended 3/1 (completed course of antibiotics)-Status post intubation and extubation.  Still requiring 3 L of nasal cannula.  Titrate to off as tolerated.  2.   Sepsis/metabolic encephalopathy: Present on admission secondary to problem #1.  3.  Acute renal failure: Secondary to sepsis/ATN likely.  Appreciate nephrology evaluation and follow-up.  Patient off CVVD, creatinine on labs however today up at 2.59.  Continue to monitor and defer management to nephrology.  Patient did receive 1 dose of Lasix on March.  4. Hilar lymphadenopathy/?  Mass with history of lupus: Follows pulmonary as outpatient and plan for PET scan by Dr. [name].  5.  History of lupus: Imuran on hold.  Received stress dose steroids in the initial hospital course.  Now resumed Plaquenil/home steroids.  6.  Hypothyroidism: Synthroid  7.  Hypertension: On clonidine, hydralazine and metoprolol.   8. Nicotine dependence: Counseled regarding cessation.  Had PFTs in 2018.  May have underlying COPD.  Neb treatments PRN  DVT prophylaxis: Heparin Code Status: Full code Family / Patient Communication: Discussed with patient and all questions answered Disposition Plan: Likely home over the weekend once cleared by nephrology and O2 tapered to off.  Can downgrade to telemetry status.     LOS: 11 days    Time spent: 40 minutes    Guilford Shi, MD [facility] Pager [phone]  If 7PM-7AM, please contact night-coverage [email or web address] Password [facility] 09/09/2018, 12:27 PM

## 2018-09-09 NOTE — Progress Notes (Signed)
[name] NEPHROLOGY PROGRESS NOTE  Assessment/ Plan: Pt is a 58 y.o. yo female with history of lupus on Imuran Plaquenil and prednisone admitted with sepsis due to pneumonia and ischemic ATN.  #AKI on CKD stage III in the setting of sepsis and ischemic ATN.  Patient required CVVHD until 2/29.  Patient is nonoliguric with fluctuation in serum creatinine level.  -Patient is nonoliguric and serum creatinine level mildly increasing to 2.59 today likely due to peritoneal.  Patient was n.p.o. and looks dry on physical exam.  Start normal saline.  Repeat labs in the morning.  - Avoid nephrotoxins.  After discharge from hospital patient will follow with Dr. Jimmy Footman at [facility].  #Sepsis due to pneumonia in immunocompromised patient.  #Altered mental status due to sepsis and hypoxia.  Mental status seems improving  #Respiratory failure on ventilator per PCCM: Now extubated and doing well.  #Anemia: Monitor hemoglobin.  #SLE: On Plaquenil.  Imuran on hold.   Subjective: Seen and examined.  Still some confusion.  Denied chest pain, shortness of breath, nausea or vomiting. Objective Vital signs in last 24 hours: Vitals:   09/08/18 2332 09/09/18 0446 09/09/18 0500 09/09/18 0752  BP: (!) 107/55 131/74  (!) 145/74  Pulse: 82 77  84  Resp: 20 18    Temp: 98.3 F (36.8 C) 97.7 F (36.5 C)  98.7 F (37.1 C)  TempSrc: Oral Oral  Oral  SpO2: 99% 92%  96%  Weight:   77.4 kg   Height:       Weight change: -0.981 kg  Intake/Output Summary (Last 24 hours) at 09/09/2018 1144 Last data filed at 09/09/2018 1007 Gross per 24 hour  Intake 387 ml  Output 900 ml  Net -513 ml       Labs: Basic Metabolic Panel: Recent Labs  Lab 09/06/18 0422 09/07/18 0607 09/08/18 0301 09/09/18 0401  NA 139 140 142 143  K 4.3 5.0 5.3* 4.8  CL 105 107 109 109  CO2 23 24 24 23   GLUCOSE 126* 76 93 76  BUN 84* 88* 79* 69*  CREATININE 1.92* 2.20* 2.31* 2.59*  CALCIUM 8.5* 9.1 8.7* 8.9  PHOS  5.4* 5.2*  --  5.0*   Liver Function Tests: Recent Labs  Lab 09/05/18 1545 09/06/18 0422 09/09/18 0401  ALBUMIN 2.2* 2.1* 2.4*   No results for input(s): LIPASE, AMYLASE in the last 168 hours. No results for input(s): AMMONIA in the last 168 hours. CBC: Recent Labs  Lab 09/03/18 0347  09/04/18 0430 09/05/18 0250 09/07/18 0607 09/08/18 0301  WBC 8.0  --  11.1* 10.4 7.8 5.6  HGB 11.2*   < > 11.5* 12.1 10.5* 9.8*  HCT 33.9*   < > 35.9* 36.9 31.0* 28.5*  MCV 102.7*  --  103.2* 102.2* 102.3* 102.9*  PLT 183  --  237 197 193 189   < > = values in this interval not displayed.   Cardiac Enzymes: No results for input(s): CKTOTAL, CKMB, CKMBINDEX, TROPONINI in the last 168 hours. CBG: Recent Labs  Lab 09/08/18 1714 09/08/18 2100 09/08/18 2335 09/09/18 0444 09/09/18 0754  GLUCAP 125* 115* 137* 80 95    Iron Studies: No results for input(s): IRON, TIBC, TRANSFERRIN, FERRITIN in the last 72 hours. Studies/Results: Dg Swallowing Func-speech Pathology  Result Date: 09/07/2018 Note populated for Ellis Savage, Student SLP Objective Swallowing Evaluation: Type of Study: MBS-Modified Barium Swallow Study  Patient Details Name: Anna [name] MRN: [id number] Date of Birth: [birth year] Today's Date: 09/07/2018 Time:  SLP Start Time (ACUTE ONLY): 1108 -SLP Stop Time (ACUTE ONLY): 1140 SLP Time Calculation (min) (ACUTE ONLY): 32 min Past Medical History: Past Medical History: Diagnosis Date . Acute CHF ([place]) 07/2016 . Depressive disorder, not elsewhere classified  . Family history of adverse reaction to anesthesia   mother passed away during surgery . Hypertension  . Hypothyroid   Dr. Wilson Singer . Kidney stone  . Lupus (systemic lupus erythematosus) ([place])  . Migraine  . Pure hypercholesterolemia  . Stroke [facility])  Past Surgical History: Past Surgical History: Procedure Laterality Date . CESAREAN SECTION   . ECTOPIC PREGNANCY SURGERY    times 2 . JOINT REPLACEMENT   . LAPAROSCOPY    with rt salpingectomy .  [place] SURGERY  2003 . TONSILLECTOMY   . TOTAL ABDOMINAL HYSTERECTOMY  2004  ovaries retained, DUB . TUBAL LIGATION  1985 HPI: Pt adm with PNA and resp failure. Intubated 2/23 and extubated 3/2. PMH includes bipolar disorder, CHF, CVA, lupus, CKD, HTN.  Subjective: pt alert, asking repetitive questions Assessment / Plan / Recommendation CHL IP CLINICAL IMPRESSIONS 09/07/2018 Clinical Impression Pt presents with moderate pharyngeal dysphagia following prolonged intubation and exacerbated by cognitive status. Swallow dysfunction marked by suspected incomplete glottal closure, decreased laryngeal sensation, inconsistent timing of swallow initiation and impulsive consumption of liquids resulting in silent aspiration of thin liquids. Max verbal cues to elicit clearance of aspirate with cough resulted in partial clearance. Chin tuck did not consistently prevent aspiration. Nectar thick liquids prevented aspiration and puree/regular solid POs consumed with no aspiration or excessive residue. Pt belched intermittently throughout testing and coughed x3 after. Given prolonged intubation, subsequent decrease in laryngeal sensation and cognitive status, recommend dys 3, nectar liquid diet with full supervision to take small bites/sips and consume at a slow rate. Pt may have ice chips intermittently throughout day in betwen meals after thorough oral care. SLP to f/u to for diet tolerance and provide strategies and exercises for safe swallowing.  SLP Visit Diagnosis Dysphagia, pharyngeal phase (R13.13) Attention and concentration deficit following -- Frontal lobe and executive function deficit following -- Impact on safety and function Moderate aspiration risk   CHL IP TREATMENT RECOMMENDATION 09/07/2018 Treatment Recommendations Therapy as outlined in treatment plan below   Prognosis 09/07/2018 Prognosis for Safe Diet Advancement Good Barriers to Reach Goals -- Barriers/Prognosis Comment -- CHL IP DIET RECOMMENDATION 09/07/2018 SLP  Diet Recommendations Dysphagia 3 (Mech soft) solids;Nectar thick liquid Liquid Administration via Cup Medication Administration Whole meds with puree Compensations Slow rate;Small sips/bites Postural Changes Remain semi-upright after after feeds/meals (Comment);Seated upright at 90 degrees   CHL IP OTHER RECOMMENDATIONS 09/07/2018 Recommended Consults -- Oral Care Recommendations -- Other Recommendations Have oral suction available   CHL IP FOLLOW UP RECOMMENDATIONS 09/07/2018 Follow up Recommendations Skilled Nursing facility   [facility] IP FREQUENCY AND DURATION 09/07/2018 Speech Therapy Frequency (ACUTE ONLY) min 2x/week Treatment Duration 2 weeks      CHL IP ORAL PHASE 09/07/2018 Oral Phase WFL Oral - Pudding Teaspoon -- Oral - Pudding Cup -- Oral - Honey Teaspoon -- Oral - Honey Cup -- Oral - Nectar Teaspoon -- Oral - Nectar Cup -- Oral - Nectar Straw -- Oral - Thin Teaspoon -- Oral - Thin Cup -- Oral - Thin Straw -- Oral - Puree -- Oral - Mech Soft -- Oral - Regular -- Oral - Multi-Consistency -- Oral - Pill -- Oral Phase - Comment --  CHL IP PHARYNGEAL PHASE 09/07/2018 Pharyngeal Phase Impaired Pharyngeal- Pudding Teaspoon -- Pharyngeal --  Pharyngeal- Pudding Cup -- Pharyngeal -- Pharyngeal- Honey Teaspoon -- Pharyngeal -- Pharyngeal- Honey Cup -- Pharyngeal -- Pharyngeal- Nectar Teaspoon -- Pharyngeal -- Pharyngeal- Nectar Cup Delayed swallow initiation-vallecula;Delayed swallow initiation-pyriform sinuses;Penetration/Aspiration during swallow;Compensatory strategies attempted (with notebox) Pharyngeal Material enters airway, remains ABOVE vocal cords then ejected out Pharyngeal- Nectar Straw Delayed swallow initiation-vallecula;Delayed swallow initiation-pyriform sinuses;Penetration/Aspiration during swallow;Compensatory strategies attempted (with notebox) Pharyngeal Material enters airway, remains ABOVE vocal cords then ejected out Pharyngeal- Thin Teaspoon -- Pharyngeal -- Pharyngeal- Thin Cup Delayed swallow  initiation-vallecula;Delayed swallow initiation-pyriform sinuses;Penetration/Aspiration during swallow;Compensatory strategies attempted (with notebox) Pharyngeal Material enters airway, passes BELOW cords without attempt by patient to eject out (silent aspiration) Pharyngeal- Thin Straw Delayed swallow initiation-vallecula;Delayed swallow initiation-pyriform sinuses;Penetration/Aspiration during swallow;Compensatory strategies attempted (with notebox) Pharyngeal Material enters airway, passes BELOW cords without attempt by patient to eject out (silent aspiration);Material enters airway, CONTACTS cords and not ejected out Pharyngeal- Puree WFL Pharyngeal -- Pharyngeal- Mechanical Soft -- Pharyngeal -- Pharyngeal- Regular WFL Pharyngeal -- Pharyngeal- Multi-consistency -- Pharyngeal -- Pharyngeal- Pill -- Pharyngeal -- Pharyngeal Comment --  CHL IP CERVICAL ESOPHAGEAL PHASE 09/07/2018 Cervical Esophageal Phase (No Data) Pudding Teaspoon -- Pudding Cup -- Honey Teaspoon -- Honey Cup -- Nectar Teaspoon -- Nectar Cup -- Nectar Straw -- Thin Teaspoon -- Thin Cup -- Thin Straw -- Puree -- Mechanical Soft -- Regular -- Multi-consistency -- Pill -- Cervical Esophageal Comment -- [name] Nix 09/07/2018, 1:33 PM  Pollyann Glen, M.A. CCC-SLP Acute Rehabilitation Services Pager [phone] Office [phone]              Medications: Infusions: . sodium chloride Stopped (09/06/18 1101)  . sodium chloride      Scheduled Medications: . Chlorhexidine Gluconate Cloth  6 each Topical Daily  . Chlorhexidine Gluconate Cloth  6 each Topical Daily  . cloNIDine  0.1 mg Per Tube BID  . escitalopram  20 mg Oral Daily  . feeding supplement (NEPRO CARB STEADY)  237 mL Oral BID BM  . folic acid  1 mg Oral Daily  . heparin  5,000 Units Subcutaneous Q8H  . hydrALAZINE  50 mg Oral Q8H  . hydroxychloroquine  200 mg Oral Daily  . insulin aspart  0-15 Units Subcutaneous Q4H  . levothyroxine  50 mcg Oral Q0600  . mouth rinse  15 mL  Mouth Rinse BID  . metoprolol tartrate  25 mg Oral BID  . pantoprazole  40 mg Oral QHS  . polyethylene glycol  17 g Oral BID  . predniSONE  5 mg Oral Q breakfast  . QUEtiapine  400 mg Oral QHS  . sennosides  5 mL Oral BID  . sodium chloride flush  10-40 mL Intracatheter Q12H  . vitamin B-12  1,000 mcg Oral Daily    have reviewed scheduled and prn medications.  Physical Exam: General: Not in distress Heart:RRR, s1s2 nl, no rubs Lungs: Clear bilateral, no wheezing or crackles Abdomen:soft, Non-tender, non-distended Extremities: No lower extremity edema. Neurology: Alert awake and following commands   Tanna Furry 09/09/2018,11:44 AM  LOS: 11 days

## 2018-09-10 ENCOUNTER — Inpatient Hospital Stay (HOSPITAL_COMMUNITY): Payer: BLUE CROSS/BLUE SHIELD

## 2018-09-10 LAB — GLUCOSE, CAPILLARY
Glucose-Capillary: 103 mg/dL — ABNORMAL HIGH (ref 70–99)
Glucose-Capillary: 115 mg/dL — ABNORMAL HIGH (ref 70–99)
Glucose-Capillary: 87 mg/dL (ref 70–99)
Glucose-Capillary: 90 mg/dL (ref 70–99)
Glucose-Capillary: 94 mg/dL (ref 70–99)
Glucose-Capillary: 96 mg/dL (ref 70–99)
Glucose-Capillary: 98 mg/dL (ref 70–99)

## 2018-09-10 LAB — RENAL FUNCTION PANEL
Albumin: 2.4 g/dL — ABNORMAL LOW (ref 3.5–5.0)
Anion gap: 7 (ref 5–15)
BUN: 58 mg/dL — ABNORMAL HIGH (ref 6–20)
CO2: 20 mmol/L — AB (ref 22–32)
CREATININE: 2.69 mg/dL — AB (ref 0.44–1.00)
Calcium: 8.7 mg/dL — ABNORMAL LOW (ref 8.9–10.3)
Chloride: 117 mmol/L — ABNORMAL HIGH (ref 98–111)
GFR calc non Af Amer: 19 mL/min — ABNORMAL LOW (ref >60)
GFR, EST AFRICAN AMERICAN: 22 mL/min — AB (ref >60)
Glucose, Bld: 94 mg/dL (ref 70–99)
Phosphorus: 5 mg/dL — ABNORMAL HIGH (ref 2.5–4.6)
Potassium: 4.9 mmol/L (ref 3.5–5.1)
Sodium: 144 mmol/L (ref 135–145)

## 2018-09-10 LAB — TRIGLYCERIDES: Triglycerides: 95 mg/dL (ref <150)

## 2018-09-10 MED ORDER — HYDRALAZINE HCL 20 MG/ML IJ SOLN
10.0000 mg | Freq: Four times a day (QID) | INTRAMUSCULAR | Status: DC | PRN
  Administered 2018-09-10: 10 mg via INTRAVENOUS
  Filled 2018-09-10: qty 1

## 2018-09-10 MED ORDER — SODIUM CHLORIDE 0.45 % IV SOLN
INTRAVENOUS | Status: AC
  Administered 2018-09-10: 15:00:00 via INTRAVENOUS

## 2018-09-10 MED ORDER — SODIUM CHLORIDE 0.9 % IV SOLN: INTRAVENOUS | Status: DC

## 2018-09-10 NOTE — Clinical Social Work Note (Addendum)
Clinical Social Work Assessment  Patient Details  Name: Anna [name] MRN: [id number] Date of Birth: Dec 25, 1960  Date of referral:  09/10/18               Reason for consult:  Facility Placement                Permission sought to share information with:  Family Supports Permission granted to share information::  Yes, Verbal Permission Granted  Name::     Anna [name]   Agency::     Relationship::  husband   Contact Information:  [phone]  Housing/Transportation Living arrangements for the past 2 months:  [place] of Information:  Patient, Spouse Patient Interpreter Needed:  None Criminal Activity/Legal Involvement Pertinent to Current Situation/Hospitalization:  No - Comment as needed Significant Relationships:  Spouse Lives with:  Self, Spouse Do you feel safe going back to the place where you live?    Need for family participation in patient care:  Yes (Comment)  Care giving concerns:  CSW consulted for SNF placement.    Social Worker assessment / plan:  CSW visited with patient at bedside. She was alert and oriented. Patient states she has been to a SNF before and now she wants to go home. Patient states  Her husband works during the day. However, she has two close friends that will come and assist her with care. CSW spoke with the patient's husband by phone and he agreed with the discharge plan.   Employment status:  Disabled (Comment on whether or not currently receiving Disability) Insurance information:  Managed Care PT Recommendations:  [place] / Referral to community resources:     Patient/Family's Response to care:  Patient's husband was understanding of the patient wanting to go home. Patient and spouse appreciated the CSW contact and had no further questions or concerns at this time.  Patient/Family's Understanding of and Emotional Response to Diagnosis, Current Treatment, and Prognosis:  Patient and her spouse   is realistic regarding therapy needs but the wants to go home with home health. Patient is confident that she will receive the care she needs and will be better off at home. Patient expressed understanding of CSW role and discharge process as well as medical condition. No questions/concerns about plan or treatment at this time.   Emotional Assessment Appearance:  Appears older than stated age Attitude/Demeanor/Rapport:  Engaged, Self-Confident Affect (typically observed):  Appropriate, Accepting Orientation:  Oriented to Self, Oriented to Place, Oriented to  Time, Oriented to Situation Alcohol / Substance use:  Illicit Drugs Psych involvement (Current and /or in the community):  No (Comment)  Discharge Needs  Concerns to be addressed:  Patient refuses services, Other (Comment Required(pt refused SNF) Readmission within the last 30 days:  Yes Current discharge risk:  Dependent with Mobility Barriers to Discharge:  Continued Medical Work up   Patient refused SNF. Clinical Social Worker will sign off for now as social work intervention is no longer needed. Please consult [place] if new need arises.    Thurmond Butts, [facility] Clinical Social Worker [place], [place] 09/10/2018, 12:11 PM

## 2018-09-10 NOTE — Progress Notes (Signed)
Physical Therapy Treatment Patient Details Name: Anna [name] MRN: [id number] DOB: 04-22-1961 Today's Date: 09/10/2018    History of Present Illness 58 yo female adm with PNA and resp failure. Intubated 2/23 and extubated 3/2. PMH includes bipolar disorder, CHF, CVA, lupus, CKD, HTN.    PT Comments    Pt was seen for gait and exercises, noting O2 sats to be above 88% on room air, with a low of 89% only very briefly while patient was laughing a great deal along with exercises.  Follow acutely for this plan of care, to continue to monitor and encourage her to be active with cks of vitals to ensure she is progressing as expected.   Follow Up Recommendations  SNF     Equipment Recommendations  None recommended by PT    Recommendations for Other Services       Precautions / Restrictions Precautions Precautions: Fall Restrictions Weight Bearing Restrictions: No    Mobility  Bed Mobility Overal bed mobility: Needs Assistance Bed Mobility: Supine to Sit;Sit to Supine     Supine to sit: Supervision Sit to supine: Supervision      Transfers Overall transfer level: Needs assistance Equipment used: Rolling walker (2 wheeled);1 person hand held assist Transfers: Sit to/from Stand Sit to Stand: Supervision         General transfer comment: supervised today to use correct hand placement  Ambulation/Gait Ambulation/Gait assistance: Min guard Gait Distance (Feet): 150 Feet Assistive device: Rolling walker (2 wheeled) Gait Pattern/deviations: Step-through pattern;Decreased stride length;Wide base of support Gait velocity: decr Gait velocity interpretation: <1.31 ft/sec, indicative of household ambulator General Gait Details: used no O2 and sat was 90% after walk   Stairs             Wheelchair Mobility    Modified Rankin (Stroke Patients Only)       Balance Overall balance assessment: Needs assistance Sitting-balance support: Feet supported Sitting  balance-Leahy Scale: Good     Standing balance support: Bilateral upper extremity supported;During functional activity Standing balance-Leahy Scale: Fair Standing balance comment: less than fair dynamically                            Cognition Arousal/Alertness: Awake/alert Behavior During Therapy: WFL for tasks assessed/performed Overall Cognitive Status: Impaired/Different from baseline Area of Impairment: Memory;Following commands;Safety/judgement;Awareness;Problem solving                   Current Attention Level: Selective Memory: Decreased short-term memory Following Commands: Follows one step commands with increased time;Follows one step commands inconsistently Safety/Judgement: Decreased awareness of safety;Decreased awareness of deficits Awareness: Intellectual Problem Solving: Requires verbal cues        Exercises General Exercises - Lower Extremity Ankle Circles/Pumps: Both;AROM Long Arc Quad: AROM;Both;10 reps Heel Slides: AROM;Both;10 reps Hip Flexion/Marching: AROM;Both;10 reps    General Comments General comments (skin integrity, edema, etc.): pt was seen for mobility with O2 controlled even with exercise until she was laughing, then down to 89%.  Notified nursing of her tolerances      Pertinent Vitals/Pain Pain Assessment: No/denies pain    Home Living                      Prior Function            PT Goals (current goals can now be found in the care plan section) Acute Rehab PT Goals Patient Stated Goal: go home Progress towards  PT goals: Progressing toward goals    Frequency    Min 3X/week      PT Plan Current plan remains appropriate    Co-evaluation              AM-PAC PT "6 Clicks" Mobility   Outcome Measure  Help needed turning from your back to your side while in a flat bed without using bedrails?: A Little Help needed moving from lying on your back to sitting on the side of a flat bed without  using bedrails?: A Little Help needed moving to and from a bed to a chair (including a wheelchair)?: A Little Help needed standing up from a chair using your arms (e.g., wheelchair or bedside chair)?: A Little Help needed to walk in hospital room?: A Little Help needed climbing 3-5 steps with a railing? : A Lot 6 Click Score: 17    End of Session Equipment Utilized During Treatment: Gait belt Activity Tolerance: Patient tolerated treatment well Patient left: in bed;with call bell/phone within reach;with bed alarm set Nurse Communication: Mobility status PT Visit Diagnosis: Unsteadiness on feet (R26.81);Other abnormalities of gait and mobility (R26.89);Muscle weakness (generalized) (M62.81);History of falling (Z91.81)     Time: 9150-5697 PT Time Calculation (min) (ACUTE ONLY): 32 min  Charges:  $Gait Training: 8-22 mins $Therapeutic Exercise: 8-22 mins                     [name] 09/10/2018, 1:44 PM  [name], PT MS Acute Rehab Dept. Number: [place] and [place]

## 2018-09-10 NOTE — Progress Notes (Signed)
[facility] NEPHROLOGY PROGRESS NOTE  Assessment/ Plan: Pt is a 58 y.o. yo female with history of lupus on Imuran Plaquenil and prednisone admitted with sepsis due to pneumonia and ischemic ATN.  #AKI on CKD stage III in the setting of sepsis and ischemic ATN.  Patient required CVVHD until 2/29.  Patient is nonoliguric with fluctuation in serum creatinine level.  -Patient is nonoliguric and serum creatinine level mildly increasing to 2.6 today likely due to prerenal.  Patient has very poor oral intake and looks euvolemic on exam.  I am planning to continue IV fluid today.  Respiratory status is good.    - Avoid nephrotoxins.  After discharge from hospital patient will follow with Dr. Jimmy Footman at [facility].  #Sepsis due to pneumonia in immunocompromised patient.  #Altered mental status due to sepsis and hypoxia.  Mental status seems improving  #Respiratory failure on ventilator per PCCM: Now extubated and doing well.  #Anemia: Monitor hemoglobin.  #SLE: On Plaquenil.  Imuran on hold.   Subjective: Seen and examined.  Mental status seems improving.  Denied chest pain, shortness of breath, cough, nausea or vomiting. Objective Vital signs in last 24 hours: Vitals:   09/09/18 1610 09/09/18 2118 09/10/18 0434 09/10/18 0618  BP: (!) 156/86 (!) 178/99 130/73   Pulse: 72 72 74   Resp: 20 (!) 28 12 20   Temp: 98.2 F (36.8 C) 98.7 F (37.1 C) 98.9 F (37.2 C)   TempSrc: Oral Oral Oral   SpO2: 100% 98% 98%   Weight:    76.2 kg  Height:       Weight change: -1.196 kg  Intake/Output Summary (Last 24 hours) at 09/10/2018 1005 Last data filed at 09/10/2018 9758 Gross per 24 hour  Intake 1248.31 ml  Output 1025 ml  Net 223.31 ml       Labs: Basic Metabolic Panel: Recent Labs  Lab 09/07/18 0607 09/08/18 0301 09/09/18 0401 09/10/18 0235  NA 140 142 143 144  K 5.0 5.3* 4.8 4.9  CL 107 109 109 117*  CO2 24 24 23  20*  GLUCOSE 76 93 76 94  BUN 88* 79* 69* 58*   CREATININE 2.20* 2.31* 2.59* 2.69*  CALCIUM 9.1 8.7* 8.9 8.7*  PHOS 5.2*  --  5.0* 5.0*   Liver Function Tests: Recent Labs  Lab 09/06/18 0422 09/09/18 0401 09/10/18 0235  ALBUMIN 2.1* 2.4* 2.4*   No results for input(s): LIPASE, AMYLASE in the last 168 hours. No results for input(s): AMMONIA in the last 168 hours. CBC: Recent Labs  Lab 09/04/18 0430 09/05/18 0250 09/07/18 0607 09/08/18 0301  WBC 11.1* 10.4 7.8 5.6  HGB 11.5* 12.1 10.5* 9.8*  HCT 35.9* 36.9 31.0* 28.5*  MCV 103.2* 102.2* 102.3* 102.9*  PLT 237 197 193 189   Cardiac Enzymes: No results for input(s): CKTOTAL, CKMB, CKMBINDEX, TROPONINI in the last 168 hours. CBG: Recent Labs  Lab 09/09/18 2020 09/10/18 0016 09/10/18 0025 09/10/18 0428 09/10/18 0809  GLUCAP 82 98 103* 90 87    Iron Studies: No results for input(s): IRON, TIBC, TRANSFERRIN, FERRITIN in the last 72 hours. Studies/Results: No results found.  Medications: Infusions: . sodium chloride Stopped (09/06/18 1101)  . sodium chloride      Scheduled Medications: . Chlorhexidine Gluconate Cloth  6 each Topical Daily  . Chlorhexidine Gluconate Cloth  6 each Topical Daily  . cloNIDine  0.1 mg Per Tube BID  . escitalopram  20 mg Oral Daily  . feeding supplement (NEPRO CARB STEADY)  237 mL Oral BID BM  . folic acid  1 mg Oral Daily  . heparin  5,000 Units Subcutaneous Q8H  . hydrALAZINE  50 mg Oral Q8H  . hydroxychloroquine  200 mg Oral Daily  . insulin aspart  0-15 Units Subcutaneous Q4H  . levothyroxine  50 mcg Oral Q0600  . mouth rinse  15 mL Mouth Rinse BID  . metoprolol tartrate  25 mg Oral BID  . pantoprazole  40 mg Oral QHS  . polyethylene glycol  17 g Oral BID  . predniSONE  5 mg Oral Q breakfast  . QUEtiapine  400 mg Oral QHS  . sennosides  5 mL Oral BID  . sodium chloride flush  10-40 mL Intracatheter Q12H  . vitamin B-12  1,000 mcg Oral Daily    have reviewed scheduled and prn medications.  Physical Exam: General:  Not in distress, lying in bed comfortable Heart:RRR, s1s2 nl, no rubs Lungs: Clear bilateral, no wheezing or crackle Abdomen:soft, Non-tender, non-distended Extremities: No lower extremity edema. Neurology: Alert awake and following commands  Nieves Barberi Tanna Furry 09/10/2018,10:05 AM  LOS: 12 days

## 2018-09-10 NOTE — Progress Notes (Signed)
PROGRESS NOTE    Anna [name]  DGU:[id number]  DOB: 1960-09-27  DOA: 08/29/2018 PCP: [name], MD  Brief Narrative:   58 year old female with history of lupus on immunosuppressants at baseline (Imuran, Plaquenil and prednisone) admitted on February 23 with pneumonia/sepsis/metabolic encephalopathy and associated respiratory failure requiring intubation.  Extubated and transferred to medical floor.  Hospital course complicated by acute renal failure secondary to ATN requiring CVVH.  Patient has been followed by nephrology during the hospital course and now off dialysis.  Of note, CT chest on January/20 showed hilar mass/lymphadenopathy and she follows pulmonary Dr. [name] as outpatient (although she states she was not aware of this finding).   Subjective:  Patient noted to be on 3 L nasal cannula O2 and saturating well.  She is awake and alert, oriented x3.  Objective: Vitals:   09/10/18 1508 09/10/18 1514 09/10/18 1541 09/10/18 1941  BP: (!) 185/99 (!) 170/99 (!) 168/78 (!) 163/87  Pulse:    70  Resp: (!) 27 (!) 25  (!) 34  Temp:    98.4 F (36.9 C)  TempSrc:    Oral  SpO2:      Weight:      Height:        Intake/Output Summary (Last 24 hours) at 09/10/2018 2003 Last data filed at 09/10/2018 1600 Gross per 24 hour  Intake 1319.45 ml  Output 1375 ml  Net -55.55 ml   Filed Weights   09/08/18 0632 09/09/18 0500 09/10/18 0618  Weight: 78.4 kg 77.4 kg 76.2 kg    Physical Examination:  General exam: Appears calm and comfortable  Respiratory system: Clear to auscultation. Respiratory effort normal. Cardiovascular system: S1 & S2 heard, RRR. No JVD, murmurs, rubs, gallops or clicks. No pedal edema. Gastrointestinal system: Abdomen is nondistended, soft and nontender. No organomegaly or masses felt. Normal bowel sounds heard. Central nervous system: Alert and oriented. No focal neurological deficits. Extremities: Symmetric 5 x 5 power. Skin: No rashes, lesions or  ulcers Psychiatry: Judgement and insight okay. Mood & affect appropriate.     Data Reviewed: I have personally reviewed following labs and imaging studies  CBC: Recent Labs  Lab 09/04/18 0430 09/05/18 0250 09/07/18 0607 09/08/18 0301  WBC 11.1* 10.4 7.8 5.6  HGB 11.5* 12.1 10.5* 9.8*  HCT 35.9* 36.9 31.0* 28.5*  MCV 103.2* 102.2* 102.3* 102.9*  PLT 237 197 193 956   Basic Metabolic Panel: Recent Labs  Lab 09/04/18 0430  09/05/18 0250 09/05/18 1545 09/06/18 0422 09/07/18 0607 09/08/18 0301 09/09/18 0401 09/10/18 0235  NA 135   < > 137 137 139 140 142 143 144  K 4.9   < > 5.1 5.0 4.3 5.0 5.3* 4.8 4.9  CL 100   < > 103 102 105 107 109 109 117*  CO2 25   < > 25 22 23 24 24 23  20*  GLUCOSE 162*   < > 135* 153* 126* 76 93 76 94  BUN 34*   < > 57* 75* 84* 88* 79* 69* 58*  CREATININE 1.10*   < > 1.68* 1.94* 1.92* 2.20* 2.31* 2.59* 2.69*  CALCIUM 8.9   < > 9.4 9.1 8.5* 9.1 8.7* 8.9 8.7*  MG 2.4  --  2.3  --  1.9 1.7  --   --   --   PHOS 2.2*   < > 4.0 6.0* 5.4* 5.2*  --  5.0* 5.0*   < > = values in this interval not displayed.   GFR: Estimated  Creatinine Clearance: 23.5 mL/min (A) (by C-G formula based on SCr of 2.69 mg/dL (H)). Liver Function Tests: Recent Labs  Lab 09/05/18 0250 09/05/18 1545 09/06/18 0422 09/09/18 0401 09/10/18 0235  ALBUMIN 2.5* 2.2* 2.1* 2.4* 2.4*   No results for input(s): LIPASE, AMYLASE in the last 168 hours. No results for input(s): AMMONIA in the last 168 hours. Coagulation Profile: No results for input(s): INR, PROTIME in the last 168 hours. Cardiac Enzymes: No results for input(s): CKTOTAL, CKMB, CKMBINDEX, TROPONINI in the last 168 hours. BNP (last 3 results) No results for input(s): PROBNP in the last 8760 hours. HbA1C: No results for input(s): HGBA1C in the last 72 hours. CBG: Recent Labs  Lab 09/10/18 0025 09/10/18 0428 09/10/18 0809 09/10/18 1149 09/10/18 1622  GLUCAP 103* 90 87 115* 94   Lipid Profile: Recent Labs     09/10/18 1259  TRIG 95   Thyroid Function Tests: No results for input(s): TSH, T4TOTAL, FREET4, T3FREE, THYROIDAB in the last 72 hours. Anemia Panel: No results for input(s): VITAMINB12, FOLATE, FERRITIN, TIBC, IRON, RETICCTPCT in the last 72 hours. Sepsis Labs: No results for input(s): PROCALCITON, LATICACIDVEN in the last 168 hours.  No results found for this or any previous visit (from the past 240 hour(s)).    Radiology Studies: Dg Swallowing Func-speech Pathology  Result Date: 09/10/2018 Objective Swallowing Evaluation: Type of Study: MBS-Modified Barium Swallow Study  Patient Details Name: Anna [name] MRN: [id number] Date of Birth: 07/23/1960 Today's Date: 09/10/2018 Time: SLP Start Time (ACUTE ONLY): 1359 -SLP Stop Time (ACUTE ONLY): 1417 SLP Time Calculation (min) (ACUTE ONLY): 18 min Past Medical History: Past Medical History: Diagnosis Date . Acute CHF ([place]) 07/2016 . Depressive disorder, not elsewhere classified  . Family history of adverse reaction to anesthesia   mother passed away during surgery . Hypertension  . Hypothyroid   Dr. Wilson Singer . Kidney stone  . Lupus (systemic lupus erythematosus) ([place])  . Migraine  . Pure hypercholesterolemia  . Stroke [facility])  Past Surgical History: Past Surgical History: Procedure Laterality Date . CESAREAN SECTION   . ECTOPIC PREGNANCY SURGERY    times 2 . JOINT REPLACEMENT   . LAPAROSCOPY    with rt salpingectomy . [place] SURGERY  2003 . TONSILLECTOMY   . TOTAL ABDOMINAL HYSTERECTOMY  2004  ovaries retained, DUB . TUBAL LIGATION  1985 HPI: Pt adm with PNA and resp failure. Intubated 2/23 and extubated 3/2. PMH includes bipolar disorder, CHF, CVA, lupus, CKD, HTN.  Subjective: pt alert and attentive, much less impulsive Assessment / Plan / Recommendation CHL IP CLINICAL IMPRESSIONS 09/10/2018 Clinical Impression Pt shows improvements from initial MBS in terms of airway protection as well as mentation. She is much less impulsive and following commands  well. She continues to have intermittent penetration of thin liquids, primarily due to impaired timing, but her laryngeal vestibule closure has improved, so she does not aspirate. Even when penetrates reach the true vocal folds, they are almost entirely ejected back out of the airway upon subsequent swallows. Although a chin tuck does not prevent penetration from occurring, it does keep it from reaching the vocal folds. Recommend upgrade to regular solids and thin liquids, with use of small, single sips and a chin tuck. Pt acknowledges her strategies and that she needs to use precaution as overall she is not yet back to her baseline.  SLP Visit Diagnosis Dysphagia, pharyngeal phase (R13.13) Attention and concentration deficit following -- Frontal lobe and executive function deficit following -- Impact on  safety and function Mild aspiration risk   CHL IP TREATMENT RECOMMENDATION 09/10/2018 Treatment Recommendations Therapy as outlined in treatment plan below   Prognosis 09/10/2018 Prognosis for Safe Diet Advancement Good Barriers to Reach Goals -- Barriers/Prognosis Comment -- CHL IP DIET RECOMMENDATION 09/10/2018 SLP Diet Recommendations Regular solids;Thin liquid Liquid Administration via Cup;Straw Medication Administration Whole meds with puree Compensations Slow rate;Small sips/bites;Chin tuck Postural Changes Seated upright at 90 degrees   CHL IP OTHER RECOMMENDATIONS 09/10/2018 Recommended Consults -- Oral Care Recommendations Oral care BID Other Recommendations --   CHL IP FOLLOW UP RECOMMENDATIONS 09/10/2018 Follow up Recommendations Skilled Nursing facility   [facility] IP FREQUENCY AND DURATION 09/10/2018 Speech Therapy Frequency (ACUTE ONLY) min 2x/week Treatment Duration 2 weeks      CHL IP ORAL PHASE 09/10/2018 Oral Phase WFL Oral - Pudding Teaspoon -- Oral - Pudding Cup -- Oral - Honey Teaspoon -- Oral - Honey Cup -- Oral - Nectar Teaspoon -- Oral - Nectar Cup -- Oral - Nectar Straw -- Oral - Thin Teaspoon -- Oral - Thin  Cup -- Oral - Thin Straw -- Oral - Puree -- Oral - Mech Soft -- Oral - Regular -- Oral - Multi-Consistency -- Oral - Pill -- Oral Phase - Comment --  CHL IP PHARYNGEAL PHASE 09/10/2018 Pharyngeal Phase Impaired Pharyngeal- Pudding Teaspoon -- Pharyngeal -- Pharyngeal- Pudding Cup -- Pharyngeal -- Pharyngeal- Honey Teaspoon -- Pharyngeal -- Pharyngeal- Honey Cup -- Pharyngeal -- Pharyngeal- Nectar Teaspoon -- Pharyngeal -- Pharyngeal- Nectar Cup NT Pharyngeal -- Pharyngeal- Nectar Straw NT Pharyngeal -- Pharyngeal- Thin Teaspoon -- Pharyngeal -- Pharyngeal- Thin Cup Reduced airway/laryngeal closure;Penetration/Aspiration during swallow Pharyngeal Material enters airway, remains ABOVE vocal cords then ejected out Pharyngeal- Thin Straw Reduced airway/laryngeal closure;Penetration/Aspiration during swallow Pharyngeal Material enters airway, CONTACTS cords and then ejected out Pharyngeal- Puree NT Pharyngeal -- Pharyngeal- Mechanical Soft -- Pharyngeal -- Pharyngeal- Regular WFL Pharyngeal -- Pharyngeal- Multi-consistency -- Pharyngeal -- Pharyngeal- Pill -- Pharyngeal -- Pharyngeal Comment --  CHL IP CERVICAL ESOPHAGEAL PHASE 09/10/2018 Cervical Esophageal Phase WFL Pudding Teaspoon -- Pudding Cup -- Honey Teaspoon -- Honey Cup -- Nectar Teaspoon -- Nectar Cup -- Nectar Straw -- Thin Teaspoon -- Thin Cup -- Thin Straw -- Puree -- Mechanical Soft -- Regular -- Multi-consistency -- Pill -- Cervical Esophageal Comment -- [name] Nix 09/10/2018, 2:51 PM  Pollyann Glen, M.A. CCC-SLP Acute Rehabilitation Services Pager [phone] Office [phone]                  Scheduled Meds: . Chlorhexidine Gluconate Cloth  6 each Topical Daily  . Chlorhexidine Gluconate Cloth  6 each Topical Daily  . cloNIDine  0.1 mg Per Tube BID  . escitalopram  20 mg Oral Daily  . feeding supplement (NEPRO CARB STEADY)  237 mL Oral BID BM  . folic acid  1 mg Oral Daily  . heparin  5,000 Units Subcutaneous Q8H  . hydrALAZINE  50 mg Oral  Q8H  . hydroxychloroquine  200 mg Oral Daily  . insulin aspart  0-15 Units Subcutaneous Q4H  . levothyroxine  50 mcg Oral Q0600  . mouth rinse  15 mL Mouth Rinse BID  . metoprolol tartrate  25 mg Oral BID  . pantoprazole  40 mg Oral QHS  . polyethylene glycol  17 g Oral BID  . predniSONE  5 mg Oral Q breakfast  . QUEtiapine  400 mg Oral QHS  . sennosides  5 mL Oral BID  . sodium chloride flush  10-40 mL  Intracatheter Q12H  . vitamin B-12  1,000 mcg Oral Daily   Continuous Infusions: . sodium chloride 75 mL/hr at 09/10/18 1500  . sodium chloride Stopped (09/06/18 1101)    Assessment & Plan:    1.Acute hypoxic respiratory failure/right lower lobe pneumonia: Present on admission. She is immunocompromised on Imuran and prednisone- s/p course of levofloxacin ended 3/1 (completed course of antibiotics)-Status post intubation and extubation. -Currently on room air  2.  Sepsis/metabolic encephalopathy: Present on admission secondary to problem #1. -Resolved  3.  Acute on chronic renal failure: Secondary to sepsis/ATN likely.  Appreciate nephrology evaluation and follow-up.  Patient off CVVD, creatinine on labs however today up at 2.59.  Continue to monitor and defer management to nephrology.  Patient did receive 1 dose of Lasix on March. -Concerning about patient's elevated creatinine, giving IV fluids overnight -Follow-up with BMP  4. Hilar lymphadenopathy/?  Mass with history of lupus: Follows pulmonary as outpatient and plan for PET scan by Dr. [name].  5.  History of lupus: Imuran on hold.  Received stress dose steroids in the initial hospital course.  Now resumed Plaquenil/home steroids.  6.  Hypothyroidism: Synthroid  7.  Hypertension: On clonidine, hydralazine and metoprolol.   8. Nicotine dependence: Counseled regarding cessation.  Had PFTs in 2018.  May have underlying COPD.  Neb treatments PRN  DVT prophylaxis: Heparin Code Status: Full code Family / Patient  Communication: Discussed with patient and all questions answered Disposition Plan: Likely home over the weekend once cleared by nephrology   LOS: 12 days    Time spent: 40 minutes    Sylvia Helms, MD [facility] Pager [phone]  If 7PM-7AM, please contact night-coverage [email or web address] Password [email or web address] 09/10/2018, 8:03 PM

## 2018-09-10 NOTE — Progress Notes (Signed)
Modified Barium Swallow Progress Note  Patient Details  Name: Anna [name] MRN: [id number] Date of Birth: [birth year]  Today's Date: 09/10/2018  Modified Barium Swallow completed.  Full report located under Chart Review in the Imaging Section.  Brief recommendations include the following:  Clinical Impression  Pt shows improvements from initial MBS in terms of airway protection as well as mentation. She is much less impulsive and following commands well. She continues to have intermittent penetration of thin liquids, primarily due to impaired timing, but her laryngeal vestibule closure has improved, so she does not aspirate. Even when penetrates reach the true vocal folds, they are almost entirely ejected back out of the airway upon subsequent swallows. Although a chin tuck does not prevent penetration from occurring, it does keep it from reaching the vocal folds. Recommend upgrade to regular solids and thin liquids, with use of small, single sips and a chin tuck. Pt acknowledges her strategies and that she needs to use precaution as overall she is not yet back to her baseline.    Swallow Evaluation Recommendations       SLP Diet Recommendations: Regular solids;Thin liquid   Liquid Administration via: Cup;Straw   Medication Administration: Whole meds with puree   Supervision: Patient able to self feed;Intermittent supervision to cue for compensatory strategies   Compensations: Slow rate;Small sips/bites;Chin tuck   Postural Changes: Seated upright at 90 degrees   Oral Care Recommendations: Oral care BID        Anna [name] 09/10/2018,2:49 PM   Pollyann Glen, M.A. [place] Acute Environmental education officer [phone] Office [phone]

## 2018-09-10 NOTE — Progress Notes (Signed)
  Speech Language Pathology Treatment: Dysphagia  Patient Details Name: Anna [name] MRN: [id number] DOB: 05/01/1961 Today's Date: 09/10/2018 Time: 1696-7893 SLP Time Calculation (min) (ACUTE ONLY): 12 min  Assessment / Plan / Recommendation Clinical Impression  Pt was seen to determine readiness for repeat MBS, although unfortunately she declined offer of PO trials. Treatment was primarily focused on education regarding aspiration risk, swallowing strategies, and results of prior MBS. Pt needed Mod cues for sustained attention. Pt was resistant to proceeding with repeat MBS because she did not like the barium flavor, but SLP reinforced that silent aspiration makes it challenging to identify at bedside. Pt ultimately agreed. Given that she may discharge home this weekend per chart and pt report, will proceed with MBS.   HPI HPI: Pt adm with PNA and resp failure. Intubated 2/23 and extubated 3/2. PMH includes bipolar disorder, CHF, CVA, lupus, CKD, HTN.      SLP Plan  MBS       Recommendations  Diet recommendations: Dysphagia 3 (mechanical soft);Nectar-thick liquid Liquids provided via: Cup;Straw Medication Administration: Whole meds with puree Supervision: Full supervision/cueing for compensatory strategies Compensations: Slow rate;Small sips/bites Postural Changes and/or Swallow Maneuvers: Seated upright 90 degrees;Upright 30-60 min after meal                Oral Care Recommendations: Oral care BID;Oral care prior to ice chip/H20 Follow up Recommendations: Skilled Nursing facility SLP Visit Diagnosis: Dysphagia, pharyngeal phase (R13.13) Plan: MBS       GO                Anna [name] 09/10/2018, 2:36 PM  Anna [name], M.A. [place] Acute Environmental education officer [phone] Office [phone]

## 2018-09-11 DIAGNOSIS — I10 Essential (primary) hypertension: Secondary | ICD-10-CM

## 2018-09-11 DIAGNOSIS — F201 Disorganized schizophrenia: Secondary | ICD-10-CM

## 2018-09-11 LAB — GLUCOSE, CAPILLARY
GLUCOSE-CAPILLARY: 113 mg/dL — AB (ref 70–99)
Glucose-Capillary: 84 mg/dL (ref 70–99)
Glucose-Capillary: 79 mg/dL (ref 70–99)
Glucose-Capillary: 101 mg/dL — ABNORMAL HIGH (ref 70–99)
Glucose-Capillary: 96 mg/dL (ref 70–99)

## 2018-09-11 LAB — RENAL FUNCTION PANEL
Albumin: 2.4 g/dL — ABNORMAL LOW (ref 3.5–5.0)
Anion gap: 10 (ref 5–15)
BUN: 48 mg/dL — ABNORMAL HIGH (ref 6–20)
CO2: 20 mmol/L — ABNORMAL LOW (ref 22–32)
Calcium: 8.7 mg/dL — ABNORMAL LOW (ref 8.9–10.3)
Chloride: 114 mmol/L — ABNORMAL HIGH (ref 98–111)
Creatinine, Ser: 2.72 mg/dL — ABNORMAL HIGH (ref 0.44–1.00)
GFR calc Af Amer: 21 mL/min — ABNORMAL LOW
GFR calc non Af Amer: 18 mL/min — ABNORMAL LOW
Glucose, Bld: 86 mg/dL (ref 70–99)
Phosphorus: 5.2 mg/dL — ABNORMAL HIGH (ref 2.5–4.6)
Potassium: 4.4 mmol/L (ref 3.5–5.1)
Sodium: 144 mmol/L (ref 135–145)

## 2018-09-11 LAB — CBC
HCT: 27.3 % — ABNORMAL LOW (ref 36.0–46.0)
HEMOGLOBIN: 8.9 g/dL — AB (ref 12.0–15.0)
MCH: 34.6 pg — ABNORMAL HIGH (ref 26.0–34.0)
MCHC: 32.6 g/dL (ref 30.0–36.0)
MCV: 106.2 fL — ABNORMAL HIGH (ref 80.0–100.0)
Platelets: 217 10*3/uL (ref 150–400)
RBC: 2.57 MIL/uL — AB (ref 3.87–5.11)
RDW: 18.3 % — ABNORMAL HIGH (ref 11.5–15.5)
WBC: 5.1 10*3/uL (ref 4.0–10.5)
nRBC: 0 % (ref 0.0–0.2)

## 2018-09-11 MED ORDER — LABETALOL HCL 200 MG PO TABS
200.0000 mg | ORAL_TABLET | Freq: Two times a day (BID) | ORAL | Status: DC
  Administered 2018-09-11 – 2018-09-12 (×3): 200 mg via ORAL
  Filled 2018-09-11 (×3): qty 1

## 2018-09-11 MED ORDER — INSULIN ASPART 100 UNIT/ML ~~LOC~~ SOLN: 0.0000 [IU] | Freq: Three times a day (TID) | SUBCUTANEOUS | Status: DC

## 2018-09-11 MED ORDER — SODIUM CHLORIDE 0.45 % IV SOLN
INTRAVENOUS | Status: DC
  Administered 2018-09-11: 11:00:00 via INTRAVENOUS

## 2018-09-11 NOTE — Progress Notes (Signed)
  Speech Language Pathology Treatment: Dysphagia  Patient Details Name: Anna [name] MRN: [id number] DOB: 08-10-1960 Today's Date: 09/11/2018 Time: 4917-9150 SLP Time Calculation (min) (ACUTE ONLY): 13 min  Assessment / Plan / Recommendation Clinical Impression  Pt recalled strategies recommended on MBS on previous date and implemented them while drinking thin liquids with Mod I. Her husband was present today, and education was provided about dysphagia treatment throughout hospital admission as well as recommendations for after discharge. Pt reports no subjective complaints of difficulty. Will f/u while she remains in the hospital.   HPI HPI: Pt adm with PNA and resp failure. Intubated 2/23 and extubated 3/2. PMH includes bipolar disorder, CHF, CVA, lupus, CKD, HTN.      SLP Plan  Continue with current plan of care       Recommendations  Diet recommendations: Regular;Thin liquid Liquids provided via: Cup;Straw Medication Administration: Whole meds with puree Supervision: Intermittent supervision to cue for compensatory strategies;Patient able to self feed Compensations: Slow rate;Small sips/bites;Chin tuck Postural Changes and/or Swallow Maneuvers: Seated upright 90 degrees;Upright 30-60 min after meal                Oral Care Recommendations: Oral care BID Follow up Recommendations: Home health SLP SLP Visit Diagnosis: Dysphagia, pharyngeal phase (R13.13) Plan: Continue with current plan of care       GO                [name] Hisae Decoursey 09/11/2018, 1:27 PM  Pollyann Glen, M.A. [place] Acute Environmental education officer [phone] Office [phone]

## 2018-09-11 NOTE — Progress Notes (Signed)
[facility] NEPHROLOGY PROGRESS NOTE  Assessment/ Plan: Pt is a 58 y.o. yo female with history of lupus on Imuran Plaquenil and prednisone admitted with sepsis due to pneumonia and ischemic ATN.  #AKI on CKD stage III in the setting of sepsis and ischemic ATN.  Patient required CVVHD until 2/29.  Patient is nonoliguric with fluctuation in serum creatinine level.  -Patient is nonoliguric and serum creatinine level mildly increasing to 2.7 today likely due to prerenal.  Patient has very poor oral intake and looks euvolemic on exam.  I am planning to continue IV fluid today.  Respiratory status is good.    - Avoid nephrotoxins.  After discharge from hospital patient will need to follow with Dr. Jimmy Footman at [facility].  #Sepsis due to pneumonia in immunocompromised patient.  #Altered mental status due to sepsis and hypoxia.  Mental status seems improving.  #Respiratory failure on ventilator per PCCM: Now extubated and doing well.  #Anemia: Monitor hemoglobin.  #SLE: On Plaquenil.  Imuran on hold.  Recommend to follow-up with rheumatology.   Subjective: Seen and examined.  Mental status improved.  Reports very poor oral intake.  Denies chest pain, shortness of breath, nausea vomiting. Objective Vital signs in last 24 hours: Vitals:   09/10/18 1514 09/10/18 1541 09/10/18 1941 09/11/18 0409  BP: (!) 170/99 (!) 168/78 (!) 163/87 121/72  Pulse:   70 75  Resp: (!) 25  (!) 34 19  Temp:   98.4 F (36.9 C) 98.4 F (36.9 C)  TempSrc:   Oral Oral  SpO2:   94% 99%  Weight:    76 kg  Height:       Weight change: -0.204 kg  Intake/Output Summary (Last 24 hours) at 09/11/2018 1047 Last data filed at 09/11/2018 0900 Gross per 24 hour  Intake 855.44 ml  Output 480 ml  Net 375.44 ml       Labs: Basic Metabolic Panel: Recent Labs  Lab 09/09/18 0401 09/10/18 0235 09/11/18 0317  NA 143 144 144  K 4.8 4.9 4.4  CL 109 117* 114*  CO2 23 20* 20*  GLUCOSE 76 94 86  BUN 69* 58*  48*  CREATININE 2.59* 2.69* 2.72*  CALCIUM 8.9 8.7* 8.7*  PHOS 5.0* 5.0* 5.2*   Liver Function Tests: Recent Labs  Lab 09/09/18 0401 09/10/18 0235 09/11/18 0317  ALBUMIN 2.4* 2.4* 2.4*   No results for input(s): LIPASE, AMYLASE in the last 168 hours. No results for input(s): AMMONIA in the last 168 hours. CBC: Recent Labs  Lab 09/05/18 0250 09/07/18 0607 09/08/18 0301 09/11/18 0317  WBC 10.4 7.8 5.6 5.1  HGB 12.1 10.5* 9.8* 8.9*  HCT 36.9 31.0* 28.5* 27.3*  MCV 102.2* 102.3* 102.9* 106.2*  PLT 197 193 189 217   Cardiac Enzymes: No results for input(s): CKTOTAL, CKMB, CKMBINDEX, TROPONINI in the last 168 hours. CBG: Recent Labs  Lab 09/10/18 1149 09/10/18 1622 09/10/18 2003 09/11/18 0407 09/11/18 0809  GLUCAP 115* 94 96 84 79    Iron Studies: No results for input(s): IRON, TIBC, TRANSFERRIN, FERRITIN in the last 72 hours. Studies/Results: Dg Swallowing Func-speech Pathology  Result Date: 09/10/2018 Objective Swallowing Evaluation: Type of Study: MBS-Modified Barium Swallow Study  Patient Details Name: Anna [name] MRN: [id number] Date of Birth: [birth year] Today's Date: 09/10/2018 Time: SLP Start Time (ACUTE ONLY): 1359 -SLP Stop Time (ACUTE ONLY): 1417 SLP Time Calculation (min) (ACUTE ONLY): 18 min Past Medical History: Past Medical History: Diagnosis Date . Acute CHF ([place]) 07/2016 . Depressive  disorder, not elsewhere classified  . Family history of adverse reaction to anesthesia   mother passed away during surgery . Hypertension  . Hypothyroid   Dr. Wilson Singer . Kidney stone  . Lupus (systemic lupus erythematosus) ([place])  . Migraine  . Pure hypercholesterolemia  . Stroke [facility])  Past Surgical History: Past Surgical History: Procedure Laterality Date . CESAREAN SECTION   . ECTOPIC PREGNANCY SURGERY    times 2 . JOINT REPLACEMENT   . LAPAROSCOPY    with rt salpingectomy . [place] SURGERY  2003 . TONSILLECTOMY   . TOTAL ABDOMINAL HYSTERECTOMY  2004  ovaries retained, DUB . TUBAL  LIGATION  1985 HPI: Pt adm with PNA and resp failure. Intubated 2/23 and extubated 3/2. PMH includes bipolar disorder, CHF, CVA, lupus, CKD, HTN.  Subjective: pt alert and attentive, much less impulsive Assessment / Plan / Recommendation CHL IP CLINICAL IMPRESSIONS 09/10/2018 Clinical Impression Pt shows improvements from initial MBS in terms of airway protection as well as mentation. She is much less impulsive and following commands well. She continues to have intermittent penetration of thin liquids, primarily due to impaired timing, but her laryngeal vestibule closure has improved, so she does not aspirate. Even when penetrates reach the true vocal folds, they are almost entirely ejected back out of the airway upon subsequent swallows. Although a chin tuck does not prevent penetration from occurring, it does keep it from reaching the vocal folds. Recommend upgrade to regular solids and thin liquids, with use of small, single sips and a chin tuck. Pt acknowledges her strategies and that she needs to use precaution as overall she is not yet back to her baseline.  SLP Visit Diagnosis Dysphagia, pharyngeal phase (R13.13) Attention and concentration deficit following -- Frontal lobe and executive function deficit following -- Impact on safety and function Mild aspiration risk   CHL IP TREATMENT RECOMMENDATION 09/10/2018 Treatment Recommendations Therapy as outlined in treatment plan below   Prognosis 09/10/2018 Prognosis for Safe Diet Advancement Good Barriers to Reach Goals -- Barriers/Prognosis Comment -- CHL IP DIET RECOMMENDATION 09/10/2018 SLP Diet Recommendations Regular solids;Thin liquid Liquid Administration via Cup;Straw Medication Administration Whole meds with puree Compensations Slow rate;Small sips/bites;Chin tuck Postural Changes Seated upright at 90 degrees   CHL IP OTHER RECOMMENDATIONS 09/10/2018 Recommended Consults -- Oral Care Recommendations Oral care BID Other Recommendations --   CHL IP FOLLOW UP  RECOMMENDATIONS 09/10/2018 Follow up Recommendations Skilled Nursing facility   [facility] IP FREQUENCY AND DURATION 09/10/2018 Speech Therapy Frequency (ACUTE ONLY) min 2x/week Treatment Duration 2 weeks      CHL IP ORAL PHASE 09/10/2018 Oral Phase WFL Oral - Pudding Teaspoon -- Oral - Pudding Cup -- Oral - Honey Teaspoon -- Oral - Honey Cup -- Oral - Nectar Teaspoon -- Oral - Nectar Cup -- Oral - Nectar Straw -- Oral - Thin Teaspoon -- Oral - Thin Cup -- Oral - Thin Straw -- Oral - Puree -- Oral - Mech Soft -- Oral - Regular -- Oral - Multi-Consistency -- Oral - Pill -- Oral Phase - Comment --  CHL IP PHARYNGEAL PHASE 09/10/2018 Pharyngeal Phase Impaired Pharyngeal- Pudding Teaspoon -- Pharyngeal -- Pharyngeal- Pudding Cup -- Pharyngeal -- Pharyngeal- Honey Teaspoon -- Pharyngeal -- Pharyngeal- Honey Cup -- Pharyngeal -- Pharyngeal- Nectar Teaspoon -- Pharyngeal -- Pharyngeal- Nectar Cup NT Pharyngeal -- Pharyngeal- Nectar Straw NT Pharyngeal -- Pharyngeal- Thin Teaspoon -- Pharyngeal -- Pharyngeal- Thin Cup Reduced airway/laryngeal closure;Penetration/Aspiration during swallow Pharyngeal Material enters airway, remains ABOVE vocal cords then ejected out  Pharyngeal- Thin Straw Reduced airway/laryngeal closure;Penetration/Aspiration during swallow Pharyngeal Material enters airway, CONTACTS cords and then ejected out Pharyngeal- Puree NT Pharyngeal -- Pharyngeal- Mechanical Soft -- Pharyngeal -- Pharyngeal- Regular WFL Pharyngeal -- Pharyngeal- Multi-consistency -- Pharyngeal -- Pharyngeal- Pill -- Pharyngeal -- Pharyngeal Comment --  CHL IP CERVICAL ESOPHAGEAL PHASE 09/10/2018 Cervical Esophageal Phase WFL Pudding Teaspoon -- Pudding Cup -- Honey Teaspoon -- Honey Cup -- Nectar Teaspoon -- Nectar Cup -- Nectar Straw -- Thin Teaspoon -- Thin Cup -- Thin Straw -- Puree -- Mechanical Soft -- Regular -- Multi-consistency -- Pill -- Cervical Esophageal Comment -- Anna [name] 09/10/2018, 2:51 PM  Pollyann Glen, M.A. CCC-SLP Acute  Rehabilitation Services Pager [phone] Office [phone]              Medications: Infusions: . sodium chloride    . sodium chloride Stopped (09/06/18 1101)    Scheduled Medications: . Chlorhexidine Gluconate Cloth  6 each Topical Daily  . Chlorhexidine Gluconate Cloth  6 each Topical Daily  . cloNIDine  0.1 mg Per Tube BID  . escitalopram  20 mg Oral Daily  . feeding supplement (NEPRO CARB STEADY)  237 mL Oral BID BM  . folic acid  1 mg Oral Daily  . heparin  5,000 Units Subcutaneous Q8H  . hydrALAZINE  50 mg Oral Q8H  . hydroxychloroquine  200 mg Oral Daily  . insulin aspart  0-15 Units Subcutaneous Q4H  . levothyroxine  50 mcg Oral Q0600  . mouth rinse  15 mL Mouth Rinse BID  . metoprolol tartrate  25 mg Oral BID  . pantoprazole  40 mg Oral QHS  . polyethylene glycol  17 g Oral BID  . predniSONE  5 mg Oral Q breakfast  . QUEtiapine  400 mg Oral QHS  . sennosides  5 mL Oral BID  . sodium chloride flush  10-40 mL Intracatheter Q12H  . vitamin B-12  1,000 mcg Oral Daily    have reviewed scheduled and prn medications.  Physical Exam: General: Not in distress, lying in bed comfortable Heart:RRR, s1s2 nl, no rubs Lungs: Clear bilateral, no wheezing or crackle Abdomen:soft, Non-tender, non-distended Extremities: No edema Neurology: Alert awake and following commands  Dron Prasad Bhandari 09/11/2018,10:47 AM  LOS: 13 days

## 2018-09-11 NOTE — Progress Notes (Signed)
PROGRESS NOTE    Anna [name]  FGH:[id number]  DOB: 07-26-1960  DOA: 08/29/2018 PCP: [name], MD  Brief Narrative:   58 year old female with history of lupus on immunosuppressants at baseline (Imuran, Plaquenil and prednisone) admitted on February 23 with pneumonia/sepsis/metabolic encephalopathy and associated respiratory failure requiring intubation.  Extubated and transferred to medical floor.  Hospital course complicated by acute renal failure secondary to ATN requiring CVVH.  Patient has been followed by nephrology during the hospital course and now off dialysis.  Of note, CT chest on January/20 showed hilar mass/lymphadenopathy and she follows pulmonary Dr. [name] as outpatient (although she states she was not aware of this finding).   Subjective: Denies having any cough, shortness of breath.  Patient is tolerating diet well.  Denies any nausea   Objective: Vitals:   09/10/18 1941 09/11/18 0409 09/11/18 1053 09/11/18 1100  BP: (!) 163/87 121/72 (!) 162/95 (!) 162/94  Pulse: 70 75  78  Resp: (!) 34 19  (!) 28  Temp: 98.4 F (36.9 C) 98.4 F (36.9 C)  98.2 F (36.8 C)  TempSrc: Oral Oral  Oral  SpO2: 94% 99%  93%  Weight:  76 kg    Height:        Intake/Output Summary (Last 24 hours) at 09/11/2018 1445 Last data filed at 09/11/2018 1055 Gross per 24 hour  Intake 622.44 ml  Output 480 ml  Net 142.44 ml   Filed Weights   09/09/18 0500 09/10/18 0618 09/11/18 0409  Weight: 77.4 kg 76.2 kg 76 kg    Physical Examination:  General exam: Appears calm and comfortable  Respiratory system: Clear to auscultation. Respiratory effort normal. Cardiovascular system: S1 & S2 heard, RRR. No JVD, murmurs, rubs, gallops or clicks. No pedal edema. Gastrointestinal system: Abdomen is nondistended, soft and nontender. No organomegaly or masses felt. Normal bowel sounds heard. Central nervous system: Alert and oriented. No focal neurological deficits. Extremities: Symmetric 5 x  5 power. Skin: No rashes, lesions or ulcers Psychiatry: Judgement and insight okay. Mood & affect appropriate.     Data Reviewed: I have personally reviewed following labs and imaging studies  CBC: Recent Labs  Lab 09/05/18 0250 09/07/18 0607 09/08/18 0301 09/11/18 0317  WBC 10.4 7.8 5.6 5.1  HGB 12.1 10.5* 9.8* 8.9*  HCT 36.9 31.0* 28.5* 27.3*  MCV 102.2* 102.3* 102.9* 106.2*  PLT 197 193 189 678   Basic Metabolic Panel: Recent Labs  Lab 09/05/18 0250  09/06/18 0422 09/07/18 0607 09/08/18 0301 09/09/18 0401 09/10/18 0235 09/11/18 0317  NA 137   < > 139 140 142 143 144 144  K 5.1   < > 4.3 5.0 5.3* 4.8 4.9 4.4  CL 103   < > 105 107 109 109 117* 114*  CO2 25   < > 23 24 24 23  20* 20*  GLUCOSE 135*   < > 126* 76 93 76 94 86  BUN 57*   < > 84* 88* 79* 69* 58* 48*  CREATININE 1.68*   < > 1.92* 2.20* 2.31* 2.59* 2.69* 2.72*  CALCIUM 9.4   < > 8.5* 9.1 8.7* 8.9 8.7* 8.7*  MG 2.3  --  1.9 1.7  --   --   --   --   PHOS 4.0   < > 5.4* 5.2*  --  5.0* 5.0* 5.2*   < > = values in this interval not displayed.   GFR: Estimated Creatinine Clearance: 23.2 mL/min (A) (by C-G formula based on SCr  of 2.72 mg/dL (H)). Liver Function Tests: Recent Labs  Lab 09/05/18 1545 09/06/18 0422 09/09/18 0401 09/10/18 0235 09/11/18 0317  ALBUMIN 2.2* 2.1* 2.4* 2.4* 2.4*   No results for input(s): LIPASE, AMYLASE in the last 168 hours. No results for input(s): AMMONIA in the last 168 hours. Coagulation Profile: No results for input(s): INR, PROTIME in the last 168 hours. Cardiac Enzymes: No results for input(s): CKTOTAL, CKMB, CKMBINDEX, TROPONINI in the last 168 hours. BNP (last 3 results) No results for input(s): PROBNP in the last 8760 hours. HbA1C: No results for input(s): HGBA1C in the last 72 hours. CBG: Recent Labs  Lab 09/10/18 1622 09/10/18 2003 09/11/18 0407 09/11/18 0809 09/11/18 1138  GLUCAP 94 96 84 79 113*   Lipid Profile: Recent Labs    09/10/18 1259  TRIG  95   Thyroid Function Tests: No results for input(s): TSH, T4TOTAL, FREET4, T3FREE, THYROIDAB in the last 72 hours. Anemia Panel: No results for input(s): VITAMINB12, FOLATE, FERRITIN, TIBC, IRON, RETICCTPCT in the last 72 hours. Sepsis Labs: No results for input(s): PROCALCITON, LATICACIDVEN in the last 168 hours.  No results found for this or any previous visit (from the past 240 hour(s)).    Radiology Studies: Dg Swallowing Func-speech Pathology  Result Date: 09/10/2018 Objective Swallowing Evaluation: Type of Study: MBS-Modified Barium Swallow Study  Patient Details Name: Anna [name] MRN: [id number] Date of Birth: 1961/01/12 Today's Date: 09/10/2018 Time: SLP Start Time (ACUTE ONLY): 1359 -SLP Stop Time (ACUTE ONLY): 1417 SLP Time Calculation (min) (ACUTE ONLY): 18 min Past Medical History: Past Medical History: Diagnosis Date . Acute CHF ([place]) 07/2016 . Depressive disorder, not elsewhere classified  . Family history of adverse reaction to anesthesia   mother passed away during surgery . Hypertension  . Hypothyroid   Dr. Wilson Singer . Kidney stone  . Lupus (systemic lupus erythematosus) ([place])  . Migraine  . Pure hypercholesterolemia  . Stroke [facility])  Past Surgical History: Past Surgical History: Procedure Laterality Date . CESAREAN SECTION   . ECTOPIC PREGNANCY SURGERY    times 2 . JOINT REPLACEMENT   . LAPAROSCOPY    with rt salpingectomy . [place] SURGERY  2003 . TONSILLECTOMY   . TOTAL ABDOMINAL HYSTERECTOMY  2004  ovaries retained, DUB . TUBAL LIGATION  1985 HPI: Pt adm with PNA and resp failure. Intubated 2/23 and extubated 3/2. PMH includes bipolar disorder, CHF, CVA, lupus, CKD, HTN.  Subjective: pt alert and attentive, much less impulsive Assessment / Plan / Recommendation CHL IP CLINICAL IMPRESSIONS 09/10/2018 Clinical Impression Pt shows improvements from initial MBS in terms of airway protection as well as mentation. She is much less impulsive and following commands well. She continues to  have intermittent penetration of thin liquids, primarily due to impaired timing, but her laryngeal vestibule closure has improved, so she does not aspirate. Even when penetrates reach the true vocal folds, they are almost entirely ejected back out of the airway upon subsequent swallows. Although a chin tuck does not prevent penetration from occurring, it does keep it from reaching the vocal folds. Recommend upgrade to regular solids and thin liquids, with use of small, single sips and a chin tuck. Pt acknowledges her strategies and that she needs to use precaution as overall she is not yet back to her baseline.  SLP Visit Diagnosis Dysphagia, pharyngeal phase (R13.13) Attention and concentration deficit following -- Frontal lobe and executive function deficit following -- Impact on safety and function Mild aspiration risk   CHL IP TREATMENT  RECOMMENDATION 09/10/2018 Treatment Recommendations Therapy as outlined in treatment plan below   Prognosis 09/10/2018 Prognosis for Safe Diet Advancement Good Barriers to Reach Goals -- Barriers/Prognosis Comment -- CHL IP DIET RECOMMENDATION 09/10/2018 SLP Diet Recommendations Regular solids;Thin liquid Liquid Administration via Cup;Straw Medication Administration Whole meds with puree Compensations Slow rate;Small sips/bites;Chin tuck Postural Changes Seated upright at 90 degrees   CHL IP OTHER RECOMMENDATIONS 09/10/2018 Recommended Consults -- Oral Care Recommendations Oral care BID Other Recommendations --   CHL IP FOLLOW UP RECOMMENDATIONS 09/10/2018 Follow up Recommendations Skilled Nursing facility   [facility] IP FREQUENCY AND DURATION 09/10/2018 Speech Therapy Frequency (ACUTE ONLY) min 2x/week Treatment Duration 2 weeks      CHL IP ORAL PHASE 09/10/2018 Oral Phase WFL Oral - Pudding Teaspoon -- Oral - Pudding Cup -- Oral - Honey Teaspoon -- Oral - Honey Cup -- Oral - Nectar Teaspoon -- Oral - Nectar Cup -- Oral - Nectar Straw -- Oral - Thin Teaspoon -- Oral - Thin Cup -- Oral - Thin Straw  -- Oral - Puree -- Oral - Mech Soft -- Oral - Regular -- Oral - Multi-Consistency -- Oral - Pill -- Oral Phase - Comment --  CHL IP PHARYNGEAL PHASE 09/10/2018 Pharyngeal Phase Impaired Pharyngeal- Pudding Teaspoon -- Pharyngeal -- Pharyngeal- Pudding Cup -- Pharyngeal -- Pharyngeal- Honey Teaspoon -- Pharyngeal -- Pharyngeal- Honey Cup -- Pharyngeal -- Pharyngeal- Nectar Teaspoon -- Pharyngeal -- Pharyngeal- Nectar Cup NT Pharyngeal -- Pharyngeal- Nectar Straw NT Pharyngeal -- Pharyngeal- Thin Teaspoon -- Pharyngeal -- Pharyngeal- Thin Cup Reduced airway/laryngeal closure;Penetration/Aspiration during swallow Pharyngeal Material enters airway, remains ABOVE vocal cords then ejected out Pharyngeal- Thin Straw Reduced airway/laryngeal closure;Penetration/Aspiration during swallow Pharyngeal Material enters airway, CONTACTS cords and then ejected out Pharyngeal- Puree NT Pharyngeal -- Pharyngeal- Mechanical Soft -- Pharyngeal -- Pharyngeal- Regular WFL Pharyngeal -- Pharyngeal- Multi-consistency -- Pharyngeal -- Pharyngeal- Pill -- Pharyngeal -- Pharyngeal Comment --  CHL IP CERVICAL ESOPHAGEAL PHASE 09/10/2018 Cervical Esophageal Phase WFL Pudding Teaspoon -- Pudding Cup -- Honey Teaspoon -- Honey Cup -- Nectar Teaspoon -- Nectar Cup -- Nectar Straw -- Thin Teaspoon -- Thin Cup -- Thin Straw -- Puree -- Mechanical Soft -- Regular -- Multi-consistency -- Pill -- Cervical Esophageal Comment -- [name] Nix 09/10/2018, 2:51 PM  Pollyann Glen, M.A. CCC-SLP Acute Rehabilitation Services Pager [phone] Office [phone]                  Scheduled Meds: . Chlorhexidine Gluconate Cloth  6 each Topical Daily  . Chlorhexidine Gluconate Cloth  6 each Topical Daily  . cloNIDine  0.1 mg Per Tube BID  . escitalopram  20 mg Oral Daily  . feeding supplement (NEPRO CARB STEADY)  237 mL Oral BID BM  . folic acid  1 mg Oral Daily  . heparin  5,000 Units Subcutaneous Q8H  . hydrALAZINE  50 mg Oral Q8H  . hydroxychloroquine   200 mg Oral Daily  . insulin aspart  0-15 Units Subcutaneous Q4H  . levothyroxine  50 mcg Oral Q0600  . mouth rinse  15 mL Mouth Rinse BID  . metoprolol tartrate  25 mg Oral BID  . pantoprazole  40 mg Oral QHS  . polyethylene glycol  17 g Oral BID  . predniSONE  5 mg Oral Q breakfast  . QUEtiapine  400 mg Oral QHS  . sennosides  5 mL Oral BID  . sodium chloride flush  10-40 mL Intracatheter Q12H  . vitamin B-12  1,000 mcg Oral Daily  Continuous Infusions: . sodium chloride 75 mL/hr at 09/11/18 1058  . sodium chloride Stopped (09/06/18 1101)    Assessment & Plan:    ##Acute hypoxic respiratory failure/right lower lobe pneumonia: Present on admission.  - immunocompromised on Imuran and prednisone - s/p course of levofloxacin ended 3/1 (completed course of antibiotics) -Status post intubation and extubation. -Currently on room air  2.  Sepsis/metabolic encephalopathy:  Present on admission secondary to problem #1. -Resolved  3.  Acute on chronic renal failure:  Secondary to sepsis/ATN likely.  Appreciate nephrology evaluation and follow-up.  - Patient off CVVD, creatinine on labs however today up at 2.59.   -Continue to monitor and defer management to nephrology.  Patient did receive 1 dose of Lasix on March. -Concerning about patient's elevated creatinine, giving IV fluids overnight, mild trend up of creatinine -Follow-up with BMP  4. Hilar lymphadenopathy/?  [place] with history of lupus: - Follows pulmonary as outpatient and plan for PET scan by Dr. [name].  5.  History of lupus:  -Imuran on hold.   -Received stress dose steroids in the initial hospital course.   -Now resumed Plaquenil/home steroids.  6.  Hypothyroidism: - Synthroid  7.  Hypertension:  -On clonidine, hydralazine and metoprolol. -Poorly controlled -Discontinue clonidine and metoprolol as this could cause bradycardia -Started the patient on labetalol 200 mg twice daily  8. Nicotine dependence:    Counseled regarding cessation.   Had PFTs in 2018.   May have underlying COPD.   Neb treatments PRN  DVT prophylaxis: Heparin Code Status: Full code Family / Patient Communication: Discussed with patient and all questions answered Disposition Plan: Likely home over the weekend once cleared by nephrology   LOS: 13 days    Time spent: 40 minutes    Kahmari Herard, MD [facility] Pager [phone]  If 7PM-7AM, please contact night-coverage [email or web address] Password [email or web address] 09/11/2018, 2:45 PM

## 2018-09-12 DIAGNOSIS — G9341 Metabolic encephalopathy: Secondary | ICD-10-CM

## 2018-09-12 LAB — GLUCOSE, CAPILLARY: Glucose-Capillary: 111 mg/dL — ABNORMAL HIGH (ref 70–99)

## 2018-09-12 LAB — CBC
HCT: 27.2 % — ABNORMAL LOW (ref 36.0–46.0)
Hemoglobin: 8.7 g/dL — ABNORMAL LOW (ref 12.0–15.0)
MCH: 34 pg (ref 26.0–34.0)
MCHC: 32 g/dL (ref 30.0–36.0)
MCV: 106.3 fL — ABNORMAL HIGH (ref 80.0–100.0)
Platelets: 213 10*3/uL (ref 150–400)
RBC: 2.56 MIL/uL — ABNORMAL LOW (ref 3.87–5.11)
RDW: 18 % — ABNORMAL HIGH (ref 11.5–15.5)
WBC: 5.5 10*3/uL (ref 4.0–10.5)
nRBC: 0 % (ref 0.0–0.2)

## 2018-09-12 LAB — BASIC METABOLIC PANEL
Anion gap: 9 (ref 5–15)
BUN: 38 mg/dL — AB (ref 6–20)
CO2: 20 mmol/L — ABNORMAL LOW (ref 22–32)
Calcium: 8.3 mg/dL — ABNORMAL LOW (ref 8.9–10.3)
Chloride: 113 mmol/L — ABNORMAL HIGH (ref 98–111)
Creatinine, Ser: 2.77 mg/dL — ABNORMAL HIGH (ref 0.44–1.00)
GFR calc Af Amer: 21 mL/min — ABNORMAL LOW (ref >60)
GFR calc non Af Amer: 18 mL/min — ABNORMAL LOW (ref >60)
Glucose, Bld: 105 mg/dL — ABNORMAL HIGH (ref 70–99)
Potassium: 4.2 mmol/L (ref 3.5–5.1)
Sodium: 142 mmol/L (ref 135–145)

## 2018-09-12 MED ORDER — ESCITALOPRAM OXALATE 20 MG PO TABS: 20.0000 mg | ORAL_TABLET | Freq: Every day | ORAL | 0 refills | Status: DC

## 2018-09-12 MED ORDER — NIFEDIPINE ER 30 MG PO TB24: 30.0000 mg | ORAL_TABLET | Freq: Every day | ORAL | 0 refills | Status: AC

## 2018-09-12 MED ORDER — LABETALOL HCL 200 MG PO TABS: 200.0000 mg | ORAL_TABLET | Freq: Two times a day (BID) | ORAL | 0 refills | Status: AC

## 2018-09-12 MED ORDER — SENNOSIDES 8.8 MG/5ML PO SYRP: 5.0000 mL | ORAL_SOLUTION | Freq: Two times a day (BID) | ORAL | 0 refills | Status: AC

## 2018-09-12 MED ORDER — PREDNISONE 5 MG PO TABS: 5.0000 mg | ORAL_TABLET | Freq: Every day | ORAL | 0 refills | Status: AC

## 2018-09-12 NOTE — Progress Notes (Signed)
[facility] NEPHROLOGY PROGRESS NOTE  Assessment/ Plan: Pt is a 58 y.o. yo female with history of lupus on Imuran Plaquenil and prednisone admitted with sepsis due to pneumonia and ischemic ATN.  #AKI on CKD stage III in the setting of sepsis and ischemic ATN.  Patient required CVVHD until 2/29.  -Serum creatinine level is stable at 2.7.  Patient is euvolemic and clinically improved.  Okay to discharge from renal perspective.  Recommend repeat renal panel in a week.  Patient will need to follow-up with Dr. Jimmy Footman at [facility].    #Sepsis due to pneumonia in immunocompromised patient.  #Altered mental status due to sepsis and hypoxia.  Mental status improved.  #Respiratory failure on ventilator per PCCM: Now extubated and doing well.  #Anemia: Monitor hemoglobin.  #SLE: Resume home medication.  Recommend to follow-up with rheumatology. I have discussed with primary team.  Subjective: Seen and examined.  Feeling much better.  Denied headache, dizziness, nausea, vomiting, chest pain, shortness of breath.  Mental status has improved.  Eager to go home today. Objective Vital signs in last 24 hours: Vitals:   09/11/18 1533 09/11/18 1927 09/12/18 0606 09/12/18 0816  BP: (!) 148/87 (!) 153/95 140/83 (!) 142/86  Pulse: 70 68 71 82  Resp:  (!) 28 (!) 25   Temp:  97.9 F (36.6 C) 97.9 F (36.6 C) 98.2 F (36.8 C)  TempSrc:  Oral Oral Oral  SpO2:  96% 92% 96%  Weight:   76.6 kg   Height:       Weight change: 0.567 kg  Intake/Output Summary (Last 24 hours) at 09/12/2018 1134 Last data filed at 09/12/2018 1100 Gross per 24 hour  Intake 1516.61 ml  Output -  Net 1516.61 ml       Labs: Basic Metabolic Panel: Recent Labs  Lab 09/09/18 0401 09/10/18 0235 09/11/18 0317 09/12/18 0412  NA 143 144 144 142  K 4.8 4.9 4.4 4.2  CL 109 117* 114* 113*  CO2 23 20* 20* 20*  GLUCOSE 76 94 86 105*  BUN 69* 58* 48* 38*  CREATININE 2.59* 2.69* 2.72* 2.77*  CALCIUM  8.9 8.7* 8.7* 8.3*  PHOS 5.0* 5.0* 5.2*  --    Liver Function Tests: Recent Labs  Lab 09/09/18 0401 09/10/18 0235 09/11/18 0317  ALBUMIN 2.4* 2.4* 2.4*   No results for input(s): LIPASE, AMYLASE in the last 168 hours. No results for input(s): AMMONIA in the last 168 hours. CBC: Recent Labs  Lab 09/07/18 0607 09/08/18 0301 09/11/18 0317 09/12/18 0412  WBC 7.8 5.6 5.1 5.5  HGB 10.5* 9.8* 8.9* 8.7*  HCT 31.0* 28.5* 27.3* 27.2*  MCV 102.3* 102.9* 106.2* 106.3*  PLT 193 189 217 213   Cardiac Enzymes: No results for input(s): CKTOTAL, CKMB, CKMBINDEX, TROPONINI in the last 168 hours. CBG: Recent Labs  Lab 09/11/18 0809 09/11/18 1138 09/11/18 1630 09/11/18 2121 09/12/18 0611  GLUCAP 79 113* 101* 96 111*    Iron Studies: No results for input(s): IRON, TIBC, TRANSFERRIN, FERRITIN in the last 72 hours. Studies/Results: Dg Swallowing Func-speech Pathology  Result Date: 09/10/2018 Objective Swallowing Evaluation: Type of Study: MBS-Modified Barium Swallow Study  Patient Details Name: VICENTA OLDS MRN: [id number] Date of Birth: January 07, 1961 Today's Date: 09/10/2018 Time: SLP Start Time (ACUTE ONLY): 1359 -SLP Stop Time (ACUTE ONLY): 1417 SLP Time Calculation (min) (ACUTE ONLY): 18 min Past Medical History: Past Medical History: Diagnosis Date . Acute CHF ([place]) 07/2016 . Depressive disorder, not elsewhere classified  . Family  history of adverse reaction to anesthesia   mother passed away during surgery . Hypertension  . Hypothyroid   Dr. Wilson Singer . Kidney stone  . Lupus (systemic lupus erythematosus) ([place])  . Migraine  . Pure hypercholesterolemia  . Stroke [facility])  Past Surgical History: Past Surgical History: Procedure Laterality Date . CESAREAN SECTION   . ECTOPIC PREGNANCY SURGERY    times 2 . JOINT REPLACEMENT   . LAPAROSCOPY    with rt salpingectomy . [place] SURGERY  2003 . TONSILLECTOMY   . TOTAL ABDOMINAL HYSTERECTOMY  2004  ovaries retained, DUB . TUBAL LIGATION  1985 HPI: Pt adm  with PNA and resp failure. Intubated 2/23 and extubated 3/2. PMH includes bipolar disorder, CHF, CVA, lupus, CKD, HTN.  Subjective: pt alert and attentive, much less impulsive Assessment / Plan / Recommendation CHL IP CLINICAL IMPRESSIONS 09/10/2018 Clinical Impression Pt shows improvements from initial MBS in terms of airway protection as well as mentation. She is much less impulsive and following commands well. She continues to have intermittent penetration of thin liquids, primarily due to impaired timing, but her laryngeal vestibule closure has improved, so she does not aspirate. Even when penetrates reach the true vocal folds, they are almost entirely ejected back out of the airway upon subsequent swallows. Although a chin tuck does not prevent penetration from occurring, it does keep it from reaching the vocal folds. Recommend upgrade to regular solids and thin liquids, with use of small, single sips and a chin tuck. Pt acknowledges her strategies and that she needs to use precaution as overall she is not yet back to her baseline.  SLP Visit Diagnosis Dysphagia, pharyngeal phase (R13.13) Attention and concentration deficit following -- Frontal lobe and executive function deficit following -- Impact on safety and function Mild aspiration risk   CHL IP TREATMENT RECOMMENDATION 09/10/2018 Treatment Recommendations Therapy as outlined in treatment plan below   Prognosis 09/10/2018 Prognosis for Safe Diet Advancement Good Barriers to Reach Goals -- Barriers/Prognosis Comment -- CHL IP DIET RECOMMENDATION 09/10/2018 SLP Diet Recommendations Regular solids;Thin liquid Liquid Administration via Cup;Straw Medication Administration Whole meds with puree Compensations Slow rate;Small sips/bites;Chin tuck Postural Changes Seated upright at 90 degrees   CHL IP OTHER RECOMMENDATIONS 09/10/2018 Recommended Consults -- Oral Care Recommendations Oral care BID Other Recommendations --   CHL IP FOLLOW UP RECOMMENDATIONS 09/10/2018 Follow up  Recommendations Skilled Nursing facility   [facility] IP FREQUENCY AND DURATION 09/10/2018 Speech Therapy Frequency (ACUTE ONLY) min 2x/week Treatment Duration 2 weeks      CHL IP ORAL PHASE 09/10/2018 Oral Phase WFL Oral - Pudding Teaspoon -- Oral - Pudding Cup -- Oral - Honey Teaspoon -- Oral - Honey Cup -- Oral - Nectar Teaspoon -- Oral - Nectar Cup -- Oral - Nectar Straw -- Oral - Thin Teaspoon -- Oral - Thin Cup -- Oral - Thin Straw -- Oral - Puree -- Oral - Mech Soft -- Oral - Regular -- Oral - Multi-Consistency -- Oral - Pill -- Oral Phase - Comment --  CHL IP PHARYNGEAL PHASE 09/10/2018 Pharyngeal Phase Impaired Pharyngeal- Pudding Teaspoon -- Pharyngeal -- Pharyngeal- Pudding Cup -- Pharyngeal -- Pharyngeal- Honey Teaspoon -- Pharyngeal -- Pharyngeal- Honey Cup -- Pharyngeal -- Pharyngeal- Nectar Teaspoon -- Pharyngeal -- Pharyngeal- Nectar Cup NT Pharyngeal -- Pharyngeal- Nectar Straw NT Pharyngeal -- Pharyngeal- Thin Teaspoon -- Pharyngeal -- Pharyngeal- Thin Cup Reduced airway/laryngeal closure;Penetration/Aspiration during swallow Pharyngeal Material enters airway, remains ABOVE vocal cords then ejected out Pharyngeal- Thin Straw Reduced airway/laryngeal closure;Penetration/Aspiration during  swallow Pharyngeal Material enters airway, CONTACTS cords and then ejected out Pharyngeal- Puree NT Pharyngeal -- Pharyngeal- Mechanical Soft -- Pharyngeal -- Pharyngeal- Regular WFL Pharyngeal -- Pharyngeal- Multi-consistency -- Pharyngeal -- Pharyngeal- Pill -- Pharyngeal -- Pharyngeal Comment --  CHL IP CERVICAL ESOPHAGEAL PHASE 09/10/2018 Cervical Esophageal Phase WFL Pudding Teaspoon -- Pudding Cup -- Honey Teaspoon -- Honey Cup -- Nectar Teaspoon -- Nectar Cup -- Nectar Straw -- Thin Teaspoon -- Thin Cup -- Thin Straw -- Puree -- Mechanical Soft -- Regular -- Multi-consistency -- Pill -- Cervical Esophageal Comment -- [name] Nix 09/10/2018, 2:51 PM  Pollyann Glen, M.A. CCC-SLP Acute Rehabilitation Services Pager [phone]  Office [phone]              Medications: Infusions: . sodium chloride Stopped (09/06/18 1101)    Scheduled Medications: . Chlorhexidine Gluconate Cloth  6 each Topical Daily  . Chlorhexidine Gluconate Cloth  6 each Topical Daily  . escitalopram  20 mg Oral Daily  . feeding supplement (NEPRO CARB STEADY)  237 mL Oral BID BM  . folic acid  1 mg Oral Daily  . hydrALAZINE  50 mg Oral Q8H  . hydroxychloroquine  200 mg Oral Daily  . insulin aspart  0-15 Units Subcutaneous TID WC  . labetalol  200 mg Oral BID  . levothyroxine  50 mcg Oral Q0600  . mouth rinse  15 mL Mouth Rinse BID  . pantoprazole  40 mg Oral QHS  . polyethylene glycol  17 g Oral BID  . predniSONE  5 mg Oral Q breakfast  . QUEtiapine  400 mg Oral QHS  . sennosides  5 mL Oral BID  . sodium chloride flush  10-40 mL Intracatheter Q12H  . vitamin B-12  1,000 mcg Oral Daily    have reviewed scheduled and prn medications.  Physical Exam: General: Not in distress, comfortable  Heart:RRR, s1s2 nl, no rubs Lungs: Clear bilateral, no wheezing or crackle Abdomen:soft, Non-tender, non-distended Extremities: No edema Neurology: Alert awake and following commands.  Mental status improved.  Lyzbeth Genrich Prasad Eryck Negron 09/12/2018,11:34 AM  LOS: 14 days

## 2018-09-12 NOTE — Care Management Note (Signed)
Case Management Note  Patient Details  Name: Anna [name] MRN: [id number] Date of Birth: 1960/09/23  Subjective/Objective:      Pt declines SNF and plans to return home with [facility] PT, SLP, and RN.  Pt states she has all necessary DME and is on RA per RN.              Action/Plan: Referral called to [facility] with [place].  [place] contact is spouse, Ludwig Clarks at [phone].  Expected Discharge Date:  09/12/18               Expected Discharge Plan:  Skilled Nursing Facility  In-House Referral:  Clinical Social Work  Discharge planning Services  CM Consult  Post Acute Care Choice:  Home Health Choice offered to:  Patient, Spouse  DME Arranged:  N/A DME Agency:  NA  HH Arranged:  PT, RN, Speech Therapy HH Agency:  [facility]  Status of Service:  Completed, signed off  If discussed at [place] of Stay Meetings, dates discussed:    Additional Comments:  Claudie Leach, RN 09/12/2018, 12:23 PM

## 2018-09-12 NOTE — Progress Notes (Signed)
Patient discharged.  Home health arranged through [place] up by Claudie Leach, RN (Case Manager).  Vital signs obtained and were stable, peripheral IV removed, CCMD called.  Patient education performed with regards to discharge summary/AVS with a review of medications and appointments.  All questions addressed.  Patient transported to [facility] by wheelchair by Luetta Nutting, RN.

## 2018-09-13 NOTE — Discharge Summary (Signed)
Physician Discharge Summary  [name] SFK:[id number] DOB: [birth year] DOA: 08/29/2018  PCP: [name], MD  Admit date: 08/29/2018 Discharge date: 09/13/2018  Time spent: 37 minutes  Recommendations for Outpatient Follow-up:  1. Follow-up with primary care physician in 1 week 2. Follow-up with nephrology in 1 week 3. Discharged home with home health  Discharge Diagnoses:  Active Problems:   Acute respiratory failure [facility])   Discharge Condition: Stable  Diet recommendation: Renal  Filed Weights   09/10/18 0618 09/11/18 0409 09/12/18 0606  Weight: 76.2 kg 76 kg 76.6 kg    History of present illness:  58 year old female with history of lupus on immunosuppressants at baseline (Imuran, Plaquenil and prednisone) admitted on February 23 with pneumonia/sepsis/metabolic encephalopathy and associated respiratory failure requiring intubation.  Extubated and transferred to medical floor.  Hospital course complicated by acute renal failure secondary to ATN requiring CVVH.  [name] has been followed by nephrology during the hospital course and now off dialysis.  Of note, CT chest on January/20 showed hilar mass/lymphadenopathy and she follows pulmonary Dr. [name] as outpatient  Hospital Course:  ##Acute hypoxic respiratory failure/right lower lobe pneumonia: Present on admission.  - immunocompromised on Imuran and prednisone - s/p course of levofloxacin ended 3/1(completed course of antibiotics) -Status post intubation and extubation. -Currently on room air.  ##Sepsis secondary to healthcare associated pneumonia.  Was treated with antibiotics.  Cultures came back to be negative.  ##metabolic encephalopathy: Secondary to pneumonia, severe sepsis, hypoxic respiratory failure, acute renal failure.  Improved at the time of the discharge  ## Acute on chronic renal failure:  Secondary to sepsis/ATN likely.  Nephrology followed during the hospital stay.  [name] required CRRT.   Currently  off CVVD.  creatinine on labs stabilized at 2.7.  Nephrology cleared [name] to be discharged home.  Recommended [name] to follow-up with nephrology in 1 to 2 weeks.  Follow-up with primary care physician in 1 week with repeat BMP.  There was concern about prerenal, however did not improve with IV fluids.  ## Hilar lymphadenopathy/?  Mass with history of lupus: - Follows pulmonary as outpatient and plan for PET scan by Dr. [name].  ##  History of lupus: -Imuran on hold.  -Received stress dose steroids in the initial hospital course.   -Now resumed Plaquenil/home steroids.  ## Hypothyroidism: - Synthroid  ## Hypertension:  -Is continued clonidine, metoprolol was started on labetalol with significant improvement with the blood pressure continued with hydralazine.  Holding losartan considering [name]'s acute kidney failure  ##Nicotine dependence:  Counseled regarding cessation.   Had PFTs in 2018.   May have underlying COPD.   Neb treatments PRN  Procedures:  Intubation  CRRT  Echocardiogram  Consultations:  Nephrology  Critical care  Discharge Exam: Vitals:   09/12/18 0606 09/12/18 0816  BP: 140/83 (!) 142/86  Pulse: 71 82  Resp: (!) 25   Temp: 97.9 F (36.6 C) 98.2 F (36.8 C)  SpO2: 92% 96%    General exam: Appears calm and comfortable  Respiratory system: Clear to auscultation. Respiratory effort normal. Cardiovascular system: S1 & S2 heard, RRR. No JVD, murmurs, rubs, gallops or clicks. No pedal edema. Gastrointestinal system: Abdomen is nondistended, soft and nontender. No organomegaly or masses felt. Normal bowel sounds heard. Central nervous system: Alert and oriented. No focal neurological deficits. Extremities: Symmetric 5 x 5 power. Skin: No rashes, lesions or ulcers Psychiatry: Judgement and insight okay. Mood & affect appropriate.   Discharge Instructions   Discharge Instructions  Diet - low sodium heart healthy   Complete by:   As directed    Discharge instructions   Complete by:  As directed    F/u with PCP in 1 week and recheck BMP F/U with your kidney doctor in 2 weeks   Face-to-face encounter (required for Medicare/Medicaid patients)   Complete by:  As directed    I Carlos Quackenbush certify that this [name] is under my care and that I, or a nurse practitioner or physician's assistant working with me, had a face-to-face encounter that meets the physician face-to-face encounter requirements with this [name] on 09/12/2018. The encounter with the [name] was in whole, or in part for the following medical condition(s) which is the primary reason for home health care (List medical condition):   The encounter with the [name] was in whole, or in part, for the following medical condition, which is the primary reason for home health care:  Debility   I certify that, based on my findings, the following services are medically necessary home health services:   Nursing Physical therapy     Reason for Medically Necessary Home Health Services:  Therapy- Personnel officer, Public librarian   My clinical findings support the need for the above services:  Unsafe ambulation due to balance issues   Further, I certify that my clinical findings support that this [name] is homebound due to:  Unsafe ambulation due to balance issues   Home Health   Complete by:  As directed    To provide the following care/treatments:   PT OT RN     Increase activity slowly   Complete by:  As directed      Allergies as of 09/12/2018   No Known Allergies     Medication List    STOP taking these medications   azaTHIOprine 50 MG tablet Commonly known as:  IMURAN   diazepam 10 MG tablet Commonly known as:  VALIUM   diphenhydrAMINE 25 MG tablet Commonly known as:  BENADRYL   hydrALAZINE 50 MG tablet Commonly known as:  APRESOLINE   losartan 100 MG tablet Commonly known as:  COZAAR   metoprolol succinate 50 MG 24 hr  tablet Commonly known as:  TOPROL-XL   metoprolol tartrate 50 MG tablet Commonly known as:  LOPRESSOR   Potassium Chloride ER 20 MEQ Tbcr   Zantac 75 75 MG tablet Generic drug:  ranitidine     TAKE these medications   aspirin EC 325 MG tablet Take 325 mg by mouth daily.   atorvastatin 20 MG tablet Commonly known as:  LIPITOR Take 20 mg by mouth daily.   calcitRIOL 0.25 MCG capsule Commonly known as:  ROCALTROL Take 0.25 mcg by mouth daily.   escitalopram 20 MG tablet Commonly known as:  LEXAPRO Take 1 tablet (20 mg total) by mouth daily. What changed:    medication strength  how much to take   ferrous sulfate 325 (65 FE) MG tablet Take 1 tablet (325 mg total) by mouth daily.   folic acid 1 MG tablet Commonly known as:  FOLVITE Take 1 mg by mouth daily.   hydroxychloroquine 200 MG tablet Commonly known as:  PLAQUENIL Take 200 mg by mouth daily.   labetalol 200 MG tablet Commonly known as:  NORMODYNE Take 1 tablet (200 mg total) by mouth 2 (two) times daily.   levothyroxine 50 MCG tablet Commonly known as:  SYNTHROID, LEVOTHROID Take 50 mcg by mouth daily before breakfast.   NIFEdipine 30 MG 24  hr tablet Commonly known as:  ADALAT CC Take 1 tablet (30 mg total) by mouth daily. What changed:    medication strength  how much to take   omeprazole 20 MG capsule Commonly known as:  PRILOSEC Take 20 mg by mouth daily.   ondansetron 4 MG tablet Commonly known as:  ZOFRAN Take 1 tablet (4 mg total) by mouth every 6 (six) hours as needed for nausea.   polyethylene glycol packet Commonly known as:  MIRALAX / GLYCOLAX Take 17 g by mouth 2 (two) times daily. What changed:    when to take this  reasons to take this   predniSONE 5 MG tablet Commonly known as:  DELTASONE Take 1 tablet (5 mg total) by mouth daily with breakfast. What changed:    medication strength  how much to take  how to take this  when to take this  additional  instructions   QUEtiapine 400 MG tablet Commonly known as:  SEROQUEL Take 1 tablet (400 mg total) by mouth at bedtime.   sennosides 8.8 MG/5ML syrup Commonly known as:  SENOKOT Take 5 mLs by mouth 2 (two) times daily.   sodium bicarbonate 650 MG tablet Take 1,300 mg by mouth 2 (two) times daily.   vitamin B-12 1000 MCG tablet Commonly known as:  CYANOCOBALAMIN Take 1,000 mcg by mouth daily.      No Known Allergies Follow-up Information    [name], MD. Schedule an appointment as soon as possible for a visit in 1 week(s).   Specialty:  Family Medicine Why:  Get BMP Contact information: [address] [place] [address] [phone]        DeterdingJeneen Rinks, MD. Schedule an appointment as soon as possible for a visit in 2 week(s).   Specialty:  Nephrology Contact information: [name] [address] [phone]        Rheumatology. Schedule an appointment as soon as possible for a visit in 1 week(s).            The results of significant diagnostics from this hospitalization (including imaging, microbiology, ancillary and laboratory) are listed below for reference.    Significant Diagnostic Studies: US Renal  Result Date: 08/30/2018 CLINICAL DATA:  Acute renal failure.  Hypertension, kidney stones. EXAM: RENAL / URINARY TRACT ULTRASOUND COMPLETE COMPARISON:  None. FINDINGS: Right Kidney: Renal measurements: 12.3 x 5.8 x 5 cm = volume: 186 mL. Renal cortex is diffusely echogenic. Small cysts within the upper pole, largest measuring 1.6 cm. No suspicious mass or hydronephrosis. Left Kidney: Renal measurements: 10.4 x 5.4 x 5 cm = volume: 148 mL. Renal cortex is diffusely echogenic. Mildly complicated cyst within the lower pole measures 1.8 cm. No suspicious mass or hydronephrosis. Bladder: Appears normal for degree of bladder distention. Ascites noted within the abdomen and pelvis. IMPRESSION: 1. Both kidneys are diffusely echogenic  indicating medical renal disease. No suspicious mass or hydronephrosis bilaterally. 2. Ascites. Electronically Signed   By: Franki Cabot M.D.   On: 08/30/2018 20:03   Dg Chest Port 1 View  Result Date: 09/05/2018 CLINICAL DATA:  Acute respiratory failure EXAM: PORTABLE CHEST 1 VIEW COMPARISON:  09/04/2018 FINDINGS: Endotracheal tube and gastric catheter are again seen and stable. Right jugular central line is again noted and stable. Lungs are well aerated bilaterally. Persistent right basilar interstitial opacities are noted. Slight increase in left basilar atelectasis is seen when compared with the prior exam. IMPRESSION: Stable opacities in the right lung base with  slight increase in left basilar atelectasis. Electronically Signed   By: Inez Catalina M.D.   On: 09/05/2018 08:19   Dg Chest Port 1 View  Result Date: 09/04/2018 CLINICAL DATA:  Acute respiratory failure with hypoxia. Follow-up exam. EXAM: PORTABLE CHEST 1 VIEW COMPARISON:  09/03/2018 and older exams. FINDINGS: Continued improvement. Decreased opacity at the left lung base with the left hemidiaphragm not clearly visible. Persistent interstitial type opacities are noted most evident in the right mid lung. No new lung abnormalities. Endotracheal tube, nasogastric tube and dual lumen right internal jugular central venous line are stable. No pneumothorax. IMPRESSION: 1. Continued improvement with improved aeration most evident at the left lung base. No new abnormalities. 2. Support apparatus is stable. Electronically Signed   By: Lajean Manes M.D.   On: 09/04/2018 08:57   Dg Chest Port 1 View  Result Date: 09/03/2018 CLINICAL DATA:  58 year old female with a history respiratory failure EXAM: PORTABLE CHEST 1 VIEW COMPARISON:  09/01/2018, 08/31/2018 CT 06/15/2018 FINDINGS: Cardiomediastinal silhouette unchanged. Endotracheal tube unchanged, terminating 3.5 cm above the carina. Unchanged right IJ sheath. Unchanged gastric tube terminating out of  the field of view. Improving aeration at the right lung base. Persisting interlobular septal thickening. No pneumothorax. Blunting of the left costophrenic angle with partial obscuration of the left hemidiaphragm. Changes of centrilobular and paraseptal emphysema. IMPRESSION: Improving aeration, particularly at the right lung base, with persisting mixed interstitial and airspace disease. Likely trace left pleural effusion. Emphysema. Unchanged endotracheal tube, right IJ sheath, and gastric tube. Electronically Signed   By: Corrie Mckusick D.O.   On: 09/03/2018 09:33   Dg Chest Port 1 View  Result Date: 09/01/2018 CLINICAL DATA:  Check endotracheal tube placement EXAM: PORTABLE CHEST 1 VIEW COMPARISON:  08/31/2018 FINDINGS: Cardiac shadow remains enlarged. Endotracheal tube and gastric catheter are again seen and stable. Temporary dialysis catheter via the right jugular is again seen and stable. No pneumothorax is noted. Increasing consolidation in the right base is noted. Increasing consolidation but to a lesser degree is noted in the left base as well. No bony abnormality is noted. IMPRESSION: Increasing right basilar consolidation with stable left basilar infiltrates. Electronically Signed   By: Inez Catalina M.D.   On: 09/01/2018 08:27   Dg Chest Port 1 View  Result Date: 08/31/2018 CLINICAL DATA:  Status post dialysis catheter placement today. EXAM: PORTABLE CHEST 1 VIEW COMPARISON:  Single-view of the chest earlier today. FINDINGS: New right IJ approach double lumen central venous catheter is in place with its tip in the mid to lower superior vena cava. ETT and NG tube are unchanged. No pneumothorax. Right worse than left effusions and airspace disease persist. Heart size is enlarged. IMPRESSION: Right IJ approach central venous catheter tip is in the mid to lower superior vena cava. No pneumothorax. No change in right greater than left effusions and airspace disease. Electronically Signed   By: Inge Rise M.D.   On: 08/31/2018 14:32   Dg Chest Port 1 View  Result Date: 08/31/2018 CLINICAL DATA:  ETT present,,resp failure EXAM: PORTABLE CHEST 1 VIEW COMPARISON:  Chest x-rays dated 08/30/2018 and 08/29/2018 FINDINGS: Endotracheal tube is adequately positioned with tip just below the level of the clavicles, approximately 5 cm above the carina. Additional coiled catheter overlying the endotracheal tube, of uncertain etiology. Enteric tube passes below the diaphragm. Heart size and mediastinal contours appear stable. Persistent RIGHT perihilar opacity, not significantly changed in the interval, superimposed on a background of emphysematous change. No  pleural effusion or pneumothorax seen. IMPRESSION: 1. Endotracheal tube is adequately positioned with tip just below the level of the clavicles, approximately 5 cm above the carina. 2. Additional coiled catheter overlying the endotracheal tube, of uncertain etiology. Recommend clinical correlation. 3. Persistent RIGHT perihilar opacity, not significantly changed in the interval, suspected pneumonia superimposed on a background of emphysematous change. Recent chest CT of 07/26/2018 suggested the possibility of RIGHT perihilar mass with postobstructive infiltrate. Electronically Signed   By: Franki Cabot M.D.   On: 08/31/2018 09:07   Dg Chest Port 1 View  Result Date: 08/30/2018 CLINICAL DATA:  Intubated, pneumonia EXAM: PORTABLE CHEST 1 VIEW COMPARISON:  Chest radiograph from one day prior. FINDINGS: Endotracheal tube tip is 3.7 cm above the carina. Enteric tube enters stomach with the tip not seen on this image. Stable cardiomediastinal silhouette with top-normal heart size. No pneumothorax. Probable small left pleural effusion, stable. No significant right pleural effusion. Stable extensive patchy consolidation in the right perihilar and bilateral retrocardiac lungs. IMPRESSION: 1. Well-positioned support structures. 2. Stable small left pleural effusion.  3. Stable extensive patchy right perihilar and bilateral retrocardiac consolidation compatible with a combination of multilobar pneumonia and atelectasis. Electronically Signed   By: Ilona Sorrel M.D.   On: 08/30/2018 09:09   Dg Chest Portable 1 View  Result Date: 08/29/2018 CLINICAL DATA:  Status post repositioning of endotracheal tube. EXAM: PORTABLE CHEST 1 VIEW COMPARISON:  Single-view of the chest approximately 6 minutes prior to this exam. FINDINGS: Endotracheal tube has been pulled back with its tip now in good position 2.5 cm above the carina. No other change. IMPRESSION: ET tube now in good position.  No other change. Electronically Signed   By: Inge Rise M.D.   On: 08/29/2018 13:37   Dg Chest Portable 1 View  Result Date: 08/29/2018 CLINICAL DATA:  Status post endotracheal tube placement. EXAM: PORTABLE CHEST 1 VIEW COMPARISON:  Single-view of the chest earlier today. FINDINGS: New NG tube is in place with its tip in the stomach. Endotracheal tube tip is approximately 0.9 cm above the carina. Extensive airspace disease in the right chest is unchanged. Cardiomegaly noted. IMPRESSION: NG tube tip is 0.9 cm above the carina. The tube should be withdrawn 1-2 cm. NG tube in good position. No change in airspace disease on the right. Electronically Signed   By: Inge Rise M.D.   On: 08/29/2018 13:36   Dg Chest Port 1 View  Result Date: 08/29/2018 CLINICAL DATA:  Fever and altered mental status. EXAM: PORTABLE CHEST 1 VIEW COMPARISON:  07/27/2018 FINDINGS: The cardiac silhouette is enlarged and globular, with more prominent size than on the prior radiograph dated 07/27/2018. These findings are concerning for a pericardial effusion. There is no evidence of pneumothorax. Right mid lung confluent airspace opacity. Small right pleural effusion. Osseous structures are without acute abnormality. Soft tissues are grossly normal. IMPRESSION: 1. Enlarged and globular cardiac silhouette, with more  prominent size than on the prior radiograph dated 07/27/2014. These findings are concerning for a pericardial effusion. 2. Right mid lung confluent airspace opacity likely represents developing pneumonia. 3. Small right pleural effusion. 4. Critical Value/emergent results were called by telephone at the time of interpretation on 08/29/2018 at 8:02 am to Dr. Jola Schmidt , who verbally acknowledged these results. Electronically Signed   By: Fidela Salisbury M.D.   On: 08/29/2018 08:02   Dg Abd Portable 1v  Result Date: 08/31/2018 CLINICAL DATA:  Orogastric tube placement. EXAM: PORTABLE ABDOMEN - 1  VIEW COMPARISON:  None. FINDINGS: Enteric tube is well positioned within the stomach with tip directed towards the stomach pylorus/duodenal bulb. Moderate amount of bowel gas and stool within the visualized portion of the colon. No evidence of free intraperitoneal air. IMPRESSION: Enteric tube well positioned within the stomach with tip directed towards the stomach pylorus/duodenal bulb. Electronically Signed   By: Franki Cabot M.D.   On: 08/31/2018 09:08   Dg Abd Portable 1 View  Result Date: 08/29/2018 CLINICAL DATA:  OG tube placement. EXAM: PORTABLE ABDOMEN - 1 VIEW COMPARISON:  None. FINDINGS: 1252 hours. Diffuse gaseous bowel distention noted. NG tube tip overlies the proximal stomach. Telemetry leads evident. IMPRESSION: NG tube tip overlies the proximal stomach with diffuse gaseous bowel distention. Electronically Signed   By: Misty Stanley M.D.   On: 08/29/2018 13:36   Dg Swallowing Func-speech Pathology  Result Date: 09/10/2018 Objective Swallowing Evaluation: Type of Study: MBS-Modified Barium Swallow Study  [name] Details Name: Anna [name] MRN: [id number] Date of Birth: 1961/01/27 Today's Date: 09/10/2018 Time: SLP Start Time (ACUTE ONLY): 1359 -SLP Stop Time (ACUTE ONLY): 1417 SLP Time Calculation (min) (ACUTE ONLY): 18 min Past Medical History: Past Medical History: Diagnosis Date . Acute CHF  ([place]) 07/2016 . Depressive disorder, not elsewhere classified  . Family history of adverse reaction to anesthesia   mother passed away during surgery . Hypertension  . Hypothyroid   Dr. Wilson Singer . Kidney stone  . Lupus (systemic lupus erythematosus) ([place])  . Migraine  . Pure hypercholesterolemia  . Stroke [facility])  Past Surgical History: Past Surgical History: Procedure Laterality Date . CESAREAN SECTION   . ECTOPIC PREGNANCY SURGERY    times 2 . JOINT REPLACEMENT   . LAPAROSCOPY    with rt salpingectomy . [place] SURGERY  2003 . TONSILLECTOMY   . TOTAL ABDOMINAL HYSTERECTOMY  2004  ovaries retained, DUB . TUBAL LIGATION  1985 HPI: Pt adm with PNA and resp failure. Intubated 2/23 and extubated 3/2. PMH includes bipolar disorder, CHF, CVA, lupus, CKD, HTN.  Subjective: pt alert and attentive, much less impulsive Assessment / Plan / Recommendation CHL IP CLINICAL IMPRESSIONS 09/10/2018 Clinical Impression Pt shows improvements from initial MBS in terms of airway protection as well as mentation. She is much less impulsive and following commands well. She continues to have intermittent penetration of thin liquids, primarily due to impaired timing, but her laryngeal vestibule closure has improved, so she does not aspirate. Even when penetrates reach the true vocal folds, they are almost entirely ejected back out of the airway upon subsequent swallows. Although a chin tuck does not prevent penetration from occurring, it does keep it from reaching the vocal folds. Recommend upgrade to regular solids and thin liquids, with use of small, single sips and a chin tuck. Pt acknowledges her strategies and that she needs to use precaution as overall she is not yet back to her baseline.  SLP Visit Diagnosis Dysphagia, pharyngeal phase (R13.13) Attention and concentration deficit following -- Frontal lobe and executive function deficit following -- Impact on safety and function Mild aspiration risk   CHL IP TREATMENT RECOMMENDATION  09/10/2018 Treatment Recommendations Therapy as outlined in treatment plan below   Prognosis 09/10/2018 Prognosis for Safe Diet Advancement Good Barriers to Reach Goals -- Barriers/Prognosis Comment -- CHL IP DIET RECOMMENDATION 09/10/2018 SLP Diet Recommendations Regular solids;Thin liquid Liquid Administration via Cup;Straw Medication Administration Whole meds with puree Compensations Slow rate;Small sips/bites;Chin tuck Postural Changes Seated upright at 90 degrees   CHL  IP OTHER RECOMMENDATIONS 09/10/2018 Recommended Consults -- Oral Care Recommendations Oral care BID Other Recommendations --   CHL IP FOLLOW UP RECOMMENDATIONS 09/10/2018 Follow up Recommendations Skilled Nursing facility   [facility] IP FREQUENCY AND DURATION 09/10/2018 Speech Therapy Frequency (ACUTE ONLY) min 2x/week Treatment Duration 2 weeks      CHL IP ORAL PHASE 09/10/2018 Oral Phase WFL Oral - Pudding Teaspoon -- Oral - Pudding Cup -- Oral - Honey Teaspoon -- Oral - Honey Cup -- Oral - Nectar Teaspoon -- Oral - Nectar Cup -- Oral - Nectar Straw -- Oral - Thin Teaspoon -- Oral - Thin Cup -- Oral - Thin Straw -- Oral - Puree -- Oral - Mech Soft -- Oral - Regular -- Oral - Multi-Consistency -- Oral - Pill -- Oral Phase - Comment --  CHL IP PHARYNGEAL PHASE 09/10/2018 Pharyngeal Phase Impaired Pharyngeal- Pudding Teaspoon -- Pharyngeal -- Pharyngeal- Pudding Cup -- Pharyngeal -- Pharyngeal- Honey Teaspoon -- Pharyngeal -- Pharyngeal- Honey Cup -- Pharyngeal -- Pharyngeal- Nectar Teaspoon -- Pharyngeal -- Pharyngeal- Nectar Cup NT Pharyngeal -- Pharyngeal- Nectar Straw NT Pharyngeal -- Pharyngeal- Thin Teaspoon -- Pharyngeal -- Pharyngeal- Thin Cup Reduced airway/laryngeal closure;Penetration/Aspiration during swallow Pharyngeal Material enters airway, remains ABOVE vocal cords then ejected out Pharyngeal- Thin Straw Reduced airway/laryngeal closure;Penetration/Aspiration during swallow Pharyngeal Material enters airway, CONTACTS cords and then ejected out  Pharyngeal- Puree NT Pharyngeal -- Pharyngeal- Mechanical Soft -- Pharyngeal -- Pharyngeal- Regular WFL Pharyngeal -- Pharyngeal- Multi-consistency -- Pharyngeal -- Pharyngeal- Pill -- Pharyngeal -- Pharyngeal Comment --  CHL IP CERVICAL ESOPHAGEAL PHASE 09/10/2018 Cervical Esophageal Phase WFL Pudding Teaspoon -- Pudding Cup -- Honey Teaspoon -- Honey Cup -- Nectar Teaspoon -- Nectar Cup -- Nectar Straw -- Thin Teaspoon -- Thin Cup -- Thin Straw -- Puree -- Mechanical Soft -- Regular -- Multi-consistency -- Pill -- Cervical Esophageal Comment -- [name] Nix 09/10/2018, 2:51 PM  Pollyann Glen, M.A. CCC-SLP Acute Rehabilitation Services Pager [phone] Office [phone]             Dg Swallowing Func-speech Pathology  Result Date: 09/07/2018 Note populated for Ellis Savage, Student SLP Objective Swallowing Evaluation: Type of Study: MBS-Modified Barium Swallow Study  [name] Details Name: Anna [name] MRN: [id number] Date of Birth: 1961/07/04 Today's Date: 09/07/2018 Time: SLP Start Time (ACUTE ONLY): 1108 -SLP Stop Time (ACUTE ONLY): 1140 SLP Time Calculation (min) (ACUTE ONLY): 32 min Past Medical History: Past Medical History: Diagnosis Date . Acute CHF ([place]) 07/2016 . Depressive disorder, not elsewhere classified  . Family history of adverse reaction to anesthesia   mother passed away during surgery . Hypertension  . Hypothyroid   Dr. Wilson Singer . Kidney stone  . Lupus (systemic lupus erythematosus) ([place])  . Migraine  . Pure hypercholesterolemia  . Stroke [facility])  Past Surgical History: Past Surgical History: Procedure Laterality Date . CESAREAN SECTION   . ECTOPIC PREGNANCY SURGERY    times 2 . JOINT REPLACEMENT   . LAPAROSCOPY    with rt salpingectomy . [place] SURGERY  2003 . TONSILLECTOMY   . TOTAL ABDOMINAL HYSTERECTOMY  2004  ovaries retained, DUB . TUBAL LIGATION  1985 HPI: Pt adm with PNA and resp failure. Intubated 2/23 and extubated 3/2. PMH includes bipolar disorder, CHF, CVA, lupus, CKD, HTN.  Subjective:  pt alert, asking repetitive questions Assessment / Plan / Recommendation CHL IP CLINICAL IMPRESSIONS 09/07/2018 Clinical Impression Pt presents with moderate pharyngeal dysphagia following prolonged intubation and exacerbated by cognitive status. Swallow dysfunction marked by suspected incomplete glottal  closure, decreased laryngeal sensation, inconsistent timing of swallow initiation and impulsive consumption of liquids resulting in silent aspiration of thin liquids. Max verbal cues to elicit clearance of aspirate with cough resulted in partial clearance. Chin tuck did not consistently prevent aspiration. Nectar thick liquids prevented aspiration and puree/regular solid POs consumed with no aspiration or excessive residue. Pt belched intermittently throughout testing and coughed x3 after. Given prolonged intubation, subsequent decrease in laryngeal sensation and cognitive status, recommend dys 3, nectar liquid diet with full supervision to take small bites/sips and consume at a slow rate. Pt may have ice chips intermittently throughout day in betwen meals after thorough oral care. SLP to f/u to for diet tolerance and provide strategies and exercises for safe swallowing.  SLP Visit Diagnosis Dysphagia, pharyngeal phase (R13.13) Attention and concentration deficit following -- Frontal lobe and executive function deficit following -- Impact on safety and function Moderate aspiration risk   CHL IP TREATMENT RECOMMENDATION 09/07/2018 Treatment Recommendations Therapy as outlined in treatment plan below   Prognosis 09/07/2018 Prognosis for Safe Diet Advancement Good Barriers to Reach Goals -- Barriers/Prognosis Comment -- CHL IP DIET RECOMMENDATION 09/07/2018 SLP Diet Recommendations Dysphagia 3 (Mech soft) solids;Nectar thick liquid Liquid Administration via Cup Medication Administration Whole meds with puree Compensations Slow rate;Small sips/bites Postural Changes Remain semi-upright after after feeds/meals (Comment);Seated  upright at 90 degrees   CHL IP OTHER RECOMMENDATIONS 09/07/2018 Recommended Consults -- Oral Care Recommendations -- Other Recommendations Have oral suction available   CHL IP FOLLOW UP RECOMMENDATIONS 09/07/2018 Follow up Recommendations Skilled Nursing facility   [facility] IP FREQUENCY AND DURATION 09/07/2018 Speech Therapy Frequency (ACUTE ONLY) min 2x/week Treatment Duration 2 weeks      CHL IP ORAL PHASE 09/07/2018 Oral Phase WFL Oral - Pudding Teaspoon -- Oral - Pudding Cup -- Oral - Honey Teaspoon -- Oral - Honey Cup -- Oral - Nectar Teaspoon -- Oral - Nectar Cup -- Oral - Nectar Straw -- Oral - Thin Teaspoon -- Oral - Thin Cup -- Oral - Thin Straw -- Oral - Puree -- Oral - Mech Soft -- Oral - Regular -- Oral - Multi-Consistency -- Oral - Pill -- Oral Phase - Comment --  CHL IP PHARYNGEAL PHASE 09/07/2018 Pharyngeal Phase Impaired Pharyngeal- Pudding Teaspoon -- Pharyngeal -- Pharyngeal- Pudding Cup -- Pharyngeal -- Pharyngeal- Honey Teaspoon -- Pharyngeal -- Pharyngeal- Honey Cup -- Pharyngeal -- Pharyngeal- Nectar Teaspoon -- Pharyngeal -- Pharyngeal- Nectar Cup Delayed swallow initiation-vallecula;Delayed swallow initiation-pyriform sinuses;Penetration/Aspiration during swallow;Compensatory strategies attempted (with notebox) Pharyngeal Material enters airway, remains ABOVE vocal cords then ejected out Pharyngeal- Nectar Straw Delayed swallow initiation-vallecula;Delayed swallow initiation-pyriform sinuses;Penetration/Aspiration during swallow;Compensatory strategies attempted (with notebox) Pharyngeal Material enters airway, remains ABOVE vocal cords then ejected out Pharyngeal- Thin Teaspoon -- Pharyngeal -- Pharyngeal- Thin Cup Delayed swallow initiation-vallecula;Delayed swallow initiation-pyriform sinuses;Penetration/Aspiration during swallow;Compensatory strategies attempted (with notebox) Pharyngeal Material enters airway, passes BELOW cords without attempt by [name] to eject out (silent aspiration) Pharyngeal-  Thin Straw Delayed swallow initiation-vallecula;Delayed swallow initiation-pyriform sinuses;Penetration/Aspiration during swallow;Compensatory strategies attempted (with notebox) Pharyngeal Material enters airway, passes BELOW cords without attempt by [name] to eject out (silent aspiration);Material enters airway, CONTACTS cords and not ejected out Pharyngeal- Puree WFL Pharyngeal -- Pharyngeal- Mechanical Soft -- Pharyngeal -- Pharyngeal- Regular WFL Pharyngeal -- Pharyngeal- Multi-consistency -- Pharyngeal -- Pharyngeal- Pill -- Pharyngeal -- Pharyngeal Comment --  CHL IP CERVICAL ESOPHAGEAL PHASE 09/07/2018 Cervical Esophageal Phase (No Data) Pudding Teaspoon -- Pudding Cup -- Honey Teaspoon -- Honey Cup -- Nectar Teaspoon -- Nectar  Cup -- [employer] -- Thin Teaspoon -- Thin Cup -- Thin Straw -- Puree -- Mechanical Soft -- Regular -- Multi-consistency -- Pill -- Cervical Esophageal Comment -- [name] Nix 09/07/2018, 1:33 PM  Pollyann Glen, M.A. [facility] Pager [phone] Office [phone]              Microbiology: No results found for this or any previous visit (from the past 240 hour(s)).   Labs: Basic Metabolic Panel: Recent Labs  Lab 09/07/18 0607 09/08/18 0301 09/09/18 0401 09/10/18 0235 09/11/18 0317 09/12/18 0412  NA 140 142 143 144 144 142  K 5.0 5.3* 4.8 4.9 4.4 4.2  CL 107 109 109 117* 114* 113*  CO2 24 24 23  20* 20* 20*  GLUCOSE 76 93 76 94 86 105*  BUN 88* 79* 69* 58* 48* 38*  CREATININE 2.20* 2.31* 2.59* 2.69* 2.72* 2.77*  CALCIUM 9.1 8.7* 8.9 8.7* 8.7* 8.3*  MG 1.7  --   --   --   --   --   PHOS 5.2*  --  5.0* 5.0* 5.2*  --    Liver Function Tests: Recent Labs  Lab 09/09/18 0401 09/10/18 0235 09/11/18 0317  ALBUMIN 2.4* 2.4* 2.4*   No results for input(s): LIPASE, AMYLASE in the last 168 hours. No results for input(s): AMMONIA in the last 168 hours. CBC: Recent Labs  Lab 09/07/18 0607 09/08/18 0301 09/11/18 0317 09/12/18 0412   WBC 7.8 5.6 5.1 5.5  HGB 10.5* 9.8* 8.9* 8.7*  HCT 31.0* 28.5* 27.3* 27.2*  MCV 102.3* 102.9* 106.2* 106.3*  PLT 193 189 217 213   Cardiac Enzymes: No results for input(s): CKTOTAL, CKMB, CKMBINDEX, TROPONINI in the last 168 hours. BNP: BNP (last 3 results) Recent Labs    06/15/18 2048 08/29/18 0942  BNP 45.6 392.9*    ProBNP (last 3 results) No results for input(s): PROBNP in the last 8760 hours.  CBG: Recent Labs  Lab 09/11/18 0809 09/11/18 1138 09/11/18 1630 09/11/18 2121 09/12/18 0611  GLUCAP 79 113* 101* 96 111*       Signed:  Eimi Viney MD.  [facility] 09/13/2018, 4:16 PM

## 2018-09-14 ENCOUNTER — Telehealth: Payer: Self-pay

## 2018-09-14 NOTE — Telephone Encounter (Signed)
lmtcb x1 pt.  Pt has appointment 09/15/18 to go over PET scan.  Pt did not receive pet scan, she was in hospital.  Was calling to reschedule appt until after she has the scan.

## 2018-09-15 ENCOUNTER — Other Ambulatory Visit: Payer: Self-pay

## 2018-09-15 ENCOUNTER — Encounter: Payer: Self-pay | Admitting: Primary Care

## 2018-09-15 ENCOUNTER — Ambulatory Visit (INDEPENDENT_AMBULATORY_CARE_PROVIDER_SITE_OTHER): Payer: BLUE CROSS/BLUE SHIELD | Admitting: Primary Care

## 2018-09-15 VITALS — BP 158/92 | HR 76 | Ht 67.0 in | Wt 167.0 lb

## 2018-09-15 DIAGNOSIS — R9389 Abnormal findings on diagnostic imaging of other specified body structures: Secondary | ICD-10-CM | POA: Diagnosis not present

## 2018-09-15 DIAGNOSIS — F17200 Nicotine dependence, unspecified, uncomplicated: Secondary | ICD-10-CM | POA: Diagnosis not present

## 2018-09-15 DIAGNOSIS — J9601 Acute respiratory failure with hypoxia: Secondary | ICD-10-CM

## 2018-09-15 MED ORDER — TIOTROPIUM BROMIDE MONOHYDRATE 1.25 MCG/ACT IN AERS: 2.0000 | INHALATION_SPRAY | Freq: Every day | RESPIRATORY_TRACT | 2 refills | Status: AC

## 2018-09-15 NOTE — Patient Instructions (Addendum)
Congratulations on quitting smoking!!!  Is the best thing you could do for your overall health   Please reschedule PET scan, needs follow-up with Dr. [name] after to review results  Needs repeat chest x-ray in 2 weeks follow-up pneumonia  Make sure you follow-up with nephrology (Dr.Deterding) and primary care as recommended on hospital discharge  Continue prednisone 5 mg daily  Trial inhaler called Spiriva - takes this once a day every day (will give you a sample, if it helps your breathing let us know and we will continue it)

## 2018-09-15 NOTE — Progress Notes (Signed)
@Patient  ID: Anna [name], female    DOB: 09/12/1960, 58 y.o.   MRN: [id number]  Chief Complaint  Patient presents with   Follow-up    SOB with exertion,     Referring provider: [name], MD  HPI: 58 year old female, current every day smoker.  Past medical history significant for Lupus, hypothyroidism, hypertension, nicotine dependence, acute on chronic renal failure, [place] lymphadenopathy.  Patient of Dr. [name], last seen in office on August 24, 2018.  09/15/2018 Patient presents today for follow-up visit, patient was supposed to have PET scan however this was canceled due to recent hospitalization. CT chest January 2020 showed possible right hilar mass with right hilar adenopathy.   She feels pretty good. Great news is that she quit smoking. She is up moving around, states that she gets winded at times. She has some mild shortness of breath recovers with rest. Ambulates with cane. Has physical therapy and nursing at home with [facility]. Denies cough, wheezing.  She is afebrile.   PFTs 04/29/17 - FVC 3.07 (104%], FEV1 2.32 [99%], F/F 76, TLC 93%, DLCO 38% Minimal obstruction, severe diffusion defect.  No Known Allergies  Immunization History  Administered Date(s) Administered   Influenza Split 04/05/2012, 03/02/2013, 04/03/2017   Influenza, Seasonal, Injecte, Preservative Fre 04/07/2015, 04/06/2016   Influenza,inj,Quad PF,6+ Mos 03/10/2013   Pneumococcal Conjugate-13 03/10/2013   Pneumococcal Polysaccharide-23 03/02/2013   Tdap 07/07/2004, 07/07/2005, 11/07/2015    Past Medical History:  Diagnosis Date   Acute CHF ([place]) 07/2016   Depressive disorder, not elsewhere classified    Family history of adverse reaction to anesthesia    mother passed away during surgery   Hypertension    Hypothyroid    Dr. Wilson Singer   Kidney stone    Lupus (systemic lupus erythematosus) ([place])    Migraine    Pure hypercholesterolemia    Stroke ([place])      Tobacco History: Social History   Tobacco Use  Smoking Status Former Smoker   Packs/day: 0.50   Years: 35.00   Pack years: 17.50   Types: Cigarettes   Last attempt to quit: 08/29/2018   Years since quitting: 0.0  Smokeless Tobacco Never Used  Tobacco Comment   Continued cessation encouraged   Counseling given: Not Answered Comment: Continued cessation encouraged   Outpatient Medications Prior to Visit  Medication Sig Dispense Refill   aspirin EC 325 MG tablet Take 325 mg by mouth daily.     atorvastatin (LIPITOR) 20 MG tablet Take 20 mg by mouth daily.     calcitRIOL (ROCALTROL) 0.25 MCG capsule Take 0.25 mcg by mouth daily.  6   escitalopram (LEXAPRO) 20 MG tablet Take 1 tablet (20 mg total) by mouth daily. 30 tablet 0   ferrous sulfate 325 (65 FE) MG tablet Take 1 tablet (325 mg total) by mouth daily. 30 tablet 3   folic acid (FOLVITE) 1 MG tablet Take 1 mg by mouth daily.     hydroxychloroquine (PLAQUENIL) 200 MG tablet Take 200 mg by mouth daily.   0   labetalol (NORMODYNE) 200 MG tablet Take 1 tablet (200 mg total) by mouth 2 (two) times daily. 60 tablet 0   levothyroxine (SYNTHROID, LEVOTHROID) 50 MCG tablet Take 50 mcg by mouth daily before breakfast.     NIFEdipine (ADALAT CC) 30 MG 24 hr tablet Take 1 tablet (30 mg total) by mouth daily. 30 tablet 0   omeprazole (PRILOSEC) 20 MG capsule Take 20 mg by mouth daily.  ondansetron (ZOFRAN) 4 MG tablet Take 1 tablet (4 mg total) by mouth every 6 (six) hours as needed for nausea. 20 tablet 0   polyethylene glycol (MIRALAX / GLYCOLAX) packet Take 17 g by mouth 2 (two) times daily. (Patient taking differently: Take 17 g by mouth daily as needed (constipation). )  0   predniSONE (DELTASONE) 5 MG tablet Take 1 tablet (5 mg total) by mouth daily with breakfast. 20 tablet 0   QUEtiapine (SEROQUEL) 400 MG tablet Take 1 tablet (400 mg total) by mouth at bedtime. 90 tablet 0   sennosides (SENOKOT) 8.8 MG/5ML  syrup Take 5 mLs by mouth 2 (two) times daily. 240 mL 0   sodium bicarbonate 650 MG tablet Take 1,300 mg by mouth 2 (two) times daily.     vitamin B-12 (CYANOCOBALAMIN) 1000 MCG tablet Take 1,000 mcg by mouth daily.     No facility-administered medications prior to visit.     Review of Systems  Review of Systems  Constitutional: Negative.   HENT: Negative.   Respiratory: Positive for shortness of breath. Negative for cough and wheezing.     Physical Exam  BP (!) 158/92 (BP Location: Left Arm, Cuff Size: Normal)    Pulse 76    Ht 5\' 7"  (1.702 m)    Wt 167 lb (75.8 kg)    LMP 07/07/2002 (Exact Date)    SpO2 93%    BMI 26.16 kg/m  Physical Exam Constitutional:      Appearance: She is well-developed.  HENT:     Head: Normocephalic and atraumatic.  Eyes:     Pupils: Pupils are equal, round, and reactive to light.  Neck:     Musculoskeletal: Normal range of motion and neck supple.  Cardiovascular:     Rate and Rhythm: Normal rate and regular rhythm.     Heart sounds: Normal heart sounds. No murmur.  Pulmonary:     Effort: Pulmonary effort is normal. No respiratory distress.     Breath sounds: Normal breath sounds. No wheezing.  Musculoskeletal:     Comments: Ambulates with cane  Skin:    General: Skin is warm and dry.     Findings: No erythema or rash.  Neurological:     General: No focal deficit present.     Mental Status: She is alert and oriented to person, place, and time. Mental status is at baseline.  Psychiatric:        Mood and Affect: Mood normal.        Behavior: Behavior normal.        Thought Content: Thought content normal.        Judgment: Judgment normal.      Lab Results:  CBC    Component Value Date/Time   WBC 5.5 09/12/2018 0412   RBC 2.56 (L) 09/12/2018 0412   HGB 8.7 (L) 09/12/2018 0412   HCT 27.2 (L) 09/12/2018 0412   PLT 213 09/12/2018 0412   MCV 106.3 (H) 09/12/2018 0412   MCH 34.0 09/12/2018 0412   MCHC 32.0 09/12/2018 0412   RDW 18.0  (H) 09/12/2018 0412   LYMPHSABS 0.3 (L) 08/29/2018 0747   MONOABS 0.5 08/29/2018 0747   EOSABS 0.0 08/29/2018 0747   BASOSABS 0.0 08/29/2018 0747    BMET    Component Value Date/Time   NA 142 09/12/2018 0412   K 4.2 09/12/2018 0412   CL 113 (H) 09/12/2018 0412   CO2 20 (L) 09/12/2018 0412   GLUCOSE 105 (H) 09/12/2018 0102  BUN 38 (H) 09/12/2018 0412   CREATININE 2.77 (H) 09/12/2018 0412   CREATININE 1.17 (H) 01/10/2013 1234   CALCIUM 8.3 (L) 09/12/2018 0412   GFRNONAA 18 (L) 09/12/2018 0412   GFRAA 21 (L) 09/12/2018 0412    BNP    Component Value Date/Time   BNP 392.9 (H) 08/29/2018 0942    ProBNP No results found for: PROBNP  Imaging: US Renal  Result Date: 08/30/2018 CLINICAL DATA:  Acute renal failure.  Hypertension, kidney stones. EXAM: RENAL / URINARY TRACT ULTRASOUND COMPLETE COMPARISON:  None. FINDINGS: Right Kidney: Renal measurements: 12.3 x 5.8 x 5 cm = volume: 186 mL. Renal cortex is diffusely echogenic. Small cysts within the upper pole, largest measuring 1.6 cm. No suspicious mass or hydronephrosis. Left Kidney: Renal measurements: 10.4 x 5.4 x 5 cm = volume: 148 mL. Renal cortex is diffusely echogenic. Mildly complicated cyst within the lower pole measures 1.8 cm. No suspicious mass or hydronephrosis. Bladder: Appears normal for degree of bladder distention. Ascites noted within the abdomen and pelvis. IMPRESSION: 1. Both kidneys are diffusely echogenic indicating medical renal disease. No suspicious mass or hydronephrosis bilaterally. 2. Ascites. Electronically Signed   By: Franki Cabot M.D.   On: 08/30/2018 20:03   Dg Chest Port 1 View  Result Date: 09/05/2018 CLINICAL DATA:  Acute respiratory failure EXAM: PORTABLE CHEST 1 VIEW COMPARISON:  09/04/2018 FINDINGS: Endotracheal tube and gastric catheter are again seen and stable. Right jugular central line is again noted and stable. Lungs are well aerated bilaterally. Persistent right basilar interstitial  opacities are noted. Slight increase in left basilar atelectasis is seen when compared with the prior exam. IMPRESSION: Stable opacities in the right lung base with slight increase in left basilar atelectasis. Electronically Signed   By: Inez Catalina M.D.   On: 09/05/2018 08:19   Dg Chest Port 1 View  Result Date: 09/04/2018 CLINICAL DATA:  Acute respiratory failure with hypoxia. Follow-up exam. EXAM: PORTABLE CHEST 1 VIEW COMPARISON:  09/03/2018 and older exams. FINDINGS: Continued improvement. Decreased opacity at the left lung base with the left hemidiaphragm not clearly visible. Persistent interstitial type opacities are noted most evident in the right mid lung. No new lung abnormalities. Endotracheal tube, nasogastric tube and dual lumen right internal jugular central venous line are stable. No pneumothorax. IMPRESSION: 1. Continued improvement with improved aeration most evident at the left lung base. No new abnormalities. 2. Support apparatus is stable. Electronically Signed   By: Lajean Manes M.D.   On: 09/04/2018 08:57   Dg Chest Port 1 View  Result Date: 09/03/2018 CLINICAL DATA:  58 year old female with a history respiratory failure EXAM: PORTABLE CHEST 1 VIEW COMPARISON:  09/01/2018, 08/31/2018 CT 06/15/2018 FINDINGS: Cardiomediastinal silhouette unchanged. Endotracheal tube unchanged, terminating 3.5 cm above the carina. Unchanged right IJ sheath. Unchanged gastric tube terminating out of the field of view. Improving aeration at the right lung base. Persisting interlobular septal thickening. No pneumothorax. Blunting of the left costophrenic angle with partial obscuration of the left hemidiaphragm. Changes of centrilobular and paraseptal emphysema. IMPRESSION: Improving aeration, particularly at the right lung base, with persisting mixed interstitial and airspace disease. Likely trace left pleural effusion. Emphysema. Unchanged endotracheal tube, right IJ sheath, and gastric tube.  Electronically Signed   By: Corrie Mckusick D.O.   On: 09/03/2018 09:33   Dg Chest Port 1 View  Result Date: 09/01/2018 CLINICAL DATA:  Check endotracheal tube placement EXAM: PORTABLE CHEST 1 VIEW COMPARISON:  08/31/2018 FINDINGS: Cardiac shadow remains enlarged. Endotracheal  tube and gastric catheter are again seen and stable. Temporary dialysis catheter via the right jugular is again seen and stable. No pneumothorax is noted. Increasing consolidation in the right base is noted. Increasing consolidation but to a lesser degree is noted in the left base as well. No bony abnormality is noted. IMPRESSION: Increasing right basilar consolidation with stable left basilar infiltrates. Electronically Signed   By: Inez Catalina M.D.   On: 09/01/2018 08:27   Dg Chest Port 1 View  Result Date: 08/31/2018 CLINICAL DATA:  Status post dialysis catheter placement today. EXAM: PORTABLE CHEST 1 VIEW COMPARISON:  Single-view of the chest earlier today. FINDINGS: New right IJ approach double lumen central venous catheter is in place with its tip in the mid to lower superior vena cava. ETT and NG tube are unchanged. No pneumothorax. Right worse than left effusions and airspace disease persist. Heart size is enlarged. IMPRESSION: Right IJ approach central venous catheter tip is in the mid to lower superior vena cava. No pneumothorax. No change in right greater than left effusions and airspace disease. Electronically Signed   By: Inge Rise M.D.   On: 08/31/2018 14:32   Dg Chest Port 1 View  Result Date: 08/31/2018 CLINICAL DATA:  ETT present,,resp failure EXAM: PORTABLE CHEST 1 VIEW COMPARISON:  Chest x-rays dated 08/30/2018 and 08/29/2018 FINDINGS: Endotracheal tube is adequately positioned with tip just below the level of the clavicles, approximately 5 cm above the carina. Additional coiled catheter overlying the endotracheal tube, of uncertain etiology. Enteric tube passes below the diaphragm. Heart size and  mediastinal contours appear stable. Persistent RIGHT perihilar opacity, not significantly changed in the interval, superimposed on a background of emphysematous change. No pleural effusion or pneumothorax seen. IMPRESSION: 1. Endotracheal tube is adequately positioned with tip just below the level of the clavicles, approximately 5 cm above the carina. 2. Additional coiled catheter overlying the endotracheal tube, of uncertain etiology. Recommend clinical correlation. 3. Persistent RIGHT perihilar opacity, not significantly changed in the interval, suspected pneumonia superimposed on a background of emphysematous change. Recent chest CT of 07/26/2018 suggested the possibility of RIGHT perihilar mass with postobstructive infiltrate. Electronically Signed   By: Franki Cabot M.D.   On: 08/31/2018 09:07   Dg Chest Port 1 View  Result Date: 08/30/2018 CLINICAL DATA:  Intubated, pneumonia EXAM: PORTABLE CHEST 1 VIEW COMPARISON:  Chest radiograph from one day prior. FINDINGS: Endotracheal tube tip is 3.7 cm above the carina. Enteric tube enters stomach with the tip not seen on this image. Stable cardiomediastinal silhouette with top-normal heart size. No pneumothorax. Probable small left pleural effusion, stable. No significant right pleural effusion. Stable extensive patchy consolidation in the right perihilar and bilateral retrocardiac lungs. IMPRESSION: 1. Well-positioned support structures. 2. Stable small left pleural effusion. 3. Stable extensive patchy right perihilar and bilateral retrocardiac consolidation compatible with a combination of multilobar pneumonia and atelectasis. Electronically Signed   By: Ilona Sorrel M.D.   On: 08/30/2018 09:09   Dg Chest Portable 1 View  Result Date: 08/29/2018 CLINICAL DATA:  Status post repositioning of endotracheal tube. EXAM: PORTABLE CHEST 1 VIEW COMPARISON:  Single-view of the chest approximately 6 minutes prior to this exam. FINDINGS: Endotracheal tube has been  pulled back with its tip now in good position 2.5 cm above the carina. No other change. IMPRESSION: ET tube now in good position.  No other change. Electronically Signed   By: Inge Rise M.D.   On: 08/29/2018 13:37   Dg Chest Portable  1 View  Result Date: 08/29/2018 CLINICAL DATA:  Status post endotracheal tube placement. EXAM: PORTABLE CHEST 1 VIEW COMPARISON:  Single-view of the chest earlier today. FINDINGS: New NG tube is in place with its tip in the stomach. Endotracheal tube tip is approximately 0.9 cm above the carina. Extensive airspace disease in the right chest is unchanged. Cardiomegaly noted. IMPRESSION: NG tube tip is 0.9 cm above the carina. The tube should be withdrawn 1-2 cm. NG tube in good position. No change in airspace disease on the right. Electronically Signed   By: Inge Rise M.D.   On: 08/29/2018 13:36   Dg Chest Port 1 View  Result Date: 08/29/2018 CLINICAL DATA:  Fever and altered mental status. EXAM: PORTABLE CHEST 1 VIEW COMPARISON:  07/27/2018 FINDINGS: The cardiac silhouette is enlarged and globular, with more prominent size than on the prior radiograph dated 07/27/2018. These findings are concerning for a pericardial effusion. There is no evidence of pneumothorax. Right mid lung confluent airspace opacity. Small right pleural effusion. Osseous structures are without acute abnormality. Soft tissues are grossly normal. IMPRESSION: 1. Enlarged and globular cardiac silhouette, with more prominent size than on the prior radiograph dated 07/27/2014. These findings are concerning for a pericardial effusion. 2. Right mid lung confluent airspace opacity likely represents developing pneumonia. 3. Small right pleural effusion. 4. Critical Value/emergent results were called by telephone at the time of interpretation on 08/29/2018 at 8:02 am to Dr. Jola Schmidt , who verbally acknowledged these results. Electronically Signed   By: Fidela Salisbury M.D.   On: 08/29/2018 08:02     Dg Abd Portable 1v  Result Date: 08/31/2018 CLINICAL DATA:  Orogastric tube placement. EXAM: PORTABLE ABDOMEN - 1 VIEW COMPARISON:  None. FINDINGS: Enteric tube is well positioned within the stomach with tip directed towards the stomach pylorus/duodenal bulb. Moderate amount of bowel gas and stool within the visualized portion of the colon. No evidence of free intraperitoneal air. IMPRESSION: Enteric tube well positioned within the stomach with tip directed towards the stomach pylorus/duodenal bulb. Electronically Signed   By: Franki Cabot M.D.   On: 08/31/2018 09:08   Dg Abd Portable 1 View  Result Date: 08/29/2018 CLINICAL DATA:  OG tube placement. EXAM: PORTABLE ABDOMEN - 1 VIEW COMPARISON:  None. FINDINGS: 1252 hours. Diffuse gaseous bowel distention noted. NG tube tip overlies the proximal stomach. Telemetry leads evident. IMPRESSION: NG tube tip overlies the proximal stomach with diffuse gaseous bowel distention. Electronically Signed   By: Misty Stanley M.D.   On: 08/29/2018 13:36   Dg Swallowing Func-speech Pathology  Result Date: 09/10/2018 Objective Swallowing Evaluation: Type of Study: MBS-Modified Barium Swallow Study  Patient Details Name: MARGARUITE TOP MRN: [id number] Date of Birth: [birth year] Today's Date: 09/10/2018 Time: SLP Start Time (ACUTE ONLY): 1359 -SLP Stop Time (ACUTE ONLY): 8341 SLP Time Calculation (min) (ACUTE ONLY): 18 min Past Medical History: Past Medical History: Diagnosis Date  Acute CHF ([place]) 07/2016  Depressive disorder, not elsewhere classified   Family history of adverse reaction to anesthesia   mother passed away during surgery  Hypertension   Hypothyroid   Dr. Wilson Singer  Kidney stone   Lupus (systemic lupus erythematosus) ([place])   Migraine   Pure hypercholesterolemia   Stroke [facility])  Past Surgical History: Past Surgical History: Procedure Laterality Date  CESAREAN SECTION    ECTOPIC PREGNANCY SURGERY    times 2  JOINT REPLACEMENT    LAPAROSCOPY    with rt  salpingectomy  LUMBAR DISC SURGERY  2003  TONSILLECTOMY    TOTAL ABDOMINAL HYSTERECTOMY  2004  ovaries retained, DUB  TUBAL LIGATION  1985 HPI: Pt adm with PNA and resp failure. Intubated 2/23 and extubated 3/2. PMH includes bipolar disorder, CHF, CVA, lupus, CKD, HTN.  Subjective: pt alert and attentive, much less impulsive Assessment / Plan / Recommendation CHL IP CLINICAL IMPRESSIONS 09/10/2018 Clinical Impression Pt shows improvements from initial MBS in terms of airway protection as well as mentation. She is much less impulsive and following commands well. She continues to have intermittent penetration of thin liquids, primarily due to impaired timing, but her laryngeal vestibule closure has improved, so she does not aspirate. Even when penetrates reach the true vocal folds, they are almost entirely ejected back out of the airway upon subsequent swallows. Although a chin tuck does not prevent penetration from occurring, it does keep it from reaching the vocal folds. Recommend upgrade to regular solids and thin liquids, with use of small, single sips and a chin tuck. Pt acknowledges her strategies and that she needs to use precaution as overall she is not yet back to her baseline.  SLP Visit Diagnosis Dysphagia, pharyngeal phase (R13.13) Attention and concentration deficit following -- Frontal lobe and executive function deficit following -- Impact on safety and function Mild aspiration risk   CHL IP TREATMENT RECOMMENDATION 09/10/2018 Treatment Recommendations Therapy as outlined in treatment plan below   Prognosis 09/10/2018 Prognosis for Safe Diet Advancement Good Barriers to Reach Goals -- Barriers/Prognosis Comment -- CHL IP DIET RECOMMENDATION 09/10/2018 SLP Diet Recommendations Regular solids;Thin liquid Liquid Administration via Cup;Straw Medication Administration Whole meds with puree Compensations Slow rate;Small sips/bites;Chin tuck Postural Changes Seated upright at 90 degrees   CHL IP OTHER  RECOMMENDATIONS 09/10/2018 Recommended Consults -- Oral Care Recommendations Oral care BID Other Recommendations --   CHL IP FOLLOW UP RECOMMENDATIONS 09/10/2018 Follow up Recommendations Skilled Nursing facility   [facility] IP FREQUENCY AND DURATION 09/10/2018 Speech Therapy Frequency (ACUTE ONLY) min 2x/week Treatment Duration 2 weeks      CHL IP ORAL PHASE 09/10/2018 Oral Phase WFL Oral - Pudding Teaspoon -- Oral - Pudding Cup -- Oral - Honey Teaspoon -- Oral - Honey Cup -- Oral - Nectar Teaspoon -- Oral - Nectar Cup -- Oral - Nectar Straw -- Oral - Thin Teaspoon -- Oral - Thin Cup -- Oral - Thin Straw -- Oral - Puree -- Oral - Mech Soft -- Oral - Regular -- Oral - Multi-Consistency -- Oral - Pill -- Oral Phase - Comment --  CHL IP PHARYNGEAL PHASE 09/10/2018 Pharyngeal Phase Impaired Pharyngeal- Pudding Teaspoon -- Pharyngeal -- Pharyngeal- Pudding Cup -- Pharyngeal -- Pharyngeal- Honey Teaspoon -- Pharyngeal -- Pharyngeal- Honey Cup -- Pharyngeal -- Pharyngeal- Nectar Teaspoon -- Pharyngeal -- Pharyngeal- Nectar Cup NT Pharyngeal -- Pharyngeal- Nectar Straw NT Pharyngeal -- Pharyngeal- Thin Teaspoon -- Pharyngeal -- Pharyngeal- Thin Cup Reduced airway/laryngeal closure;Penetration/Aspiration during swallow Pharyngeal Material enters airway, remains ABOVE vocal cords then ejected out Pharyngeal- Thin Straw Reduced airway/laryngeal closure;Penetration/Aspiration during swallow Pharyngeal Material enters airway, CONTACTS cords and then ejected out Pharyngeal- Puree NT Pharyngeal -- Pharyngeal- Mechanical Soft -- Pharyngeal -- Pharyngeal- Regular WFL Pharyngeal -- Pharyngeal- Multi-consistency -- Pharyngeal -- Pharyngeal- Pill -- Pharyngeal -- Pharyngeal Comment --  CHL IP CERVICAL ESOPHAGEAL PHASE 09/10/2018 Cervical Esophageal Phase WFL Pudding Teaspoon -- Pudding Cup -- Honey Teaspoon -- Honey Cup -- Nectar Teaspoon -- Nectar Cup -- Nectar Straw -- Thin Teaspoon -- Thin Cup -- Thin Straw -- Puree -- Mechanical Soft --  Regular --  Multi-consistency -- Pill -- Cervical Esophageal Comment -- [name] Nix 09/10/2018, 2:51 PM  Pollyann Glen, M.A. CCC-SLP Acute Rehabilitation Services Pager [phone] Office [phone]             Dg Swallowing Func-speech Pathology  Result Date: 09/07/2018 Note populated for Ellis Savage, Student SLP Objective Swallowing Evaluation: Type of Study: MBS-Modified Barium Swallow Study  Patient Details Name: SARETTA DAHLEM MRN: [id number] Date of Birth: 1961-05-26 Today's Date: 09/07/2018 Time: SLP Start Time (ACUTE ONLY): 1108 -SLP Stop Time (ACUTE ONLY): 1140 SLP Time Calculation (min) (ACUTE ONLY): 32 min Past Medical History: Past Medical History: Diagnosis Date  Acute CHF ([place]) 07/2016  Depressive disorder, not elsewhere classified   Family history of adverse reaction to anesthesia   mother passed away during surgery  Hypertension   Hypothyroid   Dr. Wilson Singer  Kidney stone   Lupus (systemic lupus erythematosus) ([place])   Migraine   Pure hypercholesterolemia   Stroke [facility])  Past Surgical History: Past Surgical History: Procedure Laterality Date  CESAREAN SECTION    ECTOPIC PREGNANCY SURGERY    times 2  JOINT REPLACEMENT    LAPAROSCOPY    with rt salpingectomy  LUMBAR DISC SURGERY  2003  TONSILLECTOMY    TOTAL ABDOMINAL HYSTERECTOMY  2004  ovaries retained, DUB  TUBAL LIGATION  1985 HPI: Pt adm with PNA and resp failure. Intubated 2/23 and extubated 3/2. PMH includes bipolar disorder, CHF, CVA, lupus, CKD, HTN.  Subjective: pt alert, asking repetitive questions Assessment / Plan / Recommendation CHL IP CLINICAL IMPRESSIONS 09/07/2018 Clinical Impression Pt presents with moderate pharyngeal dysphagia following prolonged intubation and exacerbated by cognitive status. Swallow dysfunction marked by suspected incomplete glottal closure, decreased laryngeal sensation, inconsistent timing of swallow initiation and impulsive consumption of liquids resulting in silent aspiration of thin liquids. Max verbal cues to  elicit clearance of aspirate with cough resulted in partial clearance. Chin tuck did not consistently prevent aspiration. Nectar thick liquids prevented aspiration and puree/regular solid POs consumed with no aspiration or excessive residue. Pt belched intermittently throughout testing and coughed x3 after. Given prolonged intubation, subsequent decrease in laryngeal sensation and cognitive status, recommend dys 3, nectar liquid diet with full supervision to take small bites/sips and consume at a slow rate. Pt may have ice chips intermittently throughout day in betwen meals after thorough oral care. SLP to f/u to for diet tolerance and provide strategies and exercises for safe swallowing.  SLP Visit Diagnosis Dysphagia, pharyngeal phase (R13.13) Attention and concentration deficit following -- Frontal lobe and executive function deficit following -- Impact on safety and function Moderate aspiration risk   [facility] IP TREATMENT RECOMMENDATION 09/07/2018 Treatment Recommendations Therapy as outlined in treatment plan below   Prognosis 09/07/2018 Prognosis for Safe Diet Advancement Good Barriers to Reach Goals -- Barriers/Prognosis Comment -- [facility] IP DIET RECOMMENDATION 09/07/2018 SLP Diet Recommendations Dysphagia 3 (Mech soft) solids;Nectar thick liquid Liquid Administration via Cup Medication Administration Whole meds with puree Compensations Slow rate;Small sips/bites Postural Changes Remain semi-upright after after feeds/meals (Comment);Seated upright at 90 degrees   [facility] IP OTHER RECOMMENDATIONS 09/07/2018 Recommended Consults -- Oral Care Recommendations -- Other Recommendations Have oral suction available   [facility] IP FOLLOW UP RECOMMENDATIONS 09/07/2018 Follow up Recommendations Skilled Nursing facility   [facility] IP FREQUENCY AND DURATION 09/07/2018 Speech Therapy Frequency (ACUTE ONLY) min 2x/week Treatment Duration 2 weeks      [facility] IP ORAL PHASE 09/07/2018 Oral Phase WFL Oral - Pudding Teaspoon -- Oral - Pudding Cup --  Oral - Honey  Teaspoon -- Oral - Honey Cup -- Oral - Nectar Teaspoon -- Oral - Nectar Cup -- Oral - Nectar Straw -- Oral - Thin Teaspoon -- Oral - Thin Cup -- Oral - Thin Straw -- Oral - Puree -- Oral - Mech Soft -- Oral - Regular -- Oral - Multi-Consistency -- Oral - Pill -- Oral Phase - Comment --  [facility] IP PHARYNGEAL PHASE 09/07/2018 Pharyngeal Phase Impaired Pharyngeal- Pudding Teaspoon -- Pharyngeal -- Pharyngeal- Pudding Cup -- Pharyngeal -- Pharyngeal- Honey Teaspoon -- Pharyngeal -- Pharyngeal- Honey Cup -- Pharyngeal -- Pharyngeal- Nectar Teaspoon -- Pharyngeal -- Pharyngeal- Nectar Cup Delayed swallow initiation-vallecula;Delayed swallow initiation-pyriform sinuses;Penetration/Aspiration during swallow;Compensatory strategies attempted (with notebox) Pharyngeal Material enters airway, remains ABOVE vocal cords then ejected out Pharyngeal- Nectar Straw Delayed swallow initiation-vallecula;Delayed swallow initiation-pyriform sinuses;Penetration/Aspiration during swallow;Compensatory strategies attempted (with notebox) Pharyngeal Material enters airway, remains ABOVE vocal cords then ejected out Pharyngeal- Thin Teaspoon -- Pharyngeal -- Pharyngeal- Thin Cup Delayed swallow initiation-vallecula;Delayed swallow initiation-pyriform sinuses;Penetration/Aspiration during swallow;Compensatory strategies attempted (with notebox) Pharyngeal Material enters airway, passes BELOW cords without attempt by patient to eject out (silent aspiration) Pharyngeal- Thin Straw Delayed swallow initiation-vallecula;Delayed swallow initiation-pyriform sinuses;Penetration/Aspiration during swallow;Compensatory strategies attempted (with notebox) Pharyngeal Material enters airway, passes BELOW cords without attempt by patient to eject out (silent aspiration);Material enters airway, CONTACTS cords and not ejected out Pharyngeal- Puree WFL Pharyngeal -- Pharyngeal- Mechanical Soft -- Pharyngeal -- Pharyngeal- Regular WFL Pharyngeal -- Pharyngeal-  Multi-consistency -- Pharyngeal -- Pharyngeal- Pill -- Pharyngeal -- Pharyngeal Comment --  [facility] IP CERVICAL ESOPHAGEAL PHASE 09/07/2018 Cervical Esophageal Phase (No Data) Pudding Teaspoon -- Pudding Cup -- Honey Teaspoon -- Honey Cup -- Nectar Teaspoon -- Nectar Cup -- Nectar Straw -- Thin Teaspoon -- Thin Cup -- Thin Straw -- Puree -- Mechanical Soft -- Regular -- Multi-consistency -- Pill -- Cervical Esophageal Comment -- [name] Nix 09/07/2018, 1:33 PM  Pollyann Glen, M.A. CCC-SLP Acute Rehabilitation Services Pager [phone] Office [phone]               Assessment & Plan:   Abnormal chest CT - CT chest January 2020 showed possible right hilar mass with right hilar adenopathy - Needs to reschedule PET scan and follow-up with Dr. [name] after to review results  Right lower lobe pneumonia ([place]) - Doing well, s/p intubation for RLL pneumonia  - Completed Levaquin course  - Needs repeat chest x-ray in 2 weeks follow-up pneumonia  Smoker -Quit smoking in February 2020, congratulated and strongly encouraged continued cessation   Acute respiratory failure with hypoxia ([place]) - Presumed COPD, needs repeat PFTs - Trial Spiriva 2 puffs once daily (sample given)  SLE (systemic lupus erythematosus) ([facility]) -Continue prednisone 5 mg daily - Needs PFTs   Martyn Ehrich, NP 09/20/2018

## 2018-09-20 ENCOUNTER — Encounter: Payer: Self-pay | Admitting: Primary Care

## 2018-09-20 DIAGNOSIS — J189 Pneumonia, unspecified organism: Secondary | ICD-10-CM | POA: Insufficient documentation

## 2018-09-20 DIAGNOSIS — J181 Lobar pneumonia, unspecified organism: Secondary | ICD-10-CM

## 2018-09-20 DIAGNOSIS — R9389 Abnormal findings on diagnostic imaging of other specified body structures: Secondary | ICD-10-CM | POA: Insufficient documentation

## 2018-09-20 NOTE — Assessment & Plan Note (Addendum)
-   Presumed COPD, needs repeat PFTs - Trial Spiriva 2 puffs once daily (sample given)

## 2018-09-20 NOTE — Assessment & Plan Note (Signed)
-  Continue prednisone 5 mg daily - Needs PFTs

## 2018-09-20 NOTE — Assessment & Plan Note (Signed)
-   CT chest January 2020 showed possible right hilar mass with right hilar adenopathy - Needs to reschedule PET scan and follow-up with Dr. [name] after to review results

## 2018-09-20 NOTE — Assessment & Plan Note (Signed)
-  Quit smoking in February 2020, congratulated and strongly encouraged continued cessation

## 2018-09-20 NOTE — Assessment & Plan Note (Signed)
-   Doing well, s/p intubation for RLL pneumonia  - Completed Levaquin course  - Needs repeat chest x-ray in 2 weeks follow-up pneumonia

## 2018-10-05 ENCOUNTER — Other Ambulatory Visit: Payer: Self-pay

## 2018-10-05 ENCOUNTER — Encounter (HOSPITAL_COMMUNITY)
Admission: RE | Admit: 2018-10-05 | Discharge: 2018-10-05 | Disposition: A | Discharge status: Home / Self Care | Payer: BLUE CROSS/BLUE SHIELD | Source: Ambulatory Visit | Attending: Pulmonary Disease | Admitting: Pulmonary Disease

## 2018-10-05 DIAGNOSIS — R911 Solitary pulmonary nodule: Secondary | ICD-10-CM | POA: Insufficient documentation

## 2018-10-05 LAB — GLUCOSE, CAPILLARY: GLUCOSE-CAPILLARY: 108 mg/dL — AB (ref 70–99)

## 2018-10-05 MED ORDER — FLUDEOXYGLUCOSE F - 18 (FDG) INJECTION
8.3000 | Freq: Once | INTRAVENOUS | Status: AC | PRN
  Administered 2018-10-05: 8.3 via INTRAVENOUS

## 2018-10-19 ENCOUNTER — Ambulatory Visit: Payer: BLUE CROSS/BLUE SHIELD | Admitting: Pulmonary Disease

## 2018-10-19 ENCOUNTER — Other Ambulatory Visit: Payer: Self-pay

## 2018-10-19 ENCOUNTER — Encounter: Payer: Self-pay | Admitting: Physician Assistant

## 2018-10-19 ENCOUNTER — Ambulatory Visit (INDEPENDENT_AMBULATORY_CARE_PROVIDER_SITE_OTHER): Payer: BLUE CROSS/BLUE SHIELD | Admitting: Physician Assistant

## 2018-10-19 DIAGNOSIS — G4700 Insomnia, unspecified: Secondary | ICD-10-CM

## 2018-10-19 DIAGNOSIS — F411 Generalized anxiety disorder: Secondary | ICD-10-CM | POA: Diagnosis not present

## 2018-10-19 DIAGNOSIS — F319 Bipolar disorder, unspecified: Secondary | ICD-10-CM | POA: Diagnosis not present

## 2018-10-19 MED ORDER — ESCITALOPRAM OXALATE 20 MG PO TABS: 30.0000 mg | ORAL_TABLET | Freq: Every day | ORAL | 0 refills | Status: DC

## 2018-10-19 MED ORDER — HYDROXYZINE HCL 25 MG PO TABS: ORAL_TABLET | ORAL | 1 refills | Status: DC

## 2018-10-19 MED ORDER — QUETIAPINE FUMARATE 300 MG PO TABS: 600.0000 mg | ORAL_TABLET | Freq: Every day | ORAL | 0 refills | Status: DC

## 2018-10-19 NOTE — Progress Notes (Signed)
[facility] Check  Patient ID: Anna [name],  MRN: [id number]  PCP: [name], MD  Date of Evaluation: 10/19/18 Time spent:25 minutes  Chief Complaint:  Chief Complaint    Follow-up     Virtual Visit via Telephone Note  I connected with Dillon Bjork on 10/20/18 at  3:30 PM EDT by telephone and verified that I am speaking with the correct person using two identifiers.   I discussed the limitations, risks, security and privacy concerns of performing an evaluation and management service by telephone and the availability of in person appointments. I also discussed with the patient that there may be a patient responsible charge related to this service. The patient expressed understanding and agreed to proceed.    HISTORY/CURRENT STATUS: HPI For routine med check.  Is more anxious right now. Was in hospital since the last visit.  See records on chart.  She had pneumonia and sepsis and states she coded 3 times.  Says she is still very weak but otherwise okay.  States they took her off of the Valium because it was affecting her kidneys.  She has been much more anxious since going off of it.  She has also quit smoking since then.  She is chewing a lot of bubble gum to help with the oral fixation.  As far as her mental health goes, she is doing well except for the anxiety.  She is still physically weak and having OT and PT coming out to her home, but she does enjoy things, she is not wanting to isolate although she has to right now because of the coronavirus pandemic.  She does not cry easily.  She denies any suicidal or homicidal thoughts.  Patient denies increased energy with decreased need for sleep, no increased talkativeness, no racing thoughts, no impulsivity or risky behaviors, no increased spending, no increased libido, no grandiosity.  Denies muscle or joint pain, stiffness, or dystonia.  Denies dizziness, syncope, seizures, numbness, tingling, tremor, tics,  unsteady gait, slurred speech, confusion.   Individual Medical History/ Review of Systems: Changes? :Yes Was in hospital for pneumonia and sepsis and was on ventilator the end of Feb.   Past medications for mental health diagnoses include: Valium and others but unknown names.  Allergies: Patient has no known allergies.  Current Medications:  Current Outpatient Medications:  .  aspirin EC 325 MG tablet, Take 325 mg by mouth daily., Disp: , Rfl:  .  atorvastatin (LIPITOR) 20 MG tablet, Take 20 mg by mouth daily., Disp: , Rfl:  .  calcitRIOL (ROCALTROL) 0.25 MCG capsule, Take 0.25 mcg by mouth daily., Disp: , Rfl: 6 .  escitalopram (LEXAPRO) 20 MG tablet, Take 1.5 tablets (30 mg total) by mouth daily., Disp: 135 tablet, Rfl: 0 .  ferrous sulfate 325 (65 FE) MG tablet, Take 1 tablet (325 mg total) by mouth daily., Disp: 30 tablet, Rfl: 3 .  folic acid (FOLVITE) 1 MG tablet, Take 1 mg by mouth daily., Disp: , Rfl:  .  hydroxychloroquine (PLAQUENIL) 200 MG tablet, Take 200 mg by mouth daily. , Disp: , Rfl: 0 .  labetalol (NORMODYNE) 200 MG tablet, Take 1 tablet (200 mg total) by mouth 2 (two) times daily., Disp: 60 tablet, Rfl: 0 .  levothyroxine (SYNTHROID, LEVOTHROID) 50 MCG tablet, Take 50 mcg by mouth daily before breakfast., Disp: , Rfl:  .  NIFEdipine (ADALAT CC) 30 MG 24 hr tablet, Take 1 tablet (30 mg total) by mouth daily., Disp: 30 tablet, Rfl:  0 .  omeprazole (PRILOSEC) 20 MG capsule, Take 20 mg by mouth daily., Disp: , Rfl:  .  ondansetron (ZOFRAN) 4 MG tablet, Take 1 tablet (4 mg total) by mouth every 6 (six) hours as needed for nausea., Disp: 20 tablet, Rfl: 0 .  polyethylene glycol (MIRALAX / GLYCOLAX) packet, Take 17 g by mouth 2 (two) times daily. (Patient taking differently: Take 17 g by mouth daily as needed (constipation). ), Disp: , Rfl: 0 .  predniSONE (DELTASONE) 5 MG tablet, Take 1 tablet (5 mg total) by mouth daily with breakfast., Disp: 20 tablet, Rfl: 0 .  sennosides  (SENOKOT) 8.8 MG/5ML syrup, Take 5 mLs by mouth 2 (two) times daily., Disp: 240 mL, Rfl: 0 .  sodium bicarbonate 650 MG tablet, Take 1,300 mg by mouth 2 (two) times daily., Disp: , Rfl:  .  Tiotropium Bromide Monohydrate (SPIRIVA RESPIMAT) 1.25 MCG/ACT AERS, Inhale 2 puffs into the lungs daily., Disp: 4 g, Rfl: 2 .  vitamin B-12 (CYANOCOBALAMIN) 1000 MCG tablet, Take 1,000 mcg by mouth daily., Disp: , Rfl:  .  hydrOXYzine (ATARAX/VISTARIL) 25 MG tablet, 1 po q8h prn anxiety, and may take 2 qhs prn, Disp: 60 tablet, Rfl: 1 .  QUEtiapine (SEROQUEL) 300 MG tablet, Take 2 tablets (600 mg total) by mouth at bedtime., Disp: 180 tablet, Rfl: 0 Medication Side Effects: none  Family Medical/ Social History: Changes? Yes coronavirus pandemic isolation  MENTAL HEALTH EXAM:  Last menstrual period 07/07/2002.There is no height or weight on file to calculate BMI.  General Appearance: Phone visit unable to assess  Eye Contact:  Unable to assess  Speech:  Clear and Coherent  Volume:  Normal  Mood:  Euthymic  Affect:  Unable to assess  Thought Process:  Goal Directed  Orientation:  Full (Time, Place, and Person)  Thought Content: Logical   Suicidal Thoughts:  No  Homicidal Thoughts:  No  Memory:  WNL  Judgement:  Good  Insight:  Good  Psychomotor Activity:  Unable to assess  Concentration:  Concentration: Good  Recall:  Good  Fund of Knowledge: Good  Language: Good  Assets:  Desire for Improvement  ADL's:  Intact  Cognition: WNL  Prognosis:  Good    DIAGNOSES:    ICD-10-CM   1. Bipolar I disorder ([place]) F31.9   2. Generalized anxiety disorder F41.1   3. Insomnia, unspecified type G47.00   Recent hospitalization for pneumonia and sepsis.  Receiving Psychotherapy: No    RECOMMENDATIONS: I spent 25 minutes with her and 50% of that time was spent in counseling.  We discussed her diagnosis and different treatment options for the anxiety as well as mood disorder.  Since she is now off the  Valium I prefer that we keep her off as long as we can effectively treat the anxiety as well as insomnia.  We discussed increasing the Seroquel to help with her mood as well as sleep.  She is in agreement. Increase Seroquel to 600 mg p.o. nightly. Start hydroxyzine 25 mg 1 3 times daily as needed and she may take 2 nightly as needed sleep.  Sedation precautions were discussed.  This will help anxiety and sleep. Continue Lexapro total of 30 mg p.o. daily. Continue vitamins and supplements that she is already on. Recommend she get back into counseling. Return in 4 weeks or sooner as needed.   Donnal Moat, PA-C   This record has been created using [employer].  Chart creation errors have been sought, but may  not always have been located and corrected. Such creation errors do not reflect on the standard of medical care.

## 2018-11-11 ENCOUNTER — Other Ambulatory Visit: Payer: Self-pay | Admitting: Physician Assistant

## 2018-11-30 ENCOUNTER — Other Ambulatory Visit: Payer: Self-pay | Admitting: Physician Assistant

## 2018-11-30 NOTE — Telephone Encounter (Signed)
Pt needs rx for Hydroxyzine she only got 7 pills last week.needs rx for  Hydroxychloroquine also sent to [facility] on [address].

## 2018-12-01 NOTE — Discharge Instructions (Signed)
Epoetin Alfa injection °What is this medicine? °EPOETIN ALFA (e POE e tin AL fa) helps your body make more red blood cells. This medicine is used to treat anemia caused by chronic kidney disease, cancer chemotherapy, or HIV-therapy. It may also be used before surgery if you have anemia. °This medicine may be used for other purposes; ask your health care provider or pharmacist if you have questions. °COMMON BRAND NAME(S): Epogen, Procrit, Retacrit °What should I tell my health care provider before I take this medicine? °They need to know if you have any of these conditions: °-cancer °-heart disease °-high blood pressure °-history of blood clots °-history of stroke °-low levels of folate, iron, or vitamin B12 in the blood °-seizures °-an unusual or allergic reaction to erythropoietin, albumin, benzyl alcohol, hamster proteins, other medicines, foods, dyes, or preservatives °-pregnant or trying to get pregnant °-breast-feeding °How should I use this medicine? °This medicine is for injection into a vein or under the skin. It is usually given by a health care professional in a hospital or clinic setting. °If you get this medicine at home, you will be taught how to prepare and give this medicine. Use exactly as directed. Take your medicine at regular intervals. Do not take your medicine more often than directed. °It is important that you put your used needles and syringes in a special sharps container. Do not put them in a trash can. If you do not have a sharps container, call your pharmacist or healthcare provider to get one. °A special MedGuide will be given to you by the pharmacist with each prescription and refill. Be sure to read this information carefully each time. °Talk to your pediatrician regarding the use of this medicine in children. While this drug may be prescribed for selected conditions, precautions do apply. °Overdosage: If you think you have taken too much of this medicine contact a poison control center  or emergency room at once. °NOTE: This medicine is only for you. Do not share this medicine with others. °What if I miss a dose? °If you miss a dose, take it as soon as you can. If it is almost time for your next dose, take only that dose. Do not take double or extra doses. °What may interact with this medicine? °Interactions have not been studied. °This list may not describe all possible interactions. Give your health care provider a list of all the medicines, herbs, non-prescription drugs, or dietary supplements you use. Also tell them if you smoke, drink alcohol, or use illegal drugs. Some items may interact with your medicine. °What should I watch for while using this medicine? °Your condition will be monitored carefully while you are receiving this medicine. °You may need blood work done while you are taking this medicine. °This medicine may cause a decrease in vitamin B6. You should make sure that you get enough vitamin B6 while you are taking this medicine. Discuss the foods you eat and the vitamins you take with your health care professional. °What side effects may I notice from receiving this medicine? °Side effects that you should report to your doctor or health care professional as soon as possible: °-allergic reactions like skin rash, itching or hives, swelling of the face, lips, or tongue °-seizures °-signs and symptoms of a blood clot such as breathing problems; changes in vision; chest pain; severe, sudden headache; pain, swelling, warmth in the leg; trouble speaking; sudden numbness or weakness of the face, arm or leg °-signs and symptoms of a stroke   like changes in vision; confusion; trouble speaking or understanding; severe headaches; sudden numbness or weakness of the face, arm or leg; trouble walking; dizziness; loss of balance or coordination °Side effects that usually do not require medical attention (report to your doctor or health care professional if they continue or are  bothersome): °-chills °-cough °-dizziness °-fever °-headaches °-joint pain °-muscle cramps °-muscle pain °-nausea, vomiting °-pain, redness, or irritation at site where injected °This list may not describe all possible side effects. Call your doctor for medical advice about side effects. You may report side effects to FDA at 1-800-FDA-1088. °Where should I keep my medicine? °Keep out of the reach of children. °Store in a refrigerator between 2 and 8 degrees C (36 and 46 degrees F). Do not freeze or shake. Throw away any unused portion if using a single-dose vial. Multi-dose vials can be kept in the refrigerator for up to 21 days after the initial dose. Throw away unused medicine. °NOTE: This sheet is a summary. It may not cover all possible information. If you have questions about this medicine, talk to your doctor, pharmacist, or health care provider. °© 2019 Elsevier/Gold Standard (2017-01-30 08:35:19) ° °

## 2018-12-02 ENCOUNTER — Ambulatory Visit (HOSPITAL_COMMUNITY)
Admission: RE | Admit: 2018-12-02 | Discharge: 2018-12-02 | Disposition: A | Discharge status: Home / Self Care | Payer: BLUE CROSS/BLUE SHIELD | Source: Ambulatory Visit | Attending: Nephrology | Admitting: Nephrology

## 2018-12-02 ENCOUNTER — Other Ambulatory Visit: Payer: Self-pay

## 2018-12-02 ENCOUNTER — Encounter: Payer: Self-pay | Admitting: *Deleted

## 2018-12-02 VITALS — BP 129/103 | HR 89 | Temp 97.3°F | Resp 18

## 2018-12-02 DIAGNOSIS — N183 Chronic kidney disease, stage 3 unspecified: Secondary | ICD-10-CM

## 2018-12-02 LAB — POCT HEMOGLOBIN-HEMACUE: Hemoglobin: 10 g/dL — ABNORMAL LOW (ref 12.0–15.0)

## 2018-12-02 MED ORDER — EPOETIN ALFA-EPBX 10000 UNIT/ML IJ SOLN
30000.0000 [IU] | INTRAMUSCULAR | Status: DC
  Administered 2018-12-02: 30000 [IU] via SUBCUTANEOUS
  Filled 2018-12-02: qty 3

## 2018-12-20 ENCOUNTER — Other Ambulatory Visit (HOSPITAL_COMMUNITY)
Admission: RE | Admit: 2018-12-20 | Discharge: 2018-12-20 | Disposition: A | Discharge status: Home / Self Care | Payer: BC Managed Care – PPO | Source: Ambulatory Visit | Attending: Nephrology | Admitting: Nephrology

## 2018-12-23 ENCOUNTER — Encounter (HOSPITAL_COMMUNITY)
Admission: RE | Admit: 2018-12-23 | Discharge: 2018-12-23 | Disposition: A | Discharge status: Home / Self Care | Payer: BC Managed Care – PPO | Source: Ambulatory Visit | Attending: Nephrology | Admitting: Nephrology

## 2018-12-23 ENCOUNTER — Other Ambulatory Visit: Payer: Self-pay

## 2018-12-23 VITALS — BP 157/108 | HR 101 | Resp 20

## 2018-12-23 DIAGNOSIS — N183 Chronic kidney disease, stage 3 unspecified: Secondary | ICD-10-CM

## 2018-12-23 LAB — IRON AND TIBC
Iron: 74 ug/dL (ref 28–170)
Saturation Ratios: 28 % (ref 10.4–31.8)
TIBC: 267 ug/dL (ref 250–450)
UIBC: 193 ug/dL

## 2018-12-23 LAB — POCT HEMOGLOBIN-HEMACUE: Hemoglobin: 11.9 g/dL — ABNORMAL LOW (ref 12.0–15.0)

## 2018-12-23 LAB — FERRITIN: Ferritin: 113 ng/mL (ref 11–307)

## 2018-12-23 MED ORDER — EPOETIN ALFA-EPBX 10000 UNIT/ML IJ SOLN
30000.0000 [IU] | INTRAMUSCULAR | Status: DC
  Administered 2018-12-23: 12:00:00 30000 [IU] via SUBCUTANEOUS
  Filled 2018-12-23: qty 3

## 2018-12-27 ENCOUNTER — Other Ambulatory Visit (HOSPITAL_COMMUNITY): Payer: Self-pay | Admitting: Nephrology

## 2018-12-27 DIAGNOSIS — N184 Chronic kidney disease, stage 4 (severe): Secondary | ICD-10-CM

## 2019-01-05 ENCOUNTER — Other Ambulatory Visit: Payer: Self-pay | Admitting: Radiology

## 2019-01-05 ENCOUNTER — Other Ambulatory Visit: Payer: Self-pay | Admitting: Student

## 2019-01-06 ENCOUNTER — Other Ambulatory Visit: Payer: Self-pay

## 2019-01-06 ENCOUNTER — Encounter (HOSPITAL_COMMUNITY): Payer: Self-pay

## 2019-01-06 ENCOUNTER — Ambulatory Visit (HOSPITAL_COMMUNITY)
Admission: RE | Admit: 2019-01-06 | Discharge: 2019-01-06 | Disposition: A | Discharge status: Home / Self Care | Payer: BC Managed Care – PPO | Source: Ambulatory Visit | Attending: Nephrology | Admitting: Nephrology

## 2019-01-06 ENCOUNTER — Encounter: Payer: Self-pay | Admitting: Primary Care

## 2019-01-06 DIAGNOSIS — Z79890 Hormone replacement therapy (postmenopausal): Secondary | ICD-10-CM | POA: Insufficient documentation

## 2019-01-06 DIAGNOSIS — Z833 Family history of diabetes mellitus: Secondary | ICD-10-CM | POA: Insufficient documentation

## 2019-01-06 DIAGNOSIS — E039 Hypothyroidism, unspecified: Secondary | ICD-10-CM | POA: Insufficient documentation

## 2019-01-06 DIAGNOSIS — Z7952 Long term (current) use of systemic steroids: Secondary | ICD-10-CM | POA: Diagnosis not present

## 2019-01-06 DIAGNOSIS — N184 Chronic kidney disease, stage 4 (severe): Secondary | ICD-10-CM | POA: Insufficient documentation

## 2019-01-06 DIAGNOSIS — Z7982 Long term (current) use of aspirin: Secondary | ICD-10-CM | POA: Insufficient documentation

## 2019-01-06 DIAGNOSIS — I130 Hypertensive heart and chronic kidney disease with heart failure and stage 1 through stage 4 chronic kidney disease, or unspecified chronic kidney disease: Secondary | ICD-10-CM | POA: Insufficient documentation

## 2019-01-06 DIAGNOSIS — I509 Heart failure, unspecified: Secondary | ICD-10-CM | POA: Insufficient documentation

## 2019-01-06 DIAGNOSIS — F329 Major depressive disorder, single episode, unspecified: Secondary | ICD-10-CM | POA: Insufficient documentation

## 2019-01-06 DIAGNOSIS — Z8249 Family history of ischemic heart disease and other diseases of the circulatory system: Secondary | ICD-10-CM | POA: Diagnosis not present

## 2019-01-06 DIAGNOSIS — F17210 Nicotine dependence, cigarettes, uncomplicated: Secondary | ICD-10-CM | POA: Insufficient documentation

## 2019-01-06 DIAGNOSIS — Z8673 Personal history of transient ischemic attack (TIA), and cerebral infarction without residual deficits: Secondary | ICD-10-CM | POA: Insufficient documentation

## 2019-01-06 DIAGNOSIS — Z79899 Other long term (current) drug therapy: Secondary | ICD-10-CM | POA: Diagnosis not present

## 2019-01-06 DIAGNOSIS — E7800 Pure hypercholesterolemia, unspecified: Secondary | ICD-10-CM | POA: Insufficient documentation

## 2019-01-06 LAB — CBC
HCT: 36.6 % (ref 36.0–46.0)
Hemoglobin: 12.3 g/dL (ref 12.0–15.0)
MCH: 35 pg — ABNORMAL HIGH (ref 26.0–34.0)
MCHC: 33.6 g/dL (ref 30.0–36.0)
MCV: 104.3 fL — ABNORMAL HIGH (ref 80.0–100.0)
Platelets: 231 10*3/uL (ref 150–400)
RBC: 3.51 MIL/uL — ABNORMAL LOW (ref 3.87–5.11)
RDW: 15.3 % (ref 11.5–15.5)
WBC: 7.4 10*3/uL (ref 4.0–10.5)
nRBC: 0 % (ref 0.0–0.2)

## 2019-01-06 LAB — PROTIME-INR
INR: 1.2 (ref 0.8–1.2)
Prothrombin Time: 15 seconds (ref 11.4–15.2)

## 2019-01-06 MED ORDER — LIDOCAINE HCL (PF) 1 % IJ SOLN
INTRAMUSCULAR | Status: AC
  Filled 2019-01-06: qty 30

## 2019-01-06 MED ORDER — HYDRALAZINE HCL 20 MG/ML IJ SOLN
INTRAMUSCULAR | Status: AC
  Filled 2019-01-06: qty 1

## 2019-01-06 MED ORDER — GELATIN ABSORBABLE 12-7 MM EX MISC
CUTANEOUS | Status: AC
  Filled 2019-01-06: qty 1

## 2019-01-06 MED ORDER — HYDRALAZINE HCL 20 MG/ML IJ SOLN
INTRAMUSCULAR | Status: AC | PRN
  Administered 2019-01-06: 20 mg via INTRAVENOUS

## 2019-01-06 MED ORDER — MIDAZOLAM HCL 2 MG/2ML IJ SOLN
INTRAMUSCULAR | Status: AC
  Filled 2019-01-06: qty 2

## 2019-01-06 MED ORDER — FENTANYL CITRATE (PF) 100 MCG/2ML IJ SOLN
INTRAMUSCULAR | Status: AC
  Filled 2019-01-06: qty 2

## 2019-01-06 MED ORDER — SODIUM CHLORIDE 0.9 % IV SOLN: INTRAVENOUS | Status: DC

## 2019-01-06 MED ORDER — FENTANYL CITRATE (PF) 100 MCG/2ML IJ SOLN
INTRAMUSCULAR | Status: AC | PRN
  Administered 2019-01-06 (×2): 50 ug via INTRAVENOUS

## 2019-01-06 MED ORDER — MIDAZOLAM HCL 2 MG/2ML IJ SOLN
INTRAMUSCULAR | Status: AC | PRN
  Administered 2019-01-06 (×2): 1 mg via INTRAVENOUS

## 2019-01-06 NOTE — Procedures (Signed)
Interventional Radiology Procedure Note  Procedure: US guided biopsy of the right kidney, medical renal.   Complications: None Recommendations:  - Ok to shower tomorrow - 2 hours recovery - DC home 2hours when goals met - Do not submerge for 7 days - Routine wound care   Signed,  Dulcy Fanny. Earleen Newport, DO

## 2019-01-06 NOTE — Discharge Instructions (Signed)
Percutaneous Kidney Biopsy, Care After °This sheet gives you information about how to care for yourself after your procedure. Your health care provider may also give you more specific instructions. If you have problems or questions, contact your health care provider. °What can I expect after the procedure? °After the procedure, it is common to have: °· Pain or soreness near the area where the needle went through your skin (biopsy site). °· Bright pink or cloudy urine for 24 hours after the procedure. °Follow these instructions at home: °Activity °· Return to your normal activities as told by your health care provider. Ask your health care provider what activities are safe for you. °· Do not drive for 24 hours if you were given a medicine to help you relax (sedative). °· Do not lift anything that is heavier than 10 lb (4.5 kg) until your health care provider tells you that it is safe. °· Avoid activities that take a lot of effort (are strenuous) until your health care provider approves. Most people will have to wait 2 weeks before returning to activities such as exercise or sexual intercourse. °General instructions ° °· Take over-the-counter and prescription medicines only as told by your health care provider. °· You may eat and drink after your procedure. Follow instructions from your health care provider about eating or drinking restrictions. °· Check your biopsy site every day for signs of infection. Check for: °? More redness, swelling, or pain. °? More fluid or blood. °? Warmth. °? Pus or a bad smell. °· Keep all follow-up visits as told by your health care provider. This is important. °Contact a health care provider if: °· You have more redness, swelling, or pain around your biopsy site. °· You have more fluid or blood coming from your biopsy site. °· Your biopsy site feels warm to the touch. °· You have pus or a bad smell coming from your biopsy site. °· You have blood in your urine more than 24 hours after  your procedure. °Get help right away if: °· You have dark red or brown urine. °· You have a fever. °· You are unable to urinate. °· You feel burning when you urinate. °· You feel faint. °· You have severe pain in your abdomen or side. °This information is not intended to replace advice given to you by your health care provider. Make sure you discuss any questions you have with your health care provider. °Document Released: 02/23/2013 Document Revised: 06/05/2017 Document Reviewed: 04/04/2016 °Elsevier Patient Education © 2020 Elsevier Inc. °Moderate Conscious Sedation, Adult, Care After °These instructions provide you with information about caring for yourself after your procedure. Your health care provider may also give you more specific instructions. Your treatment has been planned according to current medical practices, but problems sometimes occur. Call your health care provider if you have any problems or questions after your procedure. °What can I expect after the procedure? °After your procedure, it is common: °· To feel sleepy for several hours. °· To feel clumsy and have poor balance for several hours. °· To have poor judgment for several hours. °· To vomit if you eat too soon. °Follow these instructions at home: °For at least 24 hours after the procedure: ° °· Do not: °? Participate in activities where you could fall or become injured. °? Drive. °? Use heavy machinery. °? Drink alcohol. °? Take sleeping pills or medicines that cause drowsiness. °? Make important decisions or sign legal documents. °? Take care of children on your   own. °· Rest. °Eating and drinking °· Follow the diet recommended by your health care provider. °· If you vomit: °? Drink water, juice, or soup when you can drink without vomiting. °? Make sure you have little or no nausea before eating solid foods. °General instructions °· Have a responsible adult stay with you until you are awake and alert. °· Take over-the-counter and  prescription medicines only as told by your health care provider. °· If you smoke, do not smoke without supervision. °· Keep all follow-up visits as told by your health care provider. This is important. °Contact a health care provider if: °· You keep feeling nauseous or you keep vomiting. °· You feel light-headed. °· You develop a rash. °· You have a fever. °Get help right away if: °· You have trouble breathing. °This information is not intended to replace advice given to you by your health care provider. Make sure you discuss any questions you have with your health care provider. °Document Released: 04/13/2013 Document Revised: 06/05/2017 Document Reviewed: 10/13/2015 °Elsevier Patient Education © 2020 Elsevier Inc. ° °

## 2019-01-06 NOTE — H&P (Signed)
Chief Complaint: Patient was seen in consultation today for random renal biopsy.  Referring Physician(s): [place]  Supervising Physician: Corrie Mckusick  Patient Status: [facility] - Out-pt  History of Present Illness: Anna [name] is a 58 y.o. female with a past medical history significant for depression, hypothyroidism, migraines, stroke, HTN, CHF, lupus and CKD IV who presents today for a random renal biopsy. Patient reports that she is here because "something is wrong with my kidney" - unable to access notes from Dr. Deterding's office however per available records in Epic patient was admitted to [facility] from 08/29/18 - 09/12/18 with pneumonia/sepsis/metabolic encephalopathy requiring intubation as well as acute renal failure thought to be secondary to ATN which required CRRT. At the time of discharge she no longer required dialysis and her creatinine was stable at 2.77. Patient states she is not aware of what her most recent creatinine is or if she has had labs done recently. Difficult to illicit further history from patient today.   Past Medical History:  Diagnosis Date  . Acute CHF ([place]) 07/2016  . Depressive disorder, not elsewhere classified   . Family history of adverse reaction to anesthesia    mother passed away during surgery  . Hypertension   . Hypothyroid    Dr. Wilson Singer  . Kidney stone   . Lupus (systemic lupus erythematosus) ([place])   . Migraine   . Pure hypercholesterolemia   . Stroke [facility])     Past Surgical History:  Procedure Laterality Date  . CESAREAN SECTION    . ECTOPIC PREGNANCY SURGERY     times 2  . JOINT REPLACEMENT    . LAPAROSCOPY     with rt salpingectomy  . [place] SURGERY  2003  . TONSILLECTOMY    . TOTAL ABDOMINAL HYSTERECTOMY  2004   ovaries retained, DUB  . TUBAL LIGATION  1985    Allergies: Patient has no known allergies.  Medications: Prior to Admission medications   Medication Sig Start Date End Date Taking? Authorizing Provider   aspirin EC 325 MG tablet Take 325 mg by mouth daily. 08/13/18 08/13/19 Yes [provider]  atorvastatin (LIPITOR) 20 MG tablet Take 20 mg by mouth daily.   Yes [provider]  calcitRIOL (ROCALTROL) 0.25 MCG capsule Take 0.25 mcg by mouth daily. 04/21/18  Yes [provider]  escitalopram (LEXAPRO) 20 MG tablet TAKE 1 AND 1/2 TABLETS DAILY BY MOUTH 11/12/18  Yes Hurst, Teresa T, PA-C  folic acid (FOLVITE) 1 MG tablet Take 1 mg by mouth daily.   Yes [provider]  hydroxychloroquine (PLAQUENIL) 200 MG tablet Take 200 mg by mouth daily.  02/27/17  Yes [provider]  hydrOXYzine (ATARAX/VISTARIL) 25 MG tablet TAKE 1 TABLET EVERY 8 HOURS AS NEEDED FOR ANXIETY . MAY TAKE 2 AT BEDTIME AS NEEDED 12/01/18  Yes Hurst, Teresa T, PA-C  levothyroxine (SYNTHROID, LEVOTHROID) 50 MCG tablet Take 50 mcg by mouth daily before breakfast.   Yes [provider]  NIFEdipine (ADALAT CC) 30 MG 24 hr tablet Take 1 tablet (30 mg total) by mouth daily. 09/12/18  Yes Vasireddy, Grier Mitts, MD  omeprazole (PRILOSEC) 20 MG capsule Take 20 mg by mouth daily.   Yes [provider]  predniSONE (DELTASONE) 5 MG tablet Take 1 tablet (5 mg total) by mouth daily with breakfast. 09/13/18  Yes Vasireddy, Grier Mitts, MD  QUEtiapine (SEROQUEL) 300 MG tablet TAKE 2 TABLETS (600 MG TOTAL) BY MOUTH AT BEDTIME. 11/12/18  Yes Addison Lank, PA-C  vitamin B-12 (CYANOCOBALAMIN) 1000 MCG tablet Take 1,000 mcg by mouth daily.   Yes [provider]  ferrous sulfate 325 (65 FE) MG tablet Take 1 tablet (325 mg total) by mouth daily. 07/30/18 07/30/19  Shelly Coss, MD  labetalol (NORMODYNE) 200 MG tablet Take 1 tablet (200 mg total) by mouth 2 (two) times daily. 09/12/18   Monica Becton, MD  ondansetron (ZOFRAN) 4 MG tablet Take 1 tablet (4 mg total) by mouth every 6 (six) hours as needed for nausea. 02/13/18   Mariel Aloe, MD  polyethylene glycol (MIRALAX / GLYCOLAX) packet Take 17  g by mouth 2 (two) times daily. Patient taking differently: Take 17 g by mouth daily as needed (constipation).  02/13/18   Mariel Aloe, MD  sennosides (SENOKOT) 8.8 MG/5ML syrup Take 5 mLs by mouth 2 (two) times daily. 09/12/18   Monica Becton, MD  sodium bicarbonate 650 MG tablet Take 1,300 mg by mouth 2 (two) times daily.    [provider]  Tiotropium Bromide Monohydrate (SPIRIVA RESPIMAT) 1.25 MCG/ACT AERS Inhale 2 puffs into the lungs daily. 09/15/18   Martyn Ehrich, NP     Family History  Problem Relation Age of Onset  . Diabetes Mother   . Hypertension Sister   . Diabetes Maternal Grandmother   . Autoimmune disease Neg Hx     Social History   Socioeconomic History  . Marital status: Married    Spouse name: Ludwig Clarks  . Number of children: 1  . Years of education: 55  . Highest education level: Not on file  Occupational History  . Occupation: disability    Employer: UNEMPLOYED  Social Needs  . Financial resource strain: Not on file  . Food insecurity    Worry: Not on file    Inability: Not on file  . Transportation needs    Medical: Not on file    Non-medical: Not on file  Tobacco Use  . Smoking status: Current Some Day Smoker    Packs/day: 0.50    Years: 35.00    Pack years: 17.50    Types: Cigarettes    Last attempt to quit: 08/29/2018    Years since quitting: 0.3  . Smokeless tobacco: Never Used  . Tobacco comment: Continued cessation encouraged  Substance and Sexual Activity  . Alcohol use: Yes    Comment: 1 drink every 3-4 months.  . Drug use: No  . Sexual activity: Yes    Partners: Male    Birth control/protection: Surgical    Comment: TAH  Lifestyle  . Physical activity    Days per week: Not on file    Minutes per session: Not on file  . Stress: Not on file  Relationships  . Social Herbalist on phone: Not on file    Gets together: Not on file    Attends religious service: Not on file    Active member of club or  organization: Not on file    Attends meetings of clubs or organizations: Not on file    Relationship status: Not on file  Other Topics Concern  . 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   Patient has one son, lives in [place].   Patient is disabled.   Patient has a college education.   Patient is right-handed.   Patient drinks some caffeine.     Review of Systems: A 12 point ROS discussed  and pertinent positives are indicated in the HPI above.  All other systems are negative.  Review of Systems  Constitutional: Negative for chills and fever.  Respiratory: Negative for cough and shortness of breath.   Cardiovascular: Negative for chest pain.  Gastrointestinal: Negative for abdominal pain, diarrhea, nausea and vomiting.  Genitourinary: Negative for dysuria and hematuria.  Musculoskeletal: Negative for back pain.  Skin: Negative for rash.  Neurological: Negative for dizziness and syncope.    Vital Signs: BP (!) 160/113   Pulse 100   Temp 97.9 F (36.6 C) (Oral)   Resp 16   Ht 5\' 7"  (1.702 m)   Wt 161 lb (73 kg)   LMP 07/07/2002 (Exact Date)   SpO2 95%   BMI 25.22 kg/m   Physical Exam Vitals signs reviewed.  Constitutional:      Comments: Patient sleeping on entry to room - arouses to loud verbal cues but is very somnolent throughout exam. She tells me she had a hard time sleeping last night.  HENT:     Head: Normocephalic.  Cardiovascular:     Rate and Rhythm: Normal rate and regular rhythm.  Pulmonary:     Effort: Pulmonary effort is normal.     Breath sounds: Normal breath sounds.  Abdominal:     General: Bowel sounds are normal. There is no distension.     Palpations: Abdomen is soft.     Tenderness: There is no abdominal tenderness.  Skin:    General: Skin is warm and dry.  Neurological:     Mental Status: She is alert and oriented to person, place, and time.  Psychiatric:        Mood and  Affect: Mood normal.        Behavior: Behavior normal.        Thought Content: Thought content normal.        Judgment: Judgment normal.      MD Evaluation Airway: WNL Heart: WNL Abdomen: WNL Chest/ Lungs: WNL ASA  Classification: 2 Mallampati/Airway Score: Two   Imaging: No results found.  Labs:  CBC: Recent Labs    09/08/18 0301 09/11/18 0317 09/12/18 0412 12/02/18 1231 12/23/18 1228 01/06/19 0557  WBC 5.6 5.1 5.5  --   --  7.4  HGB 9.8* 8.9* 8.7* 10.0* 11.9* 12.3  HCT 28.5* 27.3* 27.2*  --   --  36.6  PLT 189 217 213  --   --  231    COAGS: Recent Labs    08/29/18 0747  09/03/18 0347 09/04/18 0430 09/05/18 0250 09/06/18 0422 01/06/19 0557  INR 1.46  --   --   --   --   --  1.2  APTT  --    < > 180* 134* 41* 40*  --    < > = values in this interval not displayed.    BMP: Recent Labs    09/09/18 0401 09/10/18 0235 09/11/18 0317 09/12/18 0412  NA 143 144 144 142  K 4.8 4.9 4.4 4.2  CL 109 117* 114* 113*  CO2 23 20* 20* 20*  GLUCOSE 76 94 86 105*  BUN 69* 58* 48* 38*  CALCIUM 8.9 8.7* 8.7* 8.3*  CREATININE 2.59* 2.69* 2.72* 2.77*  GFRNONAA 19* 19* 18* 18*  GFRAA 23* 22* 21* 21*    LIVER FUNCTION TESTS: Recent Labs    02/12/18 1213 06/15/18 1638 07/23/18 0954 08/29/18 0747  09/06/18 0422 09/09/18 0401 09/10/18 0235 09/11/18 0317  BILITOT 0.3 0.5 0.3 0.5  --   --   --   --   --  AST 21 41 30 15  --   --   --   --   --   ALT 15 18 17 11   --   --   --   --   --   ALKPHOS 74 77 63 69  --   --   --   --   --   PROT 7.4 7.4 7.4 6.0*  --   --   --   --   --   ALBUMIN 3.8 3.7 3.9 2.2*   < > 2.1* 2.4* 2.4* 2.4*   < > = values in this interval not displayed.    TUMOR MARKERS: No results for input(s): AFPTM, CEA, CA199, CHROMGRNA in the last 8760 hours.  Assessment and Plan:  58 y/o F with history of CKD IV followed by Dr. Jimmy Footman who has requested a random renal biopsy today due to worsening kidney function. Limited history is  available per chart and patient is somewhat of a poor historian as noted above. No recent renal labs in Epic for review.   Patient reports drinking a small amount of fruit juice this morning - Dr. Earleen Newport aware, prior to that her last PO intake was around 7 pm yesterday, she does not take blood thinning medications and she did not take any of her regular medications this morning. Afebrile, WBC 7.4, hgb 12.3, plt 231, INR 1.1.  Risks and benefits discussed with the patient including, but not limited to bleeding, infection, damage to adjacent structures or low yield requiring additional tests.  All of the patient's questions were answered, patient is agreeable to proceed.  Consent signed and in chart.  Thank you for this interesting consult.  I greatly enjoyed meeting Anna [name] and look forward to participating in their care.  A copy of this report was sent to the requesting provider on this date.  Electronically Signed: Joaquim Nam, PA-C 01/06/2019, 7:25 AM   I spent a total of30 Minutes   in face to face in clinical consultation, greater than 50% of which was counseling/coordinating care for random renal biopsy.

## 2019-01-13 ENCOUNTER — Other Ambulatory Visit: Payer: Self-pay

## 2019-01-13 ENCOUNTER — Ambulatory Visit (HOSPITAL_COMMUNITY)
Admission: RE | Admit: 2019-01-13 | Discharge: 2019-01-13 | Disposition: A | Discharge status: Home / Self Care | Payer: BC Managed Care – PPO | Source: Ambulatory Visit | Attending: Nephrology | Admitting: Nephrology

## 2019-01-13 DIAGNOSIS — N183 Chronic kidney disease, stage 3 unspecified: Secondary | ICD-10-CM

## 2019-01-13 MED ORDER — EPOETIN ALFA-EPBX 40000 UNIT/ML IJ SOLN
30000.0000 [IU] | INTRAMUSCULAR | Status: DC
  Filled 2019-01-13: qty 1

## 2019-01-13 NOTE — Progress Notes (Addendum)
Pt arrived for Retacrit injection and her blood pressure was too high it did not meet parameters. 142/113 Pt refused to wait for [place] to reassess, call MD or give clonidine as she had her dog in her car. She stated that her doctor just took her of of her labetalol and she will call him and see if she needs to start back on it.  States is feeling fine and denies headache or other symptoms. Rescheduled for next Tuesday.  [facility] called and message left with Amber with above information.

## 2019-01-18 ENCOUNTER — Ambulatory Visit: Payer: BLUE CROSS/BLUE SHIELD | Admitting: Primary Care

## 2019-01-18 ENCOUNTER — Encounter (HOSPITAL_COMMUNITY)
Admission: RE | Admit: 2019-01-18 | Discharge: 2019-01-18 | Disposition: A | Discharge status: Home / Self Care | Payer: BC Managed Care – PPO | Source: Ambulatory Visit | Attending: Nephrology | Admitting: Nephrology

## 2019-01-18 ENCOUNTER — Other Ambulatory Visit: Payer: Self-pay

## 2019-01-18 VITALS — BP 138/101 | HR 118 | Temp 94.4°F | Resp 18

## 2019-01-18 DIAGNOSIS — N183 Chronic kidney disease, stage 3 unspecified: Secondary | ICD-10-CM

## 2019-01-18 LAB — POCT HEMOGLOBIN-HEMACUE: Hemoglobin: 13.7 g/dL (ref 12.0–15.0)

## 2019-01-18 MED ORDER — EPOETIN ALFA-EPBX 40000 UNIT/ML IJ SOLN
30000.0000 [IU] | INTRAMUSCULAR | Status: DC
  Filled 2019-01-18: qty 1

## 2019-01-19 ENCOUNTER — Encounter (HOSPITAL_COMMUNITY): Payer: Self-pay

## 2019-02-01 ENCOUNTER — Other Ambulatory Visit: Payer: Self-pay

## 2019-02-01 ENCOUNTER — Ambulatory Visit (HOSPITAL_COMMUNITY)
Admission: RE | Admit: 2019-02-01 | Discharge: 2019-02-01 | Disposition: A | Discharge status: Home / Self Care | Payer: BC Managed Care – PPO | Source: Ambulatory Visit | Attending: Nephrology | Admitting: Nephrology

## 2019-02-01 VITALS — BP 125/93 | HR 90 | Temp 96.0°F | Resp 18

## 2019-02-01 DIAGNOSIS — N183 Chronic kidney disease, stage 3 unspecified: Secondary | ICD-10-CM

## 2019-02-01 LAB — POCT HEMOGLOBIN-HEMACUE: Hemoglobin: 13 g/dL (ref 12.0–15.0)

## 2019-02-01 MED ORDER — EPOETIN ALFA-EPBX 40000 UNIT/ML IJ SOLN
30000.0000 [IU] | INTRAMUSCULAR | Status: DC
  Filled 2019-02-01: qty 1

## 2019-02-03 ENCOUNTER — Encounter (HOSPITAL_COMMUNITY): Payer: BC Managed Care – PPO

## 2019-02-07 ENCOUNTER — Telehealth: Payer: Self-pay | Admitting: Physician Assistant

## 2019-02-07 ENCOUNTER — Other Ambulatory Visit: Payer: Self-pay | Admitting: Physician Assistant

## 2019-02-07 NOTE — Telephone Encounter (Signed)
Anna [name] made appt for 03/16/19. Would like Hydroxyzine called in.

## 2019-02-07 NOTE — Telephone Encounter (Signed)
Refill sent.

## 2019-02-08 ENCOUNTER — Encounter (HOSPITAL_COMMUNITY): Payer: BC Managed Care – PPO

## 2019-02-15 ENCOUNTER — Other Ambulatory Visit: Payer: Self-pay

## 2019-02-15 ENCOUNTER — Encounter (HOSPITAL_COMMUNITY)
Admission: RE | Admit: 2019-02-15 | Discharge: 2019-02-15 | Disposition: A | Discharge status: Home / Self Care | Payer: BC Managed Care – PPO | Source: Ambulatory Visit | Attending: Nephrology | Admitting: Nephrology

## 2019-02-15 VITALS — BP 127/98 | HR 110 | Temp 95.4°F | Resp 18

## 2019-02-15 DIAGNOSIS — N183 Chronic kidney disease, stage 3 unspecified: Secondary | ICD-10-CM

## 2019-02-15 LAB — POCT HEMOGLOBIN-HEMACUE: Hemoglobin: 12.7 g/dL (ref 12.0–15.0)

## 2019-02-15 LAB — IRON AND TIBC
Iron: 99 ug/dL (ref 28–170)
Saturation Ratios: 32 % — ABNORMAL HIGH (ref 10.4–31.8)
TIBC: 307 ug/dL (ref 250–450)
UIBC: 208 ug/dL

## 2019-02-15 LAB — FERRITIN: Ferritin: 113 ng/mL (ref 11–307)

## 2019-02-15 MED ORDER — EPOETIN ALFA-EPBX 40000 UNIT/ML IJ SOLN
30000.0000 [IU] | INTRAMUSCULAR | Status: DC
  Filled 2019-02-15: qty 1

## 2019-02-19 IMAGING — DX DG CHEST 1V PORT
1 series · 1 of 1 positions shown · non-contrast
Comparison: Chest radiograph and CT 06/15/2018

CLINICAL DATA: Altered level of consciousness. Dialysis.

EXAM:
PORTABLE CHEST 1 VIEW

[chest]
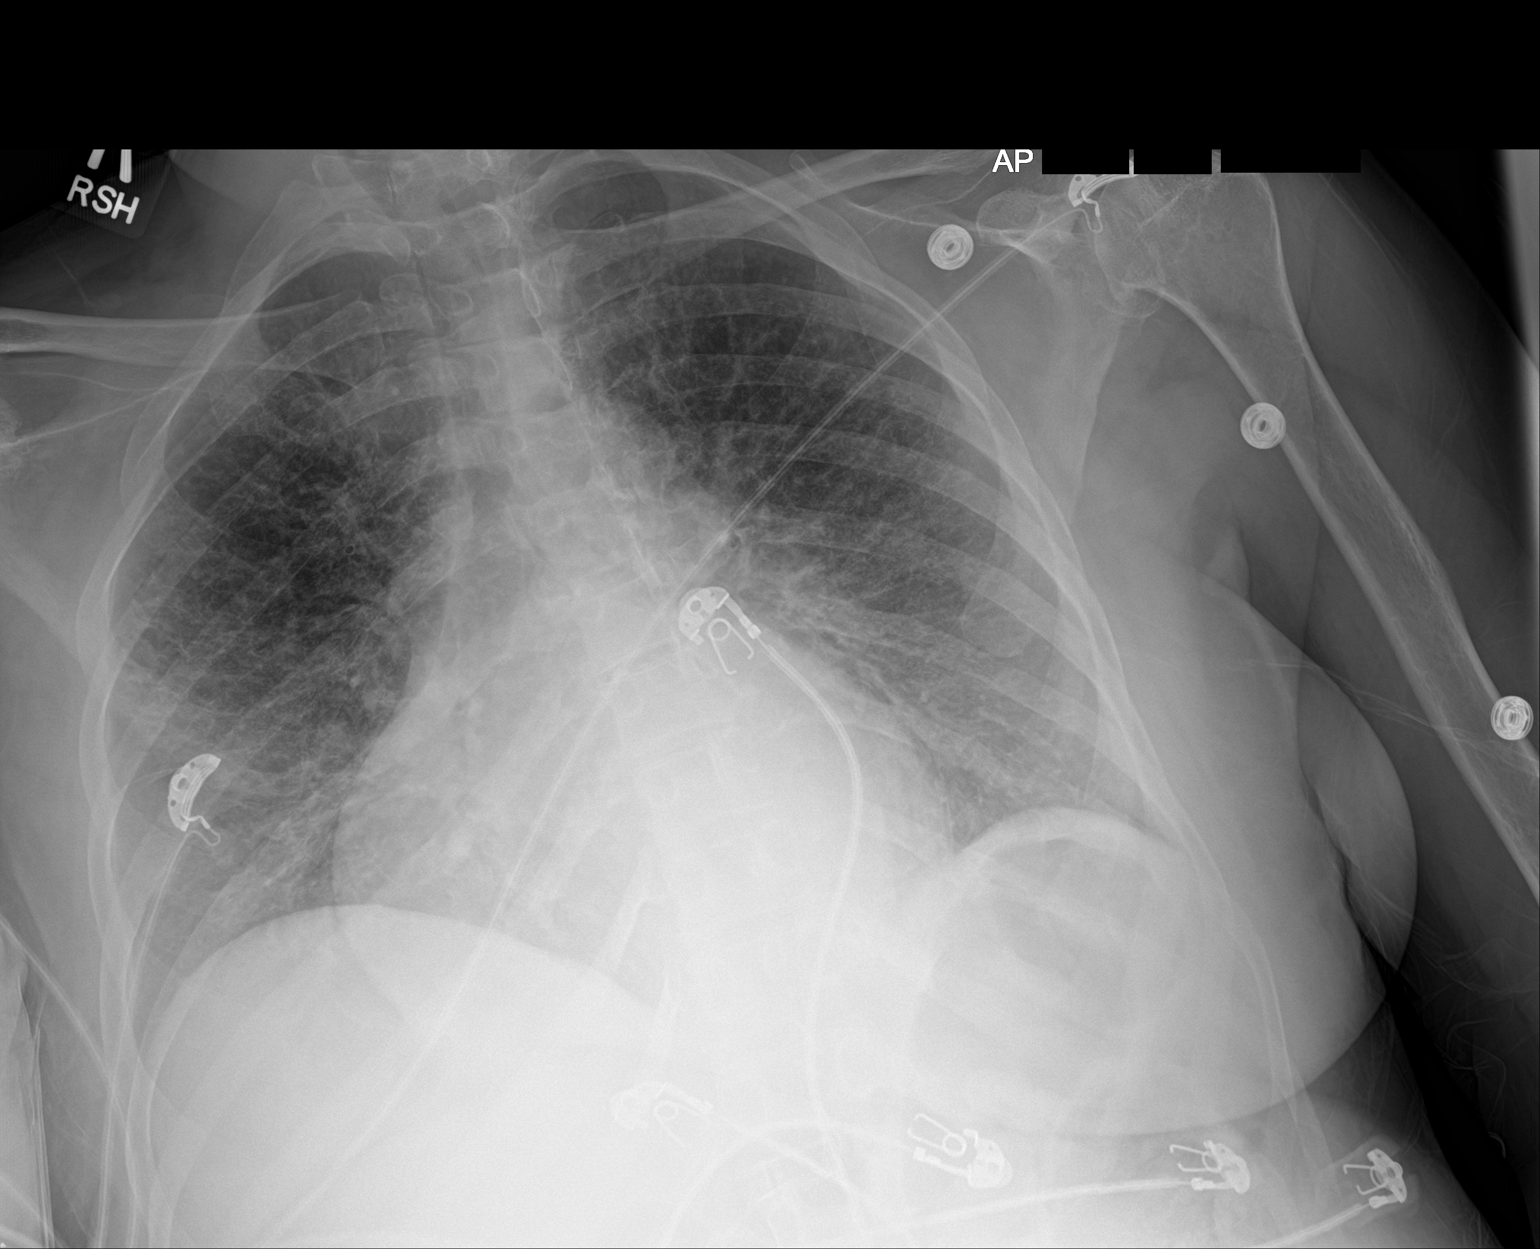

[1 of 1 positions shown; findings below may reference images not displayed]

FINDINGS: The patient is rotated to the right. The cardiac silhouette remains
enlarged. Diffuse interstitial densities are unchanged from the
prior radiograph though increased from older radiographs of
02/02/2018. No sizable pleural effusion or pneumothorax is
identified.
IMPRESSION: Cardiomegaly with unchanged interstitial densities which could
reflect mild edema or atypical infection superimposed on chronic
lung disease.

## 2019-02-22 ENCOUNTER — Encounter (HOSPITAL_COMMUNITY): Payer: BC Managed Care – PPO

## 2019-02-22 IMAGING — US US RENAL
1 series · 14 of 25 positions shown · non-contrast
Comparison: Prior CT from 02/12/2018

CLINICAL DATA: Initial evaluation for acute renal injury

EXAM:
RENAL / URINARY TRACT ULTRASOUND COMPLETE

[Series 1: us renal · 14 of 42 slices shown]
[im 1/42]
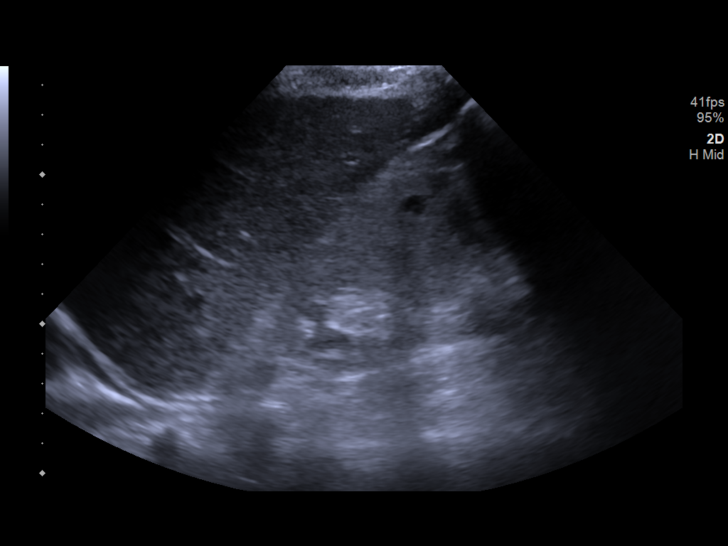
[im 4/42]
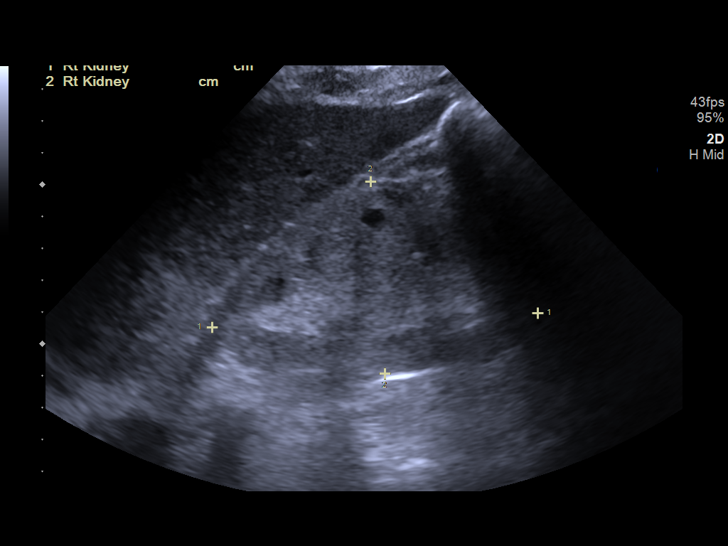
[im 7/42]
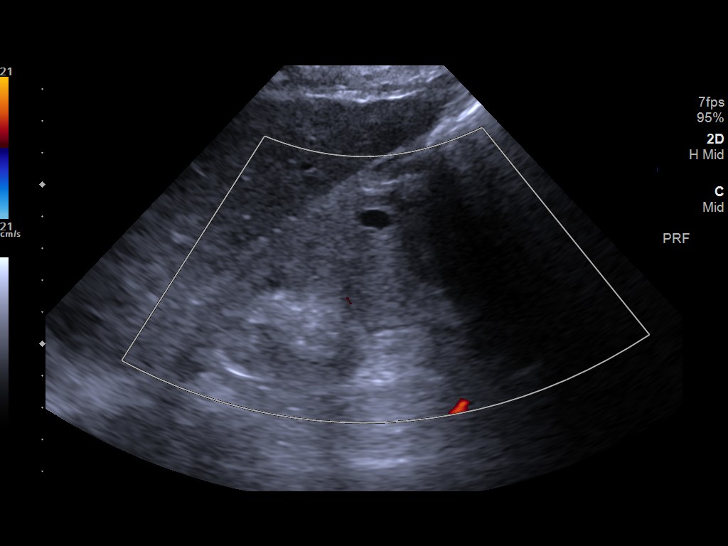
[im 11/42]
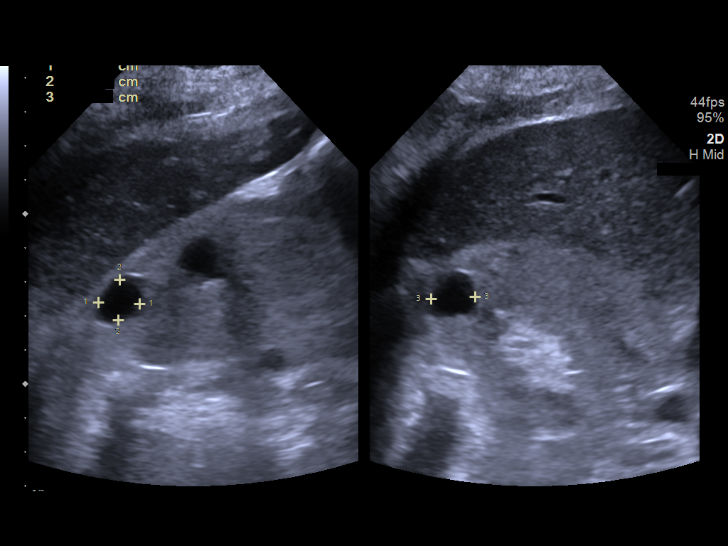
[im 14/42]
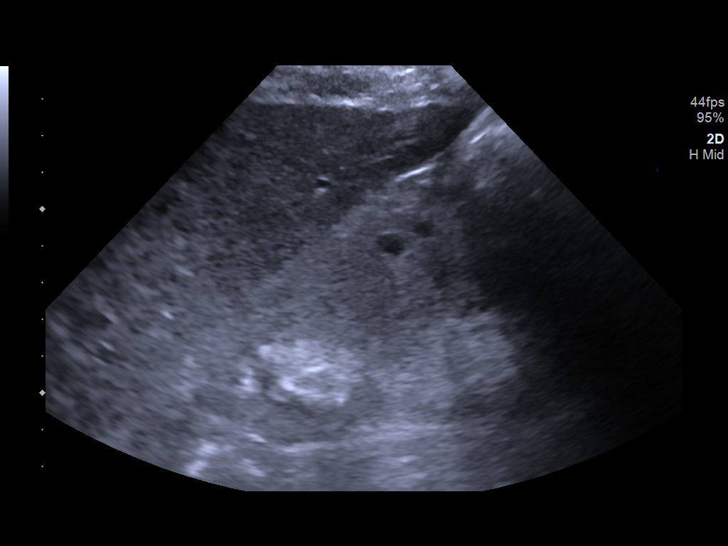
[im 16/42]
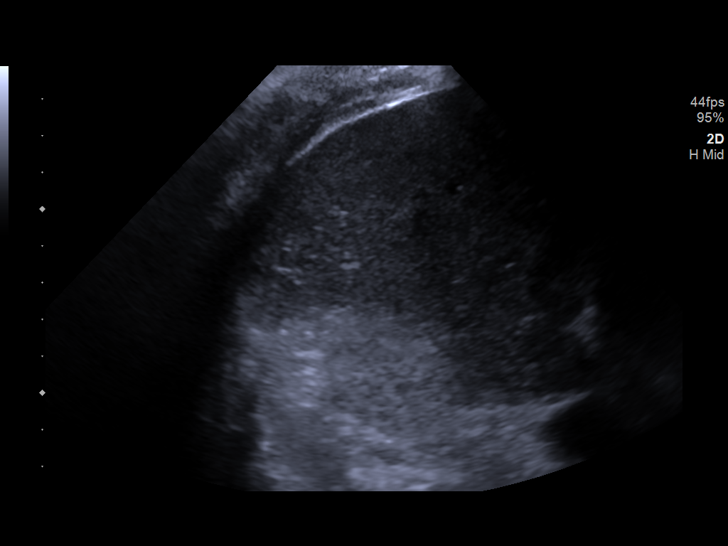
[im 19/42]
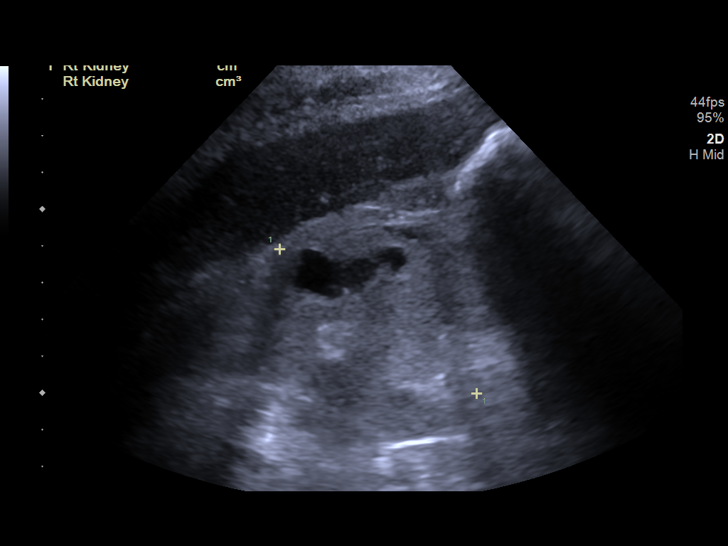
[im 23/42]
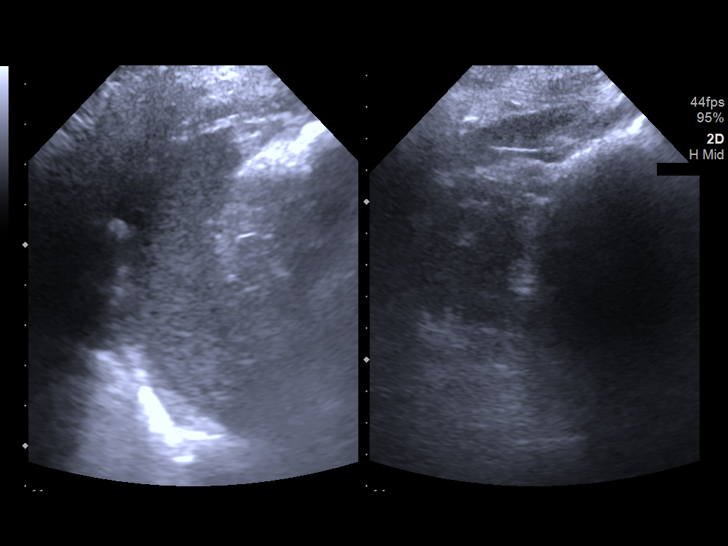
[im 26/42]
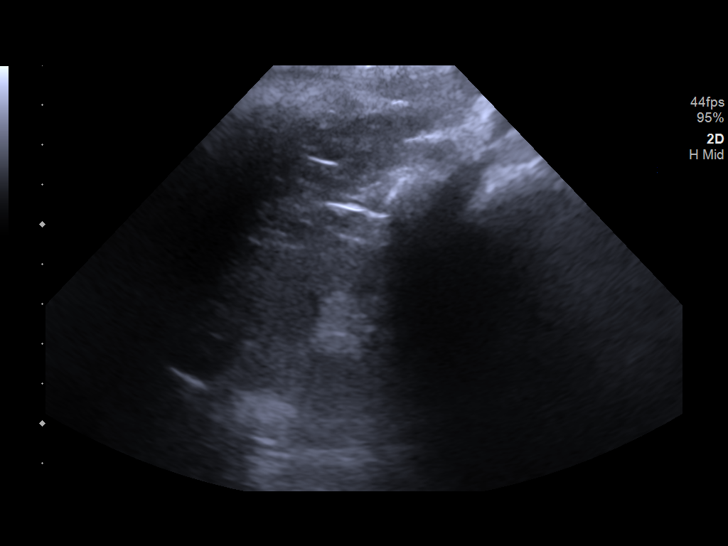
[im 28/42]
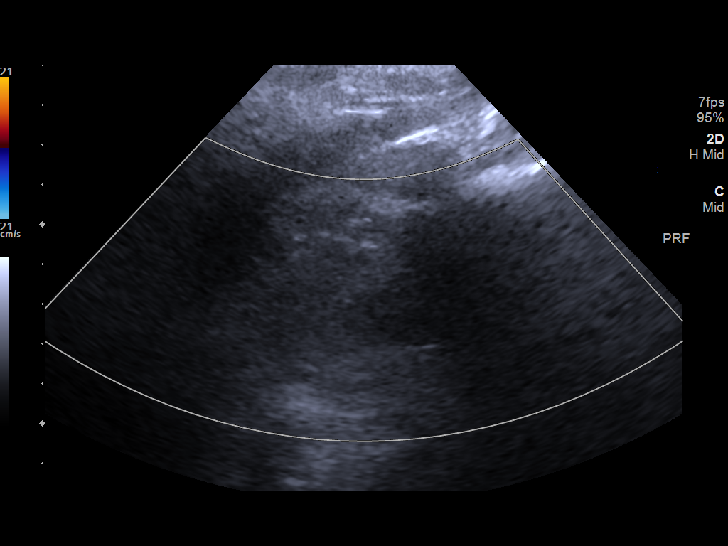
[im 31/42]
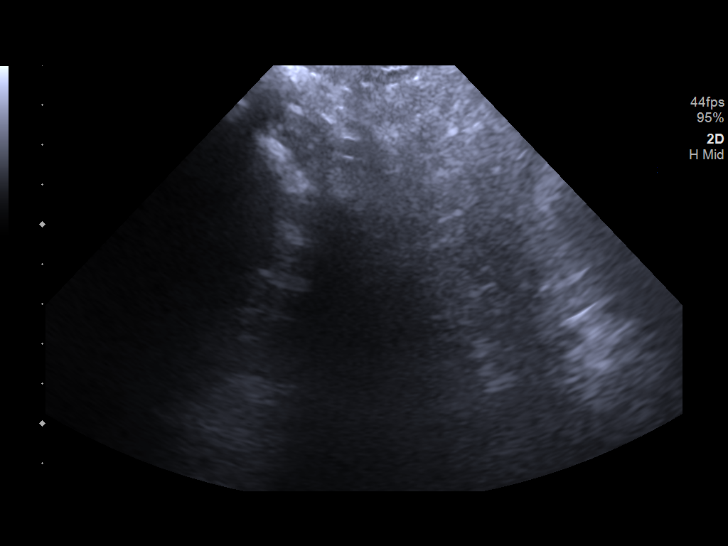
[im 35/42]
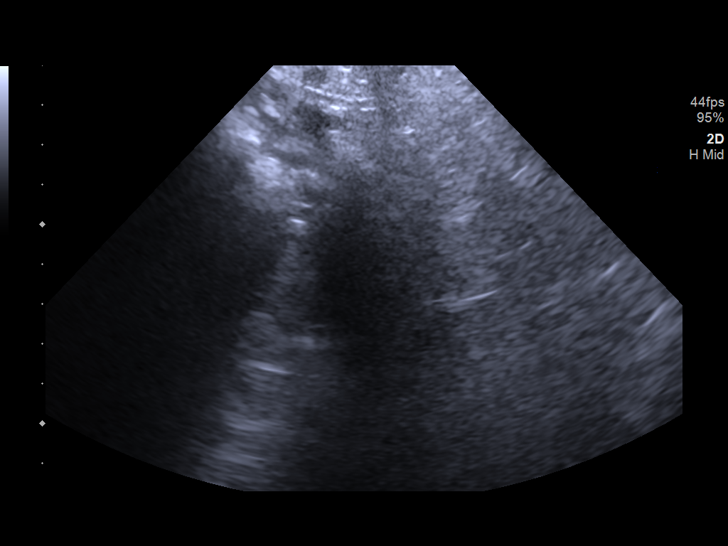
[im 38/42]
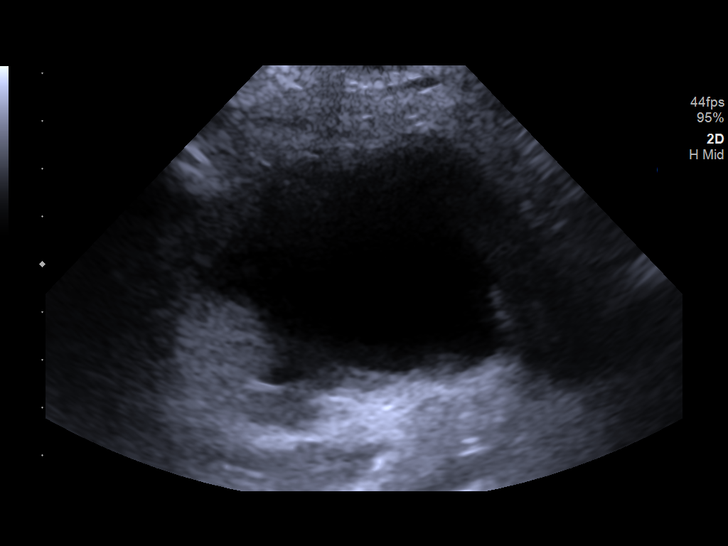
[im 42/42]
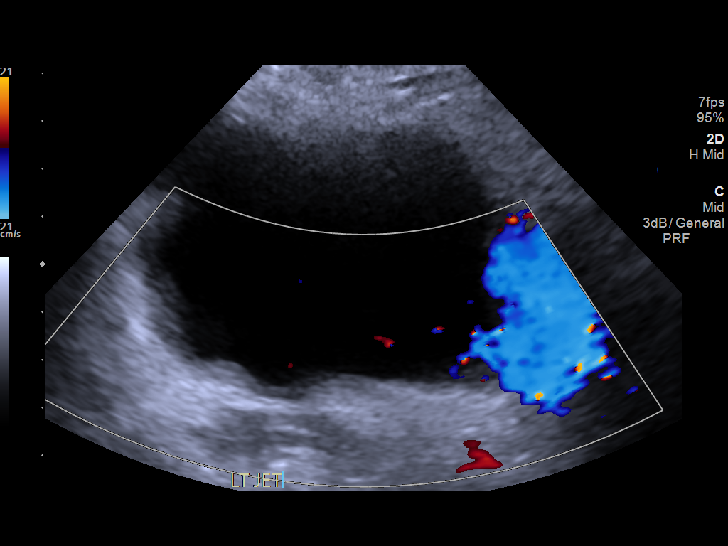

[14 of 25 positions shown; findings below may reference images not displayed]

FINDINGS: Right Kidney:

Renal measurements: 10.3 x 6.1 x 6.7 cm = volume: 214.8 mL.
Diffusely increased echogenicity, compatible with medical renal
disease. No hydronephrosis. 7 x 6 x 10 mm simple cyst present at the
interpolar region. Few small cyst present at the lower pole the
right kidney, largest measuring 1.2 x 1.2 x 1.3 cm.

Left Kidney:

Renal measurements: 8.7 x 4.3 x 4.2 cm = volume: 82.6 mL. Diffusely
increased echogenicity, compatible with medical renal disease. No
hydronephrosis. No focal renal mass.

Bladder:

Appears normal for degree of bladder distention. Bilateral ureteral
jets visualized.
IMPRESSION: 1. Diffusely increased echogenicity within the renal parenchyma,
compatible with medical renal disease.
2. No hydronephrosis.
3. Simple right renal cysts as above.

## 2019-02-23 IMAGING — DX DG CHEST 2V
2 series · 2 of 2 positions shown · non-contrast
Comparison: CT 07/26/2017.

CLINICAL DATA: Respiratory failure.

EXAM:
CHEST - 2 VIEW

[chest lat]
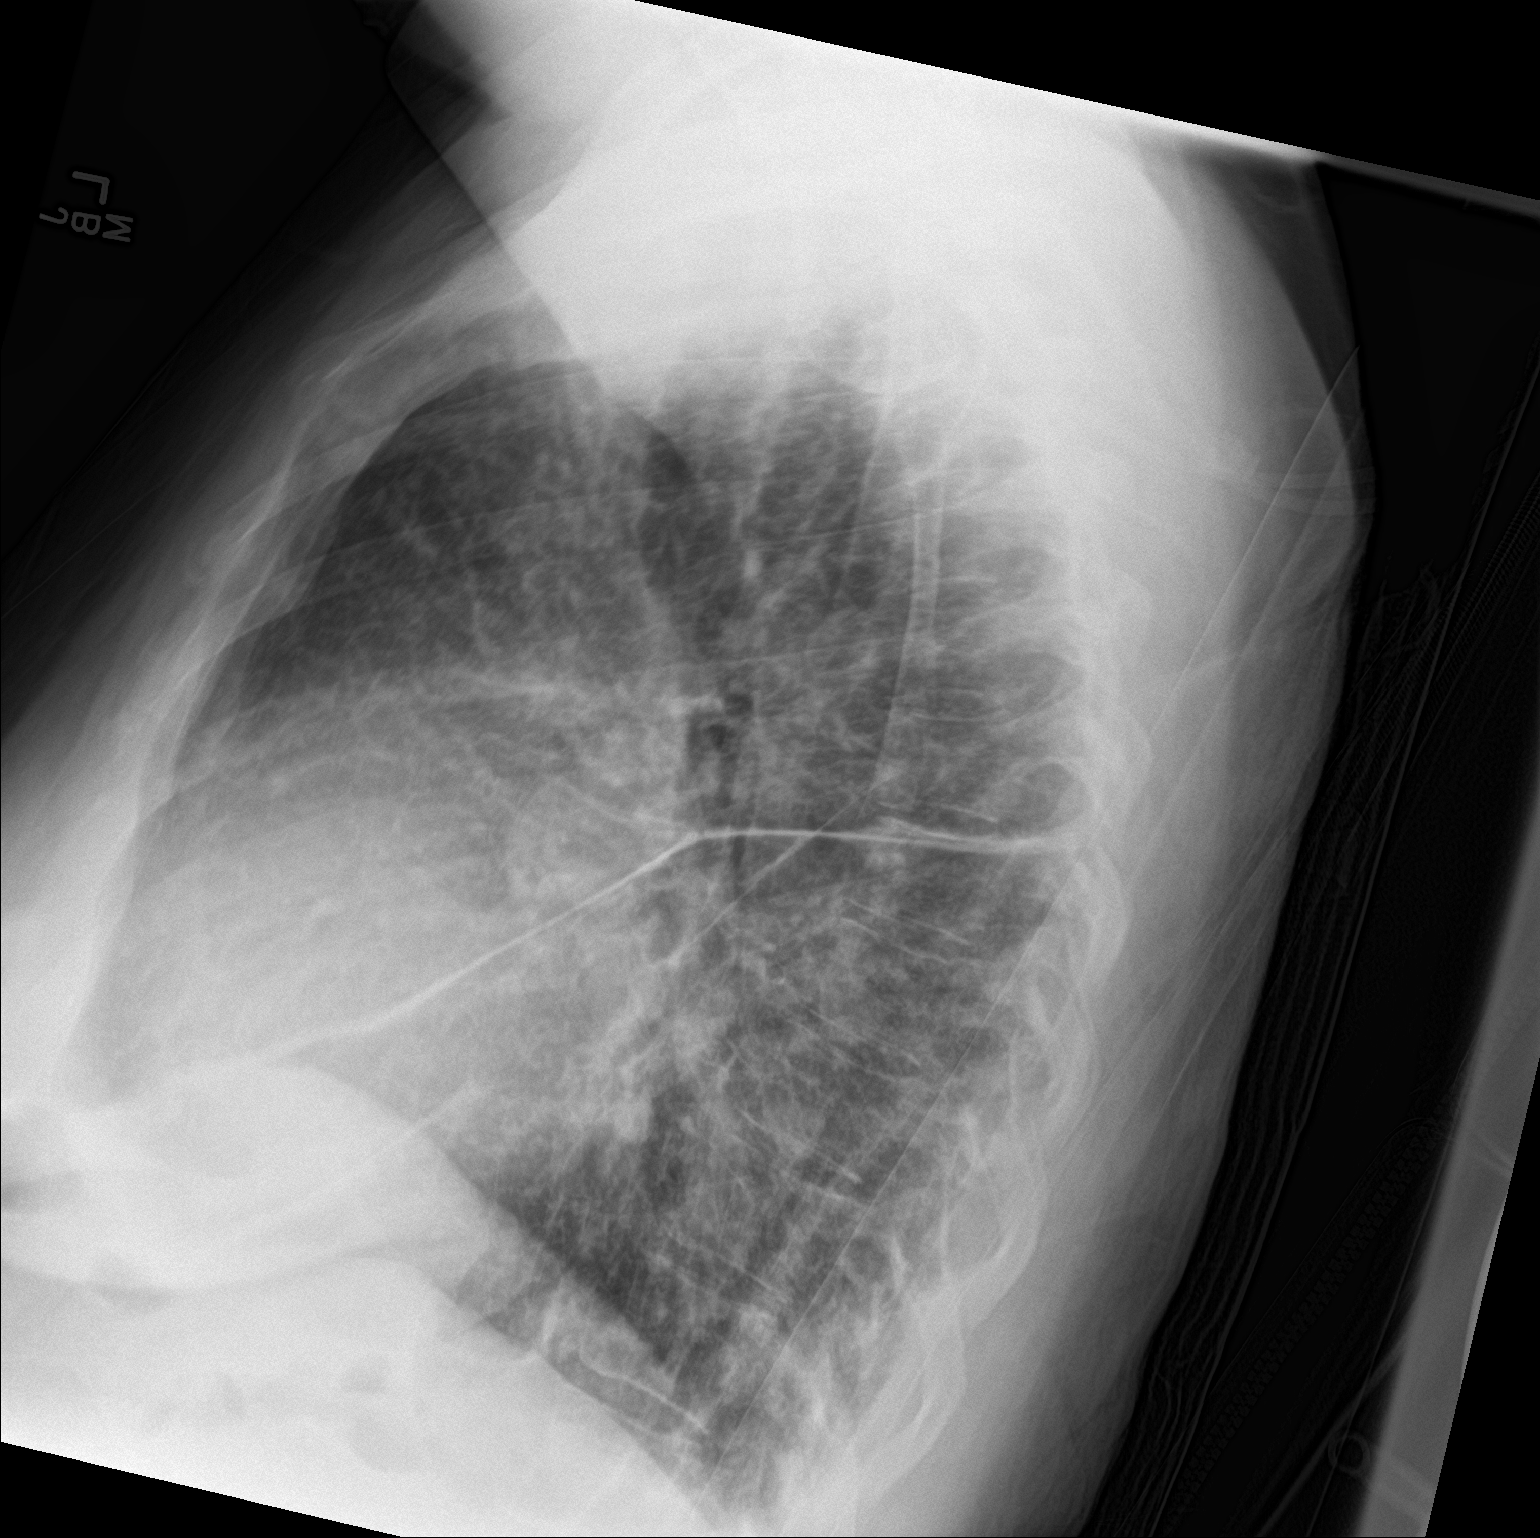

[chest ap]
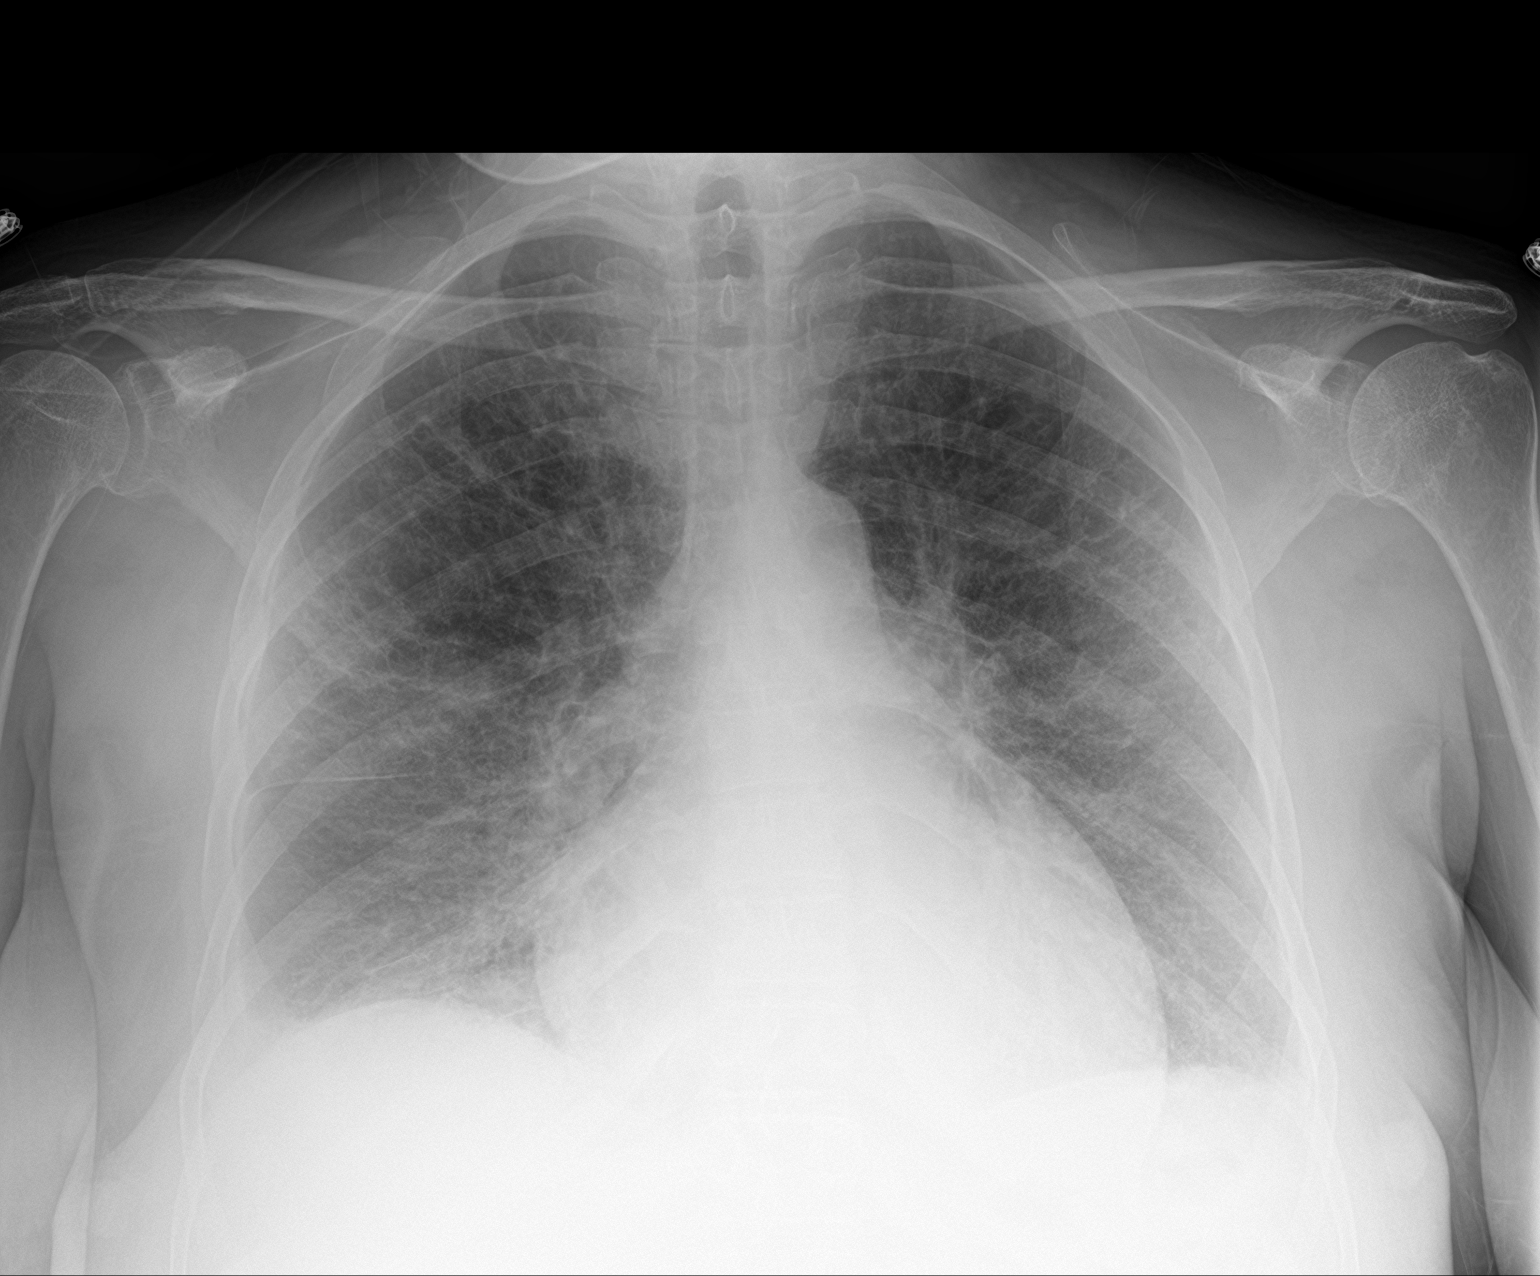

[2 of 2 positions shown; findings below may reference images not displayed]

FINDINGS: Mediastinum and hilar structures normal. Cardiomegaly with diffuse
bilateral pulmonary interstitial prominence and small bilateral
pleural effusions. No pneumothorax. No acute bony abnormality.
IMPRESSION: Cardiomegaly with diffuse bilateral pulmonary interstitial
prominence and small bilateral pleural effusions again noted.
Similar findings on prior exams. Again CHF and/or pneumonitis could
present in this fashion.

## 2019-03-01 ENCOUNTER — Other Ambulatory Visit: Payer: Self-pay

## 2019-03-01 ENCOUNTER — Ambulatory Visit (HOSPITAL_COMMUNITY)
Admission: RE | Admit: 2019-03-01 | Discharge: 2019-03-01 | Disposition: A | Discharge status: Home / Self Care | Payer: BC Managed Care – PPO | Source: Ambulatory Visit | Attending: Nephrology | Admitting: Nephrology

## 2019-03-01 VITALS — BP 164/109 | HR 112 | Temp 96.0°F | Resp 18

## 2019-03-01 DIAGNOSIS — N183 Chronic kidney disease, stage 3 unspecified: Secondary | ICD-10-CM

## 2019-03-01 LAB — POCT HEMOGLOBIN-HEMACUE: Hemoglobin: 12.4 g/dL (ref 12.0–15.0)

## 2019-03-01 MED ORDER — EPOETIN ALFA-EPBX 40000 UNIT/ML IJ SOLN
30000.0000 [IU] | INTRAMUSCULAR | Status: DC
  Filled 2019-03-01: qty 1

## 2019-03-08 ENCOUNTER — Encounter (HOSPITAL_COMMUNITY): Payer: BC Managed Care – PPO

## 2019-03-15 ENCOUNTER — Ambulatory Visit (HOSPITAL_COMMUNITY)
Admission: RE | Admit: 2019-03-15 | Discharge: 2019-03-15 | Disposition: A | Discharge status: Home / Self Care | Payer: BC Managed Care – PPO | Source: Ambulatory Visit | Attending: Nephrology | Admitting: Nephrology

## 2019-03-15 ENCOUNTER — Other Ambulatory Visit: Payer: Self-pay

## 2019-03-15 VITALS — BP 136/95 | HR 115 | Temp 98.5°F

## 2019-03-15 DIAGNOSIS — N183 Chronic kidney disease, stage 3 unspecified: Secondary | ICD-10-CM

## 2019-03-15 LAB — IRON AND TIBC
Iron: 112 ug/dL (ref 28–170)
Saturation Ratios: 39 % — ABNORMAL HIGH (ref 10.4–31.8)
TIBC: 288 ug/dL (ref 250–450)
UIBC: 176 ug/dL

## 2019-03-15 LAB — POCT HEMOGLOBIN-HEMACUE: Hemoglobin: 13.9 g/dL (ref 12.0–15.0)

## 2019-03-15 LAB — FERRITIN: Ferritin: 108 ng/mL (ref 11–307)

## 2019-03-15 MED ORDER — EPOETIN ALFA-EPBX 40000 UNIT/ML IJ SOLN: 30000.0000 [IU] | INTRAMUSCULAR | Status: DC

## 2019-03-16 ENCOUNTER — Encounter: Payer: Self-pay | Admitting: Physician Assistant

## 2019-03-16 ENCOUNTER — Ambulatory Visit (INDEPENDENT_AMBULATORY_CARE_PROVIDER_SITE_OTHER): Payer: BC Managed Care – PPO | Admitting: Physician Assistant

## 2019-03-16 ENCOUNTER — Encounter

## 2019-03-16 ENCOUNTER — Other Ambulatory Visit: Payer: Self-pay

## 2019-03-16 DIAGNOSIS — F319 Bipolar disorder, unspecified: Secondary | ICD-10-CM

## 2019-03-16 DIAGNOSIS — F411 Generalized anxiety disorder: Secondary | ICD-10-CM | POA: Diagnosis not present

## 2019-03-16 DIAGNOSIS — F4321 Adjustment disorder with depressed mood: Secondary | ICD-10-CM

## 2019-03-16 DIAGNOSIS — G4700 Insomnia, unspecified: Secondary | ICD-10-CM

## 2019-03-16 MED ORDER — QUETIAPINE FUMARATE 300 MG PO TABS: 900.0000 mg | ORAL_TABLET | Freq: Every day | ORAL | 1 refills | Status: DC

## 2019-03-16 MED ORDER — HYDROXYZINE HCL 50 MG PO TABS: 50.0000 mg | ORAL_TABLET | Freq: Three times a day (TID) | ORAL | 0 refills | Status: DC | PRN

## 2019-03-16 NOTE — Progress Notes (Signed)
[facility]  Patient ID: Anna [name],  MRN: [id number]  PCP: [name], MD  Date of Evaluation: 03/16/2019 Time spent:25 minutes  Chief Complaint:  Chief Complaint    Depression; Anxiety; Insomnia; Follow-up     Virtual Visit via Telephone Note  I connected with patient by a video enabled telemedicine application or telephone, with their informed consent, and verified patient privacy and that I am speaking with the correct person using two identifiers.  I am private, in my home and the patient is home.   I discussed the limitations, risks, security and privacy concerns of performing an evaluation and management service by telephone and the availability of in person appointments. I also discussed with the patient that there may be a patient responsible charge related to this service. The patient expressed understanding and agreed to proceed.   I discussed the assessment and treatment plan with the patient. The patient was provided an opportunity to ask questions and all were answered. The patient agreed with the plan and demonstrated an understanding of the instructions.   The patient was advised to call back or seek an in-person evaluation if the symptoms worsen or if the condition fails to improve as anticipated.  I provided 25 minutes of non-face-to-face time during this encounter.  HISTORY/CURRENT STATUS: HPI For routine f/u but not doing well.  She and husband aren't getting along well.  "He cusses me and rolls his eyes. It's got my nerves tore up."  Denies physical abuse.  She is not afraid of him physically.  Having a lot of physical problems w/ Lupus, chronic kidney disease, HTN. Has to go to the hospital every 2 wks for tests. C/o swelling of her face and stomach.   Sad b/c she can't do the things she used to do.  Both around the house, with others, having her sense of humor, etc.  Energy and motivation are low, but it's partly d/t physical problems, as  well as emotional problems.  She is isolating a lot for several reasons.  One is because of the depression but also because of the coronavirus pandemic, she is staying away from other people as much as possible.  She does not need to be exposed to it and get sick.  Patient denies increased energy with decreased need for sleep, no increased talkativeness, no racing thoughts, no impulsivity or risky behaviors, no increased spending, no increased libido, no grandiosity.  Anxiety is really bad right now.  She feels jittery on the inside off and on throughout the day.  The hydroxyzine used to work but it does not anymore.  States that she gets so anxious sometimes she literally shakes for a few minutes.  She was on Valium until this last winter when she was hospitalized for pneumonia and respiratory failure.  She told me back then that she was taken off of the Valium due to kidney failure.  Denies dizziness, syncope, seizures, numbness, tingling, tremor, tics, unsteady gait, slurred speech, confusion. Denies muscle or joint pain, stiffness, or dystonia.  Individual Medical History/ Review of Systems: Changes? :No  No fevers, chills, weight loss, night sweats.  No earache, sore throat, respiratory symptoms, no worsening of cough or shortness of breath, no chest pain, no pain in arms or her jaws, no abdominal pain, nausea, vomiting, constipation, or diarrhea.  No urinary symptoms.  Past medications for mental health diagnoses include: Valium and others but unknown names  Allergies: Patient has no known allergies.  Current Medications:  Current Outpatient Medications:  .  aspirin EC 325 MG tablet, Take 325 mg by mouth daily., Disp: , Rfl:  .  atorvastatin (LIPITOR) 20 MG tablet, Take 20 mg by mouth daily., Disp: , Rfl:  .  calcitRIOL (ROCALTROL) 0.25 MCG capsule, Take 0.25 mcg by mouth daily., Disp: , Rfl: 6 .  escitalopram (LEXAPRO) 20 MG tablet, TAKE 1 AND 1/2 TABLETS DAILY BY MOUTH, Disp: 135 tablet,  Rfl: 1 .  ferrous sulfate 325 (65 FE) MG tablet, Take 1 tablet (325 mg total) by mouth daily., Disp: 30 tablet, Rfl: 3 .  folic acid (FOLVITE) 1 MG tablet, Take 1 mg by mouth daily., Disp: , Rfl:  .  hydroxychloroquine (PLAQUENIL) 200 MG tablet, Take 200 mg by mouth daily. , Disp: , Rfl: 0 .  levothyroxine (SYNTHROID, LEVOTHROID) 50 MCG tablet, Take 50 mcg by mouth daily before breakfast., Disp: , Rfl:  .  NIFEdipine (ADALAT CC) 30 MG 24 hr tablet, Take 1 tablet (30 mg total) by mouth daily., Disp: 30 tablet, Rfl: 0 .  omeprazole (PRILOSEC) 20 MG capsule, Take 20 mg by mouth daily., Disp: , Rfl:  .  polyethylene glycol (MIRALAX / GLYCOLAX) packet, Take 17 g by mouth 2 (two) times daily., Disp: , Rfl: 0 .  predniSONE (DELTASONE) 5 MG tablet, Take 1 tablet (5 mg total) by mouth daily with breakfast., Disp: 20 tablet, Rfl: 0 .  QUEtiapine (SEROQUEL) 300 MG tablet, Take 3 tablets (900 mg total) by mouth at bedtime., Disp: 180 tablet, Rfl: 1 .  sennosides (SENOKOT) 8.8 MG/5ML syrup, Take 5 mLs by mouth 2 (two) times daily., Disp: 240 mL, Rfl: 0 .  sodium bicarbonate 650 MG tablet, Take 1,300 mg by mouth 2 (two) times daily., Disp: , Rfl:  .  vitamin B-12 (CYANOCOBALAMIN) 1000 MCG tablet, Take 1,000 mcg by mouth daily., Disp: , Rfl:  .  hydrOXYzine (ATARAX/VISTARIL) 50 MG tablet, Take 1 tablet (50 mg total) by mouth 3 (three) times daily as needed., Disp: 30 tablet, Rfl: 0 .  labetalol (NORMODYNE) 200 MG tablet, Take 1 tablet (200 mg total) by mouth 2 (two) times daily. (Patient not taking: Reported on 03/16/2019), Disp: 60 tablet, Rfl: 0 .  ondansetron (ZOFRAN) 4 MG tablet, Take 1 tablet (4 mg total) by mouth every 6 (six) hours as needed for nausea. (Patient not taking: Reported on 03/16/2019), Disp: 20 tablet, Rfl: 0 .  Tiotropium Bromide Monohydrate (SPIRIVA RESPIMAT) 1.25 MCG/ACT AERS, Inhale 2 puffs into the lungs daily. (Patient not taking: Reported on 03/16/2019), Disp: 4 g, Rfl: 2 Medication Side  Effects: none  Family Medical/ Social History: Changes? No  MENTAL HEALTH EXAM:  Last menstrual period 07/07/2002.There is no height or weight on file to calculate BMI.  General Appearance: unable to assess  Eye Contact:  unable to assess  Speech:  Clear and Coherent  Volume:  Normal  Mood:  Euthymic  Affect:  unable to assess  Thought Process:  Goal Directed  Orientation:  Full (Time, Place, and Person)  Thought Content: Logical   Suicidal Thoughts:  No  Homicidal Thoughts:  No  Memory:  WNL  Judgement:  Good  Insight:  Good  Psychomotor Activity:  unable to assess  Concentration:  Concentration: Good  Recall:  Good  Fund of Knowledge: Good  Language: Good  Assets:  Desire for Improvement  ADL's:  Intact  Cognition: WNL  Prognosis:  Good    DIAGNOSES:    ICD-10-CM   1. Bipolar I disorder ([place])  F31.9   2. Generalized anxiety disorder  F41.1   3. Insomnia, unspecified type  G47.00   4. Situational depression  F43.21     Receiving Psychotherapy: No    RECOMMENDATIONS:  Increase Seroquel 300 mg, to 3 pills ( 900 mg) nightly.  She is aware that the usual max dose is 800 mg, although I have seen as high as 1000 mg.  She has at least 100 pills of 300 mg tablets so I want to use those up before sending in a prescription.  Depending on how she does at this dose, I may go down to 800 mg or we may just leave it the same if she is doing well. Continue Lexapro 10 mg, 1.5 pills daily. Increase hydroxyzine to 50 mg, 1 3 times daily as needed. Recommend counseling. Return in 4 to 6 weeks.  Donnal Moat, PA-C   This record has been created using [employer].  Chart creation errors have been sought, but may not always have been located and corrected. Such creation errors do not reflect on the standard of medical care.

## 2019-03-22 ENCOUNTER — Encounter (HOSPITAL_COMMUNITY): Payer: BC Managed Care – PPO

## 2019-03-29 ENCOUNTER — Other Ambulatory Visit: Payer: Self-pay

## 2019-03-29 ENCOUNTER — Ambulatory Visit (HOSPITAL_COMMUNITY)
Admission: RE | Admit: 2019-03-29 | Discharge: 2019-03-29 | Disposition: A | Discharge status: Home / Self Care | Payer: BC Managed Care – PPO | Source: Ambulatory Visit | Attending: Nephrology | Admitting: Nephrology

## 2019-03-29 VITALS — BP 155/103 | HR 104 | Resp 18

## 2019-03-29 DIAGNOSIS — N183 Chronic kidney disease, stage 3 unspecified: Secondary | ICD-10-CM

## 2019-03-29 LAB — POCT HEMOGLOBIN-HEMACUE: Hemoglobin: 13.6 g/dL (ref 12.0–15.0)

## 2019-03-29 MED ORDER — EPOETIN ALFA-EPBX 40000 UNIT/ML IJ SOLN
30000.0000 [IU] | INTRAMUSCULAR | Status: DC
  Filled 2019-03-29: qty 1

## 2019-04-04 IMAGING — DX DG CHEST 1V PORT
1 series · 1 of 1 positions shown · non-contrast
Comparison: 09/04/2018

CLINICAL DATA: Acute respiratory failure

EXAM:
PORTABLE CHEST 1 VIEW

[chest ap]
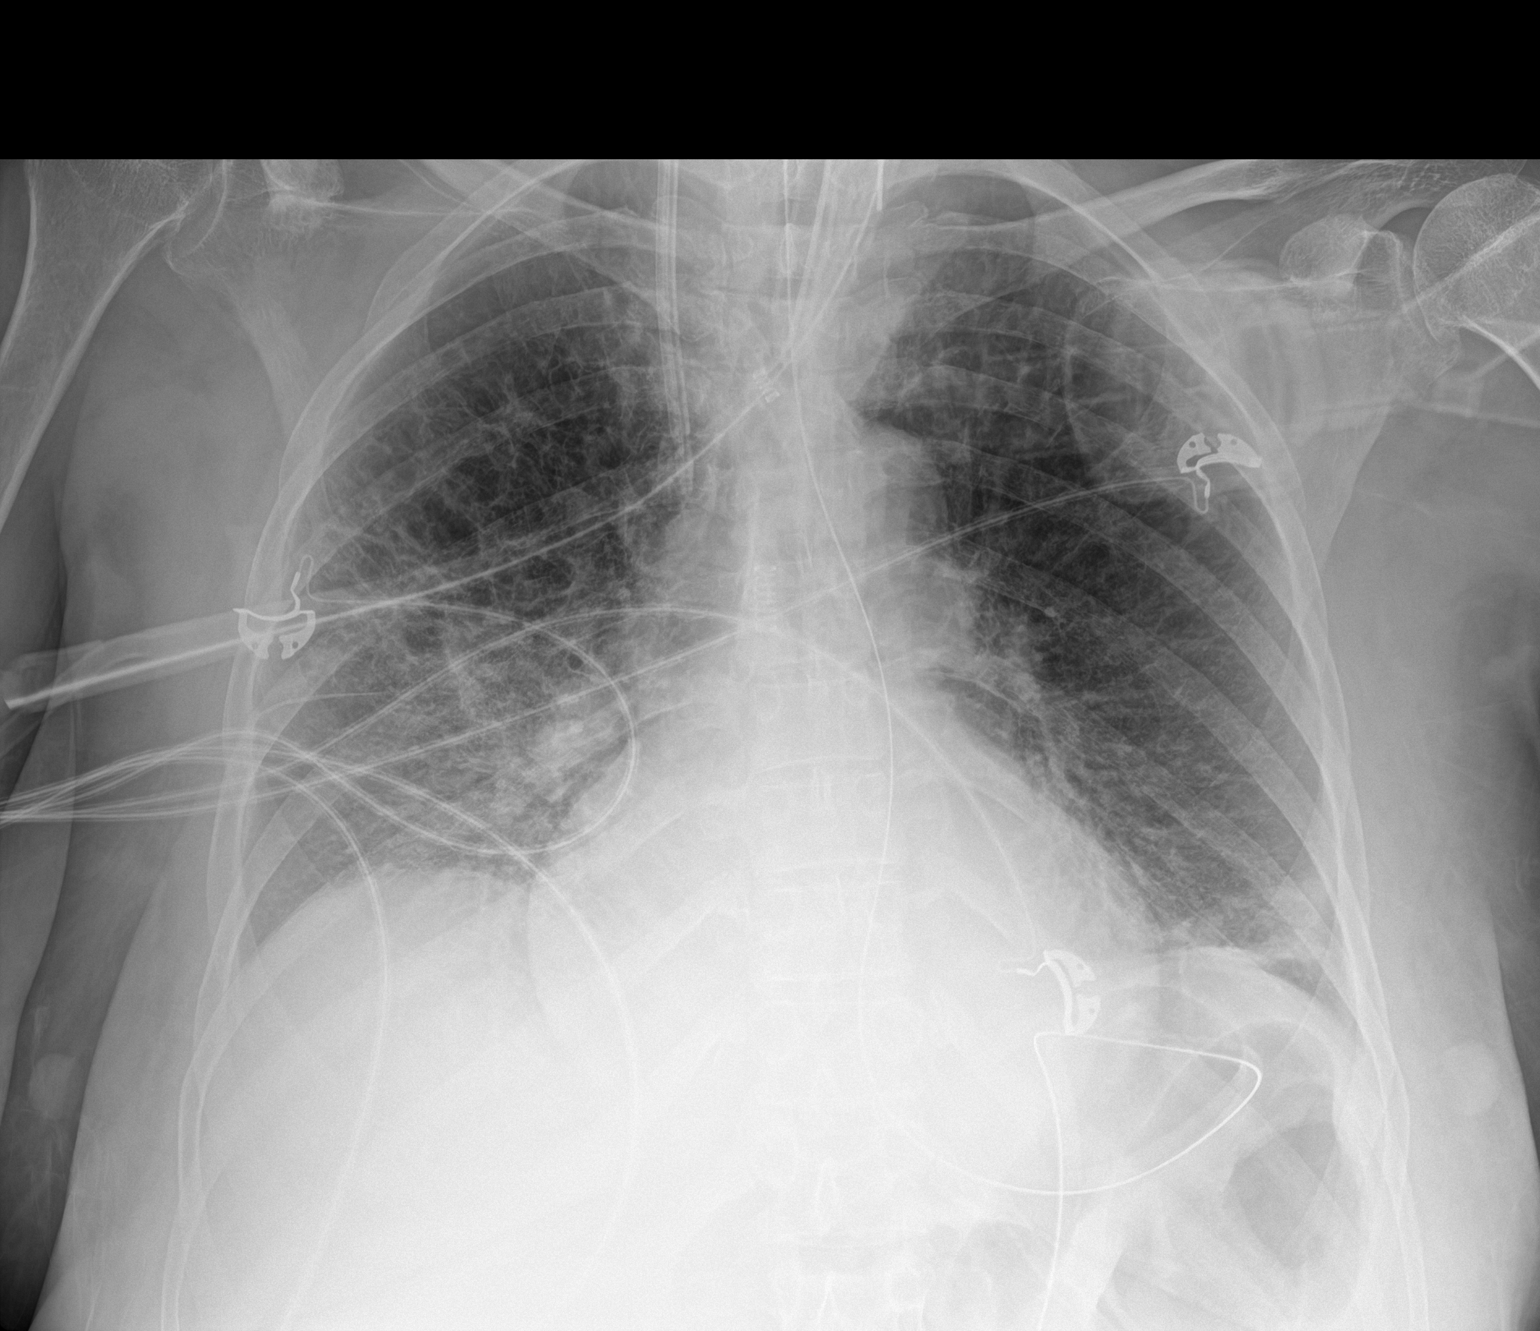

[1 of 1 positions shown; findings below may reference images not displayed]

FINDINGS: Endotracheal tube and gastric catheter are again seen and stable.
Right jugular central line is again noted and stable. Lungs are well
aerated bilaterally. Persistent right basilar interstitial opacities
are noted. Slight increase in left basilar atelectasis is seen when
compared with the prior exam.
IMPRESSION: Stable opacities in the right lung base with slight increase in left
basilar atelectasis.

## 2019-04-05 ENCOUNTER — Encounter (HOSPITAL_COMMUNITY): Payer: BC Managed Care – PPO

## 2019-04-07 ENCOUNTER — Other Ambulatory Visit: Payer: Self-pay | Admitting: Physician Assistant

## 2019-04-19 ENCOUNTER — Other Ambulatory Visit: Payer: Self-pay

## 2019-04-19 ENCOUNTER — Ambulatory Visit (HOSPITAL_COMMUNITY)
Admission: RE | Admit: 2019-04-19 | Discharge: 2019-04-19 | Disposition: A | Discharge status: Home / Self Care | Payer: BC Managed Care – PPO | Source: Ambulatory Visit | Attending: Nephrology | Admitting: Nephrology

## 2019-04-19 DIAGNOSIS — D631 Anemia in chronic kidney disease: Secondary | ICD-10-CM | POA: Diagnosis not present

## 2019-04-19 DIAGNOSIS — N183 Chronic kidney disease, stage 3 unspecified: Secondary | ICD-10-CM | POA: Diagnosis not present

## 2019-04-19 LAB — POCT HEMOGLOBIN-HEMACUE: Hemoglobin: 13.9 g/dL (ref 12.0–15.0)

## 2019-04-19 MED ORDER — EPOETIN ALFA-EPBX 40000 UNIT/ML IJ SOLN
30000.0000 [IU] | INTRAMUSCULAR | Status: DC
  Filled 2019-04-19: qty 1

## 2019-04-20 ENCOUNTER — Other Ambulatory Visit: Payer: Self-pay | Admitting: Gastroenterology

## 2019-05-03 ENCOUNTER — Other Ambulatory Visit: Payer: Self-pay | Admitting: Physician Assistant

## 2019-05-03 ENCOUNTER — Encounter (HOSPITAL_COMMUNITY): Payer: BC Managed Care – PPO

## 2019-05-06 ENCOUNTER — Encounter (HOSPITAL_COMMUNITY): Payer: Self-pay

## 2019-05-06 ENCOUNTER — Ambulatory Visit (HOSPITAL_COMMUNITY): Admit: 2019-05-06 | Payer: BC Managed Care – PPO | Admitting: Gastroenterology

## 2019-05-06 SURGERY — COLONOSCOPY WITH PROPOFOL
Anesthesia: Monitor Anesthesia Care

## 2019-05-10 ENCOUNTER — Encounter (HOSPITAL_COMMUNITY): Payer: BC Managed Care – PPO

## 2019-05-20 ENCOUNTER — Other Ambulatory Visit: Payer: Self-pay

## 2019-05-20 DIAGNOSIS — Z20822 Contact with and (suspected) exposure to covid-19: Secondary | ICD-10-CM

## 2019-05-24 ENCOUNTER — Encounter (HOSPITAL_COMMUNITY): Payer: BC Managed Care – PPO

## 2019-05-24 LAB — NOVEL CORONAVIRUS, NAA: SARS-CoV-2, NAA: NOT DETECTED

## 2019-06-01 ENCOUNTER — Other Ambulatory Visit: Payer: Self-pay | Admitting: Physician Assistant

## 2019-06-06 ENCOUNTER — Other Ambulatory Visit: Payer: Self-pay | Admitting: Physician Assistant

## 2019-06-08 ENCOUNTER — Other Ambulatory Visit: Payer: Self-pay | Admitting: Physician Assistant

## 2019-06-20 ENCOUNTER — Other Ambulatory Visit: Payer: Self-pay

## 2019-06-20 ENCOUNTER — Telehealth: Payer: Self-pay | Admitting: Physician Assistant

## 2019-06-20 MED ORDER — HYDROXYZINE HCL 50 MG PO TABS: 50.0000 mg | ORAL_TABLET | Freq: Three times a day (TID) | ORAL | 0 refills | Status: DC | PRN

## 2019-06-20 NOTE — Telephone Encounter (Signed)
Anna [name] called to request refill of her hydroxyzine and seroquel.  I told her she should Seroquel because the last prescription was sent in 06/01/19.  If she hasn't picked that up yet, the pharmacy should have it.  But she doesn't have hydroxyzine.  Please send to [facility].  Appt 06/24/19

## 2019-06-20 NOTE — Telephone Encounter (Signed)
Refill submitted. 

## 2019-06-24 ENCOUNTER — Ambulatory Visit (INDEPENDENT_AMBULATORY_CARE_PROVIDER_SITE_OTHER): Payer: BC Managed Care – PPO | Admitting: Physician Assistant

## 2019-06-24 ENCOUNTER — Other Ambulatory Visit: Payer: Self-pay

## 2019-06-24 ENCOUNTER — Encounter: Payer: Self-pay | Admitting: Physician Assistant

## 2019-06-24 DIAGNOSIS — G4700 Insomnia, unspecified: Secondary | ICD-10-CM

## 2019-06-24 DIAGNOSIS — F319 Bipolar disorder, unspecified: Secondary | ICD-10-CM | POA: Diagnosis not present

## 2019-06-24 DIAGNOSIS — F411 Generalized anxiety disorder: Secondary | ICD-10-CM

## 2019-06-24 MED ORDER — ESCITALOPRAM OXALATE 20 MG PO TABS: 20.0000 mg | ORAL_TABLET | Freq: Every day | ORAL | 1 refills | Status: DC

## 2019-06-24 MED ORDER — QUETIAPINE FUMARATE 300 MG PO TABS: 900.0000 mg | ORAL_TABLET | Freq: Every day | ORAL | 2 refills | Status: DC

## 2019-06-24 NOTE — Progress Notes (Signed)
[facility]  Patient ID: Anna [name],  MRN: [id number]  PCP: [name], MD  Date of Evaluation: 06/24/2019 Time spent:15 minutes  Chief Complaint:  Chief Complaint    Follow-up      HISTORY/CURRENT STATUS: HPI for routine med check.  States she is doing well for the most part.  Her son is in a nursing home in [place] and due to the pandemic, she has not been able to see him since this past winter.  It is been hard but she states it is comforting to know that he is safe.  He has cerebral palsy and will be 59 years old in a few weeks.  Anna [name] is doing well as far as the anxiety goes.  The hydroxyzine is very helpful.  She is happy with all of her medications.  She sleeps well.  She is not working.  Patient denies loss of interest in usual activities and is able to enjoy things.  Denies decreased energy or motivation.  Appetite has not changed.  No extreme sadness, tearfulness, or feelings of hopelessness.  Denies any changes in concentration, making decisions or remembering things.  Denies suicidal or homicidal thoughts.  Patient denies increased energy with decreased need for sleep, no increased talkativeness, no racing thoughts, no impulsivity or risky behaviors, no increased spending, no increased libido, no grandiosity.  Denies dizziness, syncope, seizures, numbness, tingling, tremor, tics, unsteady gait, slurred speech, confusion. Denies muscle or joint pain, stiffness, or dystonia.  Individual Medical History/ Review of Systems: Changes? :Yes had shingles since LOV.  Also had severe constipation and has a colonoscopy in mid January.   Past medications for mental health diagnoses include: Valium and others but unknown names  Allergies: Patient has no known allergies.  Current Medications:  Current Outpatient Medications:  .  aspirin EC 325 MG tablet, Take 325 mg by mouth daily., Disp: , Rfl:  .  atorvastatin (LIPITOR) 20 MG tablet, Take 20 mg by  mouth daily., Disp: , Rfl:  .  calcitRIOL (ROCALTROL) 0.25 MCG capsule, Take 0.25 mcg by mouth daily., Disp: , Rfl: 6 .  escitalopram (LEXAPRO) 20 MG tablet, Take 1 tablet (20 mg total) by mouth daily for 1 dose., Disp: 135 tablet, Rfl: 1 .  ferrous sulfate 325 (65 FE) MG tablet, Take 1 tablet (325 mg total) by mouth daily., Disp: 30 tablet, Rfl: 3 .  folic acid (FOLVITE) 1 MG tablet, Take 1 mg by mouth daily., Disp: , Rfl:  .  hydroxychloroquine (PLAQUENIL) 200 MG tablet, Take 200 mg by mouth daily. , Disp: , Rfl: 0 .  hydrOXYzine (ATARAX/VISTARIL) 50 MG tablet, Take 1 tablet (50 mg total) by mouth 3 (three) times daily as needed., Disp: 30 tablet, Rfl: 0 .  levothyroxine (SYNTHROID, LEVOTHROID) 50 MCG tablet, Take 50 mcg by mouth daily before breakfast., Disp: , Rfl:  .  NIFEdipine (ADALAT CC) 30 MG 24 hr tablet, Take 1 tablet (30 mg total) by mouth daily., Disp: 30 tablet, Rfl: 0 .  omeprazole (PRILOSEC) 20 MG capsule, Take 20 mg by mouth daily., Disp: , Rfl:  .  polyethylene glycol (MIRALAX / GLYCOLAX) packet, Take 17 g by mouth 2 (two) times daily., Disp: , Rfl: 0 .  predniSONE (DELTASONE) 5 MG tablet, Take 1 tablet (5 mg total) by mouth daily with breakfast., Disp: 20 tablet, Rfl: 0 .  QUEtiapine (SEROQUEL) 300 MG tablet, Take 3 tablets (900 mg total) by mouth at bedtime., Disp: 90 tablet, Rfl: 2 .  sennosides (  SENOKOT) 8.8 MG/5ML syrup, Take 5 mLs by mouth 2 (two) times daily., Disp: 240 mL, Rfl: 0 .  sodium bicarbonate 650 MG tablet, Take 1,300 mg by mouth 2 (two) times daily., Disp: , Rfl:  .  vitamin B-12 (CYANOCOBALAMIN) 1000 MCG tablet, Take 1,000 mcg by mouth daily., Disp: , Rfl:  .  labetalol (NORMODYNE) 200 MG tablet, Take 1 tablet (200 mg total) by mouth 2 (two) times daily. (Patient not taking: Reported on 03/16/2019), Disp: 60 tablet, Rfl: 0 .  ondansetron (ZOFRAN) 4 MG tablet, Take 1 tablet (4 mg total) by mouth every 6 (six) hours as needed for nausea. (Patient not taking: Reported  on 03/16/2019), Disp: 20 tablet, Rfl: 0 .  Tiotropium Bromide Monohydrate (SPIRIVA RESPIMAT) 1.25 MCG/ACT AERS, Inhale 2 puffs into the lungs daily. (Patient not taking: Reported on 03/16/2019), Disp: 4 g, Rfl: 2 Medication Side Effects: none  Family Medical/ Social History: Changes? No  MENTAL HEALTH EXAM:  Last menstrual period 07/07/2002.There is no height or weight on file to calculate BMI.  General Appearance: Casual, Neat and Well Groomed  Eye Contact:  Good  Speech:  Clear and Coherent  Volume:  Normal  Mood:  Euthymic  Affect:  Appropriate  Thought Process:  Goal Directed and Descriptions of Associations: Intact  Orientation:  Full (Time, Place, and Person)  Thought Content: Logical   Suicidal Thoughts:  No  Homicidal Thoughts:  No  Memory:  WNL  Judgement:  Good  Insight:  Good  Psychomotor Activity:  Normal  Concentration:  Concentration: Good  Recall:  Good  Fund of Knowledge: Good  Language: Good  Assets:  Desire for Improvement  ADL's:  Intact  Cognition: WNL  Prognosis:  Good    DIAGNOSES:    ICD-10-CM   1. Bipolar I disorder ([place])  F31.9   2. Generalized anxiety disorder  F41.1   3. Insomnia, unspecified type  G47.00     Receiving Psychotherapy: No    RECOMMENDATIONS:  I am glad to see her doing well. Continue Lexapro 20 mg daily. Continue hydroxyzine 50 mg 3 times daily as needed. Continue Seroquel 300 mg, 3 p.o. nightly. Continue multivitamin, B12, vitamin D, fish oil. Return in 3 months.  Donnal Moat, PA-C

## 2019-07-21 ENCOUNTER — Telehealth: Payer: Self-pay

## 2019-07-21 NOTE — Telephone Encounter (Signed)
Patient was approved for QUEtiapine Fumarate 300 Mg or Tabs by [facility] 06/27/2019-06/25/2022.

## 2019-07-28 ENCOUNTER — Other Ambulatory Visit: Payer: Self-pay | Admitting: Physician Assistant

## 2019-07-29 ENCOUNTER — Telehealth: Payer: Self-pay

## 2019-07-29 NOTE — Telephone Encounter (Signed)
Prior authorization submitted and approved for Quetiapine 300 mg take 3 tablets at hs, effective 07/28/2019-07/26/2022 through [facility] ID# [id number]

## 2019-09-22 ENCOUNTER — Encounter: Payer: Self-pay | Admitting: Physician Assistant

## 2019-09-22 ENCOUNTER — Other Ambulatory Visit: Payer: Self-pay

## 2019-09-22 ENCOUNTER — Ambulatory Visit (INDEPENDENT_AMBULATORY_CARE_PROVIDER_SITE_OTHER): Payer: BC Managed Care – PPO | Admitting: Physician Assistant

## 2019-09-22 DIAGNOSIS — G4700 Insomnia, unspecified: Secondary | ICD-10-CM

## 2019-09-22 DIAGNOSIS — F319 Bipolar disorder, unspecified: Secondary | ICD-10-CM

## 2019-09-22 DIAGNOSIS — F411 Generalized anxiety disorder: Secondary | ICD-10-CM

## 2019-09-22 MED ORDER — QUETIAPINE FUMARATE 300 MG PO TABS: 900.0000 mg | ORAL_TABLET | Freq: Every day | ORAL | 5 refills | Status: DC

## 2019-09-22 MED ORDER — ESCITALOPRAM OXALATE 20 MG PO TABS: 30.0000 mg | ORAL_TABLET | Freq: Every day | ORAL | 5 refills | Status: DC

## 2019-09-22 MED ORDER — HYDROXYZINE HCL 50 MG PO TABS: 50.0000 mg | ORAL_TABLET | Freq: Three times a day (TID) | ORAL | 5 refills | Status: AC | PRN

## 2019-09-22 NOTE — Progress Notes (Signed)
[facility]  Patient ID: Anna [name],  MRN: [id number]  PCP: [name], MD  Date of Evaluation: 09/22/2019 Time spent:20 minutes  Chief Complaint:  Chief Complaint    Anxiety; Depression      HISTORY/CURRENT STATUS: HPI for routine med check.  States she is doing well for the most part.  The anxiety is bad sometimes  The hydroxyzine is very helpful but 30 pills a month is not enough.  She needs it more than once a day.  She sleeps well.  She is not working.  Patient denies loss of interest in usual activities and is able to enjoy things.  Denies decreased energy or motivation.  Appetite has not changed.  No extreme sadness, tearfulness, or feelings of hopelessness.  There was confusion about the Lexapro dose.  She is taking 20 mg, 1.5 pills daily and states it is effective.  She denies suicidal or homicidal thoughts.  Patient denies increased energy with decreased need for sleep, no increased talkativeness, no racing thoughts, no impulsivity or risky behaviors, no increased spending, no increased libido, no grandiosity.  Denies dizziness, syncope, seizures, numbness, tingling, tremor, tics, unsteady gait, slurred speech, confusion. Denies muscle or joint pain, stiffness, or dystonia.  Individual Medical History/ Review of Systems: Changes? :No    Past medications for mental health diagnoses include: Valium and unknown others  Allergies: Patient has no known allergies.  Current Medications:  Current Outpatient Medications:  .  atorvastatin (LIPITOR) 20 MG tablet, Take 20 mg by mouth daily., Disp: , Rfl:  .  calcitRIOL (ROCALTROL) 0.25 MCG capsule, Take 0.25 mcg by mouth daily., Disp: , Rfl: 6 .  escitalopram (LEXAPRO) 20 MG tablet, Take 1.5 tablets (30 mg total) by mouth daily., Disp: 45 tablet, Rfl: 5 .  folic acid (FOLVITE) 1 MG tablet, Take 1 mg by mouth daily., Disp: , Rfl:  .  hydroxychloroquine (PLAQUENIL) 200 MG tablet, Take 200 mg by mouth daily.  , Disp: , Rfl: 0 .  hydrOXYzine (ATARAX/VISTARIL) 50 MG tablet, Take 1 tablet (50 mg total) by mouth 3 (three) times daily as needed., Disp: 60 tablet, Rfl: 5 .  levothyroxine (SYNTHROID, LEVOTHROID) 50 MCG tablet, Take 50 mcg by mouth daily before breakfast., Disp: , Rfl:  .  NIFEdipine (ADALAT CC) 30 MG 24 hr tablet, Take 1 tablet (30 mg total) by mouth daily., Disp: 30 tablet, Rfl: 0 .  omeprazole (PRILOSEC) 20 MG capsule, Take 20 mg by mouth daily., Disp: , Rfl:  .  polyethylene glycol (MIRALAX / GLYCOLAX) packet, Take 17 g by mouth 2 (two) times daily., Disp: , Rfl: 0 .  predniSONE (DELTASONE) 5 MG tablet, Take 1 tablet (5 mg total) by mouth daily with breakfast., Disp: 20 tablet, Rfl: 0 .  QUEtiapine (SEROQUEL) 300 MG tablet, Take 3 tablets (900 mg total) by mouth at bedtime., Disp: 90 tablet, Rfl: 5 .  sennosides (SENOKOT) 8.8 MG/5ML syrup, Take 5 mLs by mouth 2 (two) times daily., Disp: 240 mL, Rfl: 0 .  sodium bicarbonate 650 MG tablet, Take 1,300 mg by mouth 2 (two) times daily., Disp: , Rfl:  .  vitamin B-12 (CYANOCOBALAMIN) 1000 MCG tablet, Take 1,000 mcg by mouth daily., Disp: , Rfl:  .  ferrous sulfate 325 (65 FE) MG tablet, Take 1 tablet (325 mg total) by mouth daily., Disp: 30 tablet, Rfl: 3 .  labetalol (NORMODYNE) 200 MG tablet, Take 1 tablet (200 mg total) by mouth 2 (two) times daily. (Patient not taking: Reported on  03/16/2019), Disp: 60 tablet, Rfl: 0 .  ondansetron (ZOFRAN) 4 MG tablet, Take 1 tablet (4 mg total) by mouth every 6 (six) hours as needed for nausea. (Patient not taking: Reported on 03/16/2019), Disp: 20 tablet, Rfl: 0 .  Tiotropium Bromide Monohydrate (SPIRIVA RESPIMAT) 1.25 MCG/ACT AERS, Inhale 2 puffs into the lungs daily. (Patient not taking: Reported on 03/16/2019), Disp: 4 g, Rfl: 2 Medication Side Effects: none  Family Medical/ Social History: Changes? Her aunt died, and an uncle died since her Irene:  Last menstrual period 07/07/2002.There  is no height or weight on file to calculate BMI.  General Appearance: Casual, Neat and Well Groomed  Eye Contact:  Good  Speech:  Clear and Coherent  Volume:  Normal  Mood:  Euthymic  Affect:  Appropriate  Thought Process:  Goal Directed and Descriptions of Associations: Intact  Orientation:  Full (Time, Place, and Person)  Thought Content: Logical   Suicidal Thoughts:  No  Homicidal Thoughts:  No  Memory:  WNL  Judgement:  Good  Insight:  Good  Psychomotor Activity:  Normal  Concentration:  Concentration: Good  Recall:  Good  Fund of Knowledge: Good  Language: Good  Assets:  Desire for Improvement  ADL's:  Intact  Cognition: WNL  Prognosis:  Good    DIAGNOSES:    ICD-10-CM   1. Bipolar I disorder ([place])  F31.9   2. Generalized anxiety disorder  F41.1   3. Insomnia, unspecified type  G47.00     Receiving Psychotherapy: No    RECOMMENDATIONS:  PDMP was reviewed. Continue Lexapro 20 mg, 1.5 pills daily.  Continue hydroxyzine 50 mg 3 times daily as needed.  I have increased the quantity on her prescription so that she can have it more often if needed. Continue Seroquel 300 mg, 3 p.o. nightly. Continue multivitamin, B12, vitamin D, fish oil. Return in 3 months.  Donnal Moat, PA-C

## 2019-10-24 ENCOUNTER — Other Ambulatory Visit: Payer: Self-pay | Admitting: Physician Assistant

## 2019-12-21 ENCOUNTER — Ambulatory Visit: Payer: BC Managed Care – PPO | Admitting: Physician Assistant
